# Patient Record
Sex: Male | Born: 1940 | Race: White | Hispanic: No | Marital: Single | Smoking: Former smoker
Health system: Southern US, Community
[De-identification: ages and names within clinical notes are randomized; demographics above are authoritative.]

## PROBLEM LIST (undated history)

## (undated) DIAGNOSIS — J309 Allergic rhinitis, unspecified: Secondary | ICD-10-CM

## (undated) DIAGNOSIS — K219 Gastro-esophageal reflux disease without esophagitis: Secondary | ICD-10-CM

## (undated) DIAGNOSIS — H409 Unspecified glaucoma: Secondary | ICD-10-CM

## (undated) DIAGNOSIS — D701 Agranulocytosis secondary to cancer chemotherapy: Secondary | ICD-10-CM

## (undated) DIAGNOSIS — IMO0001 Reserved for inherently not codable concepts without codable children: Secondary | ICD-10-CM

## (undated) DIAGNOSIS — K810 Acute cholecystitis: Secondary | ICD-10-CM

## (undated) DIAGNOSIS — J189 Pneumonia, unspecified organism: Secondary | ICD-10-CM

## (undated) DIAGNOSIS — J449 Chronic obstructive pulmonary disease, unspecified: Secondary | ICD-10-CM

## (undated) DIAGNOSIS — I10 Essential (primary) hypertension: Secondary | ICD-10-CM

## (undated) DIAGNOSIS — C3490 Malignant neoplasm of unspecified part of unspecified bronchus or lung: Secondary | ICD-10-CM

## (undated) DIAGNOSIS — N400 Enlarged prostate without lower urinary tract symptoms: Secondary | ICD-10-CM

## (undated) DIAGNOSIS — F32A Depression, unspecified: Secondary | ICD-10-CM

## (undated) DIAGNOSIS — N390 Urinary tract infection, site not specified: Secondary | ICD-10-CM

## (undated) DIAGNOSIS — M1990 Unspecified osteoarthritis, unspecified site: Secondary | ICD-10-CM

## (undated) DIAGNOSIS — T451X5A Adverse effect of antineoplastic and immunosuppressive drugs, initial encounter: Secondary | ICD-10-CM

## (undated) DIAGNOSIS — F1110 Opioid abuse, uncomplicated: Secondary | ICD-10-CM

## (undated) DIAGNOSIS — D649 Anemia, unspecified: Secondary | ICD-10-CM

## (undated) DIAGNOSIS — F329 Major depressive disorder, single episode, unspecified: Secondary | ICD-10-CM

## (undated) HISTORY — DX: Benign prostatic hyperplasia without lower urinary tract symptoms: N40.0

## (undated) HISTORY — PX: EYE SURGERY: SHX253

## (undated) HISTORY — DX: Chronic obstructive pulmonary disease, unspecified: J44.9

## (undated) HISTORY — DX: Gastro-esophageal reflux disease without esophagitis: K21.9

## (undated) HISTORY — PX: HERNIA REPAIR: SHX51

## (undated) HISTORY — DX: Essential (primary) hypertension: I10

## (undated) HISTORY — PX: INGUINAL HERNIA REPAIR: SUR1180

---

## 1998-04-22 ENCOUNTER — Other Ambulatory Visit: Admission: RE | Admit: 1998-04-22 | Discharge: 1998-04-22 | Payer: Self-pay | Admitting: *Deleted

## 1999-08-25 ENCOUNTER — Encounter: Payer: Self-pay | Admitting: *Deleted

## 1999-08-25 ENCOUNTER — Ambulatory Visit (HOSPITAL_COMMUNITY): Admission: RE | Admit: 1999-08-25 | Discharge: 1999-08-25 | Payer: Self-pay | Admitting: *Deleted

## 2000-05-01 ENCOUNTER — Inpatient Hospital Stay (HOSPITAL_COMMUNITY): Admission: EM | Admit: 2000-05-01 | Discharge: 2000-05-02 | Payer: Self-pay | Admitting: Emergency Medicine

## 2000-05-01 ENCOUNTER — Encounter: Payer: Self-pay | Admitting: *Deleted

## 2000-05-02 ENCOUNTER — Inpatient Hospital Stay (HOSPITAL_COMMUNITY): Admission: AD | Admit: 2000-05-02 | Discharge: 2000-05-04 | Payer: Self-pay | Admitting: Psychiatry

## 2001-01-12 ENCOUNTER — Ambulatory Visit (HOSPITAL_COMMUNITY): Admission: RE | Admit: 2001-01-12 | Discharge: 2001-01-12 | Payer: Self-pay | Admitting: Cardiovascular Disease

## 2001-05-20 ENCOUNTER — Encounter: Payer: Self-pay | Admitting: *Deleted

## 2001-05-20 ENCOUNTER — Emergency Department (HOSPITAL_COMMUNITY): Admission: EM | Admit: 2001-05-20 | Discharge: 2001-05-20 | Payer: Self-pay | Admitting: *Deleted

## 2002-03-14 ENCOUNTER — Encounter: Admission: RE | Admit: 2002-03-14 | Discharge: 2002-03-14 | Payer: Self-pay | Admitting: *Deleted

## 2002-03-18 ENCOUNTER — Encounter: Admission: RE | Admit: 2002-03-18 | Discharge: 2002-03-18 | Payer: Self-pay | Admitting: *Deleted

## 2003-11-01 HISTORY — PX: ANKLE SURGERY: SHX546

## 2004-02-23 ENCOUNTER — Ambulatory Visit (HOSPITAL_BASED_OUTPATIENT_CLINIC_OR_DEPARTMENT_OTHER): Admission: RE | Admit: 2004-02-23 | Discharge: 2004-02-23 | Payer: Self-pay | Admitting: Orthopedic Surgery

## 2004-02-23 ENCOUNTER — Ambulatory Visit (HOSPITAL_COMMUNITY): Admission: RE | Admit: 2004-02-23 | Discharge: 2004-02-23 | Payer: Self-pay | Admitting: Orthopedic Surgery

## 2004-12-07 ENCOUNTER — Encounter: Admission: RE | Admit: 2004-12-07 | Discharge: 2004-12-31 | Payer: Self-pay | Admitting: Orthopedic Surgery

## 2005-12-30 ENCOUNTER — Ambulatory Visit (HOSPITAL_COMMUNITY): Admission: RE | Admit: 2005-12-30 | Discharge: 2005-12-30 | Payer: Self-pay | Admitting: Family Medicine

## 2006-08-22 ENCOUNTER — Inpatient Hospital Stay (HOSPITAL_COMMUNITY): Admission: EM | Admit: 2006-08-22 | Discharge: 2006-08-24 | Payer: Self-pay | Admitting: Emergency Medicine

## 2008-11-11 ENCOUNTER — Ambulatory Visit (HOSPITAL_COMMUNITY): Admission: RE | Admit: 2008-11-11 | Discharge: 2008-11-11 | Payer: Self-pay | Admitting: Family Medicine

## 2008-11-11 ENCOUNTER — Ambulatory Visit: Payer: Self-pay | Admitting: Vascular Surgery

## 2008-11-11 ENCOUNTER — Encounter (INDEPENDENT_AMBULATORY_CARE_PROVIDER_SITE_OTHER): Payer: Self-pay | Admitting: Orthopedic Surgery

## 2010-11-22 LAB — SURGICAL PCR SCREEN: Staphylococcus aureus: NEGATIVE

## 2010-11-22 LAB — CBC
HCT: 39.6 % (ref 39.0–52.0)
RBC: 4.17 MIL/uL — ABNORMAL LOW (ref 4.22–5.81)
RDW: 13.3 % (ref 11.5–15.5)
WBC: 6.1 10*3/uL (ref 4.0–10.5)

## 2010-11-22 LAB — BASIC METABOLIC PANEL
Chloride: 104 mEq/L (ref 96–112)
GFR calc non Af Amer: >60 mL/min (ref >60)
Glucose, Bld: 83 mg/dL (ref 70–99)
Potassium: 4.6 mEq/L (ref 3.5–5.1)
Sodium: 136 mEq/L (ref 135–145)

## 2010-11-25 ENCOUNTER — Inpatient Hospital Stay (HOSPITAL_COMMUNITY)
Admission: RE | Admit: 2010-11-25 | Discharge: 2010-11-26 | Payer: Self-pay | Source: Home / Self Care | Attending: Urology | Admitting: Urology

## 2010-11-25 HISTORY — PX: PROSTATE SURGERY: SHX751

## 2010-11-25 LAB — BASIC METABOLIC PANEL
CO2: 28 mEq/L (ref 19–32)
Calcium: 8.7 mg/dL (ref 8.4–10.5)
GFR calc Af Amer: >60 mL/min (ref >60)
GFR calc non Af Amer: >60 mL/min (ref >60)
Potassium: 4.7 mEq/L (ref 3.5–5.1)
Sodium: 140 mEq/L (ref 135–145)

## 2010-12-27 NOTE — Op Note (Signed)
NAME:  Tyler [name], Tyler [name]             ACCOUNT NO.:  [id number]  MEDICAL RECORD NO.:  [id number]          PATIENT TYPE:  INP  LOCATION:  0001                         FACILITY:  [facility]  PHYSICIAN:  Abisola Carrero I. Patsi Sears, M.D.DATE OF BIRTH:  [birth year]  DATE OF PROCEDURE:  11/25/2010 DATE OF DISCHARGE:                              OPERATIVE REPORT   PREOPERATIVE DIAGNOSIS:  Severe symptomatic benign prostatic hypertrophy with large nest of penile superficial warts.  POSTOPERATIVE DIAGNOSIS:  Severe symptomatic benign prostatic hypertrophy with large nest of penile superficial warts.  OPERATION: 1. Excision of penile base warts (6 cm). 2. Cystoscopy. 3. Transurethral resection of prostate.  SURGEON:  Rannie Craney I. Patsi Sears, M.D.  ANESTHESIA:  General LMA (attempted and failed spinal).  PREPARATION:  After appropriate preanesthesia, the patient was brought to the operating room, placed on the operating room table in the left lateral decubitus position where spinal anesthetic was attempted, but failed.  He was therefore replaced in the dorsal supine position where general LMA anesthesia was introduced.  Following this, he was replaced in dorsal lithotomy position where the pubis was prepped with Betadine solution and draped in usual fashion.  HISTORY:  The patient is a 70 year old male with a history of severe BPH, on Flomax, but with lower urinary tract symptoms (LUTS) of frequency, urgency, poor voiding pattern, with dribbling, nighttime urination.  He has an international prostate symptom score of 25/7 (max 35).  The patient, in addition, has a large nest of penile warts, at the base of the penis in the dorsal surface and these will be for removal as well.  PROCEDURE:  Attention was first directed to the large area of penile warts measured 6 cm.  Elliptical incision was made from the base of the penis and the entire area of warts was removed and sent to laboratory for  examination.  Minimal bleeding was noted and this was contained with electrosurgical unit.  The wound was closed in 2 layers with 3-0 Vicryl suture in the subcutaneous layer and running 4-0 Vicryl suture was placed.  Dermabond was placed on the skin.  Attention was then directed to the TURP.  The urethra was dilated to a size 30-French, allowing a 28 resectoscope sheath to be passed.  A bilobar BPH was identified.  No median lobe was noted.  Cystoscopy revealed normal clear efflux in both ureteral orifices.  The trigone was normal.  There was no bladder stone or tumor noted.  Trabeculation was noted.  There was no cellule formation and no diverticular formation.  Resection was begun at 7 o'clock position and carried to the 5 o'clock position.  Resection was also accomplished from the 9 o'clock position to the 6 o'clock position and from the 3 to the 6 o'clock position. Tissue was electrocoagulated to avoid bleeding.  Tissue chips were evacuated from the bladder with the piston syringe and then a size 24 hematuria catheter was placed potential for traction irrigation.  The patient is allergic to Toradol, so he was not given any of this medication, was awakened and taken to recovery room in good condition.     Bosten Newstrom I. Patsi Sears, M.D.  SIT/MEDQ  D:  11/25/2010  T:  11/25/2010  Job:  [id number]  Electronically Signed by Jethro Bolus M.D. on 12/27/2010 09:33:14 AM

## 2010-12-27 NOTE — H&P (Signed)
  NAME:  Tyler [name], Tyler [name]             ACCOUNT NO.:  [id number]  MEDICAL RECORD NO.:  [id number]          PATIENT TYPE:  INP  LOCATION:  0001                         FACILITY:  [facility]  PHYSICIAN:  Kayte Borchard I. Patsi Sears, M.D.DATE OF BIRTH:  12-Mar-1941  DATE OF ADMISSION:  11/25/2010 DATE OF DISCHARGE:                             HISTORY & PHYSICAL   HISTORY:  Tyler [name] is a 70 year old male, with a history of BPH failing tamsulosin therapy.  He complains of severe urgency, frequency, weak urinary flow and nocturia x4.  He has an International Prostate Symptom Score sheet of 31/7 with the quality of life index of 6/6, despite maximal medical therapy.  He denies hematuria or dysuria.  Note, the patient has chronic low back pain with arthritis.  He takes ibuprofen and hydrocodone 3 times a day.  His PSA in April was 0.39 per Dr. Feliciana Rossetti.  PAST HISTORY: 1. Arthritis. 2. GERD. 3. Depression. 4. Hypertension. 5. Increased alcohol use.  PAST SURGERIES: 1. Ankle surgery. 2. Hernia repair. 3. Spermatic cord repair for varicocele in 1998.  CURRENT MEDICATIONS:  Include: 1. Atenolol 50 mg per day. 2. Citalopram 20 mg per day. 3. Hydrocodone 10/650 three times a day. 4. Ibuprofen b.i.d. 5. Omeprazole 20 mg per day. 6. Prazosin 1 mg per day. 7. Tamsulosin 0.4 mg p.o. per day.  ALLERGIES: 1. TORADOL (hives, itching and swelling. 2. CODEINE (itching). 3. TRAMADOL (itching and swelling). 4. TUSSIONEX SYRUP.  FAMILY HISTORY:  Significant for liver disease (father), coronary artery disease (mother).  The patient has 1 son and 1 daughter.  He is unmarried at this time.  SOCIAL HISTORY:  The patient drinks at least 5 beers per day.  He drinks caffeine.  He is currently divorced and on disability.  He has greater than 30-pack-year history of smoking.  REVIEW OF SYSTEMS:  CONSTITUTIONAL:  Significant for urinary frequency, urgency, nocturia, difficulty starting his urinary  stream, intermittency, incomplete emptying, postvoid dribbling, inguinal pain. No dysuria, no incontinence.  Remaining review of systems is significant for chronic back pain, general ill feeling, general malaise, depression. The patient has no chronic bronchitis, no cough, no hemoptysis.  PHYSICAL EXAMINATION:  GENERAL:  A thin active male in no acute distress. ADMISSION VITAL SIGNS:  Show blood pressure 151/69, temperature 98.3, pulse is 57. NECK:  Supple, nontender.  No nodes. CHEST:  Clear to P and A. ABDOMEN:  Soft.  Positive bowel sounds without organomegaly and without masses. GENITOURINARY:  Shows normal male external genitalia. RECTAL:  Shows 4+ firm prostate but no masses and no blood. EXTREMITIES:  No cyanosis, no edema. PSYCHOLOGIC:  Shows normal orientation to time, person and place.  PLAN:  Admission chest x-ray shows left hilar mass, the patient will need chest CT with contrast for evaluation.  He is admitted via the operating room for TURP and removal of a large nest of penile wartsidentified at the base of the penis.     Gayanne Prescott I. Patsi Sears, M.D.     SIT/MEDQ  D:  11/25/2010  T:  11/25/2010  Job:  [id number]  Electronically Signed by Jethro Bolus M.D. on 12/27/2010 09:33:18 AM
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
## 2011-03-18 NOTE — H&P (Signed)
[place]  Patient:    Tyler [name], Tyler [name]                    MRN: [id number] Adm. Date:  [id number] Disc. Date: [id number] Attending:  Beryle Beams                         History and Physical  CHIEF COMPLAINT: Overdose and alcohol intoxication.  HISTORY OF PRESENT ILLNESS: This patient is a 70 year old white male, well known to me through primary care, with a history of hypertension and chronic inguinal pain, who presented on admission drunk following an overdose.  They had multiple bottles but the only thing missing from the bottles they had was Biaxin.  There were no narcotic bottles and is is likely benzodiazepine since he was found earlier to have had multiple prescriptions for benzodiazepines through multiple doctors.  I had cancelled my prescriptions for him in the past for benzodiazepines.  He denies any other complaints on admission.  He is quite inebriated and not very talkative.  He has an NG tube in place.  He denies any chest pain, shortness of breath, cold symptoms, nausea, vomiting, abdominal pain, cough, constipation, diarrhea, fever, leg cramps, or back pain.  He states that he will take care of things and will not discuss it with me.  According to EMS and his girlfriend he had told her he did not want to live another year and that this would be his last birthday (today is his birthday).  In the ER the patient received gastric lavage and charcoal.  It is still unclear, though, what his overdose medications were.  PAST MEDICAL HISTORY:  1. Hypertension.  2. Chronic inguinal pain.  3. Alcohol dependence.  4. Depression.  5. Overuse of benzodiazepines.  MEDICATIONS: He said he was not taking anything at the time other than:  1. Biaxin.  2. Tylenol.  PRESCRIBED MEDICATIONS:  1. Zyrtec.  2. Effexor XR.  3. Phenergan as-needed.  ALLERGIES: CODEINE causes nausea. SOCIAL HISTORY: The patient is recently separated from  his second wife.  Still working at a gas station.  No smoking but alcohol and drug use.  Medical drug overuse.  No IV drugs.  FAMILY HISTORY: Heart disease.  REVIEW OF SYSTEMS: As per the HPI, otherwise negative.  PHYSICAL EXAMINATION:  VITAL SIGNS: Blood pressure 135/76, pulse rate 132 and regular, respirations 18, temperature 96.1 degrees.  Pulse oximetry 98% on room air.  HEENT: Head normocephalic, atraumatic.  EOMI.  PERRLA.  Posterior pharynx clear.  Normal TMs.  NECK: Supple without adenopathy, JVD, thyromegaly, or bruits.  CHEST: Clear to auscultation and percussion, with decreased breath sounds at bilateral bases.  CARDIOVASCULAR: Regular rate without murmurs, rubs, or gallops.  ABDOMEN: Soft, nontender, nondistended.  Bowel sounds positive without hepatosplenomegaly or mass.  EXTREMITIES: Without clubbing, cyanosis, or edema.  NEUROLOGIC: Cranial nerves 2-12 intact.  Slightly sleepy.  Alert and oriented x 3.  Contracts for safety but question his reliability.  LABORATORY DATA: Laboratories are pending on admission.  ASSESSMENT/PLAN: This patient is an unfortunate 70 year old white male who overdosed on unknown substances and alcohol overnight.  He has contracted for safety with questionable reliability.  Will admit for one-on-one observation and psychiatric evaluation.  I think he will need inpatient treatment following this due to his history of alcohol use, failure to stay with the AA meetings, and now the overdose with unknown medications.  Will admit to  telemetry and follow for any arrhythmia since unknown substances, with toxicology pending.  Chest x-ray showed a slight infiltrate, likely aspiration.  Will get a better look at it with PA and lateral films and consider intravenous antibiotics.  Will hydrate the patient and keep the NG tube in overnight and remove if the patient is breathing okay in the morning and try diet. DD:  05/23/00 TD:  05/24/00 Job:  [id number]

## 2011-03-18 NOTE — Discharge Summary (Signed)
[facility]  Patient:    Tyler [name], Tyler [name]                    MRN: [id number] Adm. Date:  [id number] Disc. Date: [id number] Attending:  Marlyn Corporal Fabmy                           Discharge Summary  ADMISSION DIAGNOSES: Axis I:    1. Major depression, recurrent.            2. Alcohol dependence. Axis II:   None. Axis III:  History of hiatal hernia, low back pain, questionable lung            infiltrate. Axis IV:   Severe, psychosocial. Axis V:    GAF admission 40 and highest in past year 80.  DISCHARGE DIAGNOSES: Axis I:    1. Major depression, recurrent.            2. Alcohol dependence. Axis II:   None. Axis III:  History of hiatal hernia and low back pain. Axis IV:   Severe, psychosocial. Axis V:    GAF of 40 on admission and 70-80 on discharge.  BRIEF HISTORY:  The patient is a 70 year old three times divorced, Caucasian male who was admitted voluntarily for detoxification.  Patient was referred from [facility] and had a recent inpatient stabilization, status post overdose on opiates, Xanax, and alcohol.  He took the overdose on July 2.  On admission, the patient reported increasing depression with escalating alcohol abuse since breaking up with his girlfriend five months ago.  He also indicated that he had been having mounting financial pressures contributing to his depression.  Prior to his admission, his ability to be detoxified on outpatient service was questioned, because he has a history of multiple DWIs, detox visits at [facility] in the past, refractory to outpatient regimen.  The patient in the past refused counseling.  He has been known to abuse benzodiazepines, specifically Xanax and alcohol, obtaining Xanax from different physicians and not returning for follow-up.  It was felt that his suicide attempt and combination of opiates, Xanax, and alcohol was serious. Again, prior to his admission he was stating that he  did not want to live anymore and did not want to have any more birthdays and that he wanted this to be his last birthday.  However, he showed some indication to involve himself in counseling, abstain from the use of alcohol and wean off Xanax, and _________ and he was admitted for detoxification and management of his depression.  PERTINENT PHYSICAL AND LABORATORY DATA:  Patient was fully evaluated prior to his admission.  However, we ran a thyroid profile which showed values to be within the normal range.  COURSE IN HOSPITAL:  Patients detoxification progressed unremarkably.  There was no evidence of any withdrawal symptoms.  His affect at the time of his discharge was full and his mood was euthymic.  He was sleeping through the night.  He denied any suicidal or homicidal strivings.  He was medically compliant throughout his hospitalization.  CONDITION ON DISCHARGE:  The patient plans to stay sober and attend church and involve himself with his two grandchildren.  MEDICATIONS/FOLLOW-UP: 1. Patient was discharged with continued taper of Librium. 2. Celexa 20 mg q.d. 3. Trazodone 50 mg h.s. 4. Levaquin 500 mg q.d. for seven days.  He [facility].DD:  06/19/00 TD:  06/19/00  Job: [id number]
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
## 2011-03-18 NOTE — Discharge Summary (Signed)
Tyler [name], [name]             ACCOUNT NO.:  [id number]   MEDICAL RECORD NO.:  [id number]          PATIENT TYPE:  INP   LOCATION:  3030                         FACILITY:  [facility]   PHYSICIAN:  Hillery Aldo, M.D.   DATE OF BIRTH:  06/25/1941   DATE OF ADMISSION:  08/22/2006  DATE OF DISCHARGE:  08/24/2006                                 DISCHARGE SUMMARY   PRIMARY CARE PHYSICIAN:  Windle Guard, M.D.   DISCHARGE DIAGNOSES:  1. 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  2. Hypertension.  3. Questionable alcohol abuse.  4. Tobacco abuse  5. Chronic low back pain.  6. History of depression.  7. Gastroesophageal reflux disease.   DISCHARGE MEDICATIONS:  1. Nexium 40 mg daily.  2. Atenolol 50 mg daily.  3. Vicodin 5/325 1 tablet q.4 h p.r.n. breakthrough pain.  4. Ibuprofen 800 mg t.i.d. x2 weeks then p.r.n..  5. Flexeril 10 mg t.i.d.   CONSULTATIONS:  Dr. Coletta Memos, M.D. - Neurosurgery.   BRIEF ADMISSION HPI:  The patient is a 70 year old male with a past medical  history of neck and back pain who presents with an acute onset of an  exacerbation of severe neck pain with nuchal rigidity and headache.  He was  evaluated in the emergency department with a CT scan and lumbar puncture.  He was admitted for further evaluation and pain control.   PROCEDURES AND DIAGNOSTIC STUDIES:  1. CT scan of the head on 08/22/2006 showed no acute or focal intracranial      findings with no evidence for subarachnoid hemorrhage.  2. MRI of the C-spine on 08/22/2006 showed multilevel foraminal narrowing      due to uncovertebral spurring and facet hypertrophy.  This was most      prominent on the left at C3-C4 and C4-C5 and bilaterally at C5-C6 and      to a lesser extent at C6-C7.   DISCHARGE LABORATORY VALUES:  Cerebrospinal fluid studies were unremarkable.  No evidence for infection.   HOSPITAL COURSE:  1. Headache with  neck pain:  The patient was admitted with presumptive      diagnosis of an acute exacerbation of chronic neck pain due to      multilevel degenerative joint disease.  Nevertheless, due to his      headache with nuchal signs, a CT scan of the head and an LP was done to      rule out subarachnoid hemorrhage and meningitis.  These were      effectively ruled out with diagnostic testing as outlined above.  He      did have a cervical MRI scan done with findings as noted above.  He was      seen in consultation by Dr. Franky Macho of Neurosurgery who felt that a      conservative course of therapy would be warranted.  He has a follow-up      appointment scheduled with Dr. Franky Macho on a p.r.n. basis if his      symptoms do not adequately  resolve with conservative treatment, which      include pain medications, anti-inflammatory medications as well as      muscle relaxants.  He continues to wear soft neck collar for comfort      purposes and to help with pain control.  He was instructed to follow up      with his primary care physician next week and to call Dr. Sueanne Margarita      office for an appointment if his neck pain does not adequately resolve      with conservative measures.  2. Hypertension.  The patient has had reasonably good control on his      outpatient antihypertensive medication.  He was discharged on the same.  3. Question of alcohol abuse:  The patient did not exhibit any outward      signs of alcohol withdrawal during the course of his hospitalization.  4. Tobacco abuse:  The patient was counseled on tobacco cessation.  He was      encouraged to quit.   DISPOSITION:  The patient will be discharged home.   CONDITION ON DISCHARGE:  Improved.      Hillery Aldo, M.D.  Electronically Signed     CR/MEDQ  D:  08/24/2006  T:  08/25/2006  Job:  [id number]

## 2011-03-18 NOTE — H&P (Signed)
[facility]  Patient:    Tyler [name], Tyler [name]                    MRN: [id number] Adm. Date:  [id number] Attending:  Marlyn Corporal Fabmy Dictator:   Eduard Roux, NP                   Psychiatric Admission Assessment  DATE OF ADMISSION:  May 02, 2000  PATIENT IDENTIFICATION:  Mr. Tyler [name] is a 70 year old divorced white male voluntarily admitted on referral from [facility] as well as recent inpatient stabilization status post overdose on opiates and Xanax and alcohol overdose on May 01, 2000.  HISTORY OF PRESENT ILLNESS:  Patient reports increasing depression with escalating alcohol abuse since breaking up with his girlfriend five months ago.  He also states he has been having mounting financial pressures.  Patient reports that he was intoxicated at the time he took multiple pills, both of Xanax and opiates and cannot recall the other types.  He also reports stopping his Effexor approximately two months ago.  Patient has been drinking 6-7 beers a day with a maximum of 20 a day for a long period of time, 40 plus years.  He has been obtaining Xanax according to his medical doctor from other physicians and doctor shopping.  He has had recent dental work where he was able to obtain opiates and this was part of his overdose.  Patient presents today regretting his suicide attempt.  He is denying any current suicidal ideation and states he is very embarrassed.  He does acknowledge that he has been depressed for some time, and describes himself as having "bad nerves."  He has no history of auditory and visual hallucinations and does not report any obsessive compulsive traits.  He has had decreased sleep over the past several months, with frequent awakening and decreased p.o. intake with weight loss; however, this appears to be deliberate.  PAST PSYCHIATRIC HISTORY:  He has one inpatient admission for detox approximately 13 years ago at  [facility] in [place].  Initially, it was noted that he denied any previous suicide attempts, however later he does acknowledge that he made an attempt or "thought about it" twelve years ago.  Patient minimizes this and states "I dont really count this."  Patient received a psychiatry consult via Dr. Jeanie Sewer on May 02, 2000.  Please see consult in chart.  Patient has a history of attentional problems, poor school performance, no diagnosis of attention deficit disorder or medications. Previous medication trials are Elavil and Effexor.  PAST MEDICAL HISTORY:  Primary care Garl Speigner is Dr. Theda Belfast.  Medical problems:  Inguinal hernia repair in 1998, chronic back pain, history of frequent pneumonia, abnormal chest x-ray in the past.  Medications:  Patient at home was on Prevacid 30 mg q.d. and Vioxx 25 mg q.d.  He was prescribed at recent inpatient stabilization at [facility] Pepcid 20 mg b.i.d., Celexa 20 mg q.a.m., levaquin 500 mg q.d.  DRUG ALLERGIES:  Toradol and CODEINE.  PHYSICAL EXAMINATION:  Physical examination and medical stabilization was provided inpatient at [facility] on May 01, 2000.  Chest x-ray on May 01, 2000 questionable infiltrate.  He had an EKG  performed that had a normal sinus rhythm, however it reflected a prolonged QT interval.  LABORATORY DATA:  On July 2, urinalysis was within normal limits.  Blood alcohol level at that time was 176.  Acetosalic acid level was less  than 4. His urine drug screen was positive for opiates.  July 3 laboratory values, CBC was within normal limits, CMET increased glucose at 119, otherwise within normal limits.  VITAL SIGNS:  Stable since admission to the unit at 127/74, 115, 75 and 98.9.  SOCIAL HISTORY:  Patient has been married three times.  He has two adult children ages 54 and 79. He lives alone, but up until five months ago had a girlfriend.  He has a history of three DUIs, last one 12 years ago, and he  is employed at [employer].  FAMILY HISTORY:  He states his father was an alcoholic and "had nerve problems."  Does not know of any other family history of mental illness.  ALCOHOL AND DRUG HISTORY:  He is drinking approximately 6-7 beers a day with a maximum of 20 a day for 40+ years.  He does have one inpatient detox approximately 13 years ago.  The longest period of sobriety is 15 months.  He denies any history of alcohol related seizures or DTs.  MENTAL STATUS EXAMINATION:  Patient is a middle-aged white male.  He is very cooperative and pleasant.  His speech is normal rate and tone and relevant. His mood is depressed.  His affect is constricted.  His thought processes are coherent without evidence of psychoses.  He is denying any current suicidal ideation.  As noted in HPI, he took an overdose on May 01, 2000 and which he acknowledges was a suicidal gesture.  He has no history of auditory and visual hallucinations, no homicidal ideation.  His cognitive function appears to be intact.  He is alert and oriented x 4.  He is presenting with limited insight and judgment.  ADMISSION DIAGNOSES: Axis I:    1. Major depression, recurrent.            2. Alcohol dependency. Axis II:   None. Axis III:  History of inguinal hernia, low back pain, questionable            lung infiltrate. Axis IV:   Severe, related to problems with primary support group and other            psychosocial problems with alcohol dependency. Axis V:    Current global assessment of function is 40, highest past year 80.  TREATMENT PLAN AND RECOMMENDATIONS:  We will voluntarily admit Mr. Hogen to [facility] for stabilization and detox from alcohol. Fifteen minute checks will be provided for safety.  We will implement a low-Librium protocol with elimination of a loading dose to address his alcohol dependency.  Resume Celexa 20 mg q.d. as an antidepressant, Protonix 40 mg q.d., Levaquin 500  mg x 9 days, Trazodone 50 mg q.h.s. p.r.n.  Pending laboratory values at the time of this dictation is TSH, T4-T3.   ESTIMATED LENGTH OF STAY:  Two to three days with follow up at [facility]. DD:  05/03/00 TD:  05/03/00 Job: [id number]

## 2011-03-18 NOTE — Discharge Summary (Signed)
[place]  Patient:    Tyler [name], Tyler [name]                    MRN: [id number] Adm. Date:  [id number] Disc. Date: [id number] Attending:  Marlyn Corporal Fabmy                           Discharge Summary  DISCHARGE DIAGNOSES: 1. Overdose of narcotics and other unknown medications. 2. Acute alcohol intoxication. 3. Suicidal. 4. Depression. 5. Aspiration pneumonitis. 6. Hypertension. 7. Chronic inguinal pain.  HISTORY OF PRESENT ILLNESS:  This is a 70 year old white male with the following problems, who presented drunk with apparent overdose.  His toxicology screen was positive for narcotics, but he has no known narcotic prescriptions given.  He was also intoxicated with alcohol.  He had stated that this was a serious attempt with full intent to die.  HOSPITAL COURSE:  Adelene Amas. [place], M.D., saw him initially and thought that he could have an outpatient rehabilitation program and be off one to one.  I spoke with him following that and explained him the serious concern of the tenuous medical status of Tyler [name].  I questioned his reliability to go to outpatient services in light of his history that includes multiple DWIs, detoxification, visited [facility], refractory to operating table, refusing counseling, noncompliance with medications, abusing both benzodiazepines and alcohol, obtaining Xanax from different physicians, and not returning for follow-up.  I am quite concerned that he is an extremely high level of risk and is not safe by himself.  After discussion of this new information, Dr. Jeanie Sewer, was in agreement that he needed inpatient admission for depression, multiple chemical dependence, and suicidal behavior. I explained this to the patient and told him that I was ready to do the first medical for commitment if necessary.  After a lengthy discussion with the patient, he agreed for voluntary commitment for  detoxification.  DISPOSITION:  Arrangements were made for him to be transferred to the [facility] for placement.  DISCHARGE MEDICATIONS:  He is to continue on his antibiotics and any other medications they thought necessary for detoxification.  FOLLOW-UP:  The patient will follow up with me following his discharge from the inpatient program. DD:  05/23/00 TD:  05/25/00 Job: [id number]

## 2011-03-18 NOTE — Consult Note (Signed)
Tyler [name], Tyler [name]             ACCOUNT NO.:  [id number]   MEDICAL RECORD NO.:  [id number]          PATIENT TYPE:  INP   LOCATION:  3030                         FACILITY:  [facility]   PHYSICIAN:  Coletta Memos, M.D.     DATE OF BIRTH:  [birth year]   DATE OF CONSULTATION:  08/24/2006  DATE OF DISCHARGE:  08/24/2006                                   CONSULTATION   REASON FOR CONSULTATION:  Neck pain.   INDICATIONS:  Tyler [name] is a 70 year old gentleman who said he had  the worst pain of his life in his neck on Tuesday night, the day of  admission, August 22, 2006.  He awoke and was unable to turn his head  secondary to severe pain.  When speaking to he and his sister today for the  consultation, he has had pain in his neck for well over a year.  He was sent  for physical therapy approximately a year ago by Dr. Turner Daniels, who had operated  on him for a foot problem, for the neck pain.  He thinks that the physical  therapy did help.  But his sister states that he has had this type of pain  where he screamed out for now about a year.  It was never as bad as was this  past Tuesday.  He has been in the hospital and on pain medications and has  received a number of radiologic studies.  He has already been cleared for  subarachnoid hemorrhage.  Head CT showed no blood and the lumbar puncture  did not reveal subarachnoid blood.  He had an MRI of the cervical spine  which showed confirmation of the plain films, that being cervical  degenerative disc disease at multiple levels, facet arthropathy at C4-C5, C5-  C6, and C6-C7.  He has no spinal cord abnormalities.  He has no bony  abnormalities besides the spondylitic change.  He has no intracranial  abnormalities on the CT scan.   He has had some problems with urinary flow now for quite some time.  He was  placed on Flomax by Dr. Vonita Moss in the past.  He said it helped, but after  he took the 30 day supply he stopped taking it and  never  went back to see  Dr. Vonita Moss.  He has no new bowel or bladder problems.  He simply has not  used the bathroom since Monday.  He has had no blurred vision.  No other  neurologic problems.   PAST MEDICAL HISTORY:  Significant for alcohol abuse, hypertension, left  inguinal herniorrhaphy, depression, smoking, chronic low back pain,  gastroesophageal reflux disease, and a right ankle fusion two years ago.   MEDICATIONS ON ADMISSION:  Atenolol, hydrocodone, Nexium.  His current  medication regimen also includes Flexeril, nicotine patch, vitamin B1, and a  sleeping pill as needed.   ALLERGIES:  CODEINE and TORADOL, both of which cause him to itch.   REVIEW OF SYSTEMS:  Outside of those which have already been mentioned in  the history of present illness, he denies constitutional, eyes, ears, nose,  or  mouth, cardiovascular, respiratory, skin, neurologic, psychiatric,  endocrine, and hematologic problems.   PHYSICAL EXAMINATION:  Temperature 96.8, pulse of 57, respiratory rate 18,  blood pressure 142/80, oxygen saturation on air of 94%.  He is alert and  oriented x4 and answers all questions appropriately.  Memory, language,  attention span, and fund of knowledge is normal.  He is well kempt lying on  his bed.  He is not in distress but he does flinch and yell out occasionally  when speaking because he says his neck hurts.  When I examined his neck  externally, there are no abnormalities.  When I very lightly touch his neck,  he jumps up and screams and pain.  He holds his neck tilted towards the left  and slightly turned towards his left shoulder.  He has full strength in the  upper and lower extremities.  Normal muscle tone, bulk, and coordination.  Intact proprioception. Intact light touch in the upper and lower  extremities.  Reflexes 2+ at the biceps, triceps, brachial radialis, knees  and ankles.  No clonus.  No Hoffman sign.  Toes are downgoing to plantar  stimulation.  Full  extraocular movements.  Tongue and uvula move in the  midline.  Shoulder shrug is normal.  Hearing intact to finger rub  bilaterally.   MRI of the cervical spine is reviewed.  CT of the cervical spine is  reviewed.  He has a very large arthritic facet on the left side at C4-C5. He  has degenerative disc disease seen on both CT and the MRI film.  He does not  have spinal cord stenosis.  He has foraminal narrowing at C5-C6 and C6-C7,  also some at C4-C5.  His alignment is, otherwise, good.  No abnormalities in  the paraspinous soft tissue.  No intracranial abnormalities that I  appreciated.  The skull also appears to be normal.   DIAGNOSIS:  1. Neck pain.  2. Cervical spondylosis with myelopathy, degenerative disc disease with      myelopathy.   It is unclear to me why Mr. Niu is in such severe pain.  Certainly he  has enough arthritis that I would expect him to have some neck pain.  This  recent exacerbation may have been due to a muscle spasm.  But again, I am  just surprised that I can touch him so very lightly and he, again, literally  jumps off the bed.  I do not see how operative intervention at this point  would improve the situation.  Certainly, it is something that could be done  but I do not necessarily believe also that a good rounded conservative  treatment has been implemented at this point in time.  I recommend that he  receive physical therapy. I recommend he be placed on an anti-inflammatory  for a period of 2-4 weeks.  Most importantly, he has absolutely no arm pain  and this does not appear to be radicular at all.  The pain is not radiating.  I am happy to see him in my office if need be.           ______________________________  Coletta Memos, M.D.     KC/MEDQ  D:  08/24/2006  T:  08/25/2006  Job:  [id number]

## 2011-03-18 NOTE — Discharge Summary (Signed)
[place]  Patient:    Tyler [name], Tyler [name]                    MRN: [id number] Adm. Date:  [id number] Attending:  Koren Bound CC:         Cardiac Catheterization Laboratory  Ammie Dalton, M.D.   Discharge Summary  INDICATIONS:  Mr. Skeels is a 70 year old gentleman with a history of hypertension and past history of cigarette smoking.  He is referred for heart catheterization after having developed an onset of chest pain.  His chest pain was described as chest pressure which last for 10-15 minutes then resolved spontaneously.  DESCRIPTION OF PROCEDURE:  The right femoral artery was easily cannulated using the modified Seldinger technique.  HEMODYNAMICS:  The left ventricular pressure was 157/25 with an aortic pressure of 156/79.  ANGIOGRAPHY:  The left main coronary artery has only minor luminal irregularities but is otherwise normal.  The left anterior descending artery is a moderate sized vessel.  It has minor luminal irregularities.  The first diagonal vessel is a moderate sized vessel and is essentially normal.  The left circumflex artery gives off a large first obtuse marginal artery and then continues around the AV groove as a relatively small artery.  There are only minor luminal irregularities in the left circumflex artery and in the much large first obtuse marginal artery.  The right coronary artery is large and dominant.  There are minor luminal irregularities with tightest stenosis around 10%.  The posterior descending artery and posterolateral segment arteries are normal.  LEFT VENTRICULOGRAM:  The left ventriculogram was performed in a 30 RAO position.  It reveals normal left ventricular systolic function.  The ejection fraction is around 65%.  There is no significant mitral regurgitation.  COMPLICATIONS:  None.  CONCLUSIONS: 1. Relatively normal coronary arteries. 2. Normal left ventricular systolic  function. DD:  01/12/01 TD:  01/12/01 Job: [id number]

## 2011-08-03 ENCOUNTER — Inpatient Hospital Stay (HOSPITAL_COMMUNITY)
Admission: EM | Admit: 2011-08-03 | Discharge: 2011-08-05 | DRG: 069 | Disposition: A | Discharge status: Home / Self Care | Payer: Medicare Other | Attending: Internal Medicine | Admitting: Internal Medicine

## 2011-08-03 ENCOUNTER — Emergency Department (HOSPITAL_COMMUNITY): Payer: Medicare Other

## 2011-08-03 DIAGNOSIS — N400 Enlarged prostate without lower urinary tract symptoms: Secondary | ICD-10-CM | POA: Diagnosis present

## 2011-08-03 DIAGNOSIS — Z7982 Long term (current) use of aspirin: Secondary | ICD-10-CM

## 2011-08-03 DIAGNOSIS — Z23 Encounter for immunization: Secondary | ICD-10-CM

## 2011-08-03 DIAGNOSIS — I10 Essential (primary) hypertension: Secondary | ICD-10-CM | POA: Diagnosis present

## 2011-08-03 DIAGNOSIS — M545 Low back pain, unspecified: Secondary | ICD-10-CM | POA: Diagnosis present

## 2011-08-03 DIAGNOSIS — G459 Transient cerebral ischemic attack, unspecified: Principal | ICD-10-CM | POA: Diagnosis present

## 2011-08-03 DIAGNOSIS — F329 Major depressive disorder, single episode, unspecified: Secondary | ICD-10-CM | POA: Diagnosis present

## 2011-08-03 DIAGNOSIS — R0789 Other chest pain: Secondary | ICD-10-CM | POA: Diagnosis present

## 2011-08-03 DIAGNOSIS — K219 Gastro-esophageal reflux disease without esophagitis: Secondary | ICD-10-CM | POA: Diagnosis present

## 2011-08-03 DIAGNOSIS — Z79899 Other long term (current) drug therapy: Secondary | ICD-10-CM

## 2011-08-03 DIAGNOSIS — F3289 Other specified depressive episodes: Secondary | ICD-10-CM | POA: Diagnosis present

## 2011-08-03 DIAGNOSIS — I498 Other specified cardiac arrhythmias: Secondary | ICD-10-CM | POA: Diagnosis present

## 2011-08-03 DIAGNOSIS — G8929 Other chronic pain: Secondary | ICD-10-CM | POA: Diagnosis present

## 2011-08-03 LAB — COMPREHENSIVE METABOLIC PANEL
ALT: 15 U/L (ref 0–53)
AST: 20 U/L (ref 0–37)
Calcium: 9.5 mg/dL (ref 8.4–10.5)
GFR calc Af Amer: 86 mL/min — ABNORMAL LOW (ref >90)
Glucose, Bld: 93 mg/dL (ref 70–99)
Sodium: 139 mEq/L (ref 135–145)
Total Protein: 7.4 g/dL (ref 6.0–8.3)

## 2011-08-03 LAB — PROTIME-INR: INR: 0.99 (ref 0.00–1.49)

## 2011-08-03 LAB — CBC
HCT: 40.9 % (ref 39.0–52.0)
Hemoglobin: 14.2 g/dL (ref 13.0–17.0)
RBC: 4.36 MIL/uL (ref 4.22–5.81)
WBC: 6.7 10*3/uL (ref 4.0–10.5)

## 2011-08-03 LAB — POCT I-STAT TROPONIN I: Troponin i, poc: 0 ng/mL (ref 0.00–0.08)

## 2011-08-03 LAB — DIFFERENTIAL
Basophils Absolute: 0 10*3/uL (ref 0.0–0.1)
Lymphocytes Relative: 33 % (ref 12–46)
Neutro Abs: 3.6 10*3/uL (ref 1.7–7.7)

## 2011-08-03 LAB — APTT: aPTT: 33 seconds (ref 24–37)

## 2011-08-03 LAB — URINALYSIS, ROUTINE W REFLEX MICROSCOPIC
Hgb urine dipstick: NEGATIVE
Specific Gravity, Urine: 1.02 (ref 1.005–1.030)
Urobilinogen, UA: 0.2 mg/dL (ref 0.0–1.0)

## 2011-08-04 ENCOUNTER — Inpatient Hospital Stay (HOSPITAL_COMMUNITY): Payer: Medicare Other

## 2011-08-04 DIAGNOSIS — I6350 Cerebral infarction due to unspecified occlusion or stenosis of unspecified cerebral artery: Secondary | ICD-10-CM

## 2011-08-04 DIAGNOSIS — I059 Rheumatic mitral valve disease, unspecified: Secondary | ICD-10-CM

## 2011-08-04 LAB — TSH: TSH: 1.423 u[IU]/mL (ref 0.350–4.500)

## 2011-08-04 LAB — CARDIAC PANEL(CRET KIN+CKTOT+MB+TROPI)
CK, MB: 2.3 ng/mL (ref 0.3–4.0)
CK, MB: 2 ng/mL (ref 0.3–4.0)
Relative Index: INVALID (ref 0.0–2.5)
Total CK: 90 U/L (ref 7–232)
Total CK: 106 U/L (ref 7–232)
Troponin I: <0.3 ng/mL (ref <0.30)

## 2011-08-04 LAB — LIPID PANEL
LDL Cholesterol: 116 mg/dL — ABNORMAL HIGH (ref 0–99)
VLDL: 52 mg/dL — ABNORMAL HIGH (ref 0–40)

## 2011-08-08 NOTE — H&P (Signed)
NAMEHAMZAH, Tyler [name]             ACCOUNT NO.:  [id number]  MEDICAL RECORD NO.:  [id number]  LOCATION:  [facility]                         FACILITY:  [facility]  PHYSICIAN:  Hillery Aldo, M.D.   DATE OF BIRTH:  November 10, 1940  DATE OF ADMISSION:  08/03/2011 DATE OF DISCHARGE:                             HISTORY & PHYSICAL   PRIMARY CARE PHYSICIAN:  Dr. Quintella Reichert in [place].  CHIEF COMPLAINT:  Left arm and leg paresthesias, intermittent chest pain.  HISTORY OF PRESENT ILLNESS:  The patient is a 70 year old male with past medical history of hypertension who presents to the hospital with a chief complaint of transient left arm and left leg numbness.  The patient states that he had two episodes of paresthesias today and five episodes yesterday, each lasting less than 1 minute at a time.  He has no history of similar symptoms.  He also was concerned about chest pain. He last experienced chest pain 5 days ago.  That episode was nonexertional and short lasting.  The patient states that he is not on aspirin.  The patient also had some transient diaphoresis and dizziness with his episodes of left arm and leg paresthesias.  There are no aggravating or alleviating factors.  The patient has been referred to the Hospitalist Service for further evaluation and treatment.  PAST MEDICAL HISTORY: 1. Hypertension. 2. Gastroesophageal reflux disease. 3. Benign prostatic hypertrophy. 4. History of depression with psychiatric hospitalization for     suicidality. 5. Chronic lower back pain.  PAST SURGICAL HISTORY: 1. Right ankle fusion. 2. Hernia repair. 3. Spermatic cord repair for varicocele in 1998. 4. Transurethral resection of prostate.  FAMILY HISTORY:  The patient's father died at age 79 from pancreatic cancer.  He was an alcoholic with chronic liver disease.  The patient's mother died at 36 from complications of coronary artery disease, diabetes and hypertension.  The patient has two sisters who  are living. One has diabetes, hypertension and hyperlipidemia.  One suffers with mental problems.  SOCIAL HISTORY:  The patient is divorced and disabled.  Previously, he worked in a gas station.  He has a greater than 50 pack-year history of tobacco abuse, and quit approximately eight and half months ago.  He admits to drinking 4-6 beers per day and has a longstanding history of heavy alcohol use.  ALLERGIES:  TORADOL, CODEINE, TRAMADOL, TUSSIONEX.  CURRENT MEDICATIONS: 1. Omeprazole 20 mg p.o. b.i.d. 2. Gabapentin 100 mg p.o. q.i.d. 3. Losartan/hydrochlorothiazide 100/25 one tablet p.o. daily. 4. Endocet 10/650 one tablet p.o. q.i.d. 5. Topiramate 100 mg p.o. daily. 6. Atenolol 100 mg, 2 tablets p.o. at bedtime. 7. Hydroxyzine 25 mg p.o. q.i.d. p.r.n. pruritus. 8. Norvasc 10 mg p.o. daily. 9. Flomax 0.4 mg p.o. at bedtime.  REVIEW OF SYSTEMS:  CONSTITUTIONAL:  Positive intentional weight loss. The patient states that he is on topiramate for this purpose.  HEENT: No vision changes, no dysphagia, no speech impediments.  CARDIOVASCULAR: Positive for chest pain with last episode 5 days ago.  No dysrhythmia. RESPIRATORY:  Positive for chronic shortness of breath.  No cough.  GI: No nausea, vomiting, diarrhea, melena or hematochezia.  GU:  No dysuria. MUSCULOSKELETAL:  Positive for chronic lower back  pain and transient loss of balance.  NEUROLOGIC:  As per the HPI above.  PHYSICAL EXAMINATION:  VITAL SIGNS:  Temperature 98.4, pulse 48, respirations 16, blood pressure 132/54, O2 saturation 95% on room air. GENERAL:  Well-developed, well-nourished male in no acute distress. HEENT:  Normocephalic, atraumatic.  PERRL.  EOMI.  Oropharynx is clear. Tongue is midline, palate rises symmetrically. NECK:  Supple.  No thyromegaly, lymphadenopathy, no jugular venous distention, no carotid bruits. CHEST:  Lungs clear to auscultation bilaterally with good air movement. HEART:  Bradycardic rate,  regular rhythm.  No murmurs, rubs, or gallops. ABDOMEN:  Soft, nontender, nondistended with normoactive bowel sounds. EXTREMITIES:  No clubbing, edema, or cyanosis. SKIN:  Dry.  No rashes. NEUROLOGIC:  The patient is alert and oriented x3.  Cranial nerves II- XII grossly intact.  Possibly very subtle weakness on the left compared to the right but good strength bilaterally.  DATA REVIEW:  CT scan of the head shows no acute intracranial abnormalities.  Stable mild chronic atrophy.  Chest x-ray shows COPD without active cardiopulmonary disease.  LABORATORY DATA:  ESR is 41.  Sodium is 139, potassium 3.7, chloride 102, bicarb 28, BUN 25, creatinine 1.01, glucose 93, calcium 9.5.  Total bilirubin 0.2, alkaline phosphatase 116, AST 20, ALT 15, total protein 7.4, albumin 3.8.  PT is 13.3, PTT 33.  Troponin was zero.  White blood cell count was 6.7, hemoglobin 14.2, hematocrit 40.9, platelets 171.  ASSESSMENT AND PLAN: 1. Transient ischemic attack:  Admit the patient to telemetry.  The     patient's symptoms may be suggestive of a low flow state from     bradycardia, so we will hold all his rate controlling medications.    We will initiate a full stroke workup with two-dimensional     echocardiogram, carotid Dopplers, and MRI/MRA.  We will further     risk stratify him with a hemoglobin A1c and fasting lipid panel.     We will initiate antiplatelet therapy with aspirin. 2. Bradycardia:  Again, we will hold all rate controlling medications     and check TSH. 3. Chest pain:  This is very atypical.  We will cycle cardiac markers     q.6 h x3, recheck a 12-lead EKG in the morning, and obtain a two-     dimensional echocardiogram as part of the transient ischemic attack     workup. 4. Hypertension:  We will monitor the patient's blood pressure closely     and check orthostatics given that he is on multiple medications for     blood pressure control. 5. Gastroesophageal reflux disease:  Continue  PPI therapy. 6. Benign prostatic hypertrophy:  Continue the patient's Flomax. 7. Chronic lower back pain:  We will continue Vicodin for pain control     as needed. 8. Prophylaxis:  Initiate Lovenox for deep vein thrombosis     prophylaxis.  Time spent on admission including face-to-face time equals approximately 1 hour.     Hillery Aldo, M.D.     CR/MEDQ  D:  08/03/2011  T:  08/04/2011  Job:  [id number]  cc:   Dr. Quintella Reichert  Electronically Signed by Hillery Aldo M.D. on 08/08/2011 07:16:55 PM

## 2011-08-11 NOTE — Discharge Summary (Signed)
Tyler [name], Tyler [name]             ACCOUNT NO.:  [id number]  MEDICAL RECORD NO.:  [id number]  LOCATION:  3041                         FACILITY:  [facility]  PHYSICIAN:  Manson Passey, MD        DATE OF BIRTH:  09-11-1941  DATE OF ADMISSION:  08/03/2011 DATE OF DISCHARGE:  08/05/2011                              DISCHARGE SUMMARY   DISCHARGE MEDICATIONS: 1. Losartan/hydrochlorothiazide 100/25 mg tablet by mouth once daily. 2. Neurontin 100 mg tablet 4 times daily by mouth. 3. Topiramate 100 mg tablet 1 tablet daily. 4. Hydroxyzine 25 mg tablet every 6 h. as needed. 5. Atenolol 100 mg tablet 2 tablets by mouth nightly. 6. Norvasc 10 mg tablet by mouth daily. 7. Hydrocodone/APAP 10/650 mg tablet by mouth every 6 h. as needed for     pain. 8. Omeprazole 20 mg tablet 1 tablet twice daily by mouth. 9. Flomax 0.4 mg tablet once daily nightly. 10.Aspirin 325 mg tablet once daily.  DISCHARGE DIAGNOSES: 1. Transient ischemic attack - resolved. 2. Hypertension. 3. Benign prostatic hyperplasia. 4. Chronic lower back pain. 5. Gastroesophageal reflux disease. 6. History of depression with psychiatric hospitalizations for     suicidal ideations.  DISPOSITION AND FOLLOWUP:  The patient was discharged from the hospital in stable condition, TIA. resolved.  He will need to follow up with primary care physician in 2 weeks.  Note that, aspirin was added to the patient's home medication regimen.  DIAGNOSTIC TESTS:  MRI of the brain without contrast no acute infarct, remote small left thalamic infarct, minimal small vessel disease-type changes, intracranial atherosclerotic-type changes that was August 04, 2011.  August 03, 2011, CT of the head without contrast no acute intracranial abnormalities, stable mild cerebral atrophy.  August 03, 2011, chest x-ray emphysema without active cardiopulmonary disease.  CONSULTATIONS:  None.  HISTORY OF PRESENT ILLNESS:  The patient is very pleasant  70 year old male with medical history outlined above who presented to hospital with main concern of left arm and left leg weakness with numbness that lasted approximately 2-3 hours and has resolved continuously.  The patient states that he has had 2 episodes of procedures prior to admission and also recalls similar episodes in the past week.  Each episode lasting anywhere from few minutes to few hours and all resolving.  He has no other history of similar symptoms other than what I just mentioned.  He denies recent sicknesses or hospitalizations, denied chest pain or shortness of breath, no headaches or dizziness, no visual changes.  Also denied abdominal concerns, chest pain or palpitations.  PHYSICAL EXAMINATION ON DISCHARGE:  VITAL SIGNS:  Temperature 98.5, pulse 65, respirations 18, blood pressure 133/62 and saturating 98% on room air. GENERAL:  The patient is sitting in bed, cooperative to examination and follows commands appropriately and not in acute distress. CARDIOVASCULAR SYSTEM:  Regular rate and rhythm.  S1-S2 present.  No murmurs, rubs or gallops. LUNGS:  Clear to auscultation bilaterally.  No wheezing, rhonchi or rales. ABDOMEN:  Soft, nontender and nondistended.  Bowel sounds are present. EXTREMITIES:  No edema, clubbing or cyanosis noted. NEUROLOGIC:  The patient is alert and oriented x3, motor strength is 5/5 bilaterally throughout, sensation intact throughout,  cranial nerves II through XII grossly intact, cerebellar functions intact, blood work cardiac enzymes x3 negative.  LABORATORY DATA:  Hemoglobin A1c 6.1, LDL 116, cholesterol 194 and HDL 26.  Sodium 139, potassium 3.7, chloride 102, bicarb 28, BUN 25, creatinine 1.01, glucose 93.  LFTs within normal limits.  WBC 6.8, hemoglobin 14.2, hematocrit 40.9 and platelets 171.  HOSPITAL COURSE BY PROBLEM: 1. TIA - several different episodes per the patient's report and all     resolved.  The patient has several risk  factors including     hypertension, hyperlipidemia and smoking history.  He has responded     well to supportive care during the hospitalization.  Maintained     ambulation.  He was discharged from the hospital in stable     condition.  Of note, aspirin was added to the patient's home     medication regimen.  Education on stroke symptoms provided to the     patient. 2. Hypertension well controlled during the hospitalization.  The     patient will continue taking home medicines as noted above.  Over     30 minutes was spent on discharging the patient.          ______________________________ Manson Passey, MD     AD/MEDQ  D:  08/05/2011  T:  08/05/2011  Job:  [id number]  Electronically Signed by Manson Passey MD on 08/11/2011 09:41:09 AM

## 2011-08-16 ENCOUNTER — Emergency Department (HOSPITAL_COMMUNITY): Payer: Medicare Other

## 2011-08-16 ENCOUNTER — Emergency Department (HOSPITAL_COMMUNITY)
Admission: EM | Admit: 2011-08-16 | Discharge: 2011-08-16 | Disposition: A | Discharge status: Home / Self Care | Payer: Medicare Other | Attending: Emergency Medicine | Admitting: Emergency Medicine

## 2011-08-16 DIAGNOSIS — Z79899 Other long term (current) drug therapy: Secondary | ICD-10-CM | POA: Insufficient documentation

## 2011-08-16 DIAGNOSIS — M549 Dorsalgia, unspecified: Secondary | ICD-10-CM | POA: Insufficient documentation

## 2011-08-16 DIAGNOSIS — K5900 Constipation, unspecified: Secondary | ICD-10-CM | POA: Insufficient documentation

## 2011-08-16 DIAGNOSIS — R1032 Left lower quadrant pain: Secondary | ICD-10-CM | POA: Insufficient documentation

## 2011-08-16 DIAGNOSIS — I10 Essential (primary) hypertension: Secondary | ICD-10-CM | POA: Insufficient documentation

## 2011-08-16 LAB — POCT I-STAT, CHEM 8
BUN: 15 mg/dL (ref 6–23)
Chloride: 100 mEq/L (ref 96–112)
HCT: 44 % (ref 39.0–52.0)
Sodium: 137 mEq/L (ref 135–145)
TCO2: 26 mmol/L (ref 0–100)

## 2011-08-16 LAB — DIFFERENTIAL
Basophils Absolute: 0.1 10*3/uL (ref 0.0–0.1)
Eosinophils Absolute: 0.1 10*3/uL (ref 0.0–0.7)
Lymphs Abs: 1.3 10*3/uL (ref 0.7–4.0)
Neutrophils Relative %: 75 % (ref 43–77)

## 2011-08-16 LAB — URINALYSIS, ROUTINE W REFLEX MICROSCOPIC
Glucose, UA: NEGATIVE mg/dL
Leukocytes, UA: NEGATIVE
Nitrite: NEGATIVE
Protein, ur: NEGATIVE mg/dL
pH: 5.5 (ref 5.0–8.0)

## 2011-08-16 LAB — CBC
MCV: 93 fL (ref 78.0–100.0)
Platelets: 183 10*3/uL (ref 150–400)
RBC: 4.26 MIL/uL (ref 4.22–5.81)
WBC: 7.7 10*3/uL (ref 4.0–10.5)

## 2011-08-16 MED ORDER — IOHEXOL 300 MG/ML  SOLN: 100.0000 mL | Freq: Once | INTRAMUSCULAR | Status: DC | PRN

## 2011-12-13 ENCOUNTER — Other Ambulatory Visit: Payer: Self-pay | Admitting: Neurosurgery

## 2011-12-13 DIAGNOSIS — M47812 Spondylosis without myelopathy or radiculopathy, cervical region: Secondary | ICD-10-CM

## 2011-12-13 DIAGNOSIS — M47816 Spondylosis without myelopathy or radiculopathy, lumbar region: Secondary | ICD-10-CM

## 2011-12-14 ENCOUNTER — Ambulatory Visit: Payer: Medicare Other | Attending: Anesthesiology | Admitting: Rehabilitative and Restorative Service Providers"

## 2011-12-14 DIAGNOSIS — IMO0001 Reserved for inherently not codable concepts without codable children: Secondary | ICD-10-CM | POA: Insufficient documentation

## 2011-12-14 DIAGNOSIS — M545 Low back pain, unspecified: Secondary | ICD-10-CM | POA: Insufficient documentation

## 2011-12-14 DIAGNOSIS — M2569 Stiffness of other specified joint, not elsewhere classified: Secondary | ICD-10-CM | POA: Insufficient documentation

## 2011-12-14 DIAGNOSIS — M542 Cervicalgia: Secondary | ICD-10-CM | POA: Insufficient documentation

## 2011-12-18 ENCOUNTER — Ambulatory Visit
Admission: RE | Admit: 2011-12-18 | Discharge: 2011-12-18 | Disposition: A | Discharge status: Home / Self Care | Payer: Medicare Other | Source: Ambulatory Visit | Attending: Neurosurgery | Admitting: Neurosurgery

## 2011-12-18 DIAGNOSIS — M47816 Spondylosis without myelopathy or radiculopathy, lumbar region: Secondary | ICD-10-CM

## 2011-12-18 DIAGNOSIS — M47812 Spondylosis without myelopathy or radiculopathy, cervical region: Secondary | ICD-10-CM

## 2012-02-06 ENCOUNTER — Other Ambulatory Visit: Payer: Self-pay | Admitting: Family Medicine

## 2012-02-06 DIAGNOSIS — R634 Abnormal weight loss: Secondary | ICD-10-CM

## 2012-02-07 ENCOUNTER — Inpatient Hospital Stay: Admission: RE | Admit: 2012-02-07 | Payer: Medicare Other | Source: Ambulatory Visit

## 2012-02-07 ENCOUNTER — Ambulatory Visit
Admission: RE | Admit: 2012-02-07 | Discharge: 2012-02-07 | Disposition: A | Discharge status: Home / Self Care | Payer: Medicare Other | Source: Ambulatory Visit | Attending: Family Medicine | Admitting: Family Medicine

## 2012-02-07 ENCOUNTER — Other Ambulatory Visit: Payer: Medicare Other

## 2012-02-07 DIAGNOSIS — R634 Abnormal weight loss: Secondary | ICD-10-CM

## 2012-02-07 MED ORDER — IOHEXOL 300 MG/ML  SOLN
100.0000 mL | Freq: Once | INTRAMUSCULAR | Status: AC | PRN
  Administered 2012-02-07: 100 mL via INTRAVENOUS

## 2012-02-09 ENCOUNTER — Ambulatory Visit (INDEPENDENT_AMBULATORY_CARE_PROVIDER_SITE_OTHER): Payer: Medicare Other | Admitting: Emergency Medicine

## 2012-02-09 ENCOUNTER — Encounter: Payer: Self-pay | Admitting: Emergency Medicine

## 2012-02-09 VITALS — BP 118/62 | HR 58 | Temp 97.5°F | Ht 74.0 in | Wt 218.8 lb

## 2012-02-09 DIAGNOSIS — J449 Chronic obstructive pulmonary disease, unspecified: Secondary | ICD-10-CM

## 2012-02-09 DIAGNOSIS — R9389 Abnormal findings on diagnostic imaging of other specified body structures: Secondary | ICD-10-CM

## 2012-02-09 NOTE — Patient Instructions (Signed)
We will repeat you CT scan of the chest in October to insure that it is not changing Walking oximetry today We will perform full breathing tests at your next appointment Blood work today We may decide to start inhaled medications depending on your breathing tests Follow with Dr Delton Coombes next available appointment with full PFT

## 2012-02-09 NOTE — Assessment & Plan Note (Signed)
Looks like a linear opacity or scar, but needs serial scans for follow up - next scan in 6 months, in October

## 2012-02-09 NOTE — Progress Notes (Signed)
Subjective:    Patient ID: Tyler [name], male    DOB: 1941-10-08, 71 y.o.   MRN: [id number]  HPI 71 yo former heavy smoker, hx HTN, DM, allergies. Has been dx with COPD about 2 weeks ago, started on Advair but only uses it about once a week. Has been rx for PNA in the past, including as a child. Was referred for abnormal CXR and CT scan chest by Dr Clarene Duke. He was well until 2 months ago when he developed gastroenteritis w Nausea, diarrhea. Finally got better but he has had poor appetite, has lost about 20 lbs over 2 months. May be a bit better now. Part of the eval for his wt loss, especially w smoking hx, was CXR and then CT scan. His eval revealed erythrocytosis, some renal insufficiency. The CT scan shows a L hilar area that looks like scar vs bronchiectasis.    Review of Systems  Constitutional: Positive for unexpected weight change. Negative for fever.       Down 20 lbs in 1 month  HENT: Negative.  Negative for ear pain, nosebleeds, congestion, sore throat, rhinorrhea, sneezing, trouble swallowing, dental problem, postnasal drip and sinus pressure.   Eyes: Negative.  Negative for redness and itching.  Respiratory: Positive for shortness of breath. Negative for cough, chest tightness and wheezing.   Cardiovascular: Negative.  Negative for palpitations and leg swelling.  Gastrointestinal: Negative for nausea and vomiting.       Heartburn Indigestion   Genitourinary: Negative.  Negative for dysuria.  Musculoskeletal: Positive for arthralgias. Negative for joint swelling.  Skin: Negative.  Negative for rash.  Neurological: Negative.  Negative for headaches.  Hematological: Negative.  Does not bruise/bleed easily.  Psychiatric/Behavioral: Negative.  Negative for dysphoric mood. The patient is not nervous/anxious.    Past Medical History  Diagnosis Date  . Hypertension   . Diabetes mellitus   . Allergic rhinitis      Family History  Problem Relation Age of Onset  . Heart disease  Mother   . Arthritis Sister   . Arthritis Sister   . Liver cancer Father      History   Social History  . Marital Status: Divorced    Spouse Name: N/A    Number of Children: N/A  . Years of Education: N/A   Occupational History  . RETIRED     worked at a service station   Social History Main Topics  . Smoking status: Former Smoker -- 2.0 packs/day for 50 years    Types: Cigarettes    Quit date: 12/01/2010  . Smokeless tobacco: Not on file  . Alcohol Use: Yes     4 beers every night  . Drug Use: No  . Sexually Active: Not on file   Other Topics Concern  . Not on file   Social History Narrative  . No narrative on file     Allergies  Allergen Reactions  . Codeine   . Toradol   . Tramadol   . Tussionex Pennkinetic Er      No outpatient prescriptions prior to visit.         Objective:   Physical Exam  Gen: Pleasant, well-nourished, in no distress,  normal affect  ENT: No lesions,  mouth clear,  oropharynx clear, no postnasal drip  Neck: No JVD, no TMG, no carotid bruits  Lungs: No use of accessory muscles, no dullness to percussion, clear without rales or rhonchi  Cardiovascular: RRR, heart sounds normal, no murmur or  gallops, no peripheral edema  Musculoskeletal: No deformities, no cyanosis or clubbing  Neuro: alert, non focal  Skin: Warm, no lesions or rashes    Assessment & Plan:  COPD (chronic obstructive pulmonary disease) - need full PFT - will likely need BD's, spiriva would be best choice, will start after PFT - walking 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

## 2012-02-09 NOTE — Assessment & Plan Note (Signed)
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

## 2012-02-28 ENCOUNTER — Encounter: Payer: Self-pay | Admitting: Emergency Medicine

## 2012-03-02 ENCOUNTER — Ambulatory Visit: Payer: Medicare Other | Admitting: Emergency Medicine

## 2012-03-27 ENCOUNTER — Other Ambulatory Visit (HOSPITAL_COMMUNITY): Payer: Self-pay | Admitting: Family Medicine

## 2012-03-27 DIAGNOSIS — R918 Other nonspecific abnormal finding of lung field: Secondary | ICD-10-CM

## 2012-04-04 ENCOUNTER — Encounter (HOSPITAL_COMMUNITY)
Admission: RE | Admit: 2012-04-04 | Discharge: 2012-04-04 | Disposition: A | Discharge status: Home / Self Care | Payer: Medicare Other | Source: Ambulatory Visit | Attending: Family Medicine | Admitting: Family Medicine

## 2012-04-04 DIAGNOSIS — J984 Other disorders of lung: Secondary | ICD-10-CM | POA: Insufficient documentation

## 2012-04-04 DIAGNOSIS — K449 Diaphragmatic hernia without obstruction or gangrene: Secondary | ICD-10-CM | POA: Insufficient documentation

## 2012-04-04 DIAGNOSIS — R222 Localized swelling, mass and lump, trunk: Secondary | ICD-10-CM | POA: Insufficient documentation

## 2012-04-04 DIAGNOSIS — R918 Other nonspecific abnormal finding of lung field: Secondary | ICD-10-CM

## 2012-04-04 MED ORDER — FLUDEOXYGLUCOSE F - 18 (FDG) INJECTION
19.9000 | Freq: Once | INTRAVENOUS | Status: AC | PRN
  Administered 2012-04-04: 19.9 via INTRAVENOUS

## 2012-04-09 ENCOUNTER — Telehealth: Payer: Self-pay | Admitting: Emergency Medicine

## 2012-04-09 ENCOUNTER — Encounter: Payer: Self-pay | Admitting: *Deleted

## 2012-04-09 DIAGNOSIS — R222 Localized swelling, mass and lump, trunk: Secondary | ICD-10-CM

## 2012-04-09 NOTE — Telephone Encounter (Signed)
Notified by Willette Pa at [facility] that pt has been referred to them for abnormal PET. Dr Clarene Duke ordered the PET given his index of suspicion regarding abnormal CT scan that I reviewed from 02/07/12. This appears to be fortuitous as his L hilar region and other mediastinal nodes are hypermetabolic. Give this finding I believe that pt needs EBUS for bx ASAP. I called and explained this to the patient, recommended biopsy and offered to set it up now. He wanted to discuss the findings with Dr Clarene Duke, then get back to me (either himself or through Dr Clarene Duke). I will contact Dr Fredirick Maudlin office to let them know that I have reviewed case w [facility], my recs.   Called and reviewed case with Dr Clarene Duke at [facility] [phone]), let him know that Tyler [name] wants to discuss case with him. I've recommended EBUS, will try to set it up when either the patient or Dr Clarene Duke call me.

## 2012-04-09 NOTE — Progress Notes (Signed)
Spoke with Dr. Delton Coombes regarding referral.  He spoke with Dr. Clarene Duke.  Pt will see PCP then possible tissue dx from Dr. Delton Coombes.

## 2012-04-10 ENCOUNTER — Telehealth: Payer: Self-pay | Admitting: Emergency Medicine

## 2012-04-10 NOTE — Telephone Encounter (Signed)
Will forward to RB as FYI and sign off on message per protocol.

## 2012-04-11 ENCOUNTER — Other Ambulatory Visit: Payer: Self-pay | Admitting: Emergency Medicine

## 2012-04-11 NOTE — Addendum Note (Signed)
Addended by: Leslye Peer on: 04/11/2012 03:50 PM   Modules accepted: Orders

## 2012-04-11 NOTE — Telephone Encounter (Signed)
Pt needs EBUS, has agreed to do so per my conversation w Dr Clarene Duke today. Will order now, hopefully this week or next

## 2012-04-11 NOTE — Telephone Encounter (Signed)
Case request and orders placed

## 2012-04-12 ENCOUNTER — Encounter (HOSPITAL_COMMUNITY): Payer: Self-pay | Admitting: Respiratory Therapy

## 2012-04-13 ENCOUNTER — Encounter (HOSPITAL_COMMUNITY)
Admission: RE | Admit: 2012-04-13 | Discharge: 2012-04-13 | Disposition: A | Discharge status: Home / Self Care | Payer: Medicare Other | Source: Ambulatory Visit | Attending: Emergency Medicine | Admitting: Emergency Medicine

## 2012-04-13 LAB — CBC
MCH: 32.6 pg (ref 26.0–34.0)
MCHC: 33.8 g/dL (ref 30.0–36.0)
Platelets: 151 10*3/uL (ref 150–400)

## 2012-04-13 LAB — COMPREHENSIVE METABOLIC PANEL
ALT: 13 U/L (ref 0–53)
AST: 15 U/L (ref 0–37)
Calcium: 9.2 mg/dL (ref 8.4–10.5)
Creatinine, Ser: 1.16 mg/dL (ref 0.50–1.35)
GFR calc Af Amer: 72 mL/min — ABNORMAL LOW (ref >90)
Sodium: 139 mEq/L (ref 135–145)
Total Protein: 6.9 g/dL (ref 6.0–8.3)

## 2012-04-13 LAB — PROTIME-INR: INR: 0.99 (ref 0.00–1.49)

## 2012-04-13 NOTE — Pre-Procedure Instructions (Signed)
20 Tyler [name]  04/13/2012   Your procedure is scheduled on:  Monday April 16, 2012 AT 0730 AM  Report to [facility] at 0530 AM.  Call this number if you have problems the morning of surgery: [phone]   Remember: DO NOT EAT OR DRINK ANYTHING AFTER MIDNIGHT 04/15/2012     .  Take these medicines the morning of surgery with A SIP OF WATER: ATENOLOL  ATARAX PRILOSEC PERCOCET PAXIL   Do not wear jewelry, make-up or nail polish.  Do not wear lotions, powders, or perfumes. You may wear deodorant.  Do not shave 48 hours prior to surgery. Men may shave face and neck.  Do not bring valuables to the hospital.  Contacts, dentures or bridgework may not be worn into surgery.  Leave suitcase in the car. After surgery it may be brought to your room.  For patients admitted to the hospital, checkout time is 11:00 AM the day of discharge.   Patients discharged the day of surgery will not be allowed to drive home.  Name and phone number of your driver:   Special Instructions: CHG Shower Use Special Wash: 1/2 bottle night before surgery and 1/2 bottle morning of surgery.   Please read over the following fact sheets that you were given: Pain Booklet, Coughing and Deep Breathing, Lab Information, MRSA Information and Surgical Site Infection Prevention

## 2012-04-16 ENCOUNTER — Encounter (HOSPITAL_COMMUNITY): Payer: Self-pay | Admitting: Certified Registered Nurse Anesthetist

## 2012-04-16 ENCOUNTER — Encounter (HOSPITAL_COMMUNITY)
Admission: RE | Disposition: A | Discharge status: Home / Self Care | Payer: Self-pay | Source: Ambulatory Visit | Attending: Emergency Medicine

## 2012-04-16 ENCOUNTER — Encounter (HOSPITAL_COMMUNITY): Payer: Self-pay | Admitting: *Deleted

## 2012-04-16 ENCOUNTER — Ambulatory Visit (HOSPITAL_COMMUNITY)
Admission: RE | Admit: 2012-04-16 | Discharge: 2012-04-16 | Disposition: A | Discharge status: Home / Self Care | Payer: Medicare Other | Source: Ambulatory Visit | Attending: Emergency Medicine | Admitting: Emergency Medicine

## 2012-04-16 ENCOUNTER — Ambulatory Visit (HOSPITAL_COMMUNITY): Payer: Medicare Other | Admitting: Certified Registered Nurse Anesthetist

## 2012-04-16 DIAGNOSIS — Z87891 Personal history of nicotine dependence: Secondary | ICD-10-CM | POA: Insufficient documentation

## 2012-04-16 DIAGNOSIS — J449 Chronic obstructive pulmonary disease, unspecified: Secondary | ICD-10-CM | POA: Insufficient documentation

## 2012-04-16 DIAGNOSIS — J4489 Other specified chronic obstructive pulmonary disease: Secondary | ICD-10-CM | POA: Insufficient documentation

## 2012-04-16 DIAGNOSIS — I10 Essential (primary) hypertension: Secondary | ICD-10-CM | POA: Insufficient documentation

## 2012-04-16 DIAGNOSIS — R911 Solitary pulmonary nodule: Secondary | ICD-10-CM

## 2012-04-16 DIAGNOSIS — E119 Type 2 diabetes mellitus without complications: Secondary | ICD-10-CM | POA: Insufficient documentation

## 2012-04-16 LAB — GLUCOSE, CAPILLARY: Glucose-Capillary: 90 mg/dL (ref 70–99)

## 2012-04-16 SURGERY — BRONCHOSCOPY, WITH EBUS
Anesthesia: General | Laterality: Left | Wound class: Clean Contaminated

## 2012-04-16 MED ORDER — EPHEDRINE SULFATE 50 MG/ML IJ SOLN
INTRAMUSCULAR | Status: DC | PRN
  Administered 2012-04-16: 10 mg via INTRAVENOUS
  Administered 2012-04-16: 5 mg via INTRAVENOUS
  Administered 2012-04-16: 15 mg via INTRAVENOUS

## 2012-04-16 MED ORDER — 0.9 % SODIUM CHLORIDE (POUR BTL) OPTIME
TOPICAL | Status: DC | PRN
  Administered 2012-04-16: 1000 mL

## 2012-04-16 MED ORDER — LIDOCAINE HCL (CARDIAC) 20 MG/ML IV SOLN
INTRAVENOUS | Status: DC | PRN
  Administered 2012-04-16: 100 mg via INTRAVENOUS

## 2012-04-16 MED ORDER — GLYCOPYRROLATE 0.2 MG/ML IJ SOLN
INTRAMUSCULAR | Status: DC | PRN
  Administered 2012-04-16: .8 mg via INTRAVENOUS

## 2012-04-16 MED ORDER — ROCURONIUM BROMIDE 100 MG/10ML IV SOLN
INTRAVENOUS | Status: DC | PRN
  Administered 2012-04-16: 40 mg via INTRAVENOUS

## 2012-04-16 MED ORDER — ONDANSETRON HCL 4 MG/2ML IJ SOLN
INTRAMUSCULAR | Status: DC | PRN
  Administered 2012-04-16: 4 mg via INTRAVENOUS

## 2012-04-16 MED ORDER — FENTANYL CITRATE 0.05 MG/ML IJ SOLN
INTRAMUSCULAR | Status: DC | PRN
  Administered 2012-04-16: 100 ug via INTRAVENOUS
  Administered 2012-04-16: 50 ug via INTRAVENOUS

## 2012-04-16 MED ORDER — PROPOFOL 10 MG/ML IV BOLUS
INTRAVENOUS | Status: DC | PRN
  Administered 2012-04-16: 180 mg via INTRAVENOUS

## 2012-04-16 MED ORDER — HYDROMORPHONE HCL PF 1 MG/ML IJ SOLN: 0.2500 mg | INTRAMUSCULAR | Status: DC | PRN

## 2012-04-16 MED ORDER — LACTATED RINGERS IV SOLN
INTRAVENOUS | Status: DC | PRN
  Administered 2012-04-16 (×2): via INTRAVENOUS

## 2012-04-16 MED ORDER — ONDANSETRON HCL 4 MG/2ML IJ SOLN: 4.0000 mg | Freq: Once | INTRAMUSCULAR | Status: DC | PRN

## 2012-04-16 MED ORDER — NEOSTIGMINE METHYLSULFATE 1 MG/ML IJ SOLN
INTRAMUSCULAR | Status: DC | PRN
  Administered 2012-04-16: 4 mg via INTRAVENOUS

## 2012-04-16 SURGICAL SUPPLY — 25 items
BRUSH CYTOL CELLEBRITY 1.5X140 (MISCELLANEOUS) IMPLANT
CANISTER SUCTION 2500CC (MISCELLANEOUS) ×2 IMPLANT
CLOTH BEACON ORANGE TIMEOUT ST (SAFETY) ×2 IMPLANT
CONT SPEC 4OZ CLIKSEAL STRL BL (MISCELLANEOUS) ×4 IMPLANT
COVER TABLE BACK 60X90 (DRAPES) ×2 IMPLANT
FORCEPS BIOP RJ4 1.8 (CUTTING FORCEPS) IMPLANT
GLOVE BIOGEL PI IND STRL 6.5 (GLOVE) ×1 IMPLANT
GLOVE BIOGEL PI INDICATOR 6.5 (GLOVE) ×1
GLOVE SURG SIGNA 7.5 PF LTX (GLOVE) ×2 IMPLANT
GLOVE SURG SS PI 6.0 STRL IVOR (GLOVE) ×2 IMPLANT
GOWN BRE IMP SLV AUR LG STRL (GOWN DISPOSABLE) ×2 IMPLANT
KIT ROOM TURNOVER OR (KITS) ×2 IMPLANT
MARKER SKIN DUAL TIP RULER LAB (MISCELLANEOUS) ×2 IMPLANT
NEEDLE BIOPSY TRANSBRONCH 21G (NEEDLE) IMPLANT
NEEDLE SYS SONOTIP II EBUSTBNA (NEEDLE) ×4 IMPLANT
NS IRRIG 1000ML POUR BTL (IV SOLUTION) ×2 IMPLANT
OIL SILICONE PENTAX (PARTS (SERVICE/REPAIRS)) IMPLANT
PAD ARMBOARD 7.5X6 YLW CONV (MISCELLANEOUS) ×4 IMPLANT
SPONGE GAUZE 4X4 12PLY (GAUZE/BANDAGES/DRESSINGS) IMPLANT
SYR 20CC LL (SYRINGE) ×2 IMPLANT
SYR 20ML ECCENTRIC (SYRINGE) ×4 IMPLANT
SYR 5ML LUER SLIP (SYRINGE) ×2 IMPLANT
TOWEL OR 17X24 6PK STRL BLUE (TOWEL DISPOSABLE) ×2 IMPLANT
TRAP SPECIMEN MUCOUS 40CC (MISCELLANEOUS) ×2 IMPLANT
TUBE CONNECTING 12X1/4 (SUCTIONS) ×4 IMPLANT

## 2012-04-16 NOTE — Anesthesia Procedure Notes (Signed)
Procedure Name: Intubation Date/Time: 04/16/2012 8:07 AM Performed by: Rogelia Boga Pre-anesthesia Checklist: Patient identified, Emergency Drugs available, Suction available, Patient being monitored and Timeout performed Patient Re-evaluated:Patient Re-evaluated prior to inductionOxygen Delivery Method: Circle system utilized Preoxygenation: Pre-oxygenation with 100% oxygen Intubation Type: IV induction Ventilation: Mask ventilation without difficulty Laryngoscope Size: Mac and 4 Grade View: Grade I Tube type: Oral Tube size: 8.0 mm Number of attempts: 1 Airway Equipment and Method: Stylet Placement Confirmation: ETT inserted through vocal cords under direct vision,  positive ETCO2 and breath sounds checked- equal and bilateral Secured at: 22 cm Tube secured with: Tape Dental Injury: Teeth and Oropharynx as per pre-operative assessment

## 2012-04-16 NOTE — Op Note (Signed)
Video Bronchoscopy with Endobronchial Ultrasound Procedure Note  Date of Operation: 04/16/2012  Pre-op Diagnosis: L hilar nodule with LAD  Post-op Diagnosis: same  Surgeon: [name]  Assistants: none  Anesthesia: General endotracheal anesthesia  Operation: Flexible video fiberoptic bronchoscopy with endobronchial ultrasound and biopsies.  Estimated Blood Loss: Minimal  Complications: None apparent  Indications and History: Tyler [name] is a 71 y.o. male with an abnormal CT scan of the chest, showing a L hilar mass vs LAD. A subsequent PET scan showed that the L hilar lesion was hypermetabolic and the there were hypermetabolic nodes at 4L (in the AP window) as well as probably 4R and a more superior paratracheal node.  The risks, benefits, complications, treatment options and expected outcomes were discussed with the patient.  The possibilities of pneumothorax, pneumonia, reaction to medication, pulmonary aspiration, perforation of a viscus, bleeding, failure to diagnose a condition and creating a complication requiring transfusion or operation were discussed with the patient who freely signed the consent.    Description of Procedure: The patient was examined in the preoperative area and history and data from the preprocedure consultation were reviewed. It was deemed appropriate to proceed.  The patient was taken to OR15, identified as [name] and the procedure verified as Flexible Video Fiberoptic Bronchoscopy.  A Time Out was held and the above information confirmed. After being taken to the operating room general anesthesia was initiated and the patient  was orally intubated. The video fiberoptic bronchoscope was introduced via the endotracheal tube and a general inspection was performed which showed normal airway exam with the exception of uniform narrowing of the LLL superior segmental airway. There were no endobronchial lesions seen. The standard scope was then  withdrawn and the endobronchial ultrasound was used to identify and characterize the peritracheal, hilar and bronchial lymph nodes. Inspection showed the L hilar mass and node in the AP window. The R sided nodes could not be localized. Using real-time ultrasound guidance Wang needle biopsies were take from Station 4L node and from the L hilar mass and were sent for cytology. The patient tolerated the procedure well without apparent complications. Pooled bronchial washings were sent for micro studies. There was no significant blood loss. The bronchoscope was withdrawn. Anesthesia was reversed and the patient was taken to the PACU for recovery.   Samples: 1. Wang needle biopsies from 4L node 2. Wang needle biopsies from L hilar mass 3. Pooled bronchial washings for culture Plans:  The patient will be discharged from the PACU to home when recovered from anesthesia. We will review the cytology and microbiology results with the patient when they become available. Outpatient followup will be with Drs Delton Coombes and Little.    [name], MD, PhD 04/16/2012, 9:22 AM  Pulmonary and Critical Care [phone] or if no answer [phone]

## 2012-04-16 NOTE — H&P (Signed)
PCCM H&P and Interval history HPI  71 yo former heavy smoker, hx HTN, DM, allergies, dx with COPD in 4/13, started on Advair but only used it about once a week. Has been rx for PNA in the past, including as a child. Was referred for abnormal CXR and CT scan chest by Dr Clarene Duke. He has lost about 20 lbs over 4 months. May be a bit better now.  The CT scan shows a L hilar area that looks like scar vs bronchiectasis.  I had originally planned to follow the CT scan for interval change in 6 months, but given suspicion, a PET scan was performed earlier by Dr Clarene Duke - shows areas of hypermetabolic activity as below. Plan is to perform FOB + EBUS to achieve tissue dx.   Review of Systems  Constitutional: Positive for unexpected weight change. Negative for fever.  Down 20 lbs in 1 month  HENT: Negative. Negative for ear pain, nosebleeds, congestion, sore throat, rhinorrhea, sneezing, trouble swallowing, dental problem, postnasal drip and sinus pressure.  Eyes: Negative. Negative for redness and itching.  Respiratory: Positive for shortness of breath. Negative for cough, chest tightness and wheezing.  Cardiovascular: Negative. Negative for palpitations and leg swelling.  Gastrointestinal: Negative for nausea and vomiting.  Heartburn  Indigestion  Genitourinary: Negative. Negative for dysuria.  Musculoskeletal: Positive for arthralgias. Negative for joint swelling.  Skin: Negative. Negative for rash.  Neurological: Negative. Negative for headaches.  Hematological: Negative. Does not bruise/bleed easily.  Psychiatric/Behavioral: Negative. Negative for dysphoric mood. The patient is not nervous/anxious.   Past Medical History   Diagnosis  Date   .  Hypertension    .  Diabetes mellitus    .  Allergic rhinitis     Family History   Problem  Relation  Age of Onset   .  Heart disease  Mother    .  Arthritis  Sister    .  Arthritis  Sister    .  Liver cancer  Father     History    Social History   .   Marital Status:  Divorced     Spouse Name:  N/A     Number of Children:  N/A   .  Years of Education:  N/A    Occupational History   .  RETIRED      worked at a service station    Social History Main Topics   .  Smoking status:  Former Smoker -- 2.0 packs/day for 50 years     Types:  Cigarettes     Quit date:  12/01/2010   .  Smokeless tobacco:  Not on file   .  Alcohol Use:  Yes      4 beers every night   .  Drug Use:  No   .  Sexually Active:  Not on file    Other Topics  Concern   .  Not on file    Social History Narrative   .  No narrative on file    Allergies   Allergen  Reactions   .  Codeine    .  Toradol    .  Tramadol    .  Tussionex Pennkinetic Er     No outpatient prescriptions prior to visit.     Objective:   Physical Exam  Filed Vitals:   04/16/12 0622  BP: 160/74  Pulse: 50  Temp: 98.4 F (36.9 C)  Resp: 18   Gen: Pleasant, well-nourished, in no distress,  normal affect  ENT: No lesions, mouth clear, oropharynx clear, no postnasal drip  Neck: No JVD, no TMG, no carotid bruits  Lungs: No use of accessory muscles, no dullness to percussion, clear without rales or rhonchi  Cardiovascular: RRR, heart sounds normal, no murmur or gallops, no peripheral edema  Musculoskeletal: No deformities, no cyanosis or clubbing  Neuro: alert, non focal  Skin: Warm, no lesions or rashes   BMET    Component Value Date/Time   NA 139 04/13/2012 1509   K 5.0 04/13/2012 1509   CL 103 04/13/2012 1509   CO2 29 04/13/2012 1509   GLUCOSE 112* 04/13/2012 1509   BUN 24* 04/13/2012 1509   CREATININE 1.16 04/13/2012 1509   CALCIUM 9.2 04/13/2012 1509   GFRNONAA 62* 04/13/2012 1509   GFRAA 72* 04/13/2012 1509   CBC    Component Value Date/Time   WBC 6.7 04/13/2012 1509   RBC 4.05* 04/13/2012 1509   HGB 13.2 04/13/2012 1509   HCT 39.1 04/13/2012 1509   PLT 151 04/13/2012 1509   MCV 96.5 04/13/2012 1509   MCH 32.6 04/13/2012 1509   MCHC 33.8 04/13/2012 1509   RDW 14.6 04/13/2012  1509   LYMPHSABS 1.3 08/16/2011 1030   MONOABS 0.5 08/16/2011 1030   EOSABS 0.1 08/16/2011 1030   BASOSABS 0.1 08/16/2011 1030   PET Scan 04/04/12:  Comparison: 02/07/2012  Findings:  Neck: No hypermetabolic lymph nodes in the neck.  Chest: There is no supraclavicular or axillary adenopathy.  Multiple mediastinal lymph nodes are identified which exhibit  abnormal increased uptake. There is a low right paratracheal lymph  node which measures less than 1 cm but has an SUV max equal to 3.8,  image 97. Within the AP window region there is a 1 cm lymph node  which has an SUV max equal to 4.6, image 99 air bilateral hilar  lymph nodes are identified which are hypermetabolic. Lymph node  within the left hilar region measures approximately 2 x 2.9 cm and  has an SUV max equal to 7.5  The right hilar lymph node has an SUV max equal to 4.6, image  number 105. Again seen is a spiculated scar like density within  the superior segment of the right lower lobe. This appears to  emanate from the left infrahilar region and has an SUV max equal to  3.1.  The patient has a large hiatal hernia.  Abdomen/Pelvis: No abnormal hypermetabolic activity within the  liver, pancreas, adrenal glands, or spleen. No hypermetabolic  lymph nodes in the abdomen or pelvis.  Skelton: No focal hypermetabolic activity to suggest skeletal  metastasis.  IMPRESSION:  1. Examination is positive for malignant range FDG uptake within  the bilateral hilar and mediastinal lymph nodes.  2. Spiculated patch that irregular spiculated opacity within the  superior segment of the left lower lobe exhibits malignant range  FDG uptake. Cannot rule out tumor.  3. Advised further evaluation with bronchoscopy and biopsy of left  hilar lymph nodes.    Assessment & Plan:    Abnormal CT scan, chest  Looks like a linear opacity or scar but the PET shows hypermetabolic and positive nodes.  - No barriers identified to proceeding with FOB  + EBUS today

## 2012-04-16 NOTE — Transfer of Care (Signed)
Immediate Anesthesia Transfer of Care Note  Patient: Tyler [name]  Procedure(s) Performed: Procedure(s) (LRB): VIDEO BRONCHOSCOPY WITH ENDOBRONCHIAL ULTRASOUND (Left)  Patient Location: PACU  Anesthesia Type: General  Level of Consciousness: awake, alert , oriented and patient cooperative  Airway & Oxygen Therapy: Patient Spontanous Breathing and Patient connected to nasal cannula oxygen  Post-op Assessment: Report given to PACU RN, Post -op Vital signs reviewed and stable and Patient moving all extremities X 4  Post vital signs: Reviewed and stable  Complications: No apparent anesthesia complications

## 2012-04-16 NOTE — Anesthesia Postprocedure Evaluation (Signed)
  Anesthesia Post-op Note  Patient: Tyler [name]  Procedure(s) Performed: Procedure(s) (LRB): VIDEO BRONCHOSCOPY WITH ENDOBRONCHIAL ULTRASOUND (Left)  Patient Location: PACU  Anesthesia Type: General  Level of Consciousness: awake  Airway and Oxygen Therapy: Patient Spontanous Breathing  Post-op Pain: mild  Post-op Assessment: Post-op Vital signs reviewed, Patient's Cardiovascular Status Stable and Respiratory Function Stable  Post-op Vital Signs: stable  Complications: No apparent anesthesia complications

## 2012-04-16 NOTE — Preoperative (Signed)
Beta Blockers   Reason not to administer Beta Blockers:Pt took Beta Blocker at 0430 this am

## 2012-04-16 NOTE — Discharge Instructions (Signed)
Bronchoscopy Care After These instructions give you information on caring for yourself after your procedure. Your doctor may also give you specific instructions. Call your doctor if you have any problems or questions after your procedure. HOME CARE  Do not eat or drink anything for 2 hours after your test. Your nose and throat was numbed by medicine. If you try to eat or drink before the medicine wears off, food or drink could go into your lungs.   For the rest of the first day, eat soft food and drink liquids slowly.   On the day after the test, you can go back to eating your usual food.   Do not drive or sign important papers the day of the test.   Take it easy for the next 2 days. Do not do any heavy work, exercise, or activities.   Only take medicine as told by your doctor. Do not take aspirin.   You may be drowsy for the next 24 hours.   You may see traces of blood in your spit for 1 to 2 days.  Finding out the results of your test Ask when your test results will be ready. Make sure you get your test results. GET HELP RIGHT AWAY IF:  You have breathing problems.   You have a bad sore throat for more than 1 week.   You see traces of blood in your spit for more than 3 days.   You start coughing up blood.   You have a temperature of 102 F (38.9 C) or higher.  MAKE SURE YOU:  Understand these instructions.   Will watch your condition.   Will get help right away if you are not doing well or get worse.  Document Released: 08/14/2009 Document Revised: 10/06/2011 Document Reviewed: 08/14/2009 Exit[employer] Patient Information 2012 [employer]. 

## 2012-04-16 NOTE — Anesthesia Preprocedure Evaluation (Addendum)
Anesthesia Evaluation  Patient identified by MRN, date of birth, ID band Patient awake    Reviewed: Allergy & Precautions, H&P , NPO status , Patient's Chart, lab work & pertinent test results  Airway Mallampati: II TM Distance: >3 FB Neck ROM: Full    Dental  (+) Dental Advisory Given   Pulmonary COPD COPD inhaler,  breath sounds clear to auscultation        Cardiovascular hypertension, Pt. on medications and Pt. on home beta blockers Rhythm:Regular Rate:Normal     Neuro/Psych    GI/Hepatic GERD-  Medicated and Controlled,  Endo/Other    Renal/GU      Musculoskeletal   Abdominal   Peds  Hematology   Anesthesia Other Findings   Reproductive/Obstetrics                          Anesthesia Physical Anesthesia Plan  ASA: III  Anesthesia Plan: General   Post-op Pain Management:    Induction: Intravenous  Airway Management Planned: Oral ETT  Additional Equipment:   Intra-op Plan:   Post-operative Plan: Extubation in OR  Informed Consent: I have reviewed the patients History and Physical, chart, labs and discussed the procedure including the risks, benefits and alternatives for the proposed anesthesia with the patient or authorized representative who has indicated his/her understanding and acceptance.   Dental advisory given  Plan Discussed with: CRNA  Anesthesia Plan Comments:         Anesthesia Quick Evaluation

## 2012-04-17 ENCOUNTER — Telehealth: Payer: Self-pay | Admitting: Emergency Medicine

## 2012-04-17 NOTE — Telephone Encounter (Signed)
EBUS done yesterday- 04-16-12. Spoke with [place] and notified that this was done. Nothing further needed.

## 2012-04-18 LAB — CULTURE, RESPIRATORY W GRAM STAIN

## 2012-04-19 ENCOUNTER — Telehealth: Payer: Self-pay | Admitting: Emergency Medicine

## 2012-04-19 NOTE — Telephone Encounter (Signed)
Bx's show atypical cells not dx for malignancy. Will review the results at Thoracic conference to decide whether we feel compelled to try bx by another means. If we do not repeat bx now, he will still need repeat CT scan in few months as we originally planned.

## 2012-04-27 ENCOUNTER — Telehealth: Payer: Self-pay | Admitting: Emergency Medicine

## 2012-04-27 DIAGNOSIS — R222 Localized swelling, mass and lump, trunk: Secondary | ICD-10-CM

## 2012-04-27 NOTE — Telephone Encounter (Signed)
Discussed his case at [employer]. We agreed that he needs repeat bx, best strategy likely repeat EBUS with mediastinoscopy is unrevealing. Dr Tyrone Sage reviewed and agrees. I explained to the patient that we would like to schedule him for these procedures. He understands and agrees also.   Called pt's brother in law Gerlene Burdock, sister Claris Che, at his request to explain the above.

## 2012-05-01 NOTE — Telephone Encounter (Signed)
Left message with dee at [facility] for this appt

## 2012-05-04 NOTE — Telephone Encounter (Signed)
Dee finally called me back dr Nydia Bouton wants to see the pt in the office 1st then he will discuss any procedures [name]

## 2012-05-07 NOTE — Telephone Encounter (Signed)
Please make sure the appointment with Dr gerhardt is set up. Thanks

## 2012-05-07 NOTE — Telephone Encounter (Signed)
Spoke to dee@[facility]  she will call back with this appt [name]

## 2012-05-07 NOTE — Telephone Encounter (Signed)
Order was placed 04/27/12. Please advise PCC thanks

## 2012-05-07 NOTE — Telephone Encounter (Signed)
Patient calling back checking on the status of 2nd biopsy that is to be scheduled.

## 2012-05-08 NOTE — Telephone Encounter (Signed)
APPT TO SEE DR Nydia Bouton 05/10/12@3 :45PM PT AWARE [name]

## 2012-05-10 ENCOUNTER — Institutional Professional Consult (permissible substitution) (INDEPENDENT_AMBULATORY_CARE_PROVIDER_SITE_OTHER): Payer: Medicare Other | Admitting: Cardiothoracic Surgery

## 2012-05-10 ENCOUNTER — Encounter: Payer: Self-pay | Admitting: Cardiothoracic Surgery

## 2012-05-10 VITALS — BP 159/80 | HR 62 | Resp 20 | Ht 74.0 in | Wt 223.0 lb

## 2012-05-10 DIAGNOSIS — R590 Localized enlarged lymph nodes: Secondary | ICD-10-CM

## 2012-05-10 DIAGNOSIS — R911 Solitary pulmonary nodule: Secondary | ICD-10-CM

## 2012-05-10 DIAGNOSIS — R599 Enlarged lymph nodes, unspecified: Secondary | ICD-10-CM

## 2012-05-10 NOTE — Progress Notes (Signed)
[address]            [place] [address]          [id number]      Tyler [name] [facility] Medical Record #[id number] Date of Birth: 02-17-1941  Referring: Leslye Peer, MD Primary Care: Aida Puffer, MD  Chief Complaint:    Chief Complaint  Patient presents with  . Lung Lesion    Eval for repeat EBUS with Media  . Lymphadenopathy    History of Present Illness:    71 yo former heavy smoker who had a CT scan of the chest done as part of evaluation for unexplained weight loss. A PET scan was also performed. Dr. Delton Coombes perform bronchoscopy and EBUS, but without any pathologic findings. The patient is referred for consideration of repeat ebus and or  mediastinoscopy. Patient comes to the office today complaining of chest pain on and off almost daily for a month he notes that he gets short of breath walking to his mailbox, at times he has had an unexplained diaphoresis. The symptoms are made worse with exertion and after eating. He also describes reflux symptoms and bitter taste in his mouth but this is separate set of symptoms than the chest pain.   Current Activity/ Functional Status: Patient is independent with mobility/ambulation, transfers, ADL's, IADL's.   Past Medical History  Diagnosis Date  . Hypertension   . Diabetes mellitus   . Allergic rhinitis     Past Surgical History  Procedure Date  . Ankle surgery 2005  . Prostate surgery 11/25/2010    Family History  Problem Relation Age of Onset  . Heart disease Mother   . Arthritis Sister   . Arthritis Sister   . Liver cancer Father     History   Social History  . Marital Status: Divorced    Spouse Name: N/A    Number of Children: N/A  . Years of Education: N/A   Occupational History  . RETIRED     worked at a service station   Social History Main Topics  . Smoking status: Former Smoker -- 2.0 packs/day for 50 years    Types: Cigarettes    Quit date: 12/01/2010    . Smokeless tobacco: Not on file  . Alcohol Use: Yes     4 beers every night  . Drug Use: No  . Sexually Active: Not on file   Other Topics Concern  . Not on file   Social History Narrative  . No narrative on file    History  Smoking status  . Former Smoker -- 2.0 packs/day for 50 years  . Types: Cigarettes  . Quit date: 12/01/2010  Smokeless tobacco  . Not on file    History  Alcohol Use  . Yes    4 beers every night     Allergies  Allergen Reactions  . Codeine   . Hydrocod Polst-Cpm Polst Er   . Ketorolac Tromethamine   . Tramadol     Current Outpatient Prescriptions  Medication Sig Dispense Refill  . atenolol (TENORMIN) 100 MG tablet Take 100 mg by mouth 2 (two) times daily.      Marland Kitchen dexlansoprazole (DEXILANT) 60 MG capsule Take 60 mg by mouth daily. PRN when Prilosec is done      . omeprazole (PRILOSEC) 20 MG capsule Take 20 mg by mouth 2 (two) times daily.      Marland Kitchen  oxyCODONE-acetaminophen (PERCOCET) 10-325 MG per tablet Take 1 tablet by mouth every 4 (four) hours as needed.       Marland Kitchen PARoxetine (PAXIL) 10 MG tablet Take 10 mg by mouth daily.      . Tamsulosin HCl (FLOMAX) 0.4 MG CAPS Take 0.4 mg by mouth 2 (two) times daily.           Review of Systems:     Cardiac Review of Systems: Y or N  Chest Pain [ y   ]  Resting SOB [n   ] Exertional SOB  Cove.Etienne  ]  Orthopnea [ n ]   Pedal Edema [ n  ]    Palpitations [ n ] Syncope  [ n ]   Presyncope [ n  ]  General Review of Systems: [Y] = yes [  ]=no Constitional: recent weight change [loss  ]; anorexia [  ]; fatigue Cove.Etienne  ]; nausea [ y ]; night sweats Cove.Etienne  ]; fever [  ]; or chills [  ];                                                                                                                                          Dental: poor dentition[ n ];   Eye : blurred vision [  ]; diplopia [   ]; vision changes [  ];  Amaurosis fugax[  ]; Resp: cough [  ];  wheezing[  ];  hemoptysis[  ]; shortness of breath[  ]; paroxysmal  nocturnal dyspnea[  ]; dyspnea on exertion[  ]; or orthopnea[  ];  GI:  gallstones[  ], vomiting[  ];  dysphagia[  ]; melena[  ];  hematochezia [  ]; heartburn[ y ];   Hx of  Colonoscopy[n  ]; GU: kidney stones [  ]; hematuria[  ];   dysuria [  ];  nocturia[  ];  history of     obstruction [  ];             Skin: rash, swelling[  ];, hair loss[  ];  peripheral edema[  ];  or itching[  ]; Musculosketetal: myalgias[  ];  joint swelling[  ];  joint erythema[  ];  joint pain[y  ];  back pain[ y ];  Heme/Lymph: bruising[  ];  bleeding[n  ];  anemia[  ];  Neuro: TIA[  ];  headaches[n  ];  stroke[  ];  vertigo[  ];  seizures[n  ];   paresthesias[  ];  difficulty walking[ n ];  Psych:depression[  ]; anxiety[  ];  Endocrine: diabetes[y  ];  thyroid dysfunction[n  ];  Immunizations: Flu [?  ]; Pneumococcal[ ? ];  Other:  Physical Exam: BP 159/80  Pulse 62  Resp 20  Ht 6\' 2"  (1.88 m)  Wt 223 lb (101.152 kg)  BMI 28.63 kg/m2  SpO2 94%  General appearance: alert, cooperative,  appears older than stated age and mild distress Neurologic: intact Heart: regular rate and rhythm, S1, S2 normal, no murmur, click, rub or gallop and normal apical impulse Lungs: clear to auscultation bilaterally and normal percussion bilaterally Abdomen: soft, non-tender; bowel sounds normal; no masses,  no organomegaly Extremities: extremities normal, atraumatic, no cyanosis or edema and Homans sign is negative, no sign of DVT Patient does not have active wheezing, carotid without bruits   Diagnostic Studies & Laboratory data:     Recent Radiology Findings:  Dg Chest 2 View Within Previous 72 Hours.  Films Obtained On Friday Are Acceptable For Monday And Tuesday Cases  04/13/2012  *RADIOLOGY REPORT*  Clinical Data: Preop radiograph  CHEST - 2 VIEW  Comparison: 04/04/2012  Findings: The heart size and mediastinal contours are within normal limits. Small hiatal hernia.  Both lungs are clear.  The visualized skeletal  structures are unremarkable.  IMPRESSION:  1.  No acute cardiopulmonary abnormalities. 2.  Hiatal hernia.  Original Report Authenticated By: Rosealee Albee, M.D.   Clinical Data: Weight loss, shortness of breath, former smoking  history.  CT CHEST WITH CONTRAST  Technique: Multidetector CT imaging of the chest was performed  following the standard protocol during bolus administration of  intravenous contrast.  Contrast: OMNIPAQUE IOHEXOL 300 MG/ML SOLN  Comparison: Chest x-ray of 02/07/2012 and CT chest of 11/26/2010  Findings: On the lung window images, there is mild centrilobular  emphysema present. Also, there is streaky opacity emanating from  the left suprahilar region into the left upper lobe. This is not  seen previously. No definite mass is noted associated with this  linear opacity and this could represent scarring. However follow-  up CT of the chest in 4 months is recommended to assess stability.  No other infiltrate or effusion is seen.  On soft tissue window images, the thyroid gland is unremarkable.  The thoracic aorta opacifies with moderate atheromatous change  noted within the aortic arch and descending thoracic aorta.  Coronary artery calcifications are present in the distribution of  the left anterior descending coronary artery. There is mild  cardiomegaly noted. No mediastinal or hilar adenopathy is seen. A  single precarinal node to the left midline measures 9 mm in short  axis diameter. A moderate sized hiatal hernia is again noted. No  abnormality of the upper abdomen is seen. No bony abnormality is  noted.  IMPRESSION:  1. Irregular linear opacity within the left upper lobe. Possible  scarring but recommend follow-up CT of the chest in 4 months to  assess stability. No mediastinal or hilar adenopathy is seen.  2. Coronary artery calcifications and mild cardiomegaly.  3. Moderate sized hiatal hernia.  4. Centrilobular emphysema.  Original Report  Authenticated By: Juline Patch, M.D.  NUCLEAR MEDICINE PET SKULL BASE TO THIGH  Fasting Blood Glucose: 99  Technique: 19.9 mCi F-18 FDG was injected intravenously. CT data  was obtained and used for attenuation correction and anatomic  localization only. (This was not acquired as a diagnostic CT  examination.) Additional exam technical data entered on  technologist worksheet.  Comparison: 02/07/2012  Findings:  Neck: No hypermetabolic lymph nodes in the neck.  Chest: There is no supraclavicular or axillary adenopathy.  Multiple mediastinal lymph nodes are identified which exhibit  abnormal increased uptake. There is a low right paratracheal lymph  node which measures less than 1 cm but has an SUV max equal to 3.8,  image 97. Within the AP window region  there is a 1 cm lymph node  which has an SUV max equal to 4.6, image 99 air bilateral hilar  lymph nodes are identified which are hypermetabolic. Lymph node  within the left hilar region measures approximately 2 x 2.9 cm and  has an SUV max equal to 7.5  The right hilar lymph node has an SUV max equal to 4.6, image  number 105. Again seen is a spiculated scar like density within  the superior segment of the right lower lobe. This appears to  emanate from the left infrahilar region and has an SUV max equal to  3.1.  The patient has a large hiatal hernia.  Abdomen/Pelvis: No abnormal hypermetabolic activity within the  liver, pancreas, adrenal glands, or spleen. No hypermetabolic  lymph nodes in the abdomen or pelvis.  Skelton: No focal hypermetabolic activity to suggest skeletal  metastasis.  IMPRESSION:  1. Examination is positive for malignant range FDG uptake within  the bilateral hilar and mediastinal lymph nodes.  2. Spiculated patch that irregular spiculated opacity within the  superior segment of the left lower lobe exhibits malignant range  FDG uptake. Cannot rule out tumor.  3. Advised further evaluation with bronchoscopy  and biopsy of left  hilar lymph nodes.  Original Report Authenticated By: Rosealee Albee, M.D.      Recent Lab Findings: Lab Results  Component Value Date   WBC 6.7 04/13/2012   HGB 13.2 04/13/2012   HCT 39.1 04/13/2012   PLT 151 04/13/2012   GLUCOSE 112* 04/13/2012   CHOL 194 08/04/2011   TRIG 262* 08/04/2011   HDL 26* 08/04/2011   LDLCALC 116* 08/04/2011   ALT 13 04/13/2012   AST 15 04/13/2012   NA 139 04/13/2012   K 5.0 04/13/2012   CL 103 04/13/2012   CREATININE 1.16 04/13/2012   BUN 24* 04/13/2012   CO2 29 04/13/2012   TSH 1.423 08/03/2011   INR 0.99 04/13/2012   HGBA1C 6.1* 08/04/2011      Assessment / Plan:     Patient found incidentally to have bilateral hilar adenopathy, PET avid with a question of left lower lobe lung mass. So far ebus did not reveal any specific tissue diagnosis. The patient comes to the office today with his set of symptoms out of proportion to the findings on CT scan, a long-term smoker with diabetes and notes increasing shortness of breath with exertion episodes of diaphoresis and chest discomfort with exertion and with eating.   The patient has been referred for repeat ebus and possible mediastinoscopy.  With his constellation of chest discomfort probably not cause of the mediastinal adenopathy I referred to cardiology to consider any further cardiac testing is indicated.  Following this we will proceed with bronchoscopy ebus and probable mediastinoscopy To to obtain a tissue diagnosis attempt, in a patient who is high risk for carcinoma the lung       Delight Ovens MD  Beeper [phone] Office [phone] 05/10/2012 4:26 PM

## 2012-05-15 LAB — FUNGUS CULTURE W SMEAR

## 2012-05-18 ENCOUNTER — Telehealth: Payer: Self-pay | Admitting: Emergency Medicine

## 2012-05-18 NOTE — Telephone Encounter (Signed)
Spoke with pt. He states wants to know why Dr. Sena Hitch has referred him to cardiology since he already has a lot of cardiac testing done at [facility]. I advised will need to call and ask Dr. Humberto Seals office and ask them this. I gave him the number to call and he stated nothing further needed.

## 2012-05-23 ENCOUNTER — Ambulatory Visit (INDEPENDENT_AMBULATORY_CARE_PROVIDER_SITE_OTHER): Payer: Medicare Other | Admitting: Cardiology

## 2012-05-23 ENCOUNTER — Encounter: Payer: Self-pay | Admitting: Cardiology

## 2012-05-23 VITALS — BP 160/82 | HR 54 | Ht 74.0 in | Wt 227.0 lb

## 2012-05-23 DIAGNOSIS — I10 Essential (primary) hypertension: Secondary | ICD-10-CM

## 2012-05-23 DIAGNOSIS — Z01810 Encounter for preprocedural cardiovascular examination: Secondary | ICD-10-CM | POA: Insufficient documentation

## 2012-05-23 DIAGNOSIS — Z01818 Encounter for other preprocedural examination: Secondary | ICD-10-CM

## 2012-05-23 DIAGNOSIS — R079 Chest pain, unspecified: Secondary | ICD-10-CM

## 2012-05-23 NOTE — Assessment & Plan Note (Signed)
Symptoms atypical and electrocardiogram normal. Plan Myoview for risk stratification.

## 2012-05-23 NOTE — Patient Instructions (Addendum)
Your physician recommends that you schedule a follow-up appointment in:  AS NEEDED PENDING TEST RESULTS  Your physician has requested that you have a lexiscan myoview. For further information please visit [email or web address]. Please follow instruction sheet, as given.   

## 2012-05-23 NOTE — Assessment & Plan Note (Signed)
Plan Lexiscan my view for risk stratification preoperatively. If negative he may proceed with his surgery.

## 2012-05-23 NOTE — Assessment & Plan Note (Signed)
Blood pressure mildly elevated. Continue beta blocker. He will followup with primary care and additional medications can be added as needed.

## 2012-05-23 NOTE — Progress Notes (Signed)
HPI: 71 year old male with no prior cardiac history for preoperative evaluation prior to lung surgery and for evaluation of chest pain. Patient has chronic dyspnea on exertion but no orthopnea, PND or pedal edema. He occasionally has chest pain that is in the left breast area. It does not radiate. It is not exertional or pleuritic. No associated symptoms. Last less than 1 minute and resolves spontaneously. He does not have exertional chest pain. No claudication or syncope. He is scheduled to have lung surgery for possible malignancy. We were asked to evaluate preoperatively.  Current Outpatient Prescriptions  Medication Sig Dispense Refill  . atenolol (TENORMIN) 100 MG tablet Take 100 mg by mouth 2 (two) times daily.      Marland Kitchen dexlansoprazole (DEXILANT) 60 MG capsule Take 60 mg by mouth daily. PRN when Prilosec is done      . omeprazole (PRILOSEC) 20 MG capsule Take 20 mg by mouth 2 (two) times daily.      Marland Kitchen oxyCODONE-acetaminophen (PERCOCET) 10-325 MG per tablet Take 1 tablet by mouth every 4 (four) hours as needed.       Marland Kitchen PARoxetine (PAXIL) 10 MG tablet Take 10 mg by mouth daily.      . Tamsulosin HCl (FLOMAX) 0.4 MG CAPS Take 0.4 mg by mouth 2 (two) times daily.        Allergies  Allergen Reactions  . Codeine   . Hydrocod Polst-Cpm Polst Er   . Ketorolac Tromethamine   . Tramadol     Past Medical History  Diagnosis Date  . Hypertension   . Allergic rhinitis   . BPH (benign prostatic hyperplasia)   . GERD (gastroesophageal reflux disease)   . COPD (chronic obstructive pulmonary disease)     Past Surgical History  Procedure Date  . Ankle surgery 2005  . Prostate surgery 11/25/2010  . Hernia repair     History   Social History  . Marital Status: Divorced    Spouse Name: N/A    Number of Children: 2  . Years of Education: N/A   Occupational History  . RETIRED     worked at a service station   Social History Main Topics  . Smoking status: Former Smoker -- 2.0 packs/day  for 50 years    Types: Cigarettes    Quit date: 12/01/2010  . Smokeless tobacco: Not on file  . Alcohol Use: Yes     5 beers every night  . Drug Use: No  . Sexually Active: Not on file   Other Topics Concern  . Not on file   Social History Narrative  . No narrative on file    Family History  Problem Relation Age of Onset  . Heart disease Mother   . Arthritis Sister   . Arthritis Sister   . Liver cancer Father     ROS: chronic neck and back pain but no fevers or chills, productive cough, hemoptysis, dysphasia, odynophagia, melena, hematochezia, dysuria, hematuria, rash, seizure activity, orthopnea, PND, pedal edema, claudication. Remaining systems are negative.  Physical Exam:   Blood pressure 160/82, pulse 54, height 6\' 2"  (1.88 m), weight 102.967 kg (227 lb).  General:  Well developed/well nourished in NAD Skin warm/dry Patient not depressed No peripheral clubbing Back-normal HEENT-normal/normal eyelids Neck supple/normal carotid upstroke bilaterally; no bruits; no JVD; no thyromegaly chest - CTA/ normal expansion CV - RRR/normal S1 and S2; no murmurs, rubs or gallops;  PMI nondisplaced Abdomen -NT/ND, no HSM, no mass, + bowel sounds, no bruit 2+ femoral pulses,  no bruits Ext-no edema, chords, 2+ DP Neuro-grossly nonfocal  ECG sinus rhythm at a rate of 54. No ST changes.

## 2012-05-24 ENCOUNTER — Ambulatory Visit (HOSPITAL_COMMUNITY): Payer: Medicare Other | Attending: Cardiology | Admitting: Radiology

## 2012-05-24 VITALS — BP 150/74 | Ht 74.0 in | Wt 222.0 lb

## 2012-05-24 DIAGNOSIS — R5383 Other fatigue: Secondary | ICD-10-CM | POA: Insufficient documentation

## 2012-05-24 DIAGNOSIS — Z87891 Personal history of nicotine dependence: Secondary | ICD-10-CM | POA: Insufficient documentation

## 2012-05-24 DIAGNOSIS — I10 Essential (primary) hypertension: Secondary | ICD-10-CM | POA: Insufficient documentation

## 2012-05-24 DIAGNOSIS — R0989 Other specified symptoms and signs involving the circulatory and respiratory systems: Secondary | ICD-10-CM | POA: Insufficient documentation

## 2012-05-24 DIAGNOSIS — Z8249 Family history of ischemic heart disease and other diseases of the circulatory system: Secondary | ICD-10-CM | POA: Insufficient documentation

## 2012-05-24 DIAGNOSIS — J438 Other emphysema: Secondary | ICD-10-CM | POA: Insufficient documentation

## 2012-05-24 DIAGNOSIS — Z01818 Encounter for other preprocedural examination: Secondary | ICD-10-CM

## 2012-05-24 DIAGNOSIS — R079 Chest pain, unspecified: Secondary | ICD-10-CM | POA: Insufficient documentation

## 2012-05-24 DIAGNOSIS — R0609 Other forms of dyspnea: Secondary | ICD-10-CM | POA: Insufficient documentation

## 2012-05-24 DIAGNOSIS — R5381 Other malaise: Secondary | ICD-10-CM | POA: Insufficient documentation

## 2012-05-24 DIAGNOSIS — R0602 Shortness of breath: Secondary | ICD-10-CM | POA: Insufficient documentation

## 2012-05-24 DIAGNOSIS — R42 Dizziness and giddiness: Secondary | ICD-10-CM | POA: Insufficient documentation

## 2012-05-24 MED ORDER — REGADENOSON 0.4 MG/5ML IV SOLN
0.4000 mg | Freq: Once | INTRAVENOUS | Status: AC
  Administered 2012-05-24: 0.4 mg via INTRAVENOUS

## 2012-05-24 MED ORDER — TECHNETIUM TC 99M TETROFOSMIN IV KIT
30.0000 | PACK | Freq: Once | INTRAVENOUS | Status: AC | PRN
  Administered 2012-05-24: 30 via INTRAVENOUS

## 2012-05-24 NOTE — Progress Notes (Signed)
[facility] SITE 3 NUCLEAR MED [address] [place] [address] [phone]  Cardiology Nuclear Med Study  Tyler [name] is a 71 y.o. male     MRN : [id number]     DOB: 08/20/1941  Procedure Date: 05/24/2012  Nuclear Med Background Indication for Stress Test:  Evaluation for Ischemia, and Pending Surgical Clearance for  Lung Surgery  by Sheliah Plane, MD History:  COPD and Emphysema Cardiac Risk Factors: Family History - CAD, History of Smoking and Hypertension  Symptoms:  Chest Pain, DOE, Fatigue, Light-Headedness and SOB   Nuclear Pre-Procedure Caffeine/Decaff Intake:  None > 12 hrs NPO After: 9:00pm   Lungs:  clear O2 Sat: 96% on room air. IV 0.9% NS with Angio Cath:  20g  IV Site: R Antecubital x 1, tolerated well IV Started by:  Irean Hong, RN  Chest Size (in):  46 Cup Size: n/a  Height: 6\' 2"  (1.88 m)  Weight:  222 lb (100.699 kg)  BMI:  Body mass index is 28.50 kg/(m^2). Tech Comments:  Held Atenolol x 24 hrs    Nuclear Med Study 1 or 2 day study: 2 day  Stress Test Type:  Lexiscan  Reading MD: Marca Ancona, MD  Order Authorizing Provider:  Olga Millers, MD  Resting Radionuclide: Technetium 29m Tetrofosmin  Resting Radionuclide Dose: 33.0 mCi  05/28/12  Stress Radionuclide:  Technetium 13m Tetrofosmin  Stress Radionuclide Dose: 33.0 mCi  05/24/12          Stress Protocol Rest HR: 53 Stress HR: 68  Rest BP: 150/74 Stress BP: 179/84  Exercise Time (min): n/a METS: n/a   Predicted Max HR: 150 bpm % Max HR: 45.33 bpm Rate Pressure Product: 02725   Dose of Adenosine (mg):  n/a Dose of Lexiscan: 0.4 mg  Dose of Atropine (mg): n/a Dose of Dobutamine: n/a mcg/kg/min (at max HR)  Stress Test Technologist: Milana Na, EMT-P  Nuclear Technologist:  Domenic Polite, CNMT     Rest Procedure:  Myocardial perfusion imaging was performed at rest 45 minutes following the intravenous administration of Technetium 40m Tetrofosmin. Rest ECG:  Sinus Bradycardia with 1st degree AVB  Stress Procedure:  The patient received IV Lexiscan 0.4 mg over 15-seconds.  Technetium 15m Tetrofosmin injected at 30-seconds.  There were no significant changes with Lexiscan.  Quantitative spect images were obtained after a 45 minute delay. Stress ECG: No significant change from baseline ECG  QPS Raw Data Images:  Acquisition technically good; normal left ventricular size. Stress Images:  Normal homogeneous uptake in all areas of the myocardium. Rest Images:  Normal homogeneous uptake in all areas of the myocardium. Subtraction (SDS):  No evidence of ischemia. Transient Ischemic Dilatation (Normal <1.22):  1.00 Lung/Heart Ratio (Normal <0.45):  0.48  Quantitative Gated Spect Images QGS EDV:  130 ml QGS ESV:  51 ml  Impression Exercise Capacity:  Lexiscan with no exercise. BP Response:  Normal blood pressure response. Clinical Symptoms:  There is dyspnea. ECG Impression:  No significant ST segment change suggestive of ischemia. Comparison with Prior Nuclear Study: No previous nuclear study performed  Overall Impression:  Normal stress nuclear study.  LV Ejection Fraction: 60%.  LV Wall Motion:  NL LV Function; NL Wall Motion      Olga Millers

## 2012-05-28 ENCOUNTER — Ambulatory Visit (HOSPITAL_COMMUNITY): Payer: Medicare Other | Attending: Cardiology

## 2012-05-28 DIAGNOSIS — R0989 Other specified symptoms and signs involving the circulatory and respiratory systems: Secondary | ICD-10-CM

## 2012-05-28 MED ORDER — TECHNETIUM TC 99M TETROFOSMIN IV KIT
33.0000 | PACK | Freq: Once | INTRAVENOUS | Status: AC | PRN
  Administered 2012-05-28: 33 via INTRAVENOUS

## 2012-05-29 ENCOUNTER — Telehealth: Payer: Self-pay | Admitting: Cardiology

## 2012-05-29 NOTE — Telephone Encounter (Signed)
Fu call °Pt returning your call  °

## 2012-05-29 NOTE — Telephone Encounter (Signed)
Spoke with pt, aware of normal myoview results

## 2012-05-31 LAB — AFB CULTURE WITH SMEAR (NOT AT ARMC)

## 2012-06-05 ENCOUNTER — Encounter: Payer: Medicare Other | Admitting: Emergency Medicine

## 2012-06-06 ENCOUNTER — Other Ambulatory Visit: Payer: Self-pay

## 2012-06-06 ENCOUNTER — Encounter (HOSPITAL_COMMUNITY): Payer: Self-pay | Admitting: Pharmacy Technician

## 2012-06-06 DIAGNOSIS — R599 Enlarged lymph nodes, unspecified: Secondary | ICD-10-CM

## 2012-06-07 NOTE — Pre-Procedure Instructions (Signed)
20 Tyler [name]  06/07/2012   Your procedure is scheduled on:  06/12/12  Report to [facility] at 530 AM.  Call this number if you have problems the morning of surgery: [phone]   Remember:   Do not eat food:After Midnight.    Take these medicines the morning of surgery with A SIP OF WATER: atenolol,dexilant,prilosec,vistaril,percocet,flomax   Do not wear jewelry, make-up or nail polish.  Do not wear lotions, powders, or perfumes. You may wear deodorant.  Do not shave 48 hours prior to surgery. Men may shave face and neck.  Do not bring valuables to the hospital.  Contacts, dentures or bridgework may not be worn into surgery.  Leave suitcase in the car. After surgery it may be brought to your room.  For patients admitted to the hospital, checkout time is 11:00 AM the day of discharge.   Patients discharged the day of surgery will not be allowed to drive home.  Name and phone number of your driver: family  Special Instructions: CHG Shower Use Special Wash: 1/2 bottle night before surgery and 1/2 bottle morning of surgery.   Please read over the following fact sheets that you were given: Pain Booklet, Coughing and Deep Breathing, Blood Transfusion Information, MRSA Information and Surgical Site Infection Prevention

## 2012-06-08 ENCOUNTER — Encounter (HOSPITAL_COMMUNITY)
Admission: RE | Admit: 2012-06-08 | Discharge: 2012-06-08 | Disposition: A | Discharge status: Home / Self Care | Payer: Medicare Other | Source: Ambulatory Visit | Attending: Cardiothoracic Surgery | Admitting: Cardiothoracic Surgery

## 2012-06-08 ENCOUNTER — Encounter (HOSPITAL_COMMUNITY): Payer: Self-pay

## 2012-06-08 VITALS — BP 130/72 | HR 50 | Temp 98.1°F | Resp 20 | Ht 74.0 in | Wt 227.0 lb

## 2012-06-08 DIAGNOSIS — R599 Enlarged lymph nodes, unspecified: Secondary | ICD-10-CM

## 2012-06-08 HISTORY — DX: Unspecified osteoarthritis, unspecified site: M19.90

## 2012-06-08 LAB — CBC
HCT: 39.9 % (ref 39.0–52.0)
Hemoglobin: 13.4 g/dL (ref 13.0–17.0)
MCH: 32 pg (ref 26.0–34.0)
MCHC: 33.6 g/dL (ref 30.0–36.0)
MCV: 95.2 fL (ref 78.0–100.0)
Platelets: 164 10*3/uL (ref 150–400)
RBC: 4.19 MIL/uL — ABNORMAL LOW (ref 4.22–5.81)
RDW: 12.9 % (ref 11.5–15.5)
WBC: 7.2 10*3/uL (ref 4.0–10.5)

## 2012-06-08 LAB — COMPREHENSIVE METABOLIC PANEL
ALT: 9 U/L (ref 0–53)
AST: 15 U/L (ref 0–37)
Albumin: 3.4 g/dL — ABNORMAL LOW (ref 3.5–5.2)
Alkaline Phosphatase: 107 U/L (ref 39–117)
BUN: 14 mg/dL (ref 6–23)
CO2: 28 mEq/L (ref 19–32)
Calcium: 9.4 mg/dL (ref 8.4–10.5)
Chloride: 103 mEq/L (ref 96–112)
Creatinine, Ser: 0.93 mg/dL (ref 0.50–1.35)
GFR calc Af Amer: >90 mL/min (ref >90)
GFR calc non Af Amer: 83 mL/min — ABNORMAL LOW (ref >90)
Glucose, Bld: 90 mg/dL (ref 70–99)
Potassium: 4.2 mEq/L (ref 3.5–5.1)
Sodium: 139 mEq/L (ref 135–145)
Total Bilirubin: 0.3 mg/dL (ref 0.3–1.2)
Total Protein: 7.1 g/dL (ref 6.0–8.3)

## 2012-06-08 LAB — PROTIME-INR
INR: 0.99 (ref 0.00–1.49)
Prothrombin Time: 13.3 seconds (ref 11.6–15.2)

## 2012-06-08 LAB — SURGICAL PCR SCREEN
MRSA, PCR: NEGATIVE
Staphylococcus aureus: NEGATIVE

## 2012-06-08 LAB — APTT: aPTT: 32 seconds (ref 24–37)

## 2012-06-08 LAB — TYPE AND SCREEN
ABO/RH(D): O POS
Antibody Screen: NEGATIVE

## 2012-06-08 LAB — ABO/RH: ABO/RH(D): O POS

## 2012-06-08 NOTE — Progress Notes (Signed)
Stress test in epic. Last progress note with Dr Jens Som in epic.

## 2012-06-10 MED ORDER — DEXTROSE 5 % IV SOLN
1.5000 g | INTRAVENOUS | Status: AC
  Administered 2012-06-11: 1.5 g via INTRAVENOUS
  Filled 2012-06-10: qty 1.5

## 2012-06-11 ENCOUNTER — Ambulatory Visit (HOSPITAL_COMMUNITY): Payer: Medicare Other | Admitting: Anesthesiology

## 2012-06-11 ENCOUNTER — Encounter (HOSPITAL_COMMUNITY): Payer: Self-pay | Admitting: Anesthesiology

## 2012-06-11 ENCOUNTER — Ambulatory Visit (HOSPITAL_COMMUNITY): Payer: Medicare Other

## 2012-06-11 ENCOUNTER — Ambulatory Visit (HOSPITAL_COMMUNITY)
Admission: RE | Admit: 2012-06-11 | Discharge: 2012-06-11 | Disposition: A | Discharge status: Home / Self Care | Payer: Medicare Other | Source: Ambulatory Visit | Attending: Cardiothoracic Surgery | Admitting: Cardiothoracic Surgery

## 2012-06-11 ENCOUNTER — Encounter (HOSPITAL_COMMUNITY): Payer: Self-pay | Admitting: Surgery

## 2012-06-11 ENCOUNTER — Encounter (HOSPITAL_COMMUNITY)
Admission: RE | Disposition: A | Discharge status: Home / Self Care | Payer: Self-pay | Source: Ambulatory Visit | Attending: Cardiothoracic Surgery

## 2012-06-11 DIAGNOSIS — R079 Chest pain, unspecified: Secondary | ICD-10-CM | POA: Insufficient documentation

## 2012-06-11 DIAGNOSIS — R599 Enlarged lymph nodes, unspecified: Secondary | ICD-10-CM

## 2012-06-11 DIAGNOSIS — R222 Localized swelling, mass and lump, trunk: Secondary | ICD-10-CM | POA: Insufficient documentation

## 2012-06-11 DIAGNOSIS — R634 Abnormal weight loss: Secondary | ICD-10-CM | POA: Insufficient documentation

## 2012-06-11 DIAGNOSIS — K219 Gastro-esophageal reflux disease without esophagitis: Secondary | ICD-10-CM | POA: Insufficient documentation

## 2012-06-11 DIAGNOSIS — I10 Essential (primary) hypertension: Secondary | ICD-10-CM | POA: Insufficient documentation

## 2012-06-11 DIAGNOSIS — J4489 Other specified chronic obstructive pulmonary disease: Secondary | ICD-10-CM | POA: Insufficient documentation

## 2012-06-11 DIAGNOSIS — J449 Chronic obstructive pulmonary disease, unspecified: Secondary | ICD-10-CM | POA: Insufficient documentation

## 2012-06-11 DIAGNOSIS — E119 Type 2 diabetes mellitus without complications: Secondary | ICD-10-CM | POA: Insufficient documentation

## 2012-06-11 DIAGNOSIS — R0602 Shortness of breath: Secondary | ICD-10-CM | POA: Insufficient documentation

## 2012-06-11 HISTORY — PX: MEDIASTINOSCOPY: SHX5086

## 2012-06-11 SURGERY — MEDIASTINOSCOPY
Anesthesia: General | Wound class: Clean Contaminated

## 2012-06-11 MED ORDER — SUCCINYLCHOLINE CHLORIDE 20 MG/ML IJ SOLN
INTRAMUSCULAR | Status: DC | PRN
  Administered 2012-06-11: 100 mg via INTRAVENOUS

## 2012-06-11 MED ORDER — ONDANSETRON HCL 4 MG/2ML IJ SOLN: 4.0000 mg | Freq: Once | INTRAMUSCULAR | Status: DC | PRN

## 2012-06-11 MED ORDER — NEOSTIGMINE METHYLSULFATE 1 MG/ML IJ SOLN
INTRAMUSCULAR | Status: DC | PRN
  Administered 2012-06-11: 2 mg via INTRAVENOUS

## 2012-06-11 MED ORDER — LACTATED RINGERS IV SOLN
INTRAVENOUS | Status: DC | PRN
  Administered 2012-06-11 (×2): via INTRAVENOUS

## 2012-06-11 MED ORDER — EPHEDRINE SULFATE 50 MG/ML IJ SOLN
INTRAMUSCULAR | Status: DC | PRN
  Administered 2012-06-11 (×2): 10 mg via INTRAVENOUS

## 2012-06-11 MED ORDER — GLYCOPYRROLATE 0.2 MG/ML IJ SOLN
INTRAMUSCULAR | Status: DC | PRN
  Administered 2012-06-11: 0.2 mg via INTRAVENOUS
  Administered 2012-06-11: .2 mg via INTRAVENOUS
  Administered 2012-06-11: .4 mg via INTRAVENOUS

## 2012-06-11 MED ORDER — LIDOCAINE HCL (CARDIAC) 20 MG/ML IV SOLN
INTRAVENOUS | Status: DC | PRN
  Administered 2012-06-11: 80 mg via INTRAVENOUS

## 2012-06-11 MED ORDER — 0.9 % SODIUM CHLORIDE (POUR BTL) OPTIME
TOPICAL | Status: DC | PRN
  Administered 2012-06-11: 1000 mL

## 2012-06-11 MED ORDER — FENTANYL CITRATE 0.05 MG/ML IJ SOLN
INTRAMUSCULAR | Status: DC | PRN
  Administered 2012-06-11: 50 ug via INTRAVENOUS
  Administered 2012-06-11: 100 ug via INTRAVENOUS
  Administered 2012-06-11: 50 ug via INTRAVENOUS

## 2012-06-11 MED ORDER — PROPOFOL 10 MG/ML IV BOLUS
INTRAVENOUS | Status: DC | PRN
  Administered 2012-06-11: 200 mg via INTRAVENOUS
  Administered 2012-06-11: 50 mg via INTRAVENOUS
  Administered 2012-06-11: 25 mg via INTRAVENOUS

## 2012-06-11 MED ORDER — ONDANSETRON HCL 4 MG/2ML IJ SOLN
INTRAMUSCULAR | Status: DC | PRN
  Administered 2012-06-11: 4 mg via INTRAVENOUS

## 2012-06-11 MED ORDER — ROCURONIUM BROMIDE 100 MG/10ML IV SOLN
INTRAVENOUS | Status: DC | PRN
  Administered 2012-06-11 (×2): 20 mg via INTRAVENOUS

## 2012-06-11 MED ORDER — HYDROMORPHONE HCL PF 1 MG/ML IJ SOLN: 0.2500 mg | INTRAMUSCULAR | Status: DC | PRN

## 2012-06-11 SURGICAL SUPPLY — 55 items
BLADE SURG 10 STRL SS (BLADE) ×2 IMPLANT
BRUSH CYTOL CELLEBRITY 1.5X140 (MISCELLANEOUS) IMPLANT
CANISTER SUCTION 2500CC (MISCELLANEOUS) ×4 IMPLANT
CLIP TI MEDIUM 6 (CLIP) IMPLANT
CLOTH BEACON ORANGE TIMEOUT ST (SAFETY) ×4 IMPLANT
CONT SPEC 4OZ CLIKSEAL STRL BL (MISCELLANEOUS) ×8 IMPLANT
COVER SURGICAL LIGHT HANDLE (MISCELLANEOUS) ×2 IMPLANT
COVER TABLE BACK 60X90 (DRAPES) ×2 IMPLANT
DERMABOND ADHESIVE PROPEN (GAUZE/BANDAGES/DRESSINGS) ×1
DERMABOND ADVANCED (GAUZE/BANDAGES/DRESSINGS) ×1
DERMABOND ADVANCED .7 DNX12 (GAUZE/BANDAGES/DRESSINGS) ×1 IMPLANT
DERMABOND ADVANCED .7 DNX6 (GAUZE/BANDAGES/DRESSINGS) ×1 IMPLANT
DRAPE LAPAROTOMY T 102X78X121 (DRAPES) ×2 IMPLANT
ELECT CAUTERY BLADE 6.4 (BLADE) ×2 IMPLANT
ELECT REM PT RETURN 9FT ADLT (ELECTROSURGICAL) ×2
ELECTRODE REM PT RTRN 9FT ADLT (ELECTROSURGICAL) ×1 IMPLANT
FORCEPS BIOP RJ4 1.8 (CUTTING FORCEPS) IMPLANT
GAUZE SPONGE 4X4 16PLY XRAY LF (GAUZE/BANDAGES/DRESSINGS) ×2 IMPLANT
GLOVE BIO SURGEON STRL SZ 6.5 (GLOVE) ×2 IMPLANT
GLOVE BIO SURGEON STRL SZ7.5 (GLOVE) ×2 IMPLANT
GLOVE BIOGEL PI IND STRL 6.5 (GLOVE) ×2 IMPLANT
GLOVE BIOGEL PI INDICATOR 6.5 (GLOVE) ×2
GLOVE ECLIPSE 6.5 STRL STRAW (GLOVE) ×2 IMPLANT
GLOVE EUDERMIC 7 POWDERFREE (GLOVE) ×2 IMPLANT
GLOVE SURG SIGNA 7.5 PF LTX (GLOVE) IMPLANT
GOWN STRL NON-REIN LRG LVL3 (GOWN DISPOSABLE) ×4 IMPLANT
HEMOSTAT SURGICEL 2X14 (HEMOSTASIS) IMPLANT
KIT BASIN OR (CUSTOM PROCEDURE TRAY) ×2 IMPLANT
KIT ROOM TURNOVER OR (KITS) ×4 IMPLANT
MARKER SKIN DUAL TIP RULER LAB (MISCELLANEOUS) ×2 IMPLANT
NEEDLE BIOPSY TRANSBRONCH 21G (NEEDLE) IMPLANT
NEEDLE SYS SONOTIP II EBUSTBNA (NEEDLE) ×6 IMPLANT
NS IRRIG 1000ML POUR BTL (IV SOLUTION) ×4 IMPLANT
OIL SILICONE PENTAX (PARTS (SERVICE/REPAIRS)) ×2 IMPLANT
PACK SURGICAL SETUP 50X90 (CUSTOM PROCEDURE TRAY) ×2 IMPLANT
PAD ARMBOARD 7.5X6 YLW CONV (MISCELLANEOUS) ×8 IMPLANT
PENCIL BUTTON HOLSTER BLD 10FT (ELECTRODE) ×2 IMPLANT
PENTAX SCOPE BALLOON ×4 IMPLANT
SPONGE GAUZE 4X4 12PLY (GAUZE/BANDAGES/DRESSINGS) ×4 IMPLANT
SPONGE INTESTINAL PEANUT (DISPOSABLE) ×2 IMPLANT
STAPLER VISISTAT 35W (STAPLE) IMPLANT
STOPCOCK 4 WAY LG BORE MALE ST (IV SETS) ×2 IMPLANT
SUT VIC AB 3-0 SH 18 (SUTURE) ×2 IMPLANT
SUT VICRYL 4-0 PS2 18IN ABS (SUTURE) ×2 IMPLANT
SWAB COLLECTION DEVICE MRSA (MISCELLANEOUS) IMPLANT
SYR 20CC LL (SYRINGE) ×2 IMPLANT
SYR 20ML ECCENTRIC (SYRINGE) ×4 IMPLANT
SYR 5ML LUER SLIP (SYRINGE) IMPLANT
SYRINGE 10CC LL (SYRINGE) ×2 IMPLANT
TOWEL OR 17X24 6PK STRL BLUE (TOWEL DISPOSABLE) ×4 IMPLANT
TOWEL OR 17X26 10 PK STRL BLUE (TOWEL DISPOSABLE) ×2 IMPLANT
TRAP SPECIMEN MUCOUS 40CC (MISCELLANEOUS) ×2 IMPLANT
TUBE ANAEROBIC SPECIMEN COL (MISCELLANEOUS) IMPLANT
TUBE CONNECTING 12X1/4 (SUCTIONS) ×4 IMPLANT
WATER STERILE IRR 1000ML POUR (IV SOLUTION) IMPLANT

## 2012-06-11 NOTE — Anesthesia Postprocedure Evaluation (Signed)
  Anesthesia Post-op Note  Patient: Tyler [name]  Procedure(s) Performed: Procedure(s) (LRB): VIDEO BRONCHOSCOPY WITH ENDOBRONCHIAL ULTRASOUND (N/A) MEDIASTINOSCOPY (N/A)  Patient Location: PACU  Anesthesia Type: General  Level of Consciousness: awake, alert , oriented and patient cooperative  Airway and Oxygen Therapy: Patient Spontanous Breathing and Patient connected to nasal cannula oxygen  Post-op Pain: mild  Post-op Assessment: Post-op Vital signs reviewed, Patient's Cardiovascular Status Stable, Respiratory Function Stable, Patent Airway, No signs of Nausea or vomiting and Pain level controlled  Post-op Vital Signs: stable  Complications: No apparent anesthesia complications

## 2012-06-11 NOTE — Consult Note (Signed)
HPI  71 yo former heavy smoker, hx HTN, DM, allergies. Has been dx with COPD about 2 weeks ago, started on Advair but only uses it about once a week. Has been rx for PNA in the past, including as a child. Was originally referred for abnormal CXR and CT scan chest by Dr Clarene Duke in 4/13. He was well until February when he developed gastroenteritis w Nausea, diarrhea. Finally got better but he had poor appetite, lost about 20 lbs over 2 months. The CT scan 4/13 showed a L hilar area that looks like scar vs bronchiectasis. A subsequent PET showed that the L hilar area was hypermetabolic. He went for EBUS in 5/13 that was negative for malignancy. Given suspicion for possible primary lung cancer, he now presents for repeat EBUS and, if unrevealing, mediastinoscopy by Dr Tyrone Sage. He underwent cardiac screening by Dr Jens Som and was deemed appropriate to undergo general anesthesia.    Past Medical History   Diagnosis  Date   .  Hypertension    .  Diabetes mellitus    .  Allergic rhinitis     Family History   Problem  Relation  Age of Onset   .  Heart disease  Mother    .  Arthritis  Sister    .  Arthritis  Sister    .  Liver cancer  Father     History    Social History   .  Marital Status:  Divorced     Spouse Name:  N/A     Number of Children:  N/A   .  Years of Education:  N/A    Occupational History   .  RETIRED      worked at a service station    Social History Main Topics   .  Smoking status:  Former Smoker -- 2.0 packs/day for 50 years     Types:  Cigarettes     Quit date:  12/01/2010   .  Smokeless tobacco:  Not on file   .  Alcohol Use:  Yes      4 beers every night   .  Drug Use:  No   .  Sexually Active:  Not on file    Other Topics  Concern   .  Not on file    Social History Narrative   .  No narrative on file    Allergies   Allergen  Reactions   .  Codeine    .  Toradol    .  Tramadol    .  Tussionex Pennkinetic Er     No outpatient prescriptions prior to  visit.     Objective:   Physical Exam  Temp:  [98.3 F (36.8 C)] 98.3 F (36.8 C) (08/12 0555) Pulse Rate:  [51] 51  (08/12 0555) Resp:  [18] 18  (08/12 0555) BP: (163)/(89) 163/89 mmHg (08/12 0555) SpO2:  [95 %] 95 % (08/12 0555)  Gen: Pleasant, well-nourished, in no distress, normal affect  ENT: No lesions, mouth clear, oropharynx clear, no postnasal drip  Neck: No JVD, no TMG, no carotid bruits  Lungs: No use of accessory muscles, no dullness to percussion, clear without rales or rhonchi  Cardiovascular: RRR, heart sounds normal, no murmur or gallops, no peripheral edema  Musculoskeletal: No deformities, no cyanosis or clubbing  Neuro: alert, non focal  Skin: Warm, no lesions or rashes   Assessment & Plan:    Abnormal CT scan, chest  With L hilar PET-positive  lesion.  - repeat EBUS today - proceed to mediastinoscopy if EBUS frozen sections negative  [name], MD, PhD 06/11/2012, 7:29 AM [facility] [phone] or if no answer [phone]

## 2012-06-11 NOTE — Anesthesia Procedure Notes (Signed)
Procedure Name: Intubation Date/Time: 06/11/2012 7:31 AM Performed by: Marena Chancy Pre-anesthesia Checklist: Patient identified, Timeout performed, Emergency Drugs available, Suction available and Patient being monitored Patient Re-evaluated:Patient Re-evaluated prior to inductionOxygen Delivery Method: Circle system utilized Preoxygenation: Pre-oxygenation with 100% oxygen Intubation Type: IV induction Laryngoscope Size: Miller and 2 Grade View: Grade II Tube type: Oral Tube size: 8.5 mm Number of attempts: 1 Placement Confirmation: ETT inserted through vocal cords under direct vision,  positive ETCO2 and breath sounds checked- equal and bilateral Secured at: 24 cm Tube secured with: Tape Dental Injury: Teeth and Oropharynx as per pre-operative assessment

## 2012-06-11 NOTE — H&P (Signed)
[address]            [place] [id number]          [phone]        COLLIER BOHNET [facility] Medical Record #[id number] Date of Birth: [birth year]  Referring: Dr Delton Coombes Primary Care: Aida Puffer, MD  Chief Complaint:    Wt loss   History of Present Illness:    71 yo former heavy smoker who had a CT scan of the chest done as part of evaluation for unexplained weight loss. A PET scan was also performed. Dr. Delton Coombes perform bronchoscopy and EBUS, but without any pathologic findings. The patient is referred for consideration of repeat ebus and or  mediastinoscopy. Patient comes to the office today complaining of chest pain on and off almost daily for a month he notes that he gets short of breath walking to his mailbox, at times he has had an unexplained diaphoresis. The symptoms are made worse with exertion and after eating. He also describes reflux symptoms and bitter taste in his mouth but this is separate set of symptoms than the chest pain.  Since seen in the office patient has seen his cardiologist Dr Jens Som for operative clearence.  Current Activity/ Functional Status: Patient is independent with mobility/ambulation, transfers, ADL's, IADL's.   Past Medical History  Diagnosis Date  . Allergic rhinitis   . BPH (benign prostatic hyperplasia)   . GERD (gastroesophageal reflux disease)   . COPD (chronic obstructive pulmonary disease)   . Shortness of breath   . Arthritis   . Enlarged prostate   . Hypertension     dr Shon Hale [place] in [place]   pcp      dr Jens Som  cardiac md    Past Surgical History  Procedure Date  . Ankle surgery 2005  . Prostate surgery 11/25/2010  . Hernia repair     Family History  Problem Relation Age of Onset  . Heart disease Mother   . Arthritis Sister   . Arthritis Sister   . Liver cancer Father     History   Social History  . Marital Status: Single    Spouse Name: N/A    Number of Children: 2  .  Years of Education: N/A   Occupational History  . RETIRED     worked at a service station   Social History Main Topics  . Smoking status: Former Smoker -- 2.0 packs/day for 50 years    Types: Cigarettes    Quit date: 12/01/2010  . Smokeless tobacco: Not on file  . Alcohol Use: Yes     5 beers every night  . Drug Use: No  . Sexually Active: Not on file   Other Topics Concern  . Not on file   Social History Narrative  . No narrative on file    History  Smoking status  . Former Smoker -- 2.0 packs/day for 50 years  . Types: Cigarettes  . Quit date: 12/01/2010  Smokeless tobacco  . Not on file    History  Alcohol Use  . Yes    5 beers every night     Allergies  Allergen Reactions  . Codeine Itching  . Hydrocod Polst-Cpm Polst Er   . Ketorolac Tromethamine   . Tramadol     Current Facility-Administered Medications  Medication Dose Route Frequency Provider Last Rate Last Dose  .  cefUROXime (ZINACEF) 1.5 g in dextrose 5 % 50 mL IVPB  1.5 g Intravenous 60 min Pre-Op Delight Ovens, MD       Facility-Administered Medications Ordered in Other Encounters  Medication Dose Route Frequency Provider Last Rate Last Dose  . lactated ringers infusion    Continuous PRN Marena Chancy, CRNA           Review of Systems:     Cardiac Review of Systems: Y or N  Chest Pain [ y   ]  Resting SOB [n   ] Exertional SOB  Cove.Etienne  ]  Orthopnea [ n ]   Pedal Edema [ n  ]    Palpitations [ n ] Syncope  [ n ]   Presyncope [ n  ]  General Review of Systems: [Y] = yes [  ]=no Constitional: recent weight change [loss  ]; anorexia [  ]; fatigue Cove.Etienne  ]; nausea [ y ]; night sweats Cove.Etienne  ]; fever [  ]; or chills [  ];                                                                                                                                          Dental: poor dentition[ n ];   Eye : blurred vision [  ]; diplopia [   ]; vision changes [  ];  Amaurosis fugax[  ]; Resp: cough [  ];   wheezing[  ];  hemoptysis[  ]; shortness of breath[  ]; paroxysmal nocturnal dyspnea[  ]; dyspnea on exertion[  ]; or orthopnea[  ];  GI:  gallstones[  ], vomiting[  ];  dysphagia[  ]; melena[  ];  hematochezia [  ]; heartburn[ y ];   Hx of  Colonoscopy[n  ]; GU: kidney stones [  ]; hematuria[  ];   dysuria [  ];  nocturia[  ];  history of     obstruction [  ];             Skin: rash, swelling[  ];, hair loss[  ];  peripheral edema[  ];  or itching[  ]; Musculosketetal: myalgias[  ];  joint swelling[  ];  joint erythema[  ];  joint pain[y  ];  back pain[ y ];  Heme/Lymph: bruising[  ];  bleeding[n  ];  anemia[  ];  Neuro: TIA[  ];  headaches[n  ];  stroke[  ];  vertigo[  ];  seizures[n  ];   paresthesias[  ];  difficulty walking[ n ];  Psych:depression[  ]; anxiety[  ];  Endocrine: diabetes[y  ];  thyroid dysfunction[n  ];  Immunizations: Flu [?  ]; Pneumococcal[ ? ];  Other:  Physical Exam: BP 163/89  Pulse 51  Temp 98.3 F (36.8 C) (Oral)  Resp 18  SpO2 95%  General appearance: alert, cooperative, appears older than stated age and mild  distress Neurologic: intact Heart: regular rate and rhythm, S1, S2 normal, no murmur, click, rub or gallop and normal apical impulse Lungs: clear to auscultation bilaterally and normal percussion bilaterally Abdomen: soft, non-tender; bowel sounds normal; no masses,  no organomegaly Extremities: extremities normal, atraumatic, no cyanosis or edema and Homans sign is negative, no sign of DVT Patient does not have active wheezing, carotid without bruits No palpable cervical or adenopathy  Diagnostic Studies & Laboratory data:     Recent Radiology Findings:  Dg Chest 2 View Within Previous 72 Hours.  Films Obtained On Friday Are Acceptable For Monday And Tuesday Cases  04/13/2012  *RADIOLOGY REPORT*  Clinical Data: Preop radiograph  CHEST - 2 VIEW  Comparison: 04/04/2012  Findings: The heart size and mediastinal contours are within normal limits. Small  hiatal hernia.  Both lungs are clear.  The visualized skeletal structures are unremarkable.  IMPRESSION:  1.  No acute cardiopulmonary abnormalities. 2.  Hiatal hernia.  Original Report Authenticated By: Rosealee Albee, M.D.   Clinical Data: Weight loss, shortness of breath, former smoking  history.  CT CHEST WITH CONTRAST  Technique: Multidetector CT imaging of the chest was performed  following the standard protocol during bolus administration of  intravenous contrast.  Contrast: OMNIPAQUE IOHEXOL 300 MG/ML SOLN  Comparison: Chest x-ray of 02/07/2012 and CT chest of 11/26/2010  Findings: On the lung window images, there is mild centrilobular  emphysema present. Also, there is streaky opacity emanating from  the left suprahilar region into the left upper lobe. This is not  seen previously. No definite mass is noted associated with this  linear opacity and this could represent scarring. However follow-  up CT of the chest in 4 months is recommended to assess stability.  No other infiltrate or effusion is seen.  On soft tissue window images, the thyroid gland is unremarkable.  The thoracic aorta opacifies with moderate atheromatous change  noted within the aortic arch and descending thoracic aorta.  Coronary artery calcifications are present in the distribution of  the left anterior descending coronary artery. There is mild  cardiomegaly noted. No mediastinal or hilar adenopathy is seen. A  single precarinal node to the left midline measures 9 mm in short  axis diameter. A moderate sized hiatal hernia is again noted. No  abnormality of the upper abdomen is seen. No bony abnormality is  noted.  IMPRESSION:  1. Irregular linear opacity within the left upper lobe. Possible  scarring but recommend follow-up CT of the chest in 4 months to  assess stability. No mediastinal or hilar adenopathy is seen.  2. Coronary artery calcifications and mild cardiomegaly.  3. Moderate sized hiatal  hernia.  4. Centrilobular emphysema.  Original Report Authenticated By: Juline Patch, M.D.  NUCLEAR MEDICINE PET SKULL BASE TO THIGH  Fasting Blood Glucose: 99  Technique: 19.9 mCi F-18 FDG was injected intravenously. CT data  was obtained and used for attenuation correction and anatomic  localization only. (This was not acquired as a diagnostic CT  examination.) Additional exam technical data entered on  technologist worksheet.  Comparison: 02/07/2012  Findings:  Neck: No hypermetabolic lymph nodes in the neck.  Chest: There is no supraclavicular or axillary adenopathy.  Multiple mediastinal lymph nodes are identified which exhibit  abnormal increased uptake. There is a low right paratracheal lymph  node which measures less than 1 cm but has an SUV max equal to 3.8,  image 97. Within the AP window region there is a  1 cm lymph node  which has an SUV max equal to 4.6, image 99 air bilateral hilar  lymph nodes are identified which are hypermetabolic. Lymph node  within the left hilar region measures approximately 2 x 2.9 cm and  has an SUV max equal to 7.5  The right hilar lymph node has an SUV max equal to 4.6, image  number 105. Again seen is a spiculated scar like density within  the superior segment of the right lower lobe. This appears to  emanate from the left infrahilar region and has an SUV max equal to  3.1.  The patient has a large hiatal hernia.  Abdomen/Pelvis: No abnormal hypermetabolic activity within the  liver, pancreas, adrenal glands, or spleen. No hypermetabolic  lymph nodes in the abdomen or pelvis.  [place]: No focal hypermetabolic activity to suggest skeletal  metastasis.  IMPRESSION:  1. Examination is positive for malignant range FDG uptake within  the bilateral hilar and mediastinal lymph nodes.  2. Spiculated patch that irregular spiculated opacity within the  superior segment of the left lower lobe exhibits malignant range  FDG uptake. Cannot rule out  tumor.  3. Advised further evaluation with bronchoscopy and biopsy of left  hilar lymph nodes.  Original Report Authenticated By: Rosealee Albee, M.D.      Recent Lab Findings: Lab Results  Component Value Date   WBC 7.2 06/08/2012   HGB 13.4 06/08/2012   HCT 39.9 06/08/2012   PLT 164 06/08/2012   GLUCOSE 90 06/08/2012   CHOL 194 08/04/2011   TRIG 262* 08/04/2011   HDL 26* 08/04/2011   LDLCALC 116* 08/04/2011   ALT 9 06/08/2012   AST 15 06/08/2012   NA 139 06/08/2012   K 4.2 06/08/2012   CL 103 06/08/2012   CREATININE 0.93 06/08/2012   BUN 14 06/08/2012   CO2 28 06/08/2012   TSH 1.423 08/03/2011   INR 0.99 06/08/2012   HGBA1C 6.1* 08/04/2011      Assessment / Plan:     Patient found incidentally to have bilateral hilar adenopathy, PET avid with a question of left lower lobe lung mass. So far ebus did not reveal any specific tissue diagnosis. The patient comesin today for repeat ebus and possible mediastinoscopy to to obtain a tissue diagnosis attempt, in a patient who is high risk for carcinoma the lung. Dr Inis Sizer will attempt repeat ebus and if no satisfactory tissue will proceed with mediastinoscopy.  The goals risks and alternatives of the planned surgical procedure bronch, EBUS mediastinoscopy have been discussed with the patient in detail. The risks of the procedure including death, infection, stroke, myocardial infarction, bleeding, blood transfusion have all been discussed specifically.  I have quoted [name] a 1 % of perioperative mortality and a complication rate as high as 10%. The patient's questions have been answered.DORRIEN GRUNDER is willing  to proceed with the planned procedure.      Delight Ovens MD  Beeper [phone] Office [phone] 06/11/2012 7:17 AM

## 2012-06-11 NOTE — Preoperative (Signed)
Beta Blockers   Reason not to administer Beta Blockers:Not Applicable 

## 2012-06-11 NOTE — Transfer of Care (Signed)
Immediate Anesthesia Transfer of Care Note  Patient: Tyler [name]  Procedure(s) Performed: Procedure(s) (LRB): VIDEO BRONCHOSCOPY WITH ENDOBRONCHIAL ULTRASOUND (N/A) MEDIASTINOSCOPY (N/A)  Patient Location: PACU  Anesthesia Type: General  Level of Consciousness: awake, alert  and oriented  Airway & Oxygen Therapy: Patient Spontanous Breathing and Patient connected to face mask oxygen  Post-op Assessment: Report given to PACU RN, Post -op Vital signs reviewed and stable and Patient moving all extremities X 4  Post vital signs: Reviewed and stable  Complications: No apparent anesthesia complications

## 2012-06-11 NOTE — Op Note (Signed)
Video Bronchoscopy with Endobronchial Ultrasound Procedure Note  Date of Operation: 06/11/2012  Pre-op Diagnosis: L hilar mass, LAD  Post-op Diagnosis: Same  Surgeon: [name]  Assistants: Dr Wynetta Fines  Anesthesia: General endotracheal anesthesia  Operation: Flexible video fiberoptic bronchoscopy with endobronchial ultrasound and biopsies.  Estimated Blood Loss: 10cc  Complications: None apparent  Indications and History: Tyler [name] is a 71 y.o. male with hx smoking. He was noted to have L hiar mass and superior mediastinal LAD on PET scan 03/2012. He underwent non-diagnostic FOB and EBUS in June, but given suspicion for possible malignancy it was recommended that he return for repeat EBUS and for possible mediastinoscopy by Dr Tyrone Sage.  The risks, benefits, complications, treatment options and expected outcomes were discussed with the patient.  The possibilities of pneumothorax, pneumonia, reaction to medication, pulmonary aspiration, perforation of a viscus, bleeding, failure to diagnose a condition and creating a complication requiring transfusion or operation were discussed with the patient who freely signed the consent.    Description of Procedure: The patient was examined in the preoperative area and history and data from the preprocedure consultation were reviewed. It was deemed appropriate to proceed.  The patient was taken to OR10, identified as Tyler [name] and the procedure verified as Flexible Video Fiberoptic Bronchoscopy.  A Time Out was held and the above information confirmed. General anesthesia was initiated and the patient  was orally intubated. The video fiberoptic bronchoscope was introduced via the endotracheal tube and a general inspection was performed which showed normal airways. The standard scope was then withdrawn and the endobronchial ultrasound was used to identify and characterize the peritracheal, hilar and bronchial lymph nodes. Inspection  showed the L hilar mass at the carina separating the LUL and LLL. Also seen were an enlarged 4L node in the AP window and a small 4R node. Using real-time ultrasound guidance Wang needle biopsies were taken from the L hilar mass and from Station 4L node and were sent for cytology. The 4R node was localized by no successful biopsies could be obtained due to small size and difficult location. The patient tolerated the procedure well without apparent complications. There was no significant blood loss. The bronchoscope was withdrawn. Preliminary data on needle biopsies from the L hilar mass was again negative. At the time of this note, cytology on the 4L node was pending. Depending on results, the patient may proceed on to mediastinoscopy.   Samples: 1. Wang needle biopsies from 4L node 2. Wang needle biopsies from L hilar mass.  Plans:  The patient will be discharged from the PACU to home when recovered from anesthesia. We will review the final cytology results with the patient when they become available. Outpatient followup will be with Drs Bridgette Habermann and with his PCP Dr Tyler [name].    [name], MD, PhD 06/11/2012, 9:25 AM [facility] [phone] or if no answer [phone]

## 2012-06-11 NOTE — Brief Op Note (Signed)
06/11/2012  10:54 AM  PATIENT:  Tyler [name]  71 y.o. male  PRE-OPERATIVE DIAGNOSIS:  hilar adenopathy  POST-OPERATIVE DIAGNOSIS:  hilar adenopathy  PROCEDURE:  Procedure(s) (LRB): VIDEO BRONCHOSCOPY WITH ENDOBRONCHIAL ULTRASOUND (N/A) MEDIASTINOSCOPY (N/A)  SURGEON:  Surgeon(s) and Role: Panel 1:    * Delight Ovens, MD - Primary  Panel 2:    * Leslye Peer, MD - Primary  PHYSICIAN ASSISTANT: none    ANESTHESIA:   general  EBL:  Total I/O In: 1000 [I.V.:1000] Out: 20 [Blood:20]  BLOOD ADMINISTERED:none  DRAINS: none     SPECIMEN:  Source of Specimen:  4r node from mediastinoscopy, 4l and hilar mass by EBUS Dr Delton Coombes  DISPOSITION OF SPECIMEN:  PATHOLOGY  COUNTS:  YES   DICTATION: .Other Dictation: Dictation Number   PLAN OF CARE: Discharge to home after PACU  PATIENT DISPOSITION:  PACU - hemodynamically stable.   Delay start of Pharmacological VTE agent (>24hrs) due to surgical blood loss or risk of bleeding: yes

## 2012-06-11 NOTE — OR Nursing (Signed)
Procedure 1 EBUS start at 0744 and end at 0907. Procedure 2 Mediastinoscopy start at 0945 and end 1052.

## 2012-06-11 NOTE — Anesthesia Preprocedure Evaluation (Addendum)
Anesthesia Evaluation  Patient identified by MRN, date of birth, ID band Patient awake    Reviewed: Allergy & Precautions, H&P , NPO status , Patient's Chart, lab work & pertinent test results  Airway Mallampati: I TM Distance: >3 FB Neck ROM: full    Dental   Pulmonary shortness of breath, COPD         Cardiovascular hypertension, Rhythm:regular Rate:Normal     Neuro/Psych    GI/Hepatic GERD-  ,  Endo/Other    Renal/GU      Musculoskeletal   Abdominal   Peds  Hematology   Anesthesia Other Findings   Reproductive/Obstetrics                           Anesthesia Physical Anesthesia Plan  ASA: II  Anesthesia Plan: General   Post-op Pain Management:    Induction: Intravenous  Airway Management Planned: Oral ETT  Additional Equipment:   Intra-op Plan:   Post-operative Plan: Extubation in OR  Informed Consent: I have reviewed the patients History and Physical, chart, labs and discussed the procedure including the risks, benefits and alternatives for the proposed anesthesia with the patient or authorized representative who has indicated his/her understanding and acceptance.     Plan Discussed with: CRNA, Anesthesiologist and Surgeon  Anesthesia Plan Comments:         Anesthesia Quick Evaluation

## 2012-06-12 ENCOUNTER — Encounter (HOSPITAL_COMMUNITY): Payer: Self-pay | Admitting: Cardiothoracic Surgery

## 2012-06-12 SURGERY — ENDOBRONCHIAL ULTRASOUND (EBUS)
Anesthesia: General

## 2012-06-12 NOTE — Op Note (Signed)
[name], Tyler [name]             ACCOUNT NO.:  [id number]  MEDICAL RECORD NO.:  [id number]  LOCATION:  [facility]                         FACILITY:  [facility]  PHYSICIAN:  Sheliah Plane, MD    DATE OF BIRTH:  [birth year]  DATE OF PROCEDURE:  06/11/2012 DATE OF DISCHARGE:  06/11/2012                              OPERATIVE REPORT   PREOPERATIVE DIAGNOSIS:  Mediastinal adenopathy with evidence of hypermetabolic activity on PET scan, question of carcinoma of the lung.  POSTOPERATIVE DIAGNOSIS:  Mediastinal adenopathy with evidence of hypermetabolic activity on PET scan, question of carcinoma of the lung.  SURGICAL PROCEDURE:  Mediastinoscopy.  BRIEF HISTORY:  The patient is a 71 year old male who a month prior had undergone EBUS and attempt to obtain a tissue diagnosis on hypermetabolic 4L and 4R lymph nodes and question of left hilar mass, no definitive diagnosis was made.  The patient was referred by Dr. Delton Coombes for consideration of mediastinoscopy.  In collaboration with Dr. Delton Coombes, the case today was performed first reattempting EBUS to obtain more tissue.  This is dictated under separate note by Dr. Delton Coombes, but with multiple good passes into nodal tissue.  No definitive diagnosis could be made, so as we had discussed with the patient preoperatively, proceeded with mediastinoscopy.  DESCRIPTION OF PROCEDURE:  Appropriate time-out was performed.  The patient's neck and chest was prepped with Betadine and draped in sterile manner.  Transverse incision was made in the suprasternal notch and dissection carried out to the pretracheal fascia.  The pretracheal fascia was bluntly dissected away from the trachea into the mediastinum. A video-mediastinoscope was introduced into the superior mediastinum and a moderate-sized 4R lymph node was easily identified and corresponded to the hypermetabolic lymph node on the previous PET scan in the 4R position.  Multiple biopsies of this nodule were obtained,  there was adequate tissue.  Initial frozen sections showed nodal tissue, but without either granulomatous disease or malignancy.  Additional portions of the 4R lymph nodes were removed and submitted for permanent section. With the operative field hemostatic, the scope was removed, the deep layers were closed with interrupted 3-0 Vicryl and a running 4-0 subcuticular stitch in skin edges.  Dermabond dressing was applied.  The patient was awakened and extubated in the operating room having tolerated the procedure without obvious complication and was transferred to the recovery room for further postoperative observation.  Sponge and needle count was reported as correct.     Sheliah Plane, MD     EG/MEDQ  D:  06/11/2012  T:  06/12/2012  Job:  [id number]

## 2012-06-14 ENCOUNTER — Ambulatory Visit (INDEPENDENT_AMBULATORY_CARE_PROVIDER_SITE_OTHER): Payer: Medicare Other | Admitting: Cardiothoracic Surgery

## 2012-06-14 ENCOUNTER — Encounter: Payer: Self-pay | Admitting: Cardiothoracic Surgery

## 2012-06-14 VITALS — BP 133/68 | HR 58 | Resp 20 | Ht 74.0 in | Wt 229.0 lb

## 2012-06-14 DIAGNOSIS — R599 Enlarged lymph nodes, unspecified: Secondary | ICD-10-CM

## 2012-06-14 DIAGNOSIS — Z09 Encounter for follow-up examination after completed treatment for conditions other than malignant neoplasm: Secondary | ICD-10-CM

## 2012-06-14 DIAGNOSIS — R590 Localized enlarged lymph nodes: Secondary | ICD-10-CM

## 2012-06-14 NOTE — Progress Notes (Signed)
[address]            [place] [address]          [phone]      XANE AMSDEN [facility] Medical Record #[id number] Date of Birth: 05/22/1941  Referring: Dr Delton Coombes Primary Care: Aida Puffer, MD  Chief Complaint:    Chief Complaint  Patient presents with  . Routine Post Op    1 week f/u s/p mediastinoscopy on 06/11/12    History of Present Illness:    71 yo former heavy smoker who had a CT scan of the chest done as part of evaluation for unexplained weight loss. A PET scan was also performed. Dr. Delton Coombes perform bronchoscopy and EBUS, but without any pathologic findings. The patient is referred for consideration of repeat ebus and or  mediastinoscopy. Repeat EBUS and mediastinoscopy was done early this week and patient return for post op visit and review findings.  Current Activity/ Functional Status: Patient is independent with mobility/ambulation, transfers, ADL's, IADL's.   Past Medical History  Diagnosis Date  . Allergic rhinitis   . BPH (benign prostatic hyperplasia)   . GERD (gastroesophageal reflux disease)   . COPD (chronic obstructive pulmonary disease)   . Shortness of breath   . Arthritis   . Enlarged prostate   . Hypertension     dr Shon Hale [place] in [place]   pcp      dr Jens Som  cardiac md    Past Surgical History  Procedure Date  . Ankle surgery 2005  . Prostate surgery 11/25/2010  . Hernia repair   . Mediastinoscopy 06/11/2012    Procedure: MEDIASTINOSCOPY;  Surgeon: Delight Ovens, MD;  Location: [facility] OR;  Service: Thoracic;  Laterality: N/A;    Family History  Problem Relation Age of Onset  . Heart disease Mother   . Arthritis Sister   . Arthritis Sister   . Liver cancer Father       History  Smoking status  . Former Smoker -- 2.0 packs/day for 50 years  . Types: Cigarettes  . Quit date: 12/01/2010  Smokeless tobacco  . Not on file    History  Alcohol Use  . Yes    5 beers every night      Allergies  Allergen Reactions  . Codeine Itching  . Hydrocod Polst-Cpm Polst Er   . Ketorolac Tromethamine   . Tramadol     Current Outpatient Prescriptions  Medication Sig Dispense Refill  . atenolol (TENORMIN) 100 MG tablet Take 100 mg by mouth 2 (two) times daily.      Marland Kitchen dexlansoprazole (DEXILANT) 60 MG capsule Take 60 mg by mouth daily.       . hydrOXYzine (VISTARIL) 25 MG capsule Take 25 mg by mouth 4 (four) times daily.      Marland Kitchen omeprazole (PRILOSEC) 20 MG capsule Take 20 mg by mouth 2 (two) times daily.      Marland Kitchen oxyCODONE-acetaminophen (PERCOCET) 10-325 MG per tablet Take 1 tablet by mouth every 4 (four) hours as needed.       . Tamsulosin HCl (FLOMAX) 0.4 MG CAPS Take 0.4 mg by mouth 2 (two) times daily.           Review of Systems:     Cardiac Review of Systems: Y or N  Chest Pain [ y   ]  Resting SOB [n   ] Exertional  SOB  Cove.Etienne  ]  Orthopnea [ n ]   Pedal Edema [ n  ]    Palpitations [ n ] Syncope  [ n ]   Presyncope [ n  ]  General Review of Systems: [Y] = yes [  ]=no Constitional: recent weight change [loss  ]; anorexia [  ]; fatigue Cove.Etienne  ]; nausea [ y ]; night sweats Cove.Etienne  ]; fever [  ]; or chills [  ];                                                                                                                                          Dental: poor dentition[ n ];   Eye : blurred vision [  ]; diplopia [   ]; vision changes [  ];  Amaurosis fugax[  ]; Resp: cough [  ];  wheezing[  ];  hemoptysis[  ]; shortness of breath[  ]; paroxysmal nocturnal dyspnea[  ]; dyspnea on exertion[  ]; or orthopnea[  ];  GI:  gallstones[  ], vomiting[  ];  dysphagia[  ]; melena[  ];  hematochezia [  ]; heartburn[ y ];   Hx of  Colonoscopy[n  ]; GU: kidney stones [  ]; hematuria[  ];   dysuria [  ];  nocturia[  ];  history of     obstruction [  ];             Skin: rash, swelling[  ];, hair loss[  ];  peripheral edema[  ];  or itching[  ]; Musculosketetal: myalgias[  ];  joint swelling[  ];   joint erythema[  ];  joint pain[y  ];  back pain[ y ];  Heme/Lymph: bruising[  ];  bleeding[n  ];  anemia[  ];  Neuro: TIA[  ];  headaches[n  ];  stroke[  ];  vertigo[  ];  seizures[n  ];   paresthesias[  ];  difficulty walking[ n ];  Psych:depression[  ]; anxiety[  ];  Endocrine: diabetes[y  ];  thyroid dysfunction[n  ];  Immunizations: Flu [?  ]; Pneumococcal[ ? ];  Other:  Physical Exam: BP 133/68  Pulse 58  Resp 20  Ht 6\' 2"  (1.88 m)  Wt 229 lb (103.874 kg)  BMI 29.40 kg/m2  SpO2 92%  General appearance: alert, cooperative, appears older than stated age and mild distress Neurologic: intact Heart: regular rate and rhythm, S1, S2 normal, no murmur, click, rub or gallop and normal apical impulse Lungs: clear to auscultation bilaterally and normal percussion bilaterally Abdomen: soft, non-tender; bowel sounds normal; no masses,  no organomegaly Extremities: extremities normal, atraumatic, no cyanosis or edema and Homans sign is negative, no sign of DVT Patient does not have active wheezing, carotid without bruits   Diagnostic Studies & Laboratory data:     Recent Radiology Findings:   Dg Chest 2 View Within Previous  72 Hours.  Films Obtained On Friday Are Acceptable For Monday And Tuesday Cases  06/11/2012  *RADIOLOGY REPORT*  Clinical Data: Preoperative radiograph.  Lung surgery today.  CHEST - 2 VIEW  Comparison: 04/13/2012.  Findings: There is a prominent left-sided pericardial fat pad.  No airspace disease.  No effusion is identified.  Moderate hiatal hernia.  Cardiopericardial silhouette appears similar prior exam. Pulmonary hila appears similar to prior.  Nodular density at the right lung base most compatible with nipple shadow.  IMPRESSION: Chronic changes of the chest without acute cardiopulmonary disease. No interval change compared to prior 04/13/2012.  Original Report Authenticated By: Andreas Newport, M.D.   Dg Chest Port 1 View  06/11/2012  *RADIOLOGY REPORT*  Clinical  Data: Mediastinoscopy  PORTABLE CHEST - 1 VIEW  Comparison: 6:30 hours  Findings: Mild cardiomegaly. Stable left basilar opacity likely due to a combination of volume loss and mediastinal fat.  No pneumothorax.  No evidence of mediastinal emphysema.  Normal vascularity.  Upper lungs are grossly clear.  IMPRESSION: No evidence of pneumothorax.  Cardiomegaly without edema.  Original Report Authenticated By: Donavan Burnet, M.D.  Dg Chest 2 View Within Previous 72 Hours.  Films Obtained On Friday Are Acceptable For Monday And Tuesday Cases  04/13/2012  *RADIOLOGY REPORT*  Clinical Data: Preop radiograph  CHEST - 2 VIEW  Comparison: 04/04/2012  Findings: The heart size and mediastinal contours are within normal limits. Small hiatal hernia.  Both lungs are clear.  The visualized skeletal structures are unremarkable.  IMPRESSION:  1.  No acute cardiopulmonary abnormalities. 2.  Hiatal hernia.  Original Report Authenticated By: Rosealee Albee, M.D.   Clinical Data: Weight loss, shortness of breath, former smoking  history.  CT CHEST WITH CONTRAST  Technique: Multidetector CT imaging of the chest was performed  following the standard protocol during bolus administration of  intravenous contrast.  Contrast: OMNIPAQUE IOHEXOL 300 MG/ML SOLN  Comparison: Chest x-ray of 02/07/2012 and CT chest of 11/26/2010  Findings: On the lung window images, there is mild centrilobular  emphysema present. Also, there is streaky opacity emanating from  the left suprahilar region into the left upper lobe. This is not  seen previously. No definite mass is noted associated with this  linear opacity and this could represent scarring. However follow-  up CT of the chest in 4 months is recommended to assess stability.  No other infiltrate or effusion is seen.  On soft tissue window images, the thyroid gland is unremarkable.  The thoracic aorta opacifies with moderate atheromatous change  noted within the aortic arch and  descending thoracic aorta.  Coronary artery calcifications are present in the distribution of  the left anterior descending coronary artery. There is mild  cardiomegaly noted. No mediastinal or hilar adenopathy is seen. A  single precarinal node to the left midline measures 9 mm in short  axis diameter. A moderate sized hiatal hernia is again noted. No  abnormality of the upper abdomen is seen. No bony abnormality is  noted.  IMPRESSION:  1. Irregular linear opacity within the left upper lobe. Possible  scarring but recommend follow-up CT of the chest in 4 months to  assess stability. No mediastinal or hilar adenopathy is seen.  2. Coronary artery calcifications and mild cardiomegaly.  3. Moderate sized hiatal hernia.  4. Centrilobular emphysema.  Original Report Authenticated By: Juline Patch, M.D.  NUCLEAR MEDICINE PET SKULL BASE TO THIGH  Fasting Blood Glucose: 99  Technique: 19.9 mCi F-18  FDG was injected intravenously. CT data  was obtained and used for attenuation correction and anatomic  localization only. (This was not acquired as a diagnostic CT  examination.) Additional exam technical data entered on  technologist worksheet.  Comparison: 02/07/2012  Findings:  Neck: No hypermetabolic lymph nodes in the neck.  Chest: There is no supraclavicular or axillary adenopathy.  Multiple mediastinal lymph nodes are identified which exhibit  abnormal increased uptake. There is a low right paratracheal lymph  node which measures less than 1 cm but has an SUV max equal to 3.8,  image 97. Within the AP window region there is a 1 cm lymph node  which has an SUV max equal to 4.6, image 99 air bilateral hilar  lymph nodes are identified which are hypermetabolic. Lymph node  within the left hilar region measures approximately 2 x 2.9 cm and  has an SUV max equal to 7.5  The right hilar lymph node has an SUV max equal to 4.6, image  number 105. Again seen is a spiculated scar like density  within  the superior segment of the right lower lobe. This appears to  emanate from the left infrahilar region and has an SUV max equal to  3.1.  The patient has a large hiatal hernia.  Abdomen/Pelvis: No abnormal hypermetabolic activity within the  liver, pancreas, adrenal glands, or spleen. No hypermetabolic  lymph nodes in the abdomen or pelvis.  [place]: No focal hypermetabolic activity to suggest skeletal  metastasis.  IMPRESSION:  1. Examination is positive for malignant range FDG uptake within  the bilateral hilar and mediastinal lymph nodes.  2. Spiculated patch that irregular spiculated opacity within the  superior segment of the left lower lobe exhibits malignant range  FDG uptake. Cannot rule out tumor.  3. Advised further evaluation with bronchoscopy and biopsy of left  hilar lymph nodes.  Original Report Authenticated By: Rosealee Albee, M.D.      Recent Lab Findings: Lab Results  Component Value Date   WBC 7.2 06/08/2012   HGB 13.4 06/08/2012   HCT 39.9 06/08/2012   PLT 164 06/08/2012   GLUCOSE 90 06/08/2012   CHOL 194 08/04/2011   TRIG 262* 08/04/2011   HDL 26* 08/04/2011   LDLCALC 116* 08/04/2011   ALT 9 06/08/2012   AST 15 06/08/2012   NA 139 06/08/2012   K 4.2 06/08/2012   CL 103 06/08/2012   CREATININE 0.93 06/08/2012   BUN 14 06/08/2012   CO2 28 06/08/2012   TSH 1.423 08/03/2011   INR 0.99 06/08/2012   HGBA1C 6.1* 08/04/2011      Assessment / Plan:     Patient found incidentally to have bilateral hilar adenopathy, PET avid with a question of left lower lobe lung mass. So far ebus did not reveal any specific tissue diagnosis. Repeat EBUS and mediastinoscopy still show no evidence of malignancy. I have discussed the case with Dr Delton Coombes, he will see back in 3 months with repeat ct of chest.   Delight Ovens MD  Beeper [phone] Office [phone] 06/14/2012 1:47 PM

## 2012-06-29 ENCOUNTER — Ambulatory Visit (INDEPENDENT_AMBULATORY_CARE_PROVIDER_SITE_OTHER): Payer: Medicare Other | Admitting: Emergency Medicine

## 2012-06-29 ENCOUNTER — Encounter: Payer: Self-pay | Admitting: Emergency Medicine

## 2012-06-29 VITALS — BP 124/82 | HR 51 | Temp 97.2°F | Ht 74.0 in | Wt 227.2 lb

## 2012-06-29 DIAGNOSIS — J449 Chronic obstructive pulmonary disease, unspecified: Secondary | ICD-10-CM

## 2012-06-29 DIAGNOSIS — R918 Other nonspecific abnormal finding of lung field: Secondary | ICD-10-CM

## 2012-06-29 DIAGNOSIS — R222 Localized swelling, mass and lump, trunk: Secondary | ICD-10-CM

## 2012-06-29 NOTE — Assessment & Plan Note (Addendum)
He is improved on Advair. He wants to defer PFT at this time, does not want to add Spiriva right now. This is the med that I would add to Advair if his breathing worsens. He can f/u w me as needed

## 2012-06-29 NOTE — Assessment & Plan Note (Signed)
Biopsies negative.  - needs repeat CT scan of the chest in 1 year. I will defer this to Dr Clarene Duke to arrange

## 2012-06-29 NOTE — Progress Notes (Signed)
  Subjective:    Patient ID: Tyler [name], male    DOB: Aug 21, 1941, 71 y.o.   MRN: [id number]  HPI 71 yo former heavy smoker, hx HTN, DM, allergies. Has been dx with COPD about 2 weeks ago, started on Advair but only uses it about once a week. Has been rx for PNA in the past, including as a child. Was referred for abnormal CXR and CT scan chest by Dr Clarene Duke. He was well until 2 months ago when he developed gastroenteritis w Nausea, diarrhea. Finally got better but he has had poor appetite, has lost about 20 lbs over 2 months. May be a bit better now. Part of the eval for his wt loss, especially w smoking hx, was CXR and then CT scan. His eval revealed erythrocytosis, some renal insufficiency. The CT scan shows a L hilar area that looks like scar vs bronchiectasis.   ROV 06/29/12 -- follow up visit following his FOB/EBUS and then subsequent mediastinoscopy. The pathology was negative for malignancy. He is still having nausea, bitter taste in his mouth despite omeprazole tid. He is on Advair, restarted it a month ago. Seems to be helping his breathing.       Objective:   Physical Exam Filed Vitals:   06/29/12 1634  BP: 124/82  Pulse: 51  Temp: 97.2 F (36.2 C)   Gen: Pleasant, well-nourished, in no distress,  normal affect  ENT: No lesions,  mouth clear,  oropharynx clear, no postnasal drip  Neck: No JVD, no TMG, no carotid bruits, mediastinoscopy incision looks good  Lungs: No use of accessory muscles, no dullness to percussion, clear without rales or rhonchi  Cardiovascular: RRR, heart sounds normal, no murmur or gallops, no peripheral edema  Musculoskeletal: No deformities, no cyanosis or clubbing  Neuro: alert, non focal  Skin: Warm, no lesions or rashes    Assessment & Plan:  COPD (chronic obstructive pulmonary disease) He is improved on Advair. He wants to defer PFT at this time, does not want to add Spiriva right now. This is the med that I would add to Advair if his  breathing worsens. He can f/u w me as needed  Lung mass Biopsies negative.  - needs repeat CT scan of the chest in 1 year. I will defer this to Dr Clarene Duke to arrange

## 2012-06-29 NOTE — Patient Instructions (Addendum)
Your lung biopsies did not show cancer.  You will need to have a repeat scan of your chest in 1 year. Arrange for this with Dr Clarene Duke.  Continue your Advair twice a day Follow with Dr Delton Coombes as needed

## 2012-08-15 NOTE — Addendum Note (Signed)
Addended by: Reine Just on: 08/15/2012 03:29 PM   Modules accepted: Orders

## 2012-08-29 ENCOUNTER — Ambulatory Visit
Admission: RE | Admit: 2012-08-29 | Discharge: 2012-08-29 | Disposition: A | Discharge status: Home / Self Care | Payer: Medicare Other | Source: Ambulatory Visit | Attending: Family Medicine | Admitting: Family Medicine

## 2012-08-29 ENCOUNTER — Other Ambulatory Visit: Payer: Self-pay | Admitting: Family Medicine

## 2012-08-29 MED ORDER — IOHEXOL 300 MG/ML  SOLN
50.0000 mL | Freq: Once | INTRAMUSCULAR | Status: AC | PRN
  Administered 2012-08-29: 50 mL via INTRAVENOUS

## 2012-08-30 ENCOUNTER — Ambulatory Visit (INDEPENDENT_AMBULATORY_CARE_PROVIDER_SITE_OTHER): Payer: Medicare Other | Admitting: Internal Medicine

## 2012-08-30 ENCOUNTER — Encounter: Payer: Self-pay | Admitting: Internal Medicine

## 2012-08-30 VITALS — BP 124/60 | HR 60 | Temp 97.9°F | Ht 74.0 in | Wt 228.0 lb

## 2012-08-30 DIAGNOSIS — J449 Chronic obstructive pulmonary disease, unspecified: Secondary | ICD-10-CM

## 2012-08-30 DIAGNOSIS — I10 Essential (primary) hypertension: Secondary | ICD-10-CM

## 2012-08-30 DIAGNOSIS — R918 Other nonspecific abnormal finding of lung field: Secondary | ICD-10-CM

## 2012-08-30 DIAGNOSIS — R222 Localized swelling, mass and lump, trunk: Secondary | ICD-10-CM

## 2012-08-30 MED ORDER — NEBIVOLOL HCL 10 MG PO TABS: 10.0000 mg | ORAL_TABLET | Freq: Two times a day (BID) | ORAL | Status: DC

## 2012-08-30 MED ORDER — BUDESONIDE-FORMOTEROL FUMARATE 160-4.5 MCG/ACT IN AERO: INHALATION_SPRAY | RESPIRATORY_TRACT | Status: DC

## 2012-08-30 MED ORDER — NEBIVOLOL HCL 10 MG PO TABS: 10.0000 mg | ORAL_TABLET | Freq: Every day | ORAL | Status: DC

## 2012-08-30 NOTE — Assessment & Plan Note (Signed)
Symptoms are markedly disproportionate to objective findings and not clear this is a lung problem but pt does appear to have difficult airway management issues.   DDX of  difficult airways managment all start with A and  include Adherence, Ace Inhibitors, Acid Reflux, Active Sinus Disease, Alpha 1 Antitripsin deficiency, Anxiety masquerading as Airways dz,  ABPA,  allergy(esp in young), Aspiration (esp in elderly), Adverse effects of DPI,  Active smokers, plus two Bs  = Bronchiectasis and Beta blocker use..and one C= CHF   Adherence is always the initial "prime suspect" and is a multilayered concern that requires a "trust but verify" approach in every patient - starting with knowing how to use medications, especially inhalers, correctly, keeping up with refills and understanding the fundamental difference between maintenance and prns vs those medications only taken for a very short course and then stopped and not refilled.  The proper method of use, as well as anticipated side effects, of a metered-dose inhaler are discussed and demonstrated to the patient. Improved effectiveness after extensive coaching during this visit to a level of approximately 75% so try change advair to symbicort 160 2bid  ? Acid reflux > rx GERD  ? Beta blocker effect > see HBP sep discussion

## 2012-08-30 NOTE — Assessment & Plan Note (Signed)
Strongly prefer in this setting: Bystolic, the most beta -1  selective Beta blocker available in sample form, with bisoprolol the most selective generic choice  on the market.   Try bystolic 10 mg bid for now

## 2012-08-30 NOTE — Progress Notes (Signed)
  Subjective:    Patient ID: Tyler [name], male    DOB: 19-Nov-1940   MRN: [id number]  HPI 12 yowm  former heavy smoker, hx HTN, DM, allergies.    08/30/2012 acute w/in ov / [facility]  cc new sob since 10/27 ok at rest, assoc with new ant pleuritic and positional  L CP  > ct chest 08/29/12 1. Increase in size of the spiculated lesion noted previously in  the left suprahilar region with narrowing of the bronchus to the  superior segment of the left lower lobe and a new nodular opacity  within the left lower lobe. These findings are highly suspicious  for primary lung carcinoma with metastatic involvement of the left  lower lobe.  2. No increase in mediastinal or hilar adenopathy is seen.  3. Centrilobular emphysema.  4. Moderate sized hiatal hernia.  No obvious daytime variabilty with his sob or cp or assoc chronic cough or cp or chest tightness, subjective wheeze overt sinus or hb symptoms. No unusual exp hx or h/o childhood pna/ asthma or premature birth to his knowledge.    Sleeping ok without nocturnal  or early am exacerbation  of respiratory  c/o's or need for noct saba. Also denies any obvious fluctuation of symptoms with weather or environmental changes or other aggravating or alleviating factors except as outlined above   ROS  The following are not active complaints unless bolded sore throat, dysphagia, dental problems, itching, sneezing,  nasal congestion or excess/ purulent secretions, ear ache,   fever, chills, sweats, unintended wt loss, pleuritic cp or exertional cp, hemoptysis,  orthopnea pnd or leg swelling, presyncope, palpitations, heartburn, abdominal pain, anorexia, nausea, vomiting, diarrhea  or change in bowel or urinary habits, change in stools or urine, dysuria,hematuria,  rash, arthralgias, visual complaints, headache, numbness weakness or ataxia or problems with walking or coordination,  change in mood/affect or memory.         Objective:   Physical Exam  Wt  Readings from Last 3 Encounters:  08/30/12 228 lb (103.42 kg)  06/29/12 227 lb 3.2 oz (103.057 kg)  06/14/12 229 lb (103.874 kg)    Gen: Pleasant, well-nourished, in no distress,  normal affect  ENT: No lesions,  mouth clear,  oropharynx clear, no postnasal drip  Neck: No JVD, no TMG, no carotid bruits, mediastinoscopy incision looks good  Lungs: No use of accessory muscles, no dullness to percussion, clear without rales or rhonchi  Cardiovascular: RRR, heart sounds normal, no murmur or gallops, no peripheral edema  Musculoskeletal: No deformities, no cyanosis or clubbing  Neuro: alert, non focal  Skin: Warm, no lesions or rashes    Assessment & Plan:

## 2012-08-30 NOTE — Assessment & Plan Note (Signed)
Strongly doubt the nodule in the lower lobe has anything to do with his present symptoms but may eventually warrant additonal w/u > defer to Dr Delton Coombes

## 2012-08-30 NOTE — Patient Instructions (Addendum)
Stop advair and tenormin  Start symbicort 160 Take 2 puffs first thing in am and then another 2 puffs about 12 hours later.    Start bystolic 10 mg one twice daily - if too low ok to change to once daily  Please schedule a follow up office visit in 2  weeks, sooner if needed to see Byrum or Tammy

## 2012-09-11 ENCOUNTER — Telehealth: Payer: Self-pay | Admitting: Emergency Medicine

## 2012-09-12 NOTE — Telephone Encounter (Signed)
Thank you :)

## 2012-09-13 ENCOUNTER — Encounter: Payer: Self-pay | Admitting: Emergency Medicine

## 2012-09-13 ENCOUNTER — Ambulatory Visit (INDEPENDENT_AMBULATORY_CARE_PROVIDER_SITE_OTHER): Payer: Medicare Other | Admitting: Emergency Medicine

## 2012-09-13 VITALS — BP 140/78 | HR 107 | Temp 97.5°F | Ht 74.0 in | Wt 234.2 lb

## 2012-09-13 DIAGNOSIS — R222 Localized swelling, mass and lump, trunk: Secondary | ICD-10-CM

## 2012-09-13 DIAGNOSIS — R918 Other nonspecific abnormal finding of lung field: Secondary | ICD-10-CM

## 2012-09-13 DIAGNOSIS — J449 Chronic obstructive pulmonary disease, unspecified: Secondary | ICD-10-CM

## 2012-09-13 NOTE — Patient Instructions (Addendum)
We will refer you to the Multidisciplinary Thoracic Oncology Clinic to discuss your abnormal CT scan of the chest and to hear about the options for diagnosis and treatment of possible lung cancer Please continue your oxygen and Symbicort as you are using them  Follow with Dr Delton Coombes in 1 month

## 2012-09-13 NOTE — Progress Notes (Signed)
  Subjective:    Patient ID: Tyler [name], male    DOB: 12/30/1940, 71 y.o.   MRN: [id number]  HPI 71 yo former heavy smoker, hx HTN, DM, allergies. Has been dx with COPD about 2 weeks ago, started on Advair but only uses it about once a week. Has been rx for PNA in the past, including as a child. Was referred for abnormal CXR and CT scan chest by Dr Little. He was well until 2 months ago when he developed gastroenteritis w Nausea, diarrhea. Finally got better but he has had poor appetite, has lost about 20 lbs over 2 months. May be a bit better now. Part of the eval for his wt loss, especially w smoking hx, was CXR and then CT scan. His eval revealed erythrocytosis, some renal insufficiency. The CT scan shows a L hilar area that looks like scar vs bronchiectasis.   ROV 06/29/12 -- follow up visit following his FOB/EBUS and then subsequent mediastinoscopy. The pathology was negative for malignancy. He is still having nausea, bitter taste in his mouth despite omeprazole tid. He is on Advair, restarted it a month ago. Seems to be helping his breathing.   08/30/2012 acute w/in ov / [facility]ert cc new sob since 10/27 ok at rest, assoc with new ant pleuritic and positional L CP > ct chest 08/29/12 1. Increase in size of the spiculated lesion noted previously in  the left suprahilar region with narrowing of the bronchus to the  superior segment of the left lower lobe and a new nodular opacity  within the left lower lobe. These findings are highly suspicious  for primary lung carcinoma with metastatic involvement of the left  lower lobe.  2. No increase in mediastinal or hilar adenopathy is seen.  3. Centrilobular emphysema.  4. Moderate sized hiatal hernia  ROV 09/13/12 -- COPD, L hilar mass that has been negative on EBUS and mediastinoscopy. Now with more distal nodular LLL opacity, ? Post-obstructive process vs malignancy. He saw Dr Wert 10/31 and started symbicort >. Feels that it has helped some. He has  started hospice services through Dr Little, ostensibly for lung cancer.       Objective:   Physical Exam Filed Vitals:   09/13/12 1441  BP: 140/78  Pulse: 107  Temp: 97.5 F (36.4 C)   Gen: Pleasant, well-nourished, in no distress,  normal affect  ENT: No lesions,  mouth clear,  oropharynx clear, no postnasal drip  Neck: No JVD, no TMG, no carotid bruits, mediastinoscopy incision looks good  Lungs: No use of accessory muscles, no dullness to percussion, clear without rales or rhonchi  Cardiovascular: RRR, heart sounds normal, no murmur or gallops, no peripheral edema  Musculoskeletal: No deformities, no cyanosis or clubbing  Neuro: alert, non focal  Skin: Warm, no lesions or rashes    Assessment & Plan:  Lung mass The proximal lesion has been biopsied, path negative. Now case is complicated by fact that there is a more distal lesion - either post-obstruction or mass. He believes that he is to be referred to cancer center, but he also has been referred to hospice. I discussed with him today - he is willing to go to [facility] to discuss options for bx and to hear options for treatment. I will refer him to [facility] and for discussion at thoracic conference.  COPD (chronic obstructive pulmonary disease) Continue symbicort and O2 as ordered    

## 2012-09-13 NOTE — Assessment & Plan Note (Signed)
The proximal lesion has been biopsied, path negative. Now case is complicated by fact that there is a more distal lesion - either post-obstruction or mass. He believes that he is to be referred to cancer center, but he also has been referred to hospice. I discussed with him today - he is willing to go to [facility] to discuss options for bx and to hear options for treatment. I will refer him to [facility] and for discussion at thoracic conference.

## 2012-09-13 NOTE — Assessment & Plan Note (Signed)
Continue symbicort and O2 as ordered

## 2012-09-14 ENCOUNTER — Encounter: Payer: Self-pay | Admitting: *Deleted

## 2012-09-14 NOTE — Progress Notes (Signed)
Emailed Dr. Delton Coombes regarding pt referral.  He stated he would like to talk about pt at conference and then schedule for [facility].

## 2012-09-19 ENCOUNTER — Telehealth: Payer: Self-pay | Admitting: *Deleted

## 2012-09-19 NOTE — Telephone Encounter (Signed)
Spoke with pt regarding appt time and place.  He verbalized understanding of appt.

## 2012-09-20 ENCOUNTER — Telehealth: Payer: Self-pay | Admitting: Internal Medicine

## 2012-09-20 ENCOUNTER — Telehealth: Payer: Self-pay | Admitting: Emergency Medicine

## 2012-09-20 NOTE — Telephone Encounter (Signed)
C/D 09/20/12 for appt.09/25/12

## 2012-09-20 NOTE — Telephone Encounter (Signed)
Need to call Dr Clarene Duke regarding pt's CT scan, discussion in thoracic conference, repeat bx.

## 2012-09-21 ENCOUNTER — Telehealth: Payer: Self-pay | Admitting: Emergency Medicine

## 2012-09-21 ENCOUNTER — Telehealth: Payer: Self-pay | Admitting: *Deleted

## 2012-09-21 DIAGNOSIS — R222 Localized swelling, mass and lump, trunk: Secondary | ICD-10-CM

## 2012-09-21 NOTE — Telephone Encounter (Signed)
Called and left message with Dr Fredirick Maudlin office. Wanted to inform him that I discussed pt and his f/u CT scan in Thoracic Conference 11/21. His L hilar lesion appears slightly larger, now with a more peripheral opacity that looks like postobstructive collapse. He has undergone Bx x 2 via EBUS that were non-diagnostic, as well as mediastinoscopy to eval hypermetabolic mediastinal nodes that were negative. The recommendation at thoracic conferencettempt to repeat his EBUS. Needle bx of his peripheral lesion was felt to be lower yield. I will work on setting this up with the patient, will call back Dr Clarene Duke to clarify the plan with him. If EBUS non-diagnostic and suspicion remains high then I believe the patient needs a chest surgery.

## 2012-09-21 NOTE — Telephone Encounter (Signed)
Spoke to patient about review of his CT scan in thoracic conference. He has also apparently been referred to Medical Oncology to discuss next week. The consensus of Dr Arbutus Ped (Oncology), Drs Tyrone Sage and Dorris Fetch (Thoracic Surgery), and myself was that he needs repeat biopsy. Patient understandably is frustrated that, given our suspicion that this is malignancy, he doesn't have a diagnosis. A thoracic surgery does not look like the answer here; medical oncology is not going to treat him without a tissue dx; the peripheral lesion looks like post-obstruction, but we could try to needle bx it. The consensus was that EBUS of the enlarging L hilar lesion would be higher yield. The patient wants to talk to his PCP DR Little about the options. I have attempted to call Dr Joya San but his office is closed today. It seems to me that patient either needs repeat EBUS at [facility] or he should be referred to another center to revisit the options for bx or surgery. The patient has asked me to call Dr Clarene Duke and then to call him back. I will do so Monday.

## 2012-09-21 NOTE — Telephone Encounter (Signed)
Spoke with pt regarding appt.  Told him that discussion at conference with Dr. Delton Coombes, Dr. Tyrone Sage, and Dr. Arbutus Ped was to cancel the appt with Dr. Arbutus Ped next week.  He verbalized understanding of cancellation of appt.

## 2012-09-24 ENCOUNTER — Telehealth: Payer: Self-pay | Admitting: Internal Medicine

## 2012-09-24 NOTE — Telephone Encounter (Signed)
appts confirmed.  

## 2012-09-25 ENCOUNTER — Telehealth: Payer: Self-pay | Admitting: Internal Medicine

## 2012-09-25 ENCOUNTER — Ambulatory Visit: Payer: Medicare Other | Admitting: Internal Medicine

## 2012-09-25 ENCOUNTER — Other Ambulatory Visit: Payer: Medicare Other | Admitting: Lab

## 2012-09-25 ENCOUNTER — Telehealth: Payer: Self-pay | Admitting: *Deleted

## 2012-09-25 ENCOUNTER — Ambulatory Visit: Payer: Medicare Other

## 2012-09-25 NOTE — Telephone Encounter (Signed)
sister called and lm to verify todays appt,per notes andtiff the appt has been cancelled.  i called her back and she was not aware of this so i left a vm for dana for her to call her,aware

## 2012-09-25 NOTE — Telephone Encounter (Signed)
Discussed the case and the recommendations of the Thoracic Conference with Dr Clarene Duke for another attempt at EBUS with bx of L hilar lesion. Dr Clarene Duke agrees that this is probably best way to get the lesion. If the tissue is non-diagnostic then ? Needle bx or VATS to get peripheral lesion. Another option would be referral to tertiary center to get another opinion. I will mention this to Mr Ozaeta also.

## 2012-09-25 NOTE — Telephone Encounter (Signed)
Spoke with sister and Hospice nurse regarding cancellation of appt.  I have spoken with Dr. Delton Coombes and Dr. Arbutus Ped regarding appt.  Pt does not have tissue dx and both Dr.'s requested appt be cancelled.  I spoke to Mr. Oravec about this issue.  Today, I clarified with sister and Hospice nurse about appt.

## 2012-09-25 NOTE — Telephone Encounter (Signed)
Spoke with patient regarding thoracic conference, my discussions with DR Little. Patient agrees that we should perform repeat EBUS. I will start setting it up.

## 2012-09-28 ENCOUNTER — Encounter (HOSPITAL_COMMUNITY): Payer: Self-pay | Admitting: Pharmacy Technician

## 2012-09-28 ENCOUNTER — Telehealth: Payer: Self-pay | Admitting: Emergency Medicine

## 2012-09-28 NOTE — Telephone Encounter (Signed)
Spoke to debbie and gave her instructions for EBUS 10/02/12 [name]

## 2012-10-01 ENCOUNTER — Telehealth: Payer: Self-pay | Admitting: Emergency Medicine

## 2012-10-01 ENCOUNTER — Encounter (HOSPITAL_COMMUNITY): Payer: Self-pay | Admitting: *Deleted

## 2012-10-01 NOTE — Telephone Encounter (Signed)
Referral Notes         Type Date User    General 09/28/2012 10:05 AM Shelton Silvas E             Note    EBUS 10/02/12@9 :30am pt aware [name]     -----  Per libby they had to R/S pt to 10/08/12 to 11 AM pt will need to arrive at short stay at 9 AM. I called and made [facility] aware of this, She wrote this down. She needed nothing further

## 2012-10-01 NOTE — Telephone Encounter (Signed)
ATC they are closed will call back in the AM. Pt surgery is cancelled for tomorrow. It was r/s for 10/08/12

## 2012-10-02 NOTE — Telephone Encounter (Signed)
This is an EBUS, not an ENB. i just sent the orders in. Thanks

## 2012-10-02 NOTE — Telephone Encounter (Signed)
Called Jan at # above -- spoke with Alcario Drought who states Jan is off today.  Advised Erica that RB has placed orders for EBUS.  Per Alcario Drought, she does see the orders and nothing further needed.

## 2012-10-02 NOTE — Telephone Encounter (Signed)
ENB moved to 10/08/12. RB, [facility] requesting pre admit orders for this pt if this has not already been done. Thanks!

## 2012-10-08 ENCOUNTER — Encounter (HOSPITAL_COMMUNITY): Payer: Self-pay | Admitting: Certified Registered Nurse Anesthetist

## 2012-10-08 ENCOUNTER — Ambulatory Visit (HOSPITAL_COMMUNITY): Payer: Medicare Other | Admitting: Certified Registered Nurse Anesthetist

## 2012-10-08 ENCOUNTER — Ambulatory Visit (HOSPITAL_COMMUNITY): Payer: Medicare Other

## 2012-10-08 ENCOUNTER — Ambulatory Visit (HOSPITAL_COMMUNITY)
Admission: RE | Admit: 2012-10-08 | Discharge: 2012-10-08 | Disposition: A | Discharge status: Home / Self Care | Payer: Medicare Other | Source: Ambulatory Visit | Attending: Emergency Medicine | Admitting: Emergency Medicine

## 2012-10-08 ENCOUNTER — Encounter (HOSPITAL_COMMUNITY)
Admission: RE | Disposition: A | Discharge status: Home / Self Care | Payer: Self-pay | Source: Ambulatory Visit | Attending: Emergency Medicine

## 2012-10-08 ENCOUNTER — Encounter (HOSPITAL_COMMUNITY): Payer: Self-pay | Admitting: *Deleted

## 2012-10-08 DIAGNOSIS — R918 Other nonspecific abnormal finding of lung field: Secondary | ICD-10-CM

## 2012-10-08 DIAGNOSIS — J449 Chronic obstructive pulmonary disease, unspecified: Secondary | ICD-10-CM | POA: Insufficient documentation

## 2012-10-08 DIAGNOSIS — J4489 Other specified chronic obstructive pulmonary disease: Secondary | ICD-10-CM | POA: Insufficient documentation

## 2012-10-08 DIAGNOSIS — K219 Gastro-esophageal reflux disease without esophagitis: Secondary | ICD-10-CM | POA: Insufficient documentation

## 2012-10-08 DIAGNOSIS — I10 Essential (primary) hypertension: Secondary | ICD-10-CM | POA: Insufficient documentation

## 2012-10-08 DIAGNOSIS — K449 Diaphragmatic hernia without obstruction or gangrene: Secondary | ICD-10-CM | POA: Insufficient documentation

## 2012-10-08 DIAGNOSIS — F411 Generalized anxiety disorder: Secondary | ICD-10-CM | POA: Insufficient documentation

## 2012-10-08 DIAGNOSIS — R599 Enlarged lymph nodes, unspecified: Secondary | ICD-10-CM | POA: Insufficient documentation

## 2012-10-08 DIAGNOSIS — E119 Type 2 diabetes mellitus without complications: Secondary | ICD-10-CM | POA: Insufficient documentation

## 2012-10-08 DIAGNOSIS — R222 Localized swelling, mass and lump, trunk: Secondary | ICD-10-CM | POA: Insufficient documentation

## 2012-10-08 HISTORY — PX: VIDEO BRONCHOSCOPY WITH ENDOBRONCHIAL ULTRASOUND: SHX6177

## 2012-10-08 LAB — COMPREHENSIVE METABOLIC PANEL
Calcium: 9.9 mg/dL (ref 8.4–10.5)
GFR calc Af Amer: >90 mL/min (ref >90)
GFR calc non Af Amer: >90 mL/min (ref >90)
Glucose, Bld: 112 mg/dL — ABNORMAL HIGH (ref 70–99)
Potassium: 4.2 mEq/L (ref 3.5–5.1)
Sodium: 139 mEq/L (ref 135–145)
Total Bilirubin: 0.3 mg/dL (ref 0.3–1.2)

## 2012-10-08 LAB — CBC
Hemoglobin: 13.9 g/dL (ref 13.0–17.0)
MCH: 31.3 pg (ref 26.0–34.0)
MCHC: 34.1 g/dL (ref 30.0–36.0)
Platelets: 195 10*3/uL (ref 150–400)

## 2012-10-08 LAB — PROTIME-INR
INR: 0.99 (ref 0.00–1.49)
Prothrombin Time: 13 seconds (ref 11.6–15.2)

## 2012-10-08 SURGERY — BRONCHOSCOPY, WITH EBUS
Anesthesia: General | Wound class: Clean Contaminated

## 2012-10-08 MED ORDER — LIDOCAINE HCL (CARDIAC) 20 MG/ML IV SOLN
INTRAVENOUS | Status: DC | PRN
  Administered 2012-10-08 (×2): 50 mg via INTRAVENOUS

## 2012-10-08 MED ORDER — ONDANSETRON HCL 4 MG/2ML IJ SOLN
INTRAMUSCULAR | Status: DC | PRN
  Administered 2012-10-08: 4 mg via INTRAVENOUS

## 2012-10-08 MED ORDER — GLYCOPYRROLATE 0.2 MG/ML IJ SOLN
INTRAMUSCULAR | Status: DC | PRN
  Administered 2012-10-08: .8 mg via INTRAVENOUS

## 2012-10-08 MED ORDER — FENTANYL CITRATE 0.05 MG/ML IJ SOLN
INTRAMUSCULAR | Status: DC | PRN
  Administered 2012-10-08 (×2): 50 ug via INTRAVENOUS

## 2012-10-08 MED ORDER — 0.9 % SODIUM CHLORIDE (POUR BTL) OPTIME
TOPICAL | Status: DC | PRN
  Administered 2012-10-08: 1000 mL

## 2012-10-08 MED ORDER — OXYCODONE HCL 10 MG PO TABS: 10.0000 mg | ORAL_TABLET | Freq: Three times a day (TID) | ORAL | Status: DC | PRN

## 2012-10-08 MED ORDER — LIDOCAINE HCL 4 % MT SOLN
OROMUCOSAL | Status: DC | PRN
  Administered 2012-10-08: 4 mL via TOPICAL

## 2012-10-08 MED ORDER — NEOSTIGMINE METHYLSULFATE 1 MG/ML IJ SOLN
INTRAMUSCULAR | Status: DC | PRN
  Administered 2012-10-08: 5 mg via INTRAVENOUS

## 2012-10-08 MED ORDER — MUPIROCIN 2 % EX OINT
TOPICAL_OINTMENT | Freq: Two times a day (BID) | CUTANEOUS | Status: DC
  Filled 2012-10-08: qty 22

## 2012-10-08 MED ORDER — ESMOLOL HCL 10 MG/ML IV SOLN
INTRAVENOUS | Status: DC | PRN
  Administered 2012-10-08 (×3): 10 mg via INTRAVENOUS

## 2012-10-08 MED ORDER — CYCLOBENZAPRINE HCL 10 MG PO TABS: 10.0000 mg | ORAL_TABLET | Freq: Two times a day (BID) | ORAL | Status: DC | PRN

## 2012-10-08 MED ORDER — LACTATED RINGERS IV SOLN
INTRAVENOUS | Status: DC | PRN
  Administered 2012-10-08 (×2): via INTRAVENOUS

## 2012-10-08 MED ORDER — LACTATED RINGERS IV SOLN
INTRAVENOUS | Status: DC
  Administered 2012-10-08: 11:00:00 via INTRAVENOUS

## 2012-10-08 MED ORDER — PROPOFOL 10 MG/ML IV BOLUS
INTRAVENOUS | Status: DC | PRN
  Administered 2012-10-08: 50 mg via INTRAVENOUS
  Administered 2012-10-08: 150 mg via INTRAVENOUS

## 2012-10-08 MED ORDER — FENTANYL CITRATE 0.05 MG/ML IJ SOLN: 25.0000 ug | INTRAMUSCULAR | Status: DC | PRN

## 2012-10-08 MED ORDER — ROCURONIUM BROMIDE 100 MG/10ML IV SOLN
INTRAVENOUS | Status: DC | PRN
  Administered 2012-10-08: 40 mg via INTRAVENOUS

## 2012-10-08 SURGICAL SUPPLY — 21 items
BRUSH CYTOL CELLEBRITY 1.5X140 (MISCELLANEOUS) ×2 IMPLANT
CANISTER SUCTION 2500CC (MISCELLANEOUS) ×2 IMPLANT
CLOTH BEACON ORANGE TIMEOUT ST (SAFETY) ×2 IMPLANT
CONT SPEC 4OZ CLIKSEAL STRL BL (MISCELLANEOUS) ×2 IMPLANT
COVER TABLE BACK 60X90 (DRAPES) ×2 IMPLANT
FORCEPS BIOP RJ4 1.8 (CUTTING FORCEPS) ×2 IMPLANT
GLOVE BIOGEL M STRL SZ7.5 (GLOVE) ×2 IMPLANT
KIT ROOM TURNOVER OR (KITS) ×2 IMPLANT
MARKER SKIN DUAL TIP RULER LAB (MISCELLANEOUS) ×2 IMPLANT
NEEDLE BIOPSY TRANSBRONCH 21G (NEEDLE) IMPLANT
NEEDLE SYS SONOTIP II EBUSTBNA (NEEDLE) ×4 IMPLANT
NS IRRIG 1000ML POUR BTL (IV SOLUTION) ×2 IMPLANT
OIL SILICONE PENTAX (PARTS (SERVICE/REPAIRS)) ×2 IMPLANT
PAD ARMBOARD 7.5X6 YLW CONV (MISCELLANEOUS) ×4 IMPLANT
SPONGE GAUZE 4X4 12PLY (GAUZE/BANDAGES/DRESSINGS) ×2 IMPLANT
SYR 20CC LL (SYRINGE) ×2 IMPLANT
SYR 20ML ECCENTRIC (SYRINGE) ×2 IMPLANT
SYR 5ML LUER SLIP (SYRINGE) IMPLANT
TOWEL OR 17X24 6PK STRL BLUE (TOWEL DISPOSABLE) ×2 IMPLANT
TRAP SPECIMEN MUCOUS 40CC (MISCELLANEOUS) ×2 IMPLANT
TUBE CONNECTING 12X1/4 (SUCTIONS) ×2 IMPLANT

## 2012-10-08 NOTE — Preoperative (Signed)
Beta Blockers   Reason not to administer Beta Blockers:Not Applicable 

## 2012-10-08 NOTE — Anesthesia Preprocedure Evaluation (Addendum)
Anesthesia Evaluation  Patient identified by MRN, date of birth, ID band Patient awake    Reviewed: Allergy & Precautions, H&P , NPO status , Patient's Chart, lab work & pertinent test results  Airway Mallampati: II      Dental   Pulmonary shortness of breath and with exertion, COPD breath sounds clear to auscultation        Cardiovascular hypertension, Pt. on medications Rhythm:Regular Rate:Normal     Neuro/Psych Anxiety    GI/Hepatic Neg liver ROS, GERD-  ,  Endo/Other  negative endocrine ROS  Renal/GU negative Renal ROS     Musculoskeletal   Abdominal   Peds  Hematology negative hematology ROS (+)   Anesthesia Other Findings   Reproductive/Obstetrics                           Anesthesia Physical Anesthesia Plan  ASA: III  Anesthesia Plan: General   Post-op Pain Management:    Induction: Intravenous  Airway Management Planned: Oral ETT  Additional Equipment:   Intra-op Plan:   Post-operative Plan: Possible Post-op intubation/ventilation  Informed Consent: I have reviewed the patients History and Physical, chart, labs and discussed the procedure including the risks, benefits and alternatives for the proposed anesthesia with the patient or authorized representative who has indicated his/her understanding and acceptance.     Plan Discussed with: CRNA and Anesthesiologist  Anesthesia Plan Comments:         Anesthesia Quick Evaluation

## 2012-10-08 NOTE — Progress Notes (Signed)
Pt arrived to [facility] c/o neck pain and headache.  Pt states he fell on Sat. Night and hit his head.  Dr. Delton Coombes notified and orders given to obtain a C-spine xray.  Pt sent to xray stat for xray via stretcher.

## 2012-10-08 NOTE — H&P (View-Only) (Signed)
  Subjective:    Patient ID: Tyler [name], male    DOB: September 28, 1941, 71 y.o.   MRN: [id number]  HPI 71 yo former heavy smoker, hx HTN, DM, allergies. Has been dx with COPD about 2 weeks ago, started on Advair but only uses it about once a week. Has been rx for PNA in the past, including as a child. Was referred for abnormal CXR and CT scan chest by Dr Clarene Duke. He was well until 2 months ago when he developed gastroenteritis w Nausea, diarrhea. Finally got better but he has had poor appetite, has lost about 20 lbs over 2 months. May be a bit better now. Part of the eval for his wt loss, especially w smoking hx, was CXR and then CT scan. His eval revealed erythrocytosis, some renal insufficiency. The CT scan shows a L hilar area that looks like scar vs bronchiectasis.   ROV 06/29/12 -- follow up visit following his FOB/EBUS and then subsequent mediastinoscopy. The pathology was negative for malignancy. He is still having nausea, bitter taste in his mouth despite omeprazole tid. He is on Advair, restarted it a month ago. Seems to be helping his breathing.   08/30/2012 acute w/in ov / Wert cc new sob since 10/27 ok at rest, assoc with new ant pleuritic and positional L CP > ct chest 08/29/12 1. Increase in size of the spiculated lesion noted previously in  the left suprahilar region with narrowing of the bronchus to the  superior segment of the left lower lobe and a new nodular opacity  within the left lower lobe. These findings are highly suspicious  for primary lung carcinoma with metastatic involvement of the left  lower lobe.  2. No increase in mediastinal or hilar adenopathy is seen.  3. Centrilobular emphysema.  4. Moderate sized hiatal hernia  ROV 09/13/12 -- COPD, L hilar mass that has been negative on EBUS and mediastinoscopy. Now with more distal nodular LLL opacity, ? Post-obstructive process vs malignancy. He saw Dr Sherene Sires 10/31 and started symbicort >. Feels that it has helped some. He has  started hospice services through Dr Clarene Duke, ostensibly for lung cancer.       Objective:   Physical Exam Filed Vitals:   09/13/12 1441  BP: 140/78  Pulse: 107  Temp: 97.5 F (36.4 C)   Gen: Pleasant, well-nourished, in no distress,  normal affect  ENT: No lesions,  mouth clear,  oropharynx clear, no postnasal drip  Neck: No JVD, no TMG, no carotid bruits, mediastinoscopy incision looks good  Lungs: No use of accessory muscles, no dullness to percussion, clear without rales or rhonchi  Cardiovascular: RRR, heart sounds normal, no murmur or gallops, no peripheral edema  Musculoskeletal: No deformities, no cyanosis or clubbing  Neuro: alert, non focal  Skin: Warm, no lesions or rashes    Assessment & Plan:  Lung mass The proximal lesion has been biopsied, path negative. Now case is complicated by fact that there is a more distal lesion - either post-obstruction or mass. He believes that he is to be referred to cancer center, but he also has been referred to hospice. I discussed with him today - he is willing to go to [facility] to discuss options for bx and to hear options for treatment. I will refer him to [facility] and for discussion at thoracic conference.  COPD (chronic obstructive pulmonary disease) Continue symbicort and O2 as ordered

## 2012-10-08 NOTE — Op Note (Signed)
Video Bronchoscopy with Endobronchial Ultrasound Procedure Note  Date of Operation: 10/08/2012  Pre-op Diagnosis: L hilar mass  Post-op Diagnosis: same  Surgeon: [name]  Assistants: none  Anesthesia: General endotracheal anesthesia  Operation: Flexible video fiberoptic bronchoscopy with endobronchial ultrasound and biopsies.  Estimated Blood Loss: Minimal  Complications: none apparent  Indications and History: [name] is a 71 y.o. male with CT scan that shows an enlarging L hilar lesion and possible more distal LLL nodular lesion, either nodule or post-obstructive changes. He has undergone non-diagnostic EBUS and mediastinoscopy before. He returns for repeat attempt at biopsy since the lesion has enlarged.  The risks, benefits, complications, treatment options and expected outcomes were discussed with the patient.  The possibilities of pneumothorax, pneumonia, reaction to medication, pulmonary aspiration, perforation of a viscus, bleeding, failure to diagnose a condition and creating a complication requiring transfusion or operation were discussed with the patient who freely signed the consent.    Description of Procedure: The patient was examined in the preoperative area and history and data from the preprocedure consultation were reviewed. It was deemed appropriate to proceed.  The patient was taken to [facility] OR 10, identified as Tyler [name] and the procedure verified as Flexible Video Fiberoptic Bronchoscopy.  A Time Out was held and the above information confirmed. General anesthesia was initiated and the patient  was orally intubated. The video fiberoptic bronchoscope was introduced via the endotracheal tube and a general inspection was performed which showed normal airways with the exception of narrowing of the superior segmental airway of the LLL. The standard scope was then withdrawn and the endobronchial ultrasound was used to identify and characterize the L hilar  mass and lymph nodes. Using real-time ultrasound guidance Wang needle biopsies were take from Station 12L node and L hilar mass were pooled and and were sent for cytology. The patient tolerated the procedure well without apparent complications. There was no significant blood loss. The bronchoscope was withdrawn. Anesthesia was reversed and the patient was taken to the PACU for recovery.   Samples: Wang needle biopsies from 12L node  Plans:  The patient will be discharged from the PACU to home when recovered from anesthesia. We will review the cytology, pathology results with the patient when they become available. Outpatient followup will be with Dr Delton Coombes.   [name], MD, PhD 10/08/2012, 1:34 PM [facility] [phone] or if no answer [phone]

## 2012-10-08 NOTE — Anesthesia Postprocedure Evaluation (Signed)
  Anesthesia Post-op Note  Patient: Tyler [name]  Procedure(s) Performed: Procedure(s) (LRB) with comments: VIDEO BRONCHOSCOPY WITH ENDOBRONCHIAL ULTRASOUND (N/A)  Patient Location: PACU  Anesthesia Type:General  Level of Consciousness: awake  Airway and Oxygen Therapy: Patient Spontanous Breathing  Post-op Pain: mild  Post-op Assessment: Post-op Vital signs reviewed  Post-op Vital Signs: Reviewed  Complications: No apparent anesthesia complications

## 2012-10-08 NOTE — Transfer of Care (Signed)
Immediate Anesthesia Transfer of Care Note  Patient: Tyler [name]  Procedure(s) Performed: Procedure(s) (LRB) with comments: VIDEO BRONCHOSCOPY WITH ENDOBRONCHIAL ULTRASOUND (N/A)  Patient Location: PACU  Anesthesia Type:General  Level of Consciousness: awake and alert   Airway & Oxygen Therapy: Patient Spontanous Breathing and Patient connected to face mask oxygen  Post-op Assessment: Report given to PACU RN and Post -op Vital signs reviewed and stable  Post vital signs: Reviewed and stable  Complications: No apparent anesthesia complications

## 2012-10-08 NOTE — Interval H&P Note (Signed)
PCCM Interval Hx:   Tyler [name] presents today for repeat bx of L hilar lesion. I discussed his case with Thoracic Conference and it was recommended that best strategy would be repeat EBUS of his L hilar mass.   He tells me that since our last meeting he has been seen by hospice, was started on MS Contin, oxycodone for chronic back pain. His BP medications apparently were stopped. He experienced confusion so he stopped the MS contin and is still using the oxycodone.   Unfortunately he tells met that he fell 3 days ago, hit is neck and his head. He has been having L upper neck pain and HA ever since.   Filed Vitals:   10/08/12 0744  BP: 170/71  Pulse: 83  Temp: 98.2 F (36.8 C)  Resp: 20   Exam - pt is more lethargic, slower to respond than at our previous visit.   Plain films of the neck 12/9 >> no acute fx or abnormality  CT scan head 12/9 >>   Plans:  If Head Ct scan is reassuring then we will proceed with EBUS and bx's. If there is any abnormality on the scan, then we will defer and reschedule when/if safe to do so.   [name], MD, PhD 10/08/2012, 11:25 AM [facility] [phone] or if no answer [phone]

## 2012-10-08 NOTE — Anesthesia Procedure Notes (Signed)
Procedure Name: Intubation Date/Time: 10/08/2012 12:12 PM Performed by: Margaree Mackintosh Pre-anesthesia Checklist: Patient identified, Timeout performed, Emergency Drugs available, Suction available and Patient being monitored Patient Re-evaluated:Patient Re-evaluated prior to inductionOxygen Delivery Method: Circle system utilized Preoxygenation: Pre-oxygenation with 100% oxygen Intubation Type: IV induction Ventilation: Mask ventilation without difficulty and Oral airway inserted - appropriate to patient size Laryngoscope Size: Mac and 4 Grade View: Grade II Tube type: Oral Tube size: 8.5 mm Number of attempts: 1 Airway Equipment and Method: Stylet and LTA kit utilized Placement Confirmation: ETT inserted through vocal cords under direct vision,  positive ETCO2 and breath sounds checked- equal and bilateral Secured at: 23 cm Tube secured with: Tape Dental Injury: Teeth and Oropharynx as per pre-operative assessment

## 2012-10-09 ENCOUNTER — Encounter (HOSPITAL_COMMUNITY): Payer: Self-pay | Admitting: Emergency Medicine

## 2012-10-10 ENCOUNTER — Telehealth: Payer: Self-pay | Admitting: Emergency Medicine

## 2012-10-10 NOTE — Telephone Encounter (Signed)
Please notify pt that his bx's were normal - no cancer cells. I think he needs to see dr Lowella Fairy if at all possible to discuss alternative biopsy. If he cancels then he definitely needs to reschedule. Please also call pt's sister with these results if possible

## 2012-10-10 NOTE — Telephone Encounter (Addendum)
Pt is requesting biopsy results. Also they wanted to know that it was ok to cancel appt tomorrow with Dr. Tyrone Sage. The pt is still very sore from falling a few days ago. I advised ok to cancel appt if pt is too sore to go and to reschedule in the future. Please advise on results. Carron Curie, CMA

## 2012-10-11 ENCOUNTER — Ambulatory Visit: Payer: Medicare Other | Admitting: Cardiothoracic Surgery

## 2012-10-11 NOTE — Telephone Encounter (Signed)
pts daughter called back and she is aware of biopsy results per RB.  She is aware that the pt will need to follow up with Dr. Nydia Bouton and to keep the 1 month follow up with RB on 12/23.  Nothing further is needed.

## 2012-10-12 ENCOUNTER — Encounter (HOSPITAL_COMMUNITY): Payer: Self-pay | Admitting: Emergency Medicine

## 2012-10-12 ENCOUNTER — Emergency Department (HOSPITAL_COMMUNITY): Payer: Medicare Other

## 2012-10-12 ENCOUNTER — Inpatient Hospital Stay (HOSPITAL_COMMUNITY)
Admission: EM | Admit: 2012-10-12 | Discharge: 2012-10-18 | DRG: 897 | Disposition: A | Discharge status: Skilled Nursing Facility | Payer: Medicare Other | Attending: Family Medicine | Admitting: Family Medicine

## 2012-10-12 DIAGNOSIS — K219 Gastro-esophageal reflux disease without esophagitis: Secondary | ICD-10-CM | POA: Diagnosis present

## 2012-10-12 DIAGNOSIS — Z87891 Personal history of nicotine dependence: Secondary | ICD-10-CM

## 2012-10-12 DIAGNOSIS — F19921 Other psychoactive substance use, unspecified with intoxication with delirium: Principal | ICD-10-CM | POA: Diagnosis present

## 2012-10-12 DIAGNOSIS — R4182 Altered mental status, unspecified: Secondary | ICD-10-CM

## 2012-10-12 DIAGNOSIS — T481X5A Adverse effect of skeletal muscle relaxants [neuromuscular blocking agents], initial encounter: Secondary | ICD-10-CM | POA: Diagnosis present

## 2012-10-12 DIAGNOSIS — J449 Chronic obstructive pulmonary disease, unspecified: Secondary | ICD-10-CM | POA: Diagnosis present

## 2012-10-12 DIAGNOSIS — T424X5A Adverse effect of benzodiazepines, initial encounter: Secondary | ICD-10-CM | POA: Diagnosis present

## 2012-10-12 DIAGNOSIS — R222 Localized swelling, mass and lump, trunk: Secondary | ICD-10-CM | POA: Diagnosis present

## 2012-10-12 DIAGNOSIS — F05 Delirium due to known physiological condition: Secondary | ICD-10-CM

## 2012-10-12 DIAGNOSIS — F1020 Alcohol dependence, uncomplicated: Secondary | ICD-10-CM | POA: Diagnosis present

## 2012-10-12 DIAGNOSIS — M542 Cervicalgia: Secondary | ICD-10-CM | POA: Diagnosis present

## 2012-10-12 DIAGNOSIS — N400 Enlarged prostate without lower urinary tract symptoms: Secondary | ICD-10-CM | POA: Diagnosis present

## 2012-10-12 DIAGNOSIS — R410 Disorientation, unspecified: Secondary | ICD-10-CM | POA: Diagnosis present

## 2012-10-12 DIAGNOSIS — R000 Tachycardia, unspecified: Secondary | ICD-10-CM | POA: Diagnosis present

## 2012-10-12 DIAGNOSIS — F1110 Opioid abuse, uncomplicated: Secondary | ICD-10-CM

## 2012-10-12 DIAGNOSIS — F1010 Alcohol abuse, uncomplicated: Secondary | ICD-10-CM

## 2012-10-12 DIAGNOSIS — Z66 Do not resuscitate: Secondary | ICD-10-CM | POA: Diagnosis present

## 2012-10-12 DIAGNOSIS — J4489 Other specified chronic obstructive pulmonary disease: Secondary | ICD-10-CM | POA: Diagnosis present

## 2012-10-12 DIAGNOSIS — T40605A Adverse effect of unspecified narcotics, initial encounter: Secondary | ICD-10-CM | POA: Diagnosis present

## 2012-10-12 DIAGNOSIS — I10 Essential (primary) hypertension: Secondary | ICD-10-CM | POA: Diagnosis present

## 2012-10-12 LAB — HEPATIC FUNCTION PANEL
ALT: 9 U/L (ref 0–53)
Bilirubin, Direct: 0.2 mg/dL (ref 0.0–0.3)
Indirect Bilirubin: 0.4 mg/dL (ref 0.3–0.9)
Total Protein: 7.2 g/dL (ref 6.0–8.3)

## 2012-10-12 LAB — CBC
HCT: 37.5 % — ABNORMAL LOW (ref 39.0–52.0)
Hemoglobin: 12.8 g/dL — ABNORMAL LOW (ref 13.0–17.0)
MCHC: 34.1 g/dL (ref 30.0–36.0)
RBC: 4.11 MIL/uL — ABNORMAL LOW (ref 4.22–5.81)

## 2012-10-12 LAB — URINALYSIS, ROUTINE W REFLEX MICROSCOPIC
Bilirubin Urine: NEGATIVE
Glucose, UA: NEGATIVE mg/dL
Hgb urine dipstick: NEGATIVE
Ketones, ur: 15 mg/dL — AB
Nitrite: NEGATIVE
Specific Gravity, Urine: 1.018 (ref 1.005–1.030)
pH: 5.5 (ref 5.0–8.0)

## 2012-10-12 LAB — CBC WITH DIFFERENTIAL/PLATELET
Basophils Absolute: 0 10*3/uL (ref 0.0–0.1)
Basophils Relative: 0 % (ref 0–1)
Eosinophils Relative: 2 % (ref 0–5)
HCT: 37.3 % — ABNORMAL LOW (ref 39.0–52.0)
Hemoglobin: 12.6 g/dL — ABNORMAL LOW (ref 13.0–17.0)
Lymphocytes Relative: 19 % (ref 12–46)
MCHC: 33.8 g/dL (ref 30.0–36.0)
MCV: 91.2 fL (ref 78.0–100.0)
Monocytes Absolute: 0.9 10*3/uL (ref 0.1–1.0)
Monocytes Relative: 11 % (ref 3–12)
RDW: 13.1 % (ref 11.5–15.5)

## 2012-10-12 LAB — RAPID URINE DRUG SCREEN, HOSP PERFORMED
Barbiturates: NOT DETECTED
Tetrahydrocannabinol: NOT DETECTED

## 2012-10-12 LAB — CREATININE, SERUM
Creatinine, Ser: 0.67 mg/dL (ref 0.50–1.35)
GFR calc Af Amer: >90 mL/min (ref >90)
GFR calc non Af Amer: >90 mL/min (ref >90)

## 2012-10-12 LAB — BASIC METABOLIC PANEL
BUN: 11 mg/dL (ref 6–23)
CO2: 28 mEq/L (ref 19–32)
Calcium: 9.5 mg/dL (ref 8.4–10.5)
Creatinine, Ser: 0.76 mg/dL (ref 0.50–1.35)

## 2012-10-12 LAB — ACETAMINOPHEN LEVEL: Acetaminophen (Tylenol), Serum: <15 ug/mL (ref 10–30)

## 2012-10-12 LAB — SALICYLATE LEVEL: Salicylate Lvl: <2 mg/dL — ABNORMAL LOW (ref 2.8–20.0)

## 2012-10-12 LAB — TROPONIN I: Troponin I: <0.3 ng/mL (ref <0.30)

## 2012-10-12 LAB — MRSA PCR SCREENING: MRSA by PCR: NEGATIVE

## 2012-10-12 MED ORDER — DEXTROSE 5 % IV SOLN
1.0000 g | Freq: Once | INTRAVENOUS | Status: AC
  Administered 2012-10-12: 1 g via INTRAVENOUS
  Filled 2012-10-12: qty 10

## 2012-10-12 MED ORDER — LORAZEPAM 1 MG PO TABS: 1.0000 mg | ORAL_TABLET | Freq: Four times a day (QID) | ORAL | Status: DC | PRN

## 2012-10-12 MED ORDER — VITAMIN B-1 100 MG PO TABS
100.0000 mg | ORAL_TABLET | Freq: Every day | ORAL | Status: DC
  Administered 2012-10-13 – 2012-10-18 (×4): 100 mg via ORAL
  Filled 2012-10-12 (×6): qty 1

## 2012-10-12 MED ORDER — METOPROLOL TARTRATE 1 MG/ML IV SOLN
5.0000 mg | Freq: Two times a day (BID) | INTRAVENOUS | Status: DC | PRN
  Administered 2012-10-12 – 2012-10-15 (×5): 5 mg via INTRAVENOUS
  Filled 2012-10-12 (×5): qty 5

## 2012-10-12 MED ORDER — ADULT MULTIVITAMIN W/MINERALS CH
1.0000 | ORAL_TABLET | Freq: Every day | ORAL | Status: DC
  Administered 2012-10-13: 1 via ORAL
  Filled 2012-10-12 (×3): qty 1

## 2012-10-12 MED ORDER — LORAZEPAM 2 MG/ML IJ SOLN
0.5000 mg | Freq: Once | INTRAMUSCULAR | Status: AC
  Administered 2012-10-12: 0.5 mg via INTRAVENOUS
  Filled 2012-10-12: qty 1

## 2012-10-12 MED ORDER — ACETAMINOPHEN 325 MG PO TABS: 650.0000 mg | ORAL_TABLET | Freq: Four times a day (QID) | ORAL | Status: DC | PRN

## 2012-10-12 MED ORDER — LORAZEPAM 2 MG/ML IJ SOLN
1.0000 mg | Freq: Four times a day (QID) | INTRAMUSCULAR | Status: DC | PRN
  Administered 2012-10-12 – 2012-10-13 (×4): 1 mg via INTRAVENOUS
  Filled 2012-10-12 (×3): qty 1

## 2012-10-12 MED ORDER — SODIUM CHLORIDE 0.9 % IJ SOLN
3.0000 mL | Freq: Two times a day (BID) | INTRAMUSCULAR | Status: DC
  Administered 2012-10-12 – 2012-10-18 (×11): 3 mL via INTRAVENOUS

## 2012-10-12 MED ORDER — SODIUM CHLORIDE 0.9 % IV SOLN
INTRAVENOUS | Status: DC
  Administered 2012-10-12: 17:00:00 via INTRAVENOUS

## 2012-10-12 MED ORDER — POTASSIUM CHLORIDE IN NACL 20-0.9 MEQ/L-% IV SOLN
INTRAVENOUS | Status: DC
  Administered 2012-10-13: 10:00:00 via INTRAVENOUS
  Filled 2012-10-12 (×3): qty 1000

## 2012-10-12 MED ORDER — LORAZEPAM 2 MG/ML IJ SOLN
INTRAMUSCULAR | Status: AC
  Filled 2012-10-12: qty 1

## 2012-10-12 MED ORDER — PANTOPRAZOLE SODIUM 40 MG IV SOLR
40.0000 mg | INTRAVENOUS | Status: DC
  Administered 2012-10-12 – 2012-10-15 (×4): 40 mg via INTRAVENOUS
  Filled 2012-10-12 (×6): qty 40

## 2012-10-12 MED ORDER — THIAMINE HCL 100 MG/ML IJ SOLN
Freq: Once | INTRAVENOUS | Status: AC
  Administered 2012-10-12: 22:00:00 via INTRAVENOUS
  Filled 2012-10-12: qty 1000

## 2012-10-12 MED ORDER — DEXTROSE 5 % IV SOLN
500.0000 mg | Freq: Once | INTRAVENOUS | Status: AC
  Administered 2012-10-12: 500 mg via INTRAVENOUS
  Filled 2012-10-12: qty 500

## 2012-10-12 MED ORDER — HEPARIN SODIUM (PORCINE) 5000 UNIT/ML IJ SOLN
5000.0000 [IU] | Freq: Three times a day (TID) | INTRAMUSCULAR | Status: DC
  Administered 2012-10-12 – 2012-10-18 (×16): 5000 [IU] via SUBCUTANEOUS
  Filled 2012-10-12 (×21): qty 1

## 2012-10-12 MED ORDER — ACETAMINOPHEN 650 MG RE SUPP: 650.0000 mg | Freq: Four times a day (QID) | RECTAL | Status: DC | PRN

## 2012-10-12 MED ORDER — ALBUTEROL SULFATE (5 MG/ML) 0.5% IN NEBU: 2.5000 mg | INHALATION_SOLUTION | RESPIRATORY_TRACT | Status: DC | PRN

## 2012-10-12 MED ORDER — SODIUM CHLORIDE 0.9 % IV BOLUS (SEPSIS)
1000.0000 mL | Freq: Once | INTRAVENOUS | Status: AC
  Administered 2012-10-12: 1000 mL via INTRAVENOUS

## 2012-10-12 MED ORDER — FOLIC ACID 1 MG PO TABS
1.0000 mg | ORAL_TABLET | Freq: Every day | ORAL | Status: DC
  Administered 2012-10-13: 1 mg via ORAL
  Filled 2012-10-12 (×3): qty 1

## 2012-10-12 MED ORDER — THIAMINE HCL 100 MG/ML IJ SOLN
100.0000 mg | Freq: Every day | INTRAMUSCULAR | Status: DC
  Administered 2012-10-15: 100 mg via INTRAVENOUS
  Filled 2012-10-12 (×5): qty 1

## 2012-10-12 NOTE — ED Notes (Signed)
Pt c/o altered LOC x 2 days; pt c/o severe HA; pt had fall on Saturday with some normal CT done; pt had lung biopsy on Monday; pt lethargic and leaning to left; pt mae but difficult to follow commands; pt head slumped forward; per family started two days ago but much more severe upon waking today; pt normally cares for self and drives

## 2012-10-12 NOTE — ED Provider Notes (Signed)
History     CSN: [id number]  Arrival date & time 10/12/12  1222   First MD Initiated Contact with Patient 10/12/12 1240      Chief Complaint  Patient presents with  . Altered Mental Status    (Consider location/radiation/quality/duration/timing/severity/associated sxs/prior treatment) HPI     level V caveat for urgent need for intervention.  Daughter reports altered mental status for approximately 2 days.  Status post lung biopsy past Monday by Dr Delton Coombes, local pulmonologist at [facility].  CT head also obtained on the same day which was normal.  Patient normally takes care of his activities of daily living.  He has a chronically sore neck.  No obvious fever, sweats, chills, dysuria, chest pain, dyspnea.  He does not follow commands well  Past Medical History  Diagnosis Date  . Allergic rhinitis   . BPH (benign prostatic hyperplasia)   . GERD (gastroesophageal reflux disease)   . COPD (chronic obstructive pulmonary disease)   . Shortness of breath   . Arthritis   . Enlarged prostate   . Hypertension     dr Shon Hale [place]   pcp      dr Jens Som  cardiac md  . Mental disorder   . Constipation     Past Surgical History  Procedure Date  . Ankle surgery 2005  . Prostate surgery 11/25/2010  . Hernia repair   . Mediastinoscopy 06/11/2012    Procedure: MEDIASTINOSCOPY;  Surgeon: Delight Ovens, MD;  Location: [facility] OR;  Service: Thoracic;  Laterality: N/A;  . Inguinal hernia repair     left  . Eye surgery     cataract right  . Video bronchoscopy with endobronchial ultrasound 10/08/2012    Procedure: VIDEO BRONCHOSCOPY WITH ENDOBRONCHIAL ULTRASOUND;  Surgeon: Leslye Peer, MD;  Location: [facility] OR;  Service: Pulmonary;  Laterality: N/A;    Family History  Problem Relation Age of Onset  . Heart disease Mother   . Arthritis Sister   . Arthritis Sister   . Liver cancer Father     History  Substance Use Topics  . Smoking status: Former Smoker -- 2.0 packs/day for 50 years     Types: Cigarettes    Quit date: 12/26/2009  . Smokeless tobacco: Never Used  . Alcohol Use: 14.4 oz/week    24 Cans of beer per week     Comment: 4 beers every night      Review of Systems  Unable to perform ROS: Other    Allergies  Ketorolac tromethamine; Codeine; Hydrocod polst-cpm polst er; Tramadol; and Tussionex pennkinetic er  Home Medications   Current Outpatient Rx  Name  Route  Sig  Dispense  Refill  . CYCLOBENZAPRINE HCL 10 MG PO TABS   Oral   Take 1 tablet (10 mg total) by mouth 2 (two) times daily as needed for muscle spasms.         . GUAIFENESIN 400 MG PO TABS   Oral   Take 400 mg by mouth 3 (three) times daily.         Marland Kitchen OMEPRAZOLE 20 MG PO CPDR   Oral   Take 20 mg by mouth 3 (three) times daily.          . OXYCODONE HCL 10 MG PO TABS   Oral   Take 1 tablet (10 mg total) by mouth every 8 (eight) hours as needed.         Bernadette Hoit SODIUM 8.6-50 MG PO TABS   Oral  Take 1 tablet by mouth 3 (three) times daily.         Marland Kitchen TAMSULOSIN HCL 0.4 MG PO CAPS   Oral   Take 0.4 mg by mouth every morning.            BP 134/81  Pulse 82  Temp 98.7 F (37.1 C) (Oral)  Resp 18  SpO2 96%  Physical Exam  Nursing note and vitals reviewed. Constitutional:       Confused.  HENT:  Head: Normocephalic and atraumatic.  Eyes: Conjunctivae normal are normal. Pupils are equal, round, and reactive to light.  Neck: Normal range of motion. Neck supple.       Pain with flexion. family reports chronic neck pain  Cardiovascular: Normal rate, regular rhythm and normal heart sounds.   Pulmonary/Chest: Effort normal and breath sounds normal.  Abdominal: Soft. Bowel sounds are normal.  Musculoskeletal:       Moves all extremities, sluggishly  Neurological:       Unable  Skin: Skin is warm and dry.       Flushed face  Psychiatric:       Unable    ED Course  Procedures (including critical care time)  Labs Reviewed  CBC WITH  DIFFERENTIAL - Abnormal; Notable for the following:    RBC 4.09 (*)     Hemoglobin 12.6 (*)     HCT 37.3 (*)     All other components within normal limits  BASIC METABOLIC PANEL - Abnormal; Notable for the following:    Sodium 134 (*)     Glucose, Bld 132 (*)     All other components within normal limits  URINALYSIS, ROUTINE W REFLEX MICROSCOPIC - Abnormal; Notable for the following:    Ketones, ur 15 (*)     All other components within normal limits  TROPONIN I  URINE CULTURE   No results found.   No diagnosis found.   Date: 10/12/2012  Rate: 100  Rhythm: sinus tachycardia  QRS Axis: normal  Intervals: normal  ST/T Wave abnormalities: normal  Conduction Disutrbances:none  Narrative Interpretation:   Old EKG Reviewed: changes noted PVC, PAC Ct Head Wo Contrast  10/12/2012  *RADIOLOGY REPORT*  Clinical Data:  Altered mental status.  Severe headache.  Recent fall.  CT HEAD WITHOUT CONTRAST CT CERVICAL SPINE WITHOUT CONTRAST  Technique:  Multidetector CT imaging of the head and cervical spine was performed following the standard protocol without intravenous contrast.  Multiplanar CT image reconstructions of the cervical spine were also generated.  Comparison:  CT head 10/08/2012  CT HEAD  Findings: Ventricle size is normal.  Negative for intracranial hemorrhage.  Negative for acute infarct or mass.  Age appropriate atrophy is present. 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  Mild chronic microvascular ischemia in the white matter.  Negative for skull fracture.  IMPRESSION: No acute abnormality.  CT CERVICAL SPINE  Findings: Patient positioning was difficult to.  The patient did hold still for the study.  Negative for fracture.  Disc degeneration and spondylosis are present at C5-6 and C6-7.  Mild anterior slip C5-6.  There is significant facet degeneration on the left at C4-5 and C5-6.  There is foraminal encroachment bilaterally at C5-6.  Mild left foraminal encroachment at  C4-5.  IMPRESSION: Cervical degenerative changes.  Negative for fracture.   Original Report Authenticated By: Janeece Riggers, M.D.    Ct Cervical Spine Wo Contrast  10/12/2012  *RADIOLOGY REPORT*  Clinical Data:  Altered mental status.  Severe headache.  Recent fall.  CT HEAD WITHOUT CONTRAST CT CERVICAL SPINE WITHOUT CONTRAST  Technique:  Multidetector CT imaging of the head and cervical spine was performed following the standard protocol without intravenous contrast.  Multiplanar CT image reconstructions of the cervical spine were also generated.  Comparison:  CT head 10/08/2012  CT HEAD  Findings: Ventricle size is normal.  Negative for intracranial hemorrhage.  Negative for acute infarct or mass.  Age appropriate atrophy is present. 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  Mild chronic microvascular ischemia in the white matter.  Negative for skull fracture.  IMPRESSION: No acute abnormality.  CT CERVICAL SPINE  Findings: Patient positioning was difficult to.  The patient did hold still for the study.  Negative for fracture.  Disc degeneration and spondylosis are present at C5-6 and C6-7.  Mild anterior slip C5-6.  There is significant facet degeneration on the left at C4-5 and C5-6.  There is foraminal encroachment bilaterally at C5-6.  Mild left foraminal encroachment at C4-5.  IMPRESSION: Cervical degenerative changes.  Negative for fracture.   Original Report Authenticated By: Janeece Riggers, M.D.    Dg Chest Port 1 View  10/12/2012  *RADIOLOGY REPORT*  Clinical Data: Confusion  PORTABLE CHEST - 1 VIEW  Comparison: 10/08/2012  Findings: Hypoaeration with interstitial and vascular crowding. Prominent cardiomediastinal contours, may be accentuated by differences in inspiratory effort and technique.  Retrocardiac opacity is suggested as is a mild right lung base opacity.  No pneumothorax.  Small effusions not excluded.  No acute osseous change.  IMPRESSION: Degraded by hypoaeration, body habitus,  and portable technique. Bibasilar opacities; atelectasis versus pneumonia.  Consider PA and lateral follow-up.   Original Report Authenticated By: Jearld Lesch, M.D.     CRITICAL CARE Performed by: Donnetta Hutching   Total critical care time: 30  Critical care time was exclusive of separately billable procedures and treating other patients.  Critical care was necessary to treat or prevent imminent or life-threatening deterioration.  Critical care was time spent personally by me on the following activities: development of treatment plan with patient and/or surrogate as well as nursing, discussions with consultants, evaluation of patient's response to treatment, examination of patient, obtaining history from patient or surrogate, ordering and performing treatments and interventions, ordering and review of laboratory studies, ordering and review of radiographic studies, pulse oximetry and re-evaluation of patient's condition. MDM   Uncertain etiology of patient's mental status changes.  Chest x-ray shows possibility of basilar pneumonia.  Rx IV Rocephin and IV Zithromax.  Possibility of meningitis entertained, but patient has chronic neck pain which clouds the picture.  White count normal.  This was discussed with the admitting physician.       Donnetta Hutching, MD 10/16/12 [phone]

## 2012-10-12 NOTE — H&P (Signed)
Family Medicine Teaching [facility] Admission History and Physical Service Pager: [phone]  Patient name: Tyler [name] Medical record number: [id number] Date of birth: [birth year] Age: 71 y.o. Gender: male  Primary Care Provider: Aida Puffer, MD  Chief Complaint: Acute Delirium   Assessment and Plan: Tyler [name] is a 71 y.o. year old male presenting with altered mental status for 5 days 1. Acute Delirium - Most likely secondary to substance intoxication as he has many mental altering drugs onboard with increased dosing including Xanax 1 mg tid, Oxycodone 10 mg q 4 hrs, Flexeril 10 mg tid.  Low Likelihood this could be TIA/CVA as patient has had 5 days of this, no focal neurologic weakness, no facial droop, and CT scan does not show acute changes.  However, if pt does not improve consider further imaging. Low Likelihood as well that this may be meningitis.  No WBC, no fever, no focal neurologic deficits. Is c/o some neck stiffness, however, he does have baseline neck and back pain and per daughter this has not changed over the last several days.  Did get dose of Rocephin but consider LP if pt starts to have focal neurologic deficits along with fever/leukocytosis.  1. Holding all psychologic altering drugs at this time.  If pt continues to have AMS despite this, consider one time dose of Narcan.   2. Will get LFT's to evaluate liver function as could be contributing to his prolonged clearance 3. UDS, Salicylate Level, Acetaminophen Level, EtOH level to evaluate for secondary causes 4. Telemetry, vitals and neuro checks q shift.  Pt failed bedside swallow study.  NPO until formal SLP 5. Will repeat BMP, EKG, and CBC in AM 6. If pt becomes combative, QTc at .  Would consider low dose Haldol at 2 mg PRN 2. COPD - Pt has signs of hyperinflation on CXR. 1. O2 saturations at 96% on RA 2. Pulses Ox with vitals.   3. O2 PRN 4. Previous notes state pt is on Symbicort for his COPD.   Not on his home meds list and daughter unsure.  Will only order Albuterol at this time PRN for SOB.  3. Bibasilar Opacities on CXR - Started on Rocephin and Azithromycin x 1 for possible CAP 1. No WBC, afebrile, no productive cough, no hypoxia 2. Believe this is most likely positional with poor inspiratory effort/possible atelectasis 3. If pt starts to develop hypoxia, fever, WBC,low threshold to restart Rocephin and Azithro 4. Lung Mass - Being followed by Dr. Delton Coombes, pulmonology 1. Consider discussing with Dr. Delton Coombes about further management 2. CT head does not show mets to the brain.  5. HTN - Pt recently taken off his BP medication (Bystolic due to it's B1 selectivity) around 2 months ago 1. Continue to monitor.  If elevated, consider starting low dose metoprolol  6. Chronic Alcohol Use - Unsure of the extent of this  1. Will get LFT's for further evaluation.  Consider possible ammonia for changes in mentation and INR for liver functioning 2. CIWA protocol as pt may be going through withdrawal  3. CSW for alcohol counseling 7. FEN/GI: D5 1/2 NS @100  cc/hr for 12 hrs due to NPO.  Then NS @ 100 cc/hr.  Protonix IV 40 mg 8. Prophylaxis: Heparin SQ 9. Disposition: Improvement in mentation.  Pending PT/OT/CSW consults as well 10. Code Status: DNR (Will be bringing in paper from home)   History of Present Illness: Tyler VUOLO is a 71 y.o. year old male presenting with altered  mental status progressing over the last 5 days.  The history is obtained from his daughter and sister who were present in the room during the admission.  Pt was in his normal state of health up until about 5 days ago when he started to have trouble with memory, weakness, drooling, and was not acting himself.  Pt normally is able to care for himself, make himself meals, dress himself, shave, and drive.  However, during this time period he was unable to make himself meals, shave, or dress himself.  Around three days ago, pt  did have one fall landing on the back of his head and was seen by Dr. Delton Coombes, his oncologist, that day.  He had  CT scan performed that did not show acute changes at that time.  Pt continued to have worsening mentation since that office visit and today, he started to become incomprehensibleness and inability to communicate in which he was brought to the ED.    In the ED, he was evaluated for possible multiple causes of AMS.  A BMP showed an elevated glucose to 132, a CBC was WNL without a white count, a Troponin was negative x 1, UA positive for ketones (15), UCx pending, CT head without acute changes, a CXR showing bibasilar opacities, EKG unchanged from previous.  He was started on NS @ 125 cc/hr, Rocephin and Azithromycin x 1 for possible PNA, and given a dose of Ativan x 1 for agitation.  A bedside swallow study was performed in which the patient was unable to tolerate.    Pt's family denies recent fever, chills, falls, CP, increased SOB, productive cough, urinary symptoms, bowel habit changes, nausea/vomiting, diarrhea.    Pt is being seen by Dr. Delton Coombes for lung mass in which he has biopsied about 4 times, with negative pathology along with a PET scan that did not show evidence of an active CA.  He has a smoking history of over 50 years, greater than one ppd.  Continues to follow with Dr. Delton Coombes.   Recent changes to patient's health include being followed by hospice care due to deterioration in his overall function.  He was previously on oxycodone 5 mg (q 4 hrs) until around 6 weeks ago when this was increased to 10 mg.  He was also started on Xanax around 2-3 weeks ago, 1 mg tid, and per daughter " He will eat those things like candy".  Pt has a significant drinking history as well, as both daughter and sister cannot quantify the amount at this time.   Patient Active Problem List  Diagnosis  . COPD (chronic obstructive pulmonary disease)  . Lung mass  . Preop cardiovascular exam  . Chest pain  .  Hypertension   Past Medical History: Past Medical History  Diagnosis Date  . Allergic rhinitis   . BPH (benign prostatic hyperplasia)   . GERD (gastroesophageal reflux disease)   . COPD (chronic obstructive pulmonary disease)   . Shortness of breath   . Arthritis   . Enlarged prostate   . Hypertension     dr Shon Hale [place]   pcp      dr Jens Som  cardiac md  . Mental disorder   . Constipation    Past Surgical History: Past Surgical History  Procedure Date  . Ankle surgery 2005  . Prostate surgery 11/25/2010  . Hernia repair   . Mediastinoscopy 06/11/2012    Procedure: MEDIASTINOSCOPY;  Surgeon: Delight Ovens, MD;  Location: [facility] OR;  Service: Thoracic;  Laterality: N/A;  . Inguinal hernia repair     left  . Eye surgery     cataract right  . Video bronchoscopy with endobronchial ultrasound 10/08/2012    Procedure: VIDEO BRONCHOSCOPY WITH ENDOBRONCHIAL ULTRASOUND;  Surgeon: Leslye Peer, MD;  Location: [facility] OR;  Service: Pulmonary;  Laterality: N/A;   Social History: History  Substance Use Topics  . Smoking status: Former Smoker -- 2.0 packs/day for 50 years    Types: Cigarettes    Quit date: 12/26/2009  . Smokeless tobacco: Never Used  . Alcohol Use: 14.4 oz/week    24 Cans of beer per week     Comment: 4 beers every night   For any additional social history documentation, please refer to relevant sections of EMR.  Family History: Family History  Problem Relation Age of Onset  . Heart disease Mother   . Arthritis Sister   . Arthritis Sister   . Liver cancer Father    Allergies: Allergies  Allergen Reactions  . Ketorolac Tromethamine Anaphylaxis  . Codeine Itching  . Hydrocod Polst-Cpm Polst Er Rash  . Tramadol Rash  . Tussionex Pennkinetic Er (Hydrocod Polst-Cpm Polst Er) Rash   No current facility-administered medications on file prior to encounter.   Current Outpatient Prescriptions on File Prior to Encounter  Medication Sig Dispense Refill  .  guaifenesin (HUMIBID E) 400 MG TABS Take 400 mg by mouth 3 (three) times daily as needed. For cough      . omeprazole (PRILOSEC) 20 MG capsule Take 20 mg by mouth 3 (three) times daily.       . Tamsulosin HCl (FLOMAX) 0.4 MG CAPS Take 0.4 mg by mouth every morning.        Review Of Systems: Per HPI   Physical Exam: BP 178/82  Pulse 111  Temp 97.9 F (36.6 C) (Oral)  Resp 24  SpO2 96% Exam: Constitutional: Confused, Disheveled  Head: Normocephalic and atraumatic.  Eyes: Conjunctivae noninjected. PERRLA, EOMI B/L  Mouth: Dry MM, no pharyngeal exudate  Neck: no LAD, no JVD Cardiovascular: RRR, no murmurs appreciated  Pulmonary/Chest: Effort normal and breath sounds normal. No wheezes heard  Abdominal: Soft. Bowel sounds are normal.  Musculoskeletal:  Moves all extremities Neurological: CN 2-12 intact, MS 5/5 UE and LE, sluggish to follow commands but able.  Skin: Skin is warm and dry.  Psychiatric: Confused, Awake, Oriented to person and place.  Believes today is the 15th instead of the 13th.  Believes it's November instead of December.     Labs and Imaging: CBC BMET   Lab 10/12/12 1305  WBC 8.1  HGB 12.6*  HCT 37.3*  PLT 167    Lab 10/12/12 1305  NA 134*  K 3.6  CL 96  CO2 28  BUN 11  CREATININE 0.76  GLUCOSE 132*  CALCIUM 9.5      Bryan R. Hess, DO of [facility] 10/12/2012, 5:55 PM  PGY-3 Addendum  I have seen and evaluated patient with Dr. Paulina Fusi and agree with exam and plan as outlined above with the following additions in Red.

## 2012-10-12 NOTE — ED Notes (Signed)
Patient becoming restless wanting IV removed moving around in bed Daughter asked if it is possible to give medication to calm patient down. Doctor notified.

## 2012-10-13 ENCOUNTER — Encounter (HOSPITAL_COMMUNITY): Payer: Self-pay

## 2012-10-13 ENCOUNTER — Observation Stay (HOSPITAL_COMMUNITY): Payer: Medicare Other

## 2012-10-13 LAB — CBC
HCT: 36.6 % — ABNORMAL LOW (ref 39.0–52.0)
Hemoglobin: 12.9 g/dL — ABNORMAL LOW (ref 13.0–17.0)
MCHC: 35.2 g/dL (ref 30.0–36.0)
RBC: 4.06 MIL/uL — ABNORMAL LOW (ref 4.22–5.81)
WBC: 10.2 10*3/uL (ref 4.0–10.5)

## 2012-10-13 LAB — BASIC METABOLIC PANEL
BUN: 9 mg/dL (ref 6–23)
Chloride: 100 mEq/L (ref 96–112)
GFR calc Af Amer: >90 mL/min (ref >90)
GFR calc non Af Amer: >90 mL/min (ref >90)
Potassium: 3.5 mEq/L (ref 3.5–5.1)
Sodium: 136 mEq/L (ref 135–145)

## 2012-10-13 MED ORDER — HALOPERIDOL LACTATE 5 MG/ML IJ SOLN
5.0000 mg | Freq: Once | INTRAMUSCULAR | Status: AC
  Administered 2012-10-14: 5 mg via INTRAVENOUS
  Filled 2012-10-13: qty 1

## 2012-10-13 MED ORDER — MORPHINE SULFATE 2 MG/ML IJ SOLN: 2.0000 mg | INTRAMUSCULAR | Status: DC | PRN

## 2012-10-13 MED ORDER — SODIUM CHLORIDE 0.9 % IV BOLUS (SEPSIS)
1000.0000 mL | Freq: Once | INTRAVENOUS | Status: AC
  Administered 2012-10-14: 1000 mL via INTRAVENOUS

## 2012-10-13 MED ORDER — MORPHINE SULFATE 2 MG/ML IJ SOLN
2.0000 mg | INTRAMUSCULAR | Status: DC | PRN
  Filled 2012-10-13: qty 1

## 2012-10-13 MED ORDER — DIPHENHYDRAMINE HCL 50 MG/ML IJ SOLN
50.0000 mg | INTRAMUSCULAR | Status: AC
  Administered 2012-10-13: 50 mg via INTRAVENOUS
  Filled 2012-10-13: qty 1

## 2012-10-13 MED ORDER — LORAZEPAM BOLUS VIA INFUSION
1.0000 mg | Freq: Once | INTRAVENOUS | Status: AC
  Administered 2012-10-13: 1 mg via INTRAVENOUS

## 2012-10-13 MED ORDER — HALOPERIDOL LACTATE 5 MG/ML IJ SOLN
5.0000 mg | Freq: Once | INTRAMUSCULAR | Status: AC
  Administered 2012-10-13: 5 mg via INTRAVENOUS
  Filled 2012-10-13: qty 1

## 2012-10-13 MED ORDER — MORPHINE SULFATE 2 MG/ML IJ SOLN
2.0000 mg | INTRAMUSCULAR | Status: DC | PRN
  Administered 2012-10-13 – 2012-10-14 (×6): 2 mg via INTRAVENOUS
  Filled 2012-10-13 (×7): qty 1

## 2012-10-13 MED ORDER — HALOPERIDOL LACTATE 5 MG/ML IJ SOLN: 5.0000 mg | Freq: Four times a day (QID) | INTRAMUSCULAR | Status: DC | PRN

## 2012-10-13 MED ORDER — POTASSIUM CHLORIDE IN NACL 20-0.9 MEQ/L-% IV SOLN
INTRAVENOUS | Status: AC
  Administered 2012-10-14: 1000 mL via INTRAVENOUS
  Filled 2012-10-13 (×2): qty 1000

## 2012-10-13 NOTE — Progress Notes (Signed)
While obtaining history from patients daughter she states that patient has attempted suicide multiple times in the past and frequently verbalizes suicidal thoughts. Patient is currently has a 1:1 sitter at the bedside. He denies current thoughts of self-harm. Patient is also confused at this time. Notified Dr. Pollie Meyer.

## 2012-10-13 NOTE — Progress Notes (Addendum)
Family stating that patient has left sided - facial droop - that was not present yesterday. Patient has been laying in bed with his head turned to the left the majority of the morning. Facial droop not noted earlier. Grips are strong and equal bilaterally. Pupils equal and reactive. Physician, Dr. Ardeth Sportsman with [facility] notified.

## 2012-10-13 NOTE — Progress Notes (Signed)
Family Medicine Teaching Service Attending Note  I interviewed and examined patient Tyler [name] and reviewed their tests and x-rays.  I discussed with Dr. Pollie Meyer and reviewed their note for today.  I agree with their assessment and plan.     Additionally  This AM mental status is about the same intermittently oriented and able to follow suggestions and count fingers Neuro exam is nonfocal equal strength PERRL  Neck is still equally with flex exten and rotation  No kernigs Afebrile normal wbc   Still most consistent with medication/etoh delirium Watch closely for signs of infection Start low dose long acting oral narcotics Continue as needed ativan for agitation Frequent reorientation hopefully his family can assist

## 2012-10-13 NOTE — Progress Notes (Signed)
Family Medicine Teaching Service Daily Progress Note Service Page: [phone]  Patient Assessment: Tyler [name] is a 71 y.o. year old male presenting with altered mental status for 5 days.  Subjective: Overnight patient was agitated and received Haldol 5mg  and Benadryl 50mg . Per nurse this morning, he was tugging at IV and monitor leads overnight as well, and somewhat combative this morning. He complains of pain in his neck.  Objective: Temp:  [97.9 F (36.6 C)-98.7 F (37.1 C)] 98.5 F (36.9 C) (12/14 0035) Pulse Rate:  [82-117] 111  (12/14 0035) Resp:  [16-24] 24  (12/14 0035) BP: (134-189)/(79-93) 159/79 mmHg (12/14 0035) SpO2:  [94 %-98 %] 98 % (12/13 1944) Weight:  [222 lb 10.6 oz (101 kg)-222 lb 14.2 oz (101.1 kg)] 222 lb 10.6 oz (101 kg) (12/14 0035) Exam: General: disheveled appearing, moving around in bed, cooperative when exam is explained to him clearly HEENT: normocephalic, pain in neck with passive flexion Cardiovascular: regular rhythm, slightly tachycardic Respiratory: lungs difficult to auscultate due to transmitted upper airway snoring sound, is coughing intermittently and producing sputum Abdomen: soft, nontender, nondistended Extremities: lower extremities nontender to palpation Neuro: knows it's the 13th year and the 12th month, knows he's in [place], per nurse was oriented earlier to being at [facility], [place], moves all extremities spontaneously. Speech slurred but understandable at times.  I have reviewed the patient's medications, labs, imaging, and diagnostic testing.  Notable results are summarized below.  Medications:  Scheduled Meds: Azithromycin x1 in ED Ceftriaxone x1 in ED Benadryl IV 50mg  x1 Haldol IV 5mg  x1 Folate 1mg  daily Heparin 5000u TID Ativan 0.5mg  IV x1 MVI daily Protonix 40mg  IV daily  PRN Meds: Tylenol 650 q6h prn Albuterol 2.5 mg neb q2h prn Ativan 1mg  q6h prn CIWA protocol Metoprolol 5mg  BID prn high BP  IVF: NS @  125cc/hr NS with 20KCl @ 100 cc/hr  Labs:  CBC  Lab 10/13/12 0454 10/12/12 2115 10/12/12 1305  WBC 10.2 10.5 8.1  HGB 12.9* 12.8* 12.6*  HCT 36.6* 37.5* 37.3*  PLT 180 182 167    BMET  Lab 10/13/12 0454 10/12/12 2115 10/12/12 1305 10/08/12 0701  NA 136 -- 134* 139  K 3.5 -- 3.6 4.2  CL 100 -- 96 102  CO2 24 -- 28 28  BUN 9 -- 11 11  CREATININE 0.65 0.67 0.76 --  GLUCOSE 122* -- 132* 112*  CALCIUM 9.6 -- 9.5 9.9   AST 15 ALT 9 Alk Phos 105 Total bili 0.6 Total protein 7.2 Albumin 3.5 PT 13 INR 0.99  Salicylate <2 (neg) Acetaminophen <15 (neg) Ethanol <11 (neg)  UDS + for benzodiazepines, no opiates   Imaging/Diagnostic Tests: CT Cervical Spine 12/13: Cervical degenerative changes. Negative for fracture.   CT Head 12/13: No acute abnormality.  CXR 12/13: Degraded by hypoaeration, body habitus, and portable technique. Bibasilar opacities; atelectasis versus pneumonia. Consider PA and lateral follow-up.   Assessment/Plan: Tyler [name] is a 71 y.o. year old male presenting with altered mental status for 5 days. Differential diagnosis includes substance intoxication, CVA, meningitis. Most likely secondary to substance intoxication as he has many mental altering drugs onboard with increased dosing including Xanax 1 mg tid, Oxycodone 10 mg q 4 hrs, Flexeril 10 mg tid. Low Likelihood this could be TIA/CVA as patient has had 5 days of this, no focal neurologic weakness, no facial droop, and CT scan does not show acute changes.  Low Likelihood as well that this may be meningitis. No WBC, no fever,  no focal neurologic deficits. Is c/o some neck stiffness, however, he does have baseline neck and back pain and per daughter this has not changed over the last several days.   # Acute Delirium - most likely secondary to new medications -Pt appears possibly more oriented this morning than upon admission, complaining of neck pain -LFT's obtained yesterday, and are normal  along with PT/INR (suggest normal clearance of medications) -UDS positive for benzos only -Salicylate Level, Acetaminophen Level, EtOH levels all negative -Telemetry, vitals and neuro checks q shift. Pt failed bedside swallow study. NPO until more awake & alert, formal SLP. -If mental status does not improve consider further imaging, although may require sedation for MRI -Consider LP if pt starts to have focal neurologic deficits along with fever/leukocytosis.  -recheck labs in AM (cbc, bmet)  # Neck Pain -Pt with chronic neck pain at home, and taking chronic narcotics -Will give morphine 2mg  q4hprn pain, to see if pain control alleviates some agitation  # Prolonged QTc -EKG this morning shows QTc of 487, up from 454 yesterday -Will avoid further doses of medications that prolong QTc (including Haldol)  # COPD - Pt has signs of hyperinflation on CXR, with bibasilar opacities but film is of poor quality -O2 saturations at 96% on RA, give O2 prn to keep sats >88 -continuous pulse ox while altered mental status -Previous notes state pt is on Symbicort for his COPD. Not on his home meds list and daughter unsure. Will only order Albuterol at this time PRN for SOB.  -s/p dose of ceftriaxone and azithromycin for CAP given CXR findings -Believe CXR findings most likely positional with poor inspiratory effort/possible atelectasis -No WBC, afebrile, no hypoxia, but does seem to be producing sputum this morning (although may be just oral secretions) -If pt starts to develop hypoxia, fever, leukocytosis, low threshold to restart Rocephin and Azithro   # Lung Mass - Being followed by Dr. Delton Coombes, pulmonology  -Consider discussing with Dr. Delton Coombes about further management -CT head does not show mets to the brain.   # HTN - Pt recently taken off his BP medication (Bystolic due to it's B1 selectivity) around 2 months ago -BP's persistently high overnight, with tachycardia to 117. -Continue to monitor and  discuss starting low dose metoprolol  # Chronic Alcohol Use - Unsure of the extent of this. -LFT's and INR normal, suggest normal liver functioning -CIWA protocol as pt may be going through withdrawal  -CSW for alcohol counseling   # FEN/GI:  -NS with 20KCl @ 100 cc/hr -Protonix IV 40 mg -NPO until passes SLP  # Prophylaxis: Heparin SQ # Disposition: Pending improvement in mentation and PT/OT/CSW consults # Code Status: DNR (Will be bringing in paper from home)    Levert Feinstein, MD [facility] Medicine PGY-1

## 2012-10-13 NOTE — Progress Notes (Signed)
Patient examined after new facial droop noted.  Family feels this is definitely new as is his speech slurring  He is alert and oriented to his family but not consistently to place or month. Jokes frequently Neuro Exam Definite inability to raise the left corner of his mouth when asked to smile Able to maintain seal when asked to blow up his cheeks Strong eye closing bilaterally PERRL EOMI Gravely gargly voice Strength 5/5 in all extremities Leans to left side - complains of pain in neck when moved more erect   Possible acute CVA Stat MRI - given small amount of IV morphine to help his remain still  Keep NPO Not a candidate for TPA given very focal nature of deficit unless this widens Reassess Neuro function frequently If no bleed or tumor start rectal asa until has swallow study

## 2012-10-13 NOTE — H&P (Addendum)
Family Medicine Teaching Service Attending Note  On 12/13 I interviewed and examined patient Tyler [name] and reviewed their tests and x-rays.  I discussed with Dr. Ashley Royalty and reviewed their note for today.  I agree with their assessment and plan.     Additionally  Presentation most consistent with medication/etoh induced delirium.  No focal CNS signs and very low likelihood of meningitis or pneumonia  given no fever or wbc and that his neck stiffness and pain is chronic (as corroborated by his family). Detox from medications and follow mental status and vs  closely  Would monitor    This I performed bedside swallow test with water and straw.  Was able to drink sips without cough or any change in his baseline dysarthria.  Will give dysphagia diet

## 2012-10-14 LAB — URINE CULTURE
Colony Count: NO GROWTH
Culture: NO GROWTH

## 2012-10-14 MED ORDER — HYDRALAZINE HCL 20 MG/ML IJ SOLN
10.0000 mg | Freq: Four times a day (QID) | INTRAMUSCULAR | Status: DC | PRN
  Administered 2012-10-14 – 2012-10-16 (×5): 10 mg via INTRAVENOUS
  Filled 2012-10-14 (×5): qty 0.5

## 2012-10-14 NOTE — Progress Notes (Signed)
Patient ID: Tyler [name], male   DOB: June 23, 1941, 71 y.o.   MRN: [id number] Family Medicine Teaching Service Daily Progress Note Service Page: [phone]   Subjective: Still with some agitation yesterday and last night.  Sitter in place, states that he is better this morning.  He has not complaints today and seems to be improved slightly from time of admission.   Objective: Temp:  [97.9 F (36.6 C)-99 F (37.2 C)] 98.6 F (37 C) (12/15 0717) Pulse Rate:  [88-110] 101  (12/15 0431) Resp:  [15-28] 23  (12/15 0717) BP: (115-196)/(11-108) 196/95 mmHg (12/15 0717) SpO2:  [93 %-100 %] 93 % (12/15 0431) Weight:  [216 lb 0.8 oz (98 kg)] 216 lb 0.8 oz (98 kg) (12/15 0431) Exam: General: disheveled appearing, moving around in bed, cooperative when exam is explained to him clearly HEENT: normocephalic, pain in neck with passive flexion, but not ridgidity. Cardiovascular: regular rhythm, slightly tachycardic Respiratory: lungs difficult to auscultate due to transmitted upper airway snoring sound, is coughing intermittently and producing sputum Abdomen: soft, nontender, nondistended Extremities: lower extremities nontender to palpation Neuro:Oriented to person, place and to year and month. Unable to give day of week or day of month.  PERRL, moves all extremities spontaneously. Speech slurred but understandable at times.  Smile still slightly asymmetric   I have reviewed the patient's medications, labs, imaging, and diagnostic testing.  Notable results are summarized below.  Medications:  Scheduled Meds: Azithromycin x1 in ED Ceftriaxone x1 in ED Benadryl IV 50mg  x1 Haldol IV 5mg  x1 Folate 1mg  daily Heparin 5000u TID Ativan 0.5mg  IV x1 MVI daily Protonix 40mg  IV daily  PRN Meds: Tylenol 650 q6h prn Albuterol 2.5 mg neb q2h prn Ativan 1mg  q6h prn CIWA protocol Metoprolol 5mg  BID prn high BP  IVF: NS @ 125cc/hr NS with 20KCl @ 100 cc/hr  Labs:  CBC  Lab 10/13/12 0454 10/12/12  2115 10/12/12 1305  WBC 10.2 10.5 8.1  HGB 12.9* 12.8* 12.6*  HCT 36.6* 37.5* 37.3*  PLT 180 182 167    BMET  Lab 10/13/12 0454 10/12/12 2115 10/12/12 1305 10/08/12 0701  NA 136 -- 134* 139  K 3.5 -- 3.6 4.2  CL 100 -- 96 102  CO2 24 -- 28 28  BUN 9 -- 11 11  CREATININE 0.65 0.67 0.76 --  GLUCOSE 122* -- 132* 112*  CALCIUM 9.6 -- 9.5 9.9     Imaging/Diagnostic Tests: CT Cervical Spine 12/13: Cervical degenerative changes. Negative for fracture.   CT Head 12/13: No acute abnormality.  CXR 12/13: Degraded by hypoaeration, body habitus, and portable technique. Bibasilar opacities; atelectasis versus pneumonia. Consider PA and lateral follow-up.   [name]  No acute stroke is evident.  There is mild atrophy with chronic microvascular ischemic change.  Correlate clinically with regard to the left facial weakness.       Assessment/Plan: Tyler [name] is a 71 y.o. year old male presenting with altered mental status for 5 days. Differential diagnosis includes substance intoxication, CVA, meningitis. Most likely secondary to substance intoxication as he has many mental altering drugs onboard with increased dosing including Xanax 1 mg tid, Oxycodone 10 mg q 4 hrs, Flexeril 10 mg tid. Low Likelihood this could be TIA/CVA as patient has had 5 days of this, no focal neurologic weakness, no facial droop, and CT scan does not show acute changes.  Low Likelihood as well that this may be meningitis. No WBC, no fever, no focal neurologic deficits. Is  c/o some neck stiffness, however, he does have baseline neck and back pain and per daughter this has not changed over the last several days.   # Acute Delirium - most likely secondary to new medications -Pt appears possibly more oriented this morning than upon admission, complaining of neck pain -LFT's normal along with PT/INR (suggest normal clearance of medications) -Salicylate Level, Acetaminophen Level, EtOH levels all  negative -Telemetry, vitals and neuro checks q shift. Pt failed bedside swallow study. NPO until more awake & alert, formal SLP. -If mental status does not improve consider further imaging, although may require sedation for MRI -Consider LP if pt starts to have focal neurologic deficits along with fever/leukocytosis.  -recheck labs in AM (cbc, bmet)  # Neck Pain -Pt with chronic neck pain at home, and taking chronic narcotics -Will give morphine 2mg  q4hprn pain, to see if pain control alleviates some agitation  # Prolonged QTc -EKG this morning shows QTc of 487, up from 454 yesterday -Will avoid further doses of medications that prolong QTc (including Haldol)  # COPD - Pt has signs of hyperinflation on CXR, with bibasilar opacities but film is of poor quality -O2 saturations at 96% on RA, monitor closely.  -continuous pulse ox while altered mental status -Previous notes state pt is on Symbicort for his COPD. Not on his home meds list and daughter unsure. Will only order Albuterol at this time PRN for SOB.  -s/p dose of ceftriaxone and azithromycin for CAP given CXR findings -No WBC, afebrile, no hypoxia, thus antibiotics discontinued at admission   # Lung Mass - Being followed by Dr. Delton Coombes, pulmonology  -Tissue biopsy has been inconclusive to date -CT and MRI head does not show mets to the brain.   # HTN - Pt recently taken off his BP medication (Bystolic due to it's B1 selectivity) around 2 months ago -BP's persistently high overnight -Has metoprolol prn, will also add on hydralazine.  # Chronic Alcohol Use - Unsure of the extent of this. -LFT's and INR normal, suggest normal liver functioning -CIWA protocol as pt may be going through withdrawal  -CSW for alcohol counseling   # FEN/GI:  -NS with 20KCl @ 100 cc/hr -Protonix IV 40 mg -NPO until passes SLP  # Prophylaxis: Heparin SQ # Disposition: Pending improvement in mentation and PT/OT/CSW consults # Code Status: DNR    Everrett Coombe, DO PGY-3

## 2012-10-14 NOTE — Progress Notes (Signed)
Physical Therapy Evaluation Patient Details Name: Tyler [name] MRN: [id number] DOB: [birth year] Today's Date: 10/14/2012 Time: 1536-1600 PT Time Calculation (min): 24 min  PT Assessment / Plan / Recommendation Clinical Impression  71 yo male admitted with AMS, imaging negative for intracranial abnormality; Presents with decr functional mobility; Will benefit from PT to maximize independence and safety with mobility, and to facilitate dc planning    PT Assessment  Patient needs continued PT services    Follow Up Recommendations  CIR;SNF;Supervision/Assistance - 24 hour    Does the patient have the potential to tolerate intense rehabilitation      Barriers to Discharge Decreased caregiver support Will need more info re: available assist for pt at home    Equipment Recommendations  Rolling walker with 5" wheels (3in1)    Recommendations for Other Services Rehab consult;OT consult   Frequency Min 3X/week    Precautions / Restrictions Precautions Precautions: Fall   Pertinent Vitals/Pain no apparent distress Still, Noted BP elevated: 193/92 up in chair; has been elevated most of the day      Mobility  Bed Mobility Bed Mobility: Rolling Right;Right Sidelying to Sit Rolling Right: 1: +2 Total assist;With rail Rolling Right: Patient Percentage: 50% Right Sidelying to Sit: 1: +2 Total assist;With rails Right Sidelying to Sit: Patient Percentage: 50% Details for Bed Mobility Assistance: Max tactile cueing for log roll as pt is experiencing back pain; Still participating well; +2 for safety with initially getting up Transfers Transfers: Sit to Stand;Stand to Sit Sit to Stand: 1: +2 Total assist;With upper extremity assist Sit to Stand: Patient Percentage: 60% Stand to Sit: 1: +2 Total assist;With armrests Stand to Sit: Patient Percentage: 70% Details for Transfer Assistance: Dependent on physical support/assist given at bil UEs and trunk for sit to /from stand; Still, good  participation, good weight acceptance through LEs once center of mass was shifted over feet Ambulation/Gait Ambulation/Gait Assistance: 1: +2 Total assist (for safety) Ambulation/Gait: Patient Percentage: 70% Ambulation Distance (Feet): 3 Feet (pivotal steps bed to chair) Assistive device: 2 person hand held assist Ambulation/Gait Assistance Details: Gentle cues to weight shift into stance R nd L as pt had difficulty stepping initially Modified Rankin (Stroke Patients Only) Pre-Morbid Rankin Score: No symptoms Modified Rankin: Moderately severe disability    Shoulder Instructions     Exercises     PT Diagnosis: Difficulty walking;Generalized weakness;Acute pain  PT Problem List: Decreased strength;Decreased activity tolerance;Decreased balance;Decreased mobility;Decreased coordination;Decreased cognition;Decreased knowledge of use of DME;Decreased safety awareness;Pain PT Treatment Interventions: DME instruction;Gait training;Stair training;Therapeutic activities;Functional mobility training;Therapeutic exercise;Cognitive remediation;Patient/family education   PT Goals Acute Rehab PT Goals PT Goal Formulation: Patient unable to participate in goal setting Time For Goal Achievement: 10/28/12 Potential to Achieve Goals: Fair Pt will Roll Supine to Right Side: with supervision PT Goal: Rolling Supine to Right Side - Progress: Goal set today Pt will Roll Supine to Left Side: with supervision PT Goal: Rolling Supine to Left Side - Progress: Goal set today Pt will go Supine/Side to Sit: with supervision PT Goal: Supine/Side to Sit - Progress: Goal set today Pt will go Sit to Supine/Side: with supervision PT Goal: Sit to Supine/Side - Progress: Goal set today Pt will go Sit to Stand: with supervision PT Goal: Sit to Stand - Progress: Goal set today Pt will go Stand to Sit: with supervision PT Goal: Stand to Sit - Progress: Goal set today Pt will Ambulate: 51 - 150 feet;with  supervision;with least restrictive assistive device;with rolling walker PT Goal:  Ambulate - Progress: Goal set today  Visit Information  Last PT Received On: 10/14/12 Assistance Needed: +2 (+2 will be helpful with amb, at least initially)    Subjective Data  Subjective: Apparently pt has been wanting to get OOB most of the day Patient Stated Goal: Unable to state   Prior Functioning  Home Living Lives With: Alone Available Help at Discharge: Family;Other (Comment) (Has family in town, will need more info about available assist) Type of Home: House Home Access: Stairs to enter [employer] of Steps: 3 (Needs to be verified) Entrance Stairs-Rails:  (to be determined) Home Layout: One level Home Adaptive Equipment: None Additional Comments: Will need to verify all info about home situation and family assist Prior Function Level of Independence: Independent Able to Take Stairs?: Yes Driving:  (To be determined) Comments: Inferred that pt was independent prior to admission; he reports he did not use an assistive device Communication Communication: Other (comment) (at times difficult to understand; also pleasantly confused)    Cognition  Overall Cognitive Status: Impaired Area of Impairment: Attention;Safety/judgement;Awareness of errors;Awareness of deficits;Problem solving Arousal/Alertness: Awake/alert Orientation Level: Disoriented to;Place;Time;Situation Behavior During Session: [facility] for tasks performed Current Attention Level: Sustained Safety/Judgement: Decreased safety judgement for tasks assessed;Impulsive Problem Solving: Max cues to take moving slowly for safety; many reminders for pt that he has multiple lines and leads    Extremity/Trunk Assessment Right Upper Extremity Assessment RUE ROM/Strength/Tone: [facility] for tasks assessed Left Upper Extremity Assessment LUE ROM/Strength/Tone: WFL for tasks assessed Right Lower Extremity Assessment RLE  ROM/Strength/Tone: Deficits RLE ROM/Strength/Tone Deficits: Somewhat generally weak, requiring physical assist for sit to stand Left Lower Extremity Assessment LLE ROM/Strength/Tone: Deficits LLE ROM/Strength/Tone Deficits: somewhat generally weak, requiring physical assist for sit to stand   Balance    End of Session PT - End of Session Activity Tolerance: Patient tolerated treatment well Patient left: in chair;with call bell/phone within reach;with family/visitor present;with restraints reapplied (sitter present)  GP     Olen Pel Tas[place], [place] [phone]  10/14/2012, 5:18 PM

## 2012-10-14 NOTE — Progress Notes (Signed)
Family Medicine Teaching Service Attending Note  I interviewed and examined patient Tyler [name] and reviewed their tests and x-rays.  I discussed with Dr. Ashley Royalty and reviewed their note for today.  I agree with their assessment and plan.     Additionally  Mildly agitated and cursing regularly Will attend to conversation Oriented x 3  Continued left facial droop is somewhat improved Has watery sounding voice when drinks from straw without choking Other wise neuro exam is nonfocal  Change in mental status still most consistent with medication/ alcohol withdrawal.  No signs of active infection or focal CNS changes Continue NPO until can do swallow study As needed use of Haldol or ativan for calming [place] will gradually clear

## 2012-10-14 NOTE — Evaluation (Signed)
Clinical/Bedside Swallow Evaluation Patient Details  Name: Tyler [name] MRN: [id number] Date of Birth: 03-31-1941  Today's Date: 10/14/2012 Time: 1030-1100 SLP Time Calculation (min): 30 min  Past Medical History:  Past Medical History  Diagnosis Date  . Allergic rhinitis   . BPH (benign prostatic hyperplasia)   . GERD (gastroesophageal reflux disease)   . COPD (chronic obstructive pulmonary disease)   . Shortness of breath   . Arthritis   . Enlarged prostate   . Hypertension     dr Shon Hale [place] in [place]   pcp      dr Jens Som  cardiac md  . Mental disorder   . Constipation   . Cancer Lung   Past Surgical History:  Past Surgical History  Procedure Date  . Ankle surgery 2005  . Prostate surgery 11/25/2010  . Hernia repair   . Mediastinoscopy 06/11/2012    Procedure: MEDIASTINOSCOPY;  Surgeon: Delight Ovens, MD;  Location: [facility] OR;  Service: Thoracic;  Laterality: N/A;  . Inguinal hernia repair     left  . Eye surgery     cataract right  . Video bronchoscopy with endobronchial ultrasound 10/08/2012    Procedure: VIDEO BRONCHOSCOPY WITH ENDOBRONCHIAL ULTRASOUND;  Surgeon: Leslye Peer, MD;  Location: [facility] OR;  Service: Pulmonary;  Laterality: N/A;   HPI:  71 y/o male admitted with AMS on 10/11/12 most likely to substance intoxication.  According to H&P patient on Xanax, Oxycodone, and Flexeril.  CT scan unremarkable.   Patient developed left side facial droop on 10/13/12. MRI completed on 12/14 indicates no acute stroke.    BSE warranted per stroke protocol.     Assessment / Plan / Recommendation Clinical Impression  Dysphagia indicated with +s/s of aspiration s/p swallow of trial ice chips with increase in vital signs.  No further PO trials attempted due to severity of dysphagia.  Swallow mainly impacted by weakness, decreased coordination and cognition.  Recommend NPO with oral care QID.  ST to reassess swallow for PO readiness on 10/15/12.  Completion of objective  assessment of MBS to be determined.      Aspiration Risk    Severe   Diet Recommendation NPO   Medication Administration: Via alternative means    Other  Recommendations Oral Care Recommendations: Oral care QID   Follow Up Recommendations  Inpatient Rehab    Frequency and Duration min 2x/week  2 weeks   Pertinent Vitals/Pain RR increased to high 20's s/p trials of ice chips     SLP Swallow Goals Goal #3: Patient will consume diagnostic PO trials of ice chips administered by SLP only with no outward s/s of aspiration.     Swallow Study Prior Functional Status   Lived at home with no prior history of dysphagia    General Date of Onset: 10/13/12 HPI: 71 y/o male admitted with AMS on 10/11/12 most likely to substance intoxication.  According to H&P patient on Xanax, Oxycodone, and Flexeril.  CT scan unremarkable.   Patient developed left side facial droop on 10/13/12.  BSE warranted per stroke protocol.   Type of Study: Bedside swallow evaluation Diet Prior to this Study: NPO Temperature Spikes Noted: No History of Recent Intubation: No Behavior/Cognition: Confused;Agitated;Impulsive;Distractible;Requires cueing Oral Cavity - Dentition: Adequate natural dentition;Missing dentition (missing dentition on bottom row) Self-Feeding Abilities: Total assist (wearing hand mitts ) Patient Positioning: Upright in bed Baseline Vocal Quality: Breathy;Hoarse;Low vocal intensity Volitional Cough: Cognitively unable to elicit Volitional Swallow: Unable to elicit    Oral/Motor/Sensory  Function Overall Oral Motor/Sensory Function: Impaired Labial ROM: Reduced left Labial Symmetry: Abnormal symmetry left Labial Strength: Reduced Labial Sensation: Reduced Lingual ROM: Other (Comment) (patient cognitively unable to follow directions to assess ) Facial Symmetry: Left droop Facial Strength: Reduced Facial Sensation: Reduced Mandible: Within Functional Limits   Ice Chips Ice chips:  Impaired Presentation: Spoon Oral Phase Impairments: Poor awareness of bolus Oral Phase Functional Implications: Left anterior spillage Pharyngeal Phase Impairments: Suspected delayed Swallow;Decreased hyoid-laryngeal movement;Throat Clearing - Delayed;Cough - Delayed;Change in Vital Signs;Wet Vocal Quality   Thin Liquid Thin Liquid: Not tested    Nectar Thick Nectar Thick Liquid: Not tested   Honey Thick Honey Thick Liquid: Not tested   Puree Puree: Not tested   Solid   GO    Solid: Not tested      Moreen Fowler MS, CCC-SLP [phone] [facility] 10/14/2012,12:20 PM

## 2012-10-15 LAB — COMPREHENSIVE METABOLIC PANEL
ALT: 11 U/L (ref 0–53)
Alkaline Phosphatase: 109 U/L (ref 39–117)
CO2: 20 mEq/L (ref 19–32)
Chloride: 101 mEq/L (ref 96–112)
GFR calc Af Amer: >90 mL/min (ref >90)
GFR calc non Af Amer: >90 mL/min (ref >90)
Glucose, Bld: 107 mg/dL — ABNORMAL HIGH (ref 70–99)
Potassium: 3.8 mEq/L (ref 3.5–5.1)
Sodium: 138 mEq/L (ref 135–145)

## 2012-10-15 LAB — GLUCOSE, CAPILLARY: Glucose-Capillary: 115 mg/dL — ABNORMAL HIGH (ref 70–99)

## 2012-10-15 LAB — CBC
MCHC: 34.6 g/dL (ref 30.0–36.0)
RDW: 13.2 % (ref 11.5–15.5)

## 2012-10-15 MED ORDER — NEBIVOLOL HCL 10 MG PO TABS
10.0000 mg | ORAL_TABLET | Freq: Every day | ORAL | Status: DC
  Administered 2012-10-15 – 2012-10-17 (×3): 10 mg via ORAL
  Filled 2012-10-15 (×4): qty 1

## 2012-10-15 MED ORDER — TAB-A-VITE/IRON PO TABS
1.0000 | ORAL_TABLET | Freq: Every day | ORAL | Status: DC
  Administered 2012-10-15 – 2012-10-18 (×4): 1 via ORAL
  Filled 2012-10-15 (×4): qty 1

## 2012-10-15 MED ORDER — FOLIC ACID 1 MG PO TABS
1.0000 mg | ORAL_TABLET | Freq: Every day | ORAL | Status: DC
  Administered 2012-10-15 – 2012-10-18 (×4): 1 mg via ORAL
  Filled 2012-10-15 (×4): qty 1

## 2012-10-15 MED ORDER — METOPROLOL TARTRATE 1 MG/ML IV SOLN: 5.0000 mg | Freq: Once | INTRAVENOUS | Status: DC

## 2012-10-15 NOTE — Progress Notes (Signed)
Physical Therapy Treatment Patient Details Name: Tyler [name] MRN: [id number] DOB: Jan 22, 1941 Today's Date: 10/15/2012 Time: 2440-1027 PT Time Calculation (min): 31 min  PT Assessment / Plan / Recommendation Comments on Treatment Session  Pt admitted with AMS from possible drug interaction with imaging negative. Pt with improved mentation today per dgtr present initiation of session however continues to demonstrate AMS, left lean and pt states decreased sensation LLE but difficult to discern secondary to pt stating his legs have felt like that and been checked out before unclear if acute change or chronic vascular issue. Pt progressing with mobility but limited by physical and cognitive status  and continue to recommend CIR    Follow Up Recommendations        Does the patient have the potential to tolerate intense rehabilitation     Barriers to Discharge        Equipment Recommendations       Recommendations for Other Services    Frequency     Plan Discharge plan remains appropriate;Frequency remains appropriate    Precautions / Restrictions Precautions Precautions: Fall Precaution Comments: left lean   Pertinent Vitals/Pain Chronic neck pain grossly 4/10 via pt face not face scale 99HR, 95% O2 at rest with dyspnea 2/4 after ambulation and activity    Mobility  Bed Mobility Bed Mobility: Supine to Sit;Sitting - Scoot to Edge of Bed Supine to Sit: 3: Mod assist;HOB elevated;With rails (HOB 30degrees) Sitting - Scoot to Edge of Bed: 2: Max assist Details for Bed Mobility Assistance: cueing for sequence with assist to bring legs to EOB and elevate trunk from surface with max multimodal cueing and increased time Transfers Transfers: Sit to Stand;Stand to Sit Sit to Stand: 1: +2 Total assist;From bed Sit to Stand: Patient Percentage: 70% Stand to Sit: 1: +2 Total assist;To chair/3-in-1;With armrests Stand to Sit: Patient Percentage: 50% Details for Transfer Assistance: pt  with cueing for hand placement, anterior translation and assist to maintain midline secondary to left lean. Pt with increased left lean with turning and with sitting to chair with increased assist to shift pelvis to right to achieve sitting on surface rather than armrest.  Ambulation/Gait Ambulation/Gait Assistance: 1: +2 Total assist Ambulation/Gait: Patient Percentage: 70% Ambulation Distance (Feet): 20 Feet Assistive device: Rolling walker Ambulation/Gait Assistance Details: Pt initiated gait with bil HHA but progressed to use of RW but maintains flexed posture, RW too far forward and neck flexion with max cueing to step into RW, extend trunk and achieve midline position Gait Pattern: Step-through pattern;Decreased stride length;Trunk flexed;Narrow base of support Gait velocity: decreased Stairs: No    Exercises     PT Diagnosis:    PT Problem List:   PT Treatment Interventions:     PT Goals Acute Rehab PT Goals PT Goal: Supine/Side to Sit - Progress: Progressing toward goal PT Goal: Sit to Supine/Side - Progress: Progressing toward goal Pt will go Sit to Stand: with min assist PT Goal: Sit to Stand - Progress: Revised due to lack of progress Pt will go Stand to Sit: with min assist PT Goal: Stand to Sit - Progress: Revised due to lack of progress Pt will Ambulate: 16 - 50 feet;with mod assist;with least restrictive assistive device PT Goal: Ambulate - Progress: Revised due to lack of progress  Visit Information  Last PT Received On: 10/15/12 Assistance Needed: +2 PT/OT Co-Evaluation/Treatment: Yes    Subjective Data  Subjective: "I was out walking in the hall with my wife yesterday" Patient Stated Goal:  return home   Cognition  Overall Cognitive Status: Impaired Area of Impairment: Memory;Attention;Safety/judgement;Problem solving Arousal/Alertness: Awake/alert Orientation Level: Disoriented to;Situation Behavior During Session: WFL for tasks performed Current Attention  Level: Sustained Memory Deficits: Pt with difficulty recalling children and grandchildren names and who were children vs grandchildren. Could decipher through pt story but pt unable to recognize lack of clarity and correctness of statements Safety/Judgement: Decreased safety judgement for tasks assessed;Decreased awareness of need for assistance Safety/Judgement - Other Comments: Pt with left lean throughout tx and although aware of lean pt unable to correct without cueing Problem Solving: Pt unable to recognize with safety that he was going to sit on armrest    Balance  Static Sitting Balance Static Sitting - Balance Support: Left upper extremity supported;Feet supported Static Sitting - Level of Assistance: 4: Min assist Static Sitting - Comment/# of Minutes: 3 min EOB with left lean and corrects only with cueing Static Standing Balance Static Standing - Balance Support: Bilateral upper extremity supported Static Standing - Level of Assistance: 3: Mod assist Static Standing - Comment/# of Minutes: 2  End of Session PT - End of Session Equipment Utilized During Treatment: Gait belt Activity Tolerance: Patient tolerated treatment well Patient left: in chair;with call bell/phone within reach;with chair alarm set Nurse Communication: Mobility status        Delorse Lek 10/15/2012, 4:53 PM Delaney Meigs, PT [phone]

## 2012-10-15 NOTE — Progress Notes (Signed)
FMTS Attending Daily Note:  Tyler Don MD  [phone] pager  Family Practice pager:  [phone] I have seen and examined this patient and have reviewed their chart. I have discussed this patient with the resident. I agree with the resident's findings, assessment and care plan.  This is my first time meeting the patient.  Based on previous notes, it appears he is slowly improving.  Ok to transfer to floor today.

## 2012-10-15 NOTE — Evaluation (Signed)
Occupational Therapy Evaluation Patient Details Name: Tyler [name] MRN: [id number] DOB: January 05, 1941 Today's Date: 10/15/2012 Time: 4098-1191 OT Time Calculation (min): 31 min  OT Assessment / Plan / Recommendation Clinical Impression  This 71 y.o. male admitted with AMS.  CT and MRI with no acute changes.  Pt. endorses intermittent diplopia and demonstrates dysconjugate gaze; however, unable to participate in full visual assesment due to cogntive deficits.  Pt. with dysmmetria Lt UE > Rt. UE and leans heavily to Lt. in sitting and standing.  Pt. will benefit from OT for the below listed deficits to allow him to return to min A level after post acute rehab    OT Assessment  Patient needs continued OT Services    Follow Up Recommendations  CIR;Supervision/Assistance - 24 hour    Barriers to Discharge Decreased caregiver support    Equipment Recommendations  None recommended by OT    Recommendations for Other Services Rehab consult  Frequency  Min 3X/week    Precautions / Restrictions Precautions Precautions: Fall Precaution Comments: left lean       ADL  Eating/Feeding: Simulated;Minimal assistance Where Assessed - Eating/Feeding: Chair Grooming: Wash/dry hands;Wash/dry face;Teeth care;Minimal assistance Where Assessed - Grooming: Supported sitting Upper Body Bathing: Moderate assistance Where Assessed - Upper Body Bathing: Supported sitting Lower Body Bathing: +2 Total assistance Lower Body Bathing: Patient Percentage: 50% Upper Body Dressing: Moderate assistance Where Assessed - Upper Body Dressing: Unsupported sitting Lower Body Dressing: +2 Total assistance Lower Body Dressing: Patient Percentage: 40% Where Assessed - Lower Body Dressing: Supported sit to stand Toilet Transfer: +2 Total assistance Toilet Transfer: Patient Percentage: 60% Toilet Transfer Method: Sit to stand Toileting - Clothing Manipulation and Hygiene: +1 Total assistance Where Assessed -  Glass blower/designer Manipulation and Hygiene: Standing Equipment Used: Gait belt;Rolling walker Transfers/Ambulation Related to ADLs: Requires total A +2 (pt 70%).  Pt. with lean to left. Required total A + 2 (pt 60% ) to sit in recliner as pt. leaning heavily to Lt  ADL Comments: Pt. able to don/doff socks with min A sitting in chair.  Pt. with heavy lean to Lt.     OT Diagnosis: Generalized weakness;Cognitive deficits;Disturbance of vision;Ataxia  OT Problem List: Decreased strength;Decreased activity tolerance;Impaired balance (sitting and/or standing);Impaired vision/perception;Decreased coordination;Decreased cognition;Decreased safety awareness;Decreased knowledge of use of DME or AE;Impaired UE functional use OT Treatment Interventions: Self-care/ADL training;Neuromuscular education;DME and/or AE instruction;Therapeutic activities;Cognitive remediation/compensation;Visual/perceptual remediation/compensation;Patient/family education;Balance training   OT Goals Acute Rehab OT Goals OT Goal Formulation: With patient Time For Goal Achievement: 10/29/12 Potential to Achieve Goals: Good ADL Goals Pt Will Perform Grooming: with min assist;Standing at sink ADL Goal: Grooming - Progress: Goal set today Pt Will Perform Upper Body Bathing: with supervision;Sitting, chair ADL Goal: Upper Body Bathing - Progress: Goal set today Pt Will Perform Lower Body Bathing: with min assist;Sit to stand from chair;Sit to stand from bed ADL Goal: Lower Body Bathing - Progress: Goal set today Pt Will Perform Upper Body Dressing: with supervision;Sitting, chair;Sitting, bed ADL Goal: Upper Body Dressing - Progress: Goal set today Pt Will Perform Lower Body Dressing: with min assist;Sit to stand from chair;Sit to stand from bed ADL Goal: Lower Body Dressing - Progress: Goal set today Pt Will Transfer to Toilet: with min assist;Ambulation;Comfort height toilet ADL Goal: Toilet Transfer - Progress: Goal set  today Additional ADL Goal #1: Pt. will be oriented x 4 with use of external cues as necessary ADL Goal: Additional Goal #1 - Progress: Goal set today Additional ADL Goal #  2: Pt will demonstrate selective attention during self care tasks with no cues ADL Goal: Additional Goal #2 - Progress: Goal set today  Visit Information  Last OT Received On: 10/15/12 Assistance Needed: +2 PT/OT Co-Evaluation/Treatment: Yes    Subjective Data  Subjective: "I am at the [facility]" Patient Stated Goal: To regain strength   Prior Functioning     Home Living Lives With: Alone Available Help at Discharge: Family;Skilled Nursing Facility Type of Home: House Home Access: Stairs to enter Secretary/administrator of Steps: 3 Home Layout: One level Home Adaptive Equipment: Walker - rolling Additional Comments: Dtr present at beginning of session and verified PLOF and that pt. will be going to rehab post discharge Prior Function Level of Independence: Independent Able to Take Stairs?: Yes Driving: Yes Vocation: Retired Comments: Dtr reports that pt. began using RW ~1 week PTA Communication Communication: No difficulties Dominant Hand: Left (Pt. reports he is ambidexterous)         Vision/Perception Vision - Assessment Eye Alignment: Impaired (comment) Vision Assessment: Vision tested Additional Comments: Unable to accurately asses due to attentional deficits.  Pt. endorses intermittent diplopa and does demonstrate dysconjugate gaze.  He is able to read clock correctly.  Has been experiencing visual hallucinations.   Cognition  Overall Cognitive Status: Impaired Area of Impairment: Memory;Attention;Safety/judgement;Problem solving Arousal/Alertness: Awake/alert Orientation Level: Disoriented to;Situation Behavior During Session: WFL for tasks performed Current Attention Level: Sustained Memory Deficits: Pt with difficulty recalling children and grandchildren names  and who were children vs grandchildren. Could decipher through pt story but pt unable to recognize lack of clarity and correctness of statements Following Commands: Follows one step commands consistently Safety/Judgement: Decreased safety judgement for tasks assessed;Decreased awareness of need for assistance Safety/Judgement - Other Comments: Pt with left lean throughout tx and although aware of lean pt unable to correct without cueing Awareness of Errors: Assistance required to identify errors made Problem Solving: Pt unable to recognize with safety that he was going to sit on armrest Cognition - Other Comments: Pt. with confusion, and hallucinating at times during eval    Extremity/Trunk Assessment Right Upper Extremity Assessment RUE ROM/Strength/Tone: [facility] for tasks assessed RUE Coordination: Deficits RUE Coordination Deficits: tremor noted; mild dysmmetria Left Upper Extremity Assessment LUE ROM/Strength/Tone: WFL for tasks assessed LUE Coordination: Deficits LUE Coordination Deficits: Dysmmetria noted Trunk Assessment Trunk Assessment: Other exceptions Trunk Exceptions: pt with lumbar and thoracic flexion with Lt. lateral flexion.  head/neck in capital extension     Mobility Bed Mobility Bed Mobility: Supine to Sit;Sitting - Scoot to Edge of Bed Rolling Left: 3: Mod assist;With rail Left Sidelying to Sit: 3: Mod assist Supine to Sit: 3: Mod assist;HOB elevated;With rails (HOB 30degrees) Sitting - Scoot to Edge of Bed: 2: Max assist Details for Bed Mobility Assistance: cueing for sequence with assist to bring legs to EOB and elevate trunk from surface with max multimodal cueing and increased time Transfers Transfers: Sit to Stand;Stand to Sit Sit to Stand: 1: +2 Total assist;From bed Sit to Stand: Patient Percentage: 70% Stand to Sit: 1: +2 Total assist;To chair/3-in-1;With armrests Stand to Sit: Patient Percentage: 50% Details for Transfer Assistance: pt with cueing for hand  placement, anterior translation and assist to maintain midline secondary to left lean. Pt with increased left lean with turning and with sitting to chair with increased assist to shift pelvis to right to achieve sitting on surface rather than armrest.      Shoulder Instructions  Exercise     Balance Balance Balance Assessed: Yes Static Sitting Balance Static Sitting - Balance Support: Left upper extremity supported;Feet supported Static Sitting - Level of Assistance: 4: Min assist Static Sitting - Comment/# of Minutes: 3 min EOB with left lean and corrects only with cueing Static Standing Balance Static Standing - Balance Support: Bilateral upper extremity supported Static Standing - Level of Assistance: 3: Mod assist Static Standing - Comment/# of Minutes: 2   End of Session OT - End of Session Equipment Utilized During Treatment: Gait belt Activity Tolerance: Patient limited by fatigue Patient left: in chair;with call bell/phone within reach;with family/visitor present;with nursing in room Nurse Communication: Mobility status  GO     Daivon Rayos M 10/15/2012, 5:03 PM

## 2012-10-15 NOTE — Progress Notes (Signed)
Patient ID: Tyler [name], male   DOB: 09-Dec-1940, 71 y.o.   MRN: [id number]  Family Medicine Teaching Service Daily Progress Note Service Page: [phone]   Subjective: Pt's daughter present and says he seems much better today. He does complain of some neck pain. Also mentions that he sees bugs crawling on the wall, but fewer than yesterday.  Objective: Temp:  [98 F (36.7 C)-99.3 F (37.4 C)] 98 F (36.7 C) (12/16 0741) Pulse Rate:  [83-111] 111  (12/16 0741) Resp:  [19-28] 23  (12/16 0741) BP: (168-194)/(65-103) 189/94 mmHg (12/16 0741) SpO2:  [94 %-98 %] 98 % (12/16 0741) Exam: General: less disheveled appearing than previous, cooperative with exam HEENT: normocephalic Cardiovascular: regular rhythm, slightly tachycardic Respiratory: normal respiratory effort, some diminished sounds in bilateral anterolateral bases Abdomen: soft, nontender, nondistended Extremities: lower extremities nontender to palpation Neuro:Oriented to person, place and year, not month. Speech less slurred than previously. Still with some slight left facial droop, improved from Saturday 12/14. Grip 5/5 bilaterally. Hip flexion 5/5 bilaterally.  I have reviewed the patient's medications, labs, imaging, and diagnostic testing.  Notable results are summarized below.  Medications:  Scheduled Meds: Azithromycin x1 in ED Ceftriaxone x1 in ED Heparin 5000u TID Protonix 40mg  IV daily Thiamine 100mg  daily  PRN Meds: Tylenol 650 q6h prn Albuterol 2.5 mg neb q2h prn Ativan 1mg  q6h prn CIWA protocol Metoprolol 5mg  BID prn high BP Hydralazine 10mg  q6h prn high BP  IVF: none  Labs:  CBC  Lab 10/15/12 0625 10/13/12 0454 10/12/12 2115  WBC 7.9 10.2 10.5  HGB 14.0 12.9* 12.8*  HCT 40.5 36.6* 37.5*  PLT 198 180 182    BMET  Lab 10/15/12 0625 10/13/12 0454 10/12/12 2115 10/12/12 1305  NA 138 136 -- 134*  K 3.8 3.5 -- 3.6  CL 101 100 -- 96  CO2 20 24 -- 28  BUN 10 9 -- 11  CREATININE 0.66 0.65  0.67 --  GLUCOSE 107* 122* -- 132*  CALCIUM 10.0 9.6 -- 9.5    Imaging/Diagnostic Tests: CT Cervical Spine 12/13: Cervical degenerative changes. Negative for fracture.   CT Head 12/13: No acute abnormality.  CXR 12/13: Degraded by hypoaeration, body habitus, and portable technique. Bibasilar opacities; atelectasis versus pneumonia. Consider PA and lateral follow-up.  [name] No acute stroke is evident.  There is mild atrophy with chronic microvascular ischemic change.  Correlate clinically with regard to the left facial weakness.   Assessment/Plan: TYLAR AMBORN is a 71 y.o. year old male presenting with altered mental status for 5 days. Differential diagnosis includes substance intoxication, CVA, meningitis. Most likely secondary to substance intoxication as he has many mental altering drugs onboard with increased dosing including Xanax 1 mg tid, Oxycodone 10 mg q 4 hrs, Flexeril 10 mg tid. Low Likelihood this could be TIA/CVA as patient has had 5 days of this, no focal neurologic weakness, no facial droop, and CT scan does not show acute changes.  Low Likelihood as well that this may be meningitis. No WBC, no fever, no focal neurologic deficits. Is c/o some neck stiffness, however, he does have baseline neck and back pain and per daughter this has not changed over the last several days.   # Acute Delirium - most likely secondary to new medications -Pt appears much improved this morning, less agitated than upon admission -LFT's normal along with PT/INR (suggest normal clearance of medications) -Salicylate Level, Acetaminophen Level, EtOH levels all negative -Telemetry, vitals and neuro checks  q shift.  -Has passed swallow study this morning so will order dysphagia 3 diet and restart PO meds (MVI, folate) -Consider LP if pt starts to have focal neurologic deficits along with fever/leukocytosis.   # Neck Pain -Pt with chronic neck pain at home, and taking chronic  narcotics -Will give morphine 2mg  q4hprn   # Prolonged QTc -EKG this morning shows QTc of 487, up from 454 yesterday -Will avoid further doses of medications that prolong QTc (including Haldol)  # COPD - Pt has signs of hyperinflation on CXR, with bibasilar opacities but film is of poor quality -O2 saturations at 96% on RA, monitor closely.  -continuous pulse ox while altered mental status -Previous notes state pt is on Symbicort for his COPD. Not on his home meds list and daughter unsure. Will only order Albuterol at this time PRN for SOB.  -s/p dose of ceftriaxone and azithromycin for CAP given CXR findings -No WBC, afebrile, no hypoxia, thus antibiotics discontinued at admission  # Lung Mass - Being followed by Dr. Delton Coombes, pulmonology  -Tissue biopsy has been inconclusive to date -CT and MRI head does not show mets to the brain.   # HTN - Pt recently taken off his BP medication (Bystolic due to it's B1 selectivity) around 2 months ago -BP's persistently high overnight -Will restart home bystolic dose for better BP control  # Chronic Alcohol Use - Unsure of the extent of this. -LFT's and INR normal, suggest normal liver functioning -CIWA protocol as pt may be going through withdrawal  -CSW for alcohol counseling   # FEN/GI:  -Protonix IV 40 mg -dysphagia 3 diet  # Prophylaxis: Heparin SQ  # Disposition:  -transfer to floor with tele today -sitter at bedside -Pending improvement in mentation and PT/OT/CSW consults  # Code Status: DNR   Levert Feinstein, MD Family Medicine PGY-1

## 2012-10-15 NOTE — Progress Notes (Signed)
Clinical Social Worker left voicemail message with Revonda Standard at [facility] to express family's interest in [facility].    Angelia Mould, MSW, [place] [phone]

## 2012-10-15 NOTE — Progress Notes (Signed)
Rehab Admissions Coordinator Note:  Patient was screened by Trish Mage for appropriateness for an Inpatient Acute Rehab Consult.  At this time, we are recommending Inpatient Rehab consult.  Noted PT and ST recommending inpatient rehab.  Lelon Frohlich M 10/15/2012, 9:04 AM  I can be reached at [phone].

## 2012-10-15 NOTE — Progress Notes (Signed)
Speech Language Pathology Dysphagia Treatment Patient Details Name: Tyler [name] MRN: [id number] DOB: 01-02-1941 Today's Date: 10/15/2012 Time: 4098-1191 SLP Time Calculation (min): 19 min  Assessment / Plan / Recommendation Clinical Impression  Pt. followed up today for safety to initiate diet/liquids.  Pt.'s daughter report he "is much better today that yesterday."  He was alert and cooperative and requiring mild verbal cues to follow directions.  Slightly slower oral prep, mastication and transit with cracker.  Pharyngeal phase with suspected and intermittent delayed swallow initiation.  No wet vocal quality, cough or throat clear present.  Pt. with slight increased work of breathing after expectoration and oral suctioning of mucous.  Aspiration risk remains, however appears low to moderate.  Recommend a Dys 3 diet and thin liquids, no straws and pills whole in applesauce.  ST will continue to follow for diet texture upgrade and consistency of swallow precautions.    Diet Recommendation  Continue with Current Diet: Dysphagia 3 (mechanical soft);Thin liquid    SLP Plan Continue with current plan of care       Swallowing Goals  SLP Swallowing Goals Patient will consume recommended diet without observed clinical signs of aspiration with: Minimal assistance Patient will utilize recommended strategies during swallow to increase swallowing safety with: Minimal assistance  General Temperature Spikes Noted: No Respiratory Status: Room air Behavior/Cognition: Alert;Cooperative;Requires cueing Oral Cavity - Dentition: Adequate natural dentition Patient Positioning: Upright in bed  Oral Cavity - Oral Hygiene Does patient have any of the following "at risk" factors?: Saliva - thick, dry mouth;Nutritional status - inadequate Brush patient's teeth BID with toothbrush (using toothpaste with fluoride): Yes Patient is AT RISK - Oral Care Protocol followed (see row info): Yes   Dysphagia  Treatment Treatment focused on: Upgraded PO texture trials;Facilitation of oral preparatory phase;Facilitation of oral phase;Facilitation of pharyngeal phase Treatment Methods/Modalities: Differential diagnosis Patient observed directly with PO's: Yes Type of PO's observed: Regular;Thin liquids;Dysphagia 1 (puree) Feeding: Able to feed self;Needs set up Liquids provided via: Cup;No straw Oral Phase Signs & Symptoms: Prolonged bolus formation Pharyngeal Phase Signs & Symptoms: Suspected delayed swallow initiation Type of cueing: Verbal Amount of cueing: Minimal   GO     Royce Macadamia M.Ed [employer] [phone]  10/15/2012

## 2012-10-15 NOTE — Clinical Social Work Psychosocial (Signed)
     Clinical Social Work Department BRIEF PSYCHOSOCIAL ASSESSMENT 10/15/2012  Patient:  Tyler [name], Tyler [name]     Account Number:  [id number]     Admit date:  10/12/2012  Clinical Social Worker:  Margaree Mackintosh  Date/Time:  10/15/2012 10:24 AM  Referred by:  Physician  Date Referred:  10/15/2012 Referred for  SNF Placement   Other Referral:   Interview type:  Patient Other interview type:   With dtr-[name] Darl Pikes present.    PSYCHOSOCIAL DATA Living Status:  ALONE Admitted from facility:   Level of care:   Primary support name:  Arnetha Gula: [phone] Primary support relationship to patient:  CHILD, ADULT Degree of support available:   Adequate.  Pt currently appears confused.    CURRENT CONCERNS Current Concerns  Post-Acute Placement   Other Concerns:    SOCIAL WORK ASSESSMENT / PLAN Clinical Social Worker recieved referral for potential SNF placement.  CSW reviewed chart and met with pt, pt's dtr, and nurse tech at bedside.  CSW introduced self, explained role, and provided support.  CSW reviewed recommendations for SNF at dc to assist with increasing pt's strength and mobility; dtr and pt agreeable.  Dtr and pt agreeable to SNF search in [facility], with preference to [facility] available.  CSW to begin SNF search.   Assessment/plan status:  Information/Referral to [facility] Other assessment/ plan:   Information/referral to community resources:   SNF.    PATIENTS/FAMILYS RESPONSE TO PLAN OF CARE: Though pleasant and engaged, Pt currently appears somewhat confused.  Dtr was pleasant and engaged in conversation. Pt and dtr thanked CSW for intervention.

## 2012-10-15 NOTE — Clinical Social Work Placement (Addendum)
    Clinical Social Work Department CLINICAL SOCIAL WORK PLACEMENT NOTE 10/15/2012  Patient:  SAIGE, BUSBY  Account Number:  [id number] Admit date:  10/12/2012  Clinical Social Worker:  Margaree Mackintosh  Date/time:  10/15/2012 10:56 AM  Clinical Social Work is seeking post-discharge placement for this patient at the following level of care:   SKILLED NURSING   (*CSW will update this form in Epic as items are completed)   10/15/2012  Patient/family provided with [facility] Department of Clinical Social Work's list of facilities offering this level of care within the geographic area requested by the patient (or if unable, by the patient's family).  10/15/2012  Patient/family informed of their freedom to choose among providers that offer the needed level of care, that participate in Medicare, Medicaid or managed care program needed by the patient, have an available bed and are willing to accept the patient.  10/15/2012  Patient/family informed of [facility]' ownership interest in [facility], as well as of the fact that they are under no obligation to receive care at this facility.  PASARR submitted to EDS on 10/15/2012 PASARR number received from EDS on 10/17/12: [id number]  FL2 transmitted to all facilities in geographic area requested by pt/family on  10/15/2012 FL2 transmitted to all facilities within larger geographic area on   Patient informed that his/her managed care company has contracts with or will negotiate with  certain facilities, including the following:     Patient/family informed of bed offers received: 10/16/12  Patient chooses bed at [facility], [place] Physician recommends and patient chooses bed at    Patient to be transferred to [facility] on 10/18/12   Patient to be transferred to facility by ambulance  The following physician request were entered in Epic:   Additional Comments: 10/16/12 - CSW called [facility]  [place] and spoke with Jill Side in admissions and they are unable to offer patient a bed. CSW contacted Ms. Darl Pikes [phone]) and gave bed offers. She requested initially that CSW send patient's information to [facility] in [place] as she does not live far from that city. After talking further, Ms. Darl Pikes requested that CSW send information to all SNF's in [facility]. Genelle Bal, LCSW) 10/17/12 - CSW talked by phone with patient's daughter and gave bed offers for [facility] and reviewed bed offers for [facility]. Her preferences are: [facility] and [facility]. Ms. Darl Pikes also wants CSW to call [facility] in [place] to determine if they can offer a bed. Daughter advised that patient is ready for discharge Thursday, 12/19 as [facility] cannot take patient.  Genelle Bal, LCSW)  10/18/12 -  Patient's daughter contacted [facility] and after clarification of patient's behaviors, a bed offer was extended.

## 2012-10-16 MED ORDER — PANTOPRAZOLE SODIUM 40 MG PO TBEC
40.0000 mg | DELAYED_RELEASE_TABLET | Freq: Every day | ORAL | Status: DC
  Administered 2012-10-16 – 2012-10-18 (×3): 40 mg via ORAL
  Filled 2012-10-16 (×3): qty 1

## 2012-10-16 MED ORDER — NEBIVOLOL HCL 10 MG PO TABS: 10.0000 mg | ORAL_TABLET | Freq: Every day | ORAL | Status: DC

## 2012-10-16 NOTE — Progress Notes (Deleted)
Physical Therapy Evaluation Patient Details Name: Tyler [name] MRN: [id number] DOB: 1940-12-13 Today's Date: 10/16/2012 Time: 7829-5621 PT Time Calculation (min): 24 min  PT Assessment / Plan / Recommendation Clinical Impression       PT Assessment       Follow Up Recommendations  CIR    Does the patient have the potential to tolerate intense rehabilitation      Barriers to Discharge        Equipment Recommendations  Rolling walker with 5" wheels    Recommendations for Other Services Rehab consult   Frequency Min 3X/week    Precautions / Restrictions Precautions Precautions: Fall Restrictions Weight Bearing Restrictions: No   Pertinent Vitals/Pain no apparent distress       Mobility  Bed Mobility Bed Mobility: Not assessed (presented to PT sitting EOB) Transfers Transfers: Sit to Stand;Stand to Sit Sit to Stand: 3: Mod assist;From chair/3-in-1;With upper extremity assist;With armrests Stand to Sit: 3: Mod assist;To chair/3-in-1;With upper extremity assist Details for Transfer Assistance: Cues for safety and hand placement; Noted decr control of descent with stand to sit Ambulation/Gait Ambulation/Gait Assistance: 1: +2 Total assist Ambulation/Gait: Patient Percentage: 70% Ambulation Distance (Feet): 120 Feet Assistive device: Rolling walker Ambulation/Gait Assistance Details: Cues for RW proximity, tall posture, and to push RW smoothly without picking it up; Noted tending to lean right, also with tendency for RW to list to Right (this is in contrast to his left lean last session); Second person for safety and to push chair behind Gait Pattern: Step-through pattern;Decreased stride length;Trunk flexed;Narrow base of support Gait velocity: decreased    Shoulder Instructions     Exercises     PT Diagnosis:    PT Problem List:   PT Treatment Interventions:     PT Goals Acute Rehab PT Goals Time For Goal Achievement: 10/28/12 Potential to Achieve  Goals: Fair Pt will go Sit to Stand: with min assist PT Goal: Sit to Stand - Progress: Progressing toward goal Pt will go Stand to Sit: with min assist PT Goal: Stand to Sit - Progress: Progressing toward goal Pt will Ambulate: 16 - 50 feet;with mod assist;with least restrictive assistive device PT Goal: Ambulate - Progress: Met (Will plan to update amb goal next session)  Visit Information  Last PT Received On: 10/16/12 Assistance Needed: +2    Subjective Data  Subjective: Agreeable to amb Patient Stated Goal: return home   Prior Functioning       Cognition  Overall Cognitive Status: Impaired Area of Impairment: Memory;Attention;Safety/judgement;Problem solving Arousal/Alertness: Awake/alert Orientation Level: Appears intact for tasks assessed Behavior During Session: [facility] for tasks performed Current Attention Level: Sustained Following Commands: Follows one step commands consistently Safety/Judgement: Decreased safety judgement for tasks assessed;Decreased awareness of need for assistance    Extremity/Trunk Assessment     Balance    End of Session PT - End of Session Equipment Utilized During Treatment: Gait belt Activity Tolerance: Patient tolerated treatment well Patient left: in chair;with call bell/phone within reach;with chair alarm set Nurse Communication: Mobility status  GP     Olen Pel Lamar, [place] [phone] 10/16/2012, 10:59 AM

## 2012-10-16 NOTE — Progress Notes (Signed)
Patient ID: Tyler [name], male   DOB: [birth year], 71 y.o.   MRN: [id number] [facility] Medicine Teaching Service PGY-1 Progress Note   Overnight Events: Patient reports he is doing well. He is up in the bedside chair and is rather talkative this morning. Interacting well with nursing staff. He is in good spirits. He voices concerns on why his PCP had prescribed him so much medication. He understands he is in the hospital for confusion and he reports he is doing much better. He has been able to tolerate his dysphagia 3 diet well. He asked when he will be able to have "a day off from here." He wants to go home and put up a xmas tree.  Objective: Temp:  [98 F (36.7 C)-99.1 F (37.3 C)] 98.2 F (36.8 C) (12/17 0533) Pulse Rate:  [89-99] 89  (12/17 0533) Cardiac Rhythm:  [-] Normal sinus rhythm (12/16 1952) Resp:  [18-26] 18  (12/17 0533) BP: (135-185)/(76-102) 183/94 mmHg (12/17 0533) SpO2:  [94 %-98 %] 96 % (12/17 0533) Weight:  [215 lb 3.2 oz (97.614 kg)] 215 lb 3.2 oz (97.614 kg) (12/17 0533) Weight change:  BP 152/89  Pulse 85  Temp 98.1 F (36.7 C) (Oral)  Resp 18  Ht 6\' 2"  (1.88 m)  Wt 215 lb 3.2 oz (97.614 kg)  BMI 27.63 kg/m2  SpO2 98%  Physical Exam: Gen: NAD. Cooperative and pleasant. Even humorous at times.   CV: RRR. No murmur. Lungs: CTAB, with exception of LLL crackles are present.  Abd: Soft. NT.ND. No HSM EXT: NT. No erythema. No edema.  Neuro:Oriented to person, place and year, month and season.    Assessment and Plan: EMETT STAPEL is a 71 y.o. year old male presenting with altered mental status for 5 days.  Most likely secondary to substance intoxication as he has many mental altering drugs prescribed with increased dosing including Xanax 1 mg tid, Oxycodone 10 mg q 4 hrs, Flexeril 10 mg tid.  Acute Delirium - most likely secondary to new medications  -Pt appears well this morning -LFT's normal along with PT/INR (suggest normal clearance of medications)   -Salicylate Level, Acetaminophen Level, EtOH levels all negative  -Telemetry, vitals and neuro checks q shift.   Neck Pain  -Pt with chronic neck pain at home, and taking chronic narcotics  -Will give morphine 2mg  q4hprn   Prolonged QTc  -EKG's x2 resulting in Prolonged QTc  COPD - Pt has signs of hyperinflation on CXR, with bibasilar opacities but film is of poor quality  -O2 saturations at 98% on RA, monitor closely.  -continuous pulse ox while altered mental status  -Previous notes state pt is on Symbicort for his COPD. Not on his home meds list and daughter unsure. Will only order Albuterol at this time PRN for SOB.  -s/p dose of ceftriaxone and azithromycin for CAP given CXR findings  -No WBC, afebrile, no hypoxia, thus antibiotics discontinued at admission  Lung Mass - Being followed by Dr. Delton Coombes, pulmonology  -Tissue biopsy has been inconclusive to date  -CT and MRI head does not show mets to the brain.  HTN - Pt recently taken off his BP medication (Bystolic due to it's B1 selectivity) around 2 months ago  -BP's persistently high overnight  -Will restart home bystolic dose for better BP control  Chronic Alcohol Use - Unsure of the extent of this.  -LFT's and INR normal, suggest normal liver functioning  -CIWA protocol as pt may be going through withdrawal  -  CSW for alcohol counseling  FEN/GI:  -Protonix IV 40 mg  -dysphagia 3 diet  Prophylaxis: Heparin SQ  Disposition:  -Pending improvement in mentation and PT/OT/CSW consults, CIR transfer as soon as possible. Medically stable for discharge. Code Status: DNR

## 2012-10-16 NOTE — Discharge Summary (Signed)
Family Medicine Teaching [facility] Discharge Summary  Patient name: Tyler [name] Medical record number: [id number] Date of birth: Jul 12, 1941 Age: 71 y.o. Gender: male Date of Admission: 10/12/2012  Date of Discharge: 10/18/12 Admitting Physician: Carney Living, MD  Primary Care Provider: Aida Puffer, MD  Indication for Hospitalization: Altered mental status  Discharge Diagnoses:  1. Acute delirium likely secondary to increase in medications (xanax, oxycodone, flexeril) 2. Hypertension 3. Chronic neck pain 4. Prolonged QTc 5. COPD 6. Lung Mass 7. Chronic Alcohol Use  Brief Hospital Course:  [name] is a 71 y.o. male with a history of alcohol abuse who was admitted to the hospital after presenting to the ED with acutely altered mental status for five days, after a recent increase in several of his medications (xanax, oxycodone, and flexeril).   # Altered mental status: Initial imaging of his head included a CT without contrast, which was negative for an acute bleed. Interestingly, a urine drug screen was positive only for benzodiazepines upon admission. Pt was admitted to the stepdown unit for close monitoring, and was placed on CIWA protocol for monitoring of alcohol withdrawal. For the first two days after admission, patient was intermittently agitated and occasionally combative, requiring the administration of Haldol, benadryl, and Ativan. He was kept with a 1:1 sitter for safety and reorientation. We considered CNS infection as a cause of pt's AMS, but he remained afebrile and did not have a leukocytosis on admission. On the day after admission, a new left facial droop was noted and a STAT MRI of the brain was obtained, which did not show an acute infarct. By 12/16, his mental status had much improved, and he was more cooperative, more oriented, and less combative, although he was not entirely back to his baseline level of mentation. The left facial droop  gradually improved. Physical, occupational, and speech therapy all saw and evaluated the patient, and recommended Inpatient Rehabilitation upon discharge. [facility] Inpatient Rehab team was consulted and evaluated patient, but they did not feel pt was a good candidate for inpatient rehabilitation, so placement at a SNF was sought.  # Hypertension: Initially we allowed some permissive hypertension out of concern for possible acute infarct, but once MRI brain did not show an acute CVA and patient had continued elevated blood pressures, we restarted patient's previously discontinued Bystolic 10mg . He continued to have some mildly elevated blood pressures so we increased the dose to 20mg .This medication had been discontinued as an outpatient prior to admission, so it will need titration up or down as needed based on future outpatient blood pressures.  # Prolonged QTc: EKG upon admission showed a QTc of 454, and one the morning after admission showed a QTc of 487. Would recommend monitoring of QTc in the future, especially if administering medications known to prolong QTc.  # Chronic neck pain: Prior to admission, pt was taking oxycodone 10mg  q4h and flexeril 10mg  TID for neck pain, and this recent medication increase could have contributed to his altered mental status. We held these medications initially. Pt received prn morphine while here in the hospital, with hopes that adequately treating his pain would reduce his agitation. He did not require any for several days prior to discharge, so we did not continue his narcotics upon d/c.  # COPD: Patient had some bibasilar opacities on CXR and did receive a dose of ceftriaxone and azithromycin while in the ED, but these were not continued after admission has he did not have an oxygen requirement,  fever, or leukocytosis to suggest an acute pneumonia. Albuterol prn was ordered. It was unclear to [place] as to whether this patient takes symbicort as an outpatient, as there  were conflicting reports in prior notes and from the patient's daughter, so this was held on admission.   # Alcohol Use: Patient was kept on CIWA protocol during this admission and did require some doses of Ativan per this protocol. Initial LFTs and an INR (from 10/08/12) were normal, suggesting normal liver function.  Consultations: PT, OT, SLP, Inpatient Rehab  Procedures: none  Significant Labs and Imaging:  Urine culture: no growth final Urine drug screen: + for benzodiazepines Ammonia 19 (normal) Ethanol level <11 Salicylate level <2 Acetaminophen level <15 Troponin I negative x1  CBC:    Component Value Date/Time   WBC 7.9 10/15/2012 0625   HGB 14.0 10/15/2012 0625   HCT 40.5 10/15/2012 0625   PLT 198 10/15/2012 0625   MCV 90.6 10/15/2012 0625   NEUTROABS 5.6 10/12/2012 1305   LYMPHSABS 1.5 10/12/2012 1305   MONOABS 0.9 10/12/2012 1305   EOSABS 0.1 10/12/2012 1305   BASOSABS 0.0 10/12/2012 1305   Comprehensive Metabolic Panel:    Component Value Date/Time   NA 138 10/15/2012 0625   K 3.8 10/15/2012 0625   CL 101 10/15/2012 0625   CO2 20 10/15/2012 0625   BUN 10 10/15/2012 0625   CREATININE 0.66 10/15/2012 0625   GLUCOSE 107* 10/15/2012 0625   CALCIUM 10.0 10/15/2012 0625   AST 21 10/15/2012 0625   ALT 11 10/15/2012 0625   ALKPHOS 109 10/15/2012 0625   BILITOT 0.5 10/15/2012 0625   PROT 7.6 10/15/2012 0625   ALBUMIN 3.3* 10/15/2012 0625    CT Cervical Spine 12/13:  Cervical degenerative changes. Negative for fracture.   CT Head 12/13:  No acute abnormality.   CXR 12/13:  Degraded by hypoaeration, body habitus, and portable technique. Bibasilar opacities; atelectasis versus pneumonia. Consider PA and lateral follow-up.   [name] 12/14: No acute stroke is evident. There is mild atrophy with chronic microvascular ischemic change. Correlate clinically with regard to the left facial weakness.    Discharge Medications:    Medication List      As of 10/18/2012 12:09 PM    STOP taking these medications         ALPRAZolam 1 MG tablet   Commonly known as: XANAX      cyclobenzaprine 10 MG tablet   Commonly known as: FLEXERIL      Oxycodone HCl 10 MG Tabs      TAKE these medications         FLOMAX 0.4 MG Caps   Generic drug: Tamsulosin HCl   Take 0.4 mg by mouth every morning.      guaifenesin 400 MG Tabs   Commonly known as: HUMIBID E   Take 400 mg by mouth 3 (three) times daily as needed. For cough      Nebivolol HCl 20 MG Tabs   Take 1 tablet (20 mg total) by mouth daily.      omeprazole 20 MG capsule   Commonly known as: PRILOSEC   Take 20 mg by mouth 3 (three) times daily.          Issues for Follow Up:  -Pt will need physical, occupational, and speech therapy for assistance with gaining strength at SNF -We restarted pt's previously discontinued home blood pressure medication (bystolic 10mg , then increased to 20mg ) as he had some fairly elevated blood  pressures while here in the hospital. Would follow BP's as an outpatient and determine necessity of continuing this medication -Pt with reported high alcohol intake (per daughter) - would monitor for signs of continued high alcohol use and refer for substance abuse counseling/rehab as indicated -QTc noted to be somewhat prolonged during this admission. Would recommend continued monitoring, especially if administering medications known to affect QTc. -Upon nursing admission questionnaire, pt was noted to have a history of suicidal ideation and suicide attempts. He denied SI/HI on the day of discharge. Would recommend he be monitored for suicidal ideation after leaving the hospital.  Outstanding Results: none  Follow-up Information    Follow up with LITTLE,JAMES, MD. Schedule an appointment as soon as possible for a visit in 1 week.   Contact information:   [address] [place] [address] [phone]          Discharge Condition:  stable  Discharge Disposition: to [facility]  Discharge Instructions: Please refer to Patient Instructions section of EMR for full details.  Patient was counseled important signs and symptoms that should prompt return to medical care, changes in medications, dietary instructions, activity restrictions, and follow up appointments.    Levert Feinstein, MD Family Medicine PGY-1

## 2012-10-16 NOTE — Progress Notes (Signed)
Physical Therapy Treatment Patient Details Name: Tyler [name] MRN: [id number] DOB: 08/21/1941 Today's Date: 10/16/2012 Time: 1191-4782 PT Time Calculation (min): 24 min  PT Assessment / Plan / Recommendation Comments on Treatment Session  continuing improvements in mentation and activity tolerance; Continue to recommend Rehab Consult to consider possible reahb stay to maximize independence and safety with mobility prior to dc home    Follow Up Recommendations  CIR     Does the patient have the potential to tolerate intense rehabilitation     Barriers to Discharge        Equipment Recommendations  Rolling walker with 5" wheels    Recommendations for Other Services Rehab consult  Frequency Min 3X/week   Plan Discharge plan remains appropriate;Frequency remains appropriate    Precautions / Restrictions Precautions Precautions: Fall Restrictions Weight Bearing Restrictions: No   Pertinent Vitals/Pain no apparent distress     Mobility  Bed Mobility Bed Mobility: Not assessed (presented to PT sitting EOB) Transfers Transfers: Sit to Stand;Stand to Sit Sit to Stand: 3: Mod assist;From chair/3-in-1;With upper extremity assist;With armrests Stand to Sit: 3: Mod assist;To chair/3-in-1;With upper extremity assist Details for Transfer Assistance: Cues for safety and hand placement; Noted decr control of descent with stand to sit Ambulation/Gait Ambulation/Gait Assistance: 1: +2 Total assist Ambulation/Gait: Patient Percentage: 70% Ambulation Distance (Feet): 120 Feet Assistive device: Rolling walker Ambulation/Gait Assistance Details: Cues for RW proximity, tall posture, and to push RW smoothly without picking it up; Noted tending to lean right, also with tendency for RW to list to Right (this is in contrast to his left lean last session); Second person for safety and to push chair behind Gait Pattern: Step-through pattern;Decreased stride length;Trunk flexed;Narrow base of  support Gait velocity: decreased    Exercises     PT Diagnosis:    PT Problem List:   PT Treatment Interventions:     PT Goals Acute Rehab PT Goals Time For Goal Achievement: 10/28/12 Potential to Achieve Goals: Fair Pt will go Sit to Stand: with min assist PT Goal: Sit to Stand - Progress: Progressing toward goal Pt will go Stand to Sit: with min assist PT Goal: Stand to Sit - Progress: Progressing toward goal Pt will Ambulate: 16 - 50 feet;with mod assist;with least restrictive assistive device PT Goal: Ambulate - Progress: Met (Will plan to update amb goal next session)  Visit Information  Last PT Received On: 10/16/12 Assistance Needed: +2    Subjective Data  Subjective: Agreeable to amb Patient Stated Goal: return home   Cognition  Overall Cognitive Status: Impaired Area of Impairment: Memory;Attention;Safety/judgement;Problem solving Arousal/Alertness: Awake/alert Orientation Level: Appears intact for tasks assessed Behavior During Session: [facility] for tasks performed Current Attention Level: Sustained Following Commands: Follows one step commands consistently Safety/Judgement: Decreased safety judgement for tasks assessed;Decreased awareness of need for assistance    Balance     End of Session PT - End of Session Equipment Utilized During Treatment: Gait belt Activity Tolerance: Patient tolerated treatment well Patient left: in chair;with call bell/phone within reach;with chair alarm set Nurse Communication: Mobility status   GP     Olen Pel [place], [place] [phone]  10/16/2012, 10:59 AM

## 2012-10-16 NOTE — Progress Notes (Signed)
Patient took his telemetry box off and refused to have RN put it back.Paged on call MD. Clydell Hakim

## 2012-10-16 NOTE — Progress Notes (Signed)
FMTS Attending Daily Note:  Renold Don MD  [phone] pager  Family Practice pager:  [phone] I have seen and examined this patient and have reviewed their chart. I have discussed this patient with the resident. I agree with the resident's findings, assessment and care plan.  Delirium resolved, likely combination of narcotics, benzo's, and anticholinergics as well as chronic EtOH usage.  Recommend switch to Baclofen as muscle relaxer on discharge as much less anticholinergic.  Stable for discharge to inpatient rehab.

## 2012-10-17 DIAGNOSIS — R4182 Altered mental status, unspecified: Secondary | ICD-10-CM

## 2012-10-17 DIAGNOSIS — F1010 Alcohol abuse, uncomplicated: Secondary | ICD-10-CM

## 2012-10-17 NOTE — Progress Notes (Signed)
FMTS Attending Daily Note:  Renold Don MD  [phone] pager  Family Practice pager:  [phone] I have seen and examined this patient and have reviewed their chart. I have discussed this patient with the resident. I agree with the resident's findings, assessment and care plan.  Medically stable for discharge.  Delirium has cleared.

## 2012-10-17 NOTE — Progress Notes (Signed)
Patient ID: Tyler [name], male   DOB: [birth year], 71 y.o.   MRN: [id number]  Family Medicine Teaching Service Daily Progress Note Service Page: [phone]   Subjective: Pt says he is feeling well today. Asking when he can go home. PT is with him and reports that he was able to sit up on the side of his bed without assistance.  Objective: Temp:  [97.4 F (36.3 C)-98.5 F (36.9 C)] 97.4 F (36.3 C) (12/18 0423) Pulse Rate:  [62-80] 62  (12/18 0423) Resp:  [18] 18  (12/18 0423) BP: (135-164)/(55-84) 163/84 mmHg (12/18 0423) SpO2:  [94 %-99 %] 94 % (12/18 0423) Weight:  [215 lb 3.3 oz (97.617 kg)] 215 lb 3.3 oz (97.617 kg) (12/17 2313) Exam: General: less disheveled appearing than previous, cooperative with exam, sitting up on side of bed HEENT: normocephalic Cardiovascular: regular rate and rhythm Respiratory: normal respiratory effort, CTAB Abdomen: soft, nontender, nondistended Extremities: lower extremities nontender to palpation, no lower extremity edema Neuro:Oriented to person, place, month, and year. Appears the most oriented and clear that I have seen him thus far during this admission.  I have reviewed the patient's medications, labs, imaging, and diagnostic testing.  Notable results are summarized below.  Medications:  Scheduled Meds: Heparin 5000u TID Protonix 40mg  PO daily Thiamine 100mg  daily Folate 1mg  daily MVI daily Bystolic 10mg  daily  PRN Meds: Tylenol 650 q6h prn Albuterol 2.5 mg neb q2h prn Ativan 1mg  q6h prn CIWA protocol Metoprolol 5mg  BID prn high BP Hydralazine 10mg  q6h prn high BP  IVF: none  Labs:  CBC  Lab 10/15/12 0625 10/13/12 0454 10/12/12 2115  WBC 7.9 10.2 10.5  HGB 14.0 12.9* 12.8*  HCT 40.5 36.6* 37.5*  PLT 198 180 182    BMET  Lab 10/15/12 0625 10/13/12 0454 10/12/12 2115 10/12/12 1305  NA 138 136 -- 134*  K 3.8 3.5 -- 3.6  CL 101 100 -- 96  CO2 20 24 -- 28  BUN 10 9 -- 11  CREATININE 0.66 0.65 0.67 --  GLUCOSE 107*  122* -- 132*  CALCIUM 10.0 9.6 -- 9.5    Imaging/Diagnostic Tests: CT Cervical Spine 12/13: Cervical degenerative changes. Negative for fracture.   CT Head 12/13: No acute abnormality.  CXR 12/13: Degraded by hypoaeration, body habitus, and portable technique. Bibasilar opacities; atelectasis versus pneumonia. Consider PA and lateral follow-up.  [name] No acute stroke is evident.  There is mild atrophy with chronic microvascular ischemic change.  Correlate clinically with regard to the left facial weakness.   Assessment/Plan: Tyler [name] is a 71 y.o. year old male presenting with altered mental status for 5 days. Differential diagnosis includes substance intoxication, CVA, meningitis. Most likely secondary to substance intoxication as he has many mental altering drugs onboard with increased dosing including Xanax 1 mg tid, Oxycodone 10 mg q 4 hrs, Flexeril 10 mg tid. Low Likelihood this could be TIA/CVA as patient has had 5 days of this, no focal neurologic weakness, no facial droop, and CT scan does not show acute changes.  Low Likelihood as well that this may be meningitis. No WBC, no fever, no focal neurologic deficits. Is c/o some neck stiffness, however, he does have baseline neck and back pain and per daughter this has not changed over the last several days.   # Acute Delirium - most likely secondary to new medications -Pt continues to appear improved this morning -LFT's normal along with PT/INR (suggest normal clearance of medications) -Salicylate Level,  Acetaminophen Level, EtOH levels all negative -Telemetry, vitals and neuro checks q shift.  -continue sitter at bedside  # Neck Pain -Pt with chronic neck pain at home, and taking chronic narcotics -Continue morphine 2mg  q4hprn - has not required any in several days  # Prolonged QTc -EKG the morning after admission showing QTc of 487, up from 454 upon admission -Will avoid further doses of medications that  prolong QTc  # COPD - Pt has signs of hyperinflation on CXR, with bibasilar opacities but film is of poor quality -O2 saturations at 96% on RA, monitor closely.  -continuous pulse ox while altered mental status -Previous notes state pt is on Symbicort for his COPD. Not on his home meds list and daughter unsure. Continue prn albuterol for now. -s/p dose of ceftriaxone and azithromycin for CAP given CXR findings -No WBC, afebrile, no hypoxia, thus antibiotics discontinued at admission  # Lung Mass - Being followed by Dr. Delton Coombes, pulmonology  -Tissue biopsy has been inconclusive to date -CT and MRI head does not show mets to the brain.   # HTN - Pt recently taken off his BP medication (Bystolic due to it's B1 selectivity) around 2 months ago -Continue bystolic 10mg  daily (restarted 12/16) and follow blood pressures  # Chronic Alcohol Use - Unsure of the extent of this. -LFT's and INR normal, suggest normal liver functioning -CIWA protocol as pt may be going through withdrawal  -CSW for alcohol counseling   # FEN/GI:  -Protonix IV 40 mg -dysphagia 3 diet  # Prophylaxis: Heparin SQ  # Disposition:  -SNF vs. Inpatient rehab - awaiting inpatient rehab consultation and recommendations -medically stable for discharge at this time  # Code Status: DNR   Levert Feinstein, MD Family Medicine PGY-1

## 2012-10-17 NOTE — Progress Notes (Signed)
Speech Language Pathology Dysphagia Treatment Patient Details Name: Tyler [name] MRN: [id number] DOB: [birth year] Today's Date: 10/17/2012 Time: 7846-9629 SLP Time Calculation (min): 10 min  Assessment / Plan / Recommendation Clinical Impression  Pt. seen for swallow treatment.  Pt. consumed thin water via cup with one immediate throat clear likely due to attempting to speak to RN tech immediately after swallowing.  He has remained afebrile with lung sounds stable per RN documentation.  Recommend upgrade diet texture to regular and continue thin liquids.  No f/u needed at this time or discharge.      Diet Recommendation  Initiate / Change Diet: Regular;Thin liquid    SLP Plan Discharge SLP treatment due to (comment);All goals met       Swallowing Goals  SLP Swallowing Goals Patient will consume recommended diet without observed clinical signs of aspiration with: Minimal assistance Swallow Study Goal #1 - Progress: Met Patient will utilize recommended strategies during swallow to increase swallowing safety with: Minimal assistance Swallow Study Goal #2 - Progress: Met Goal #3: Patient will consume diagnostic PO trials of ice chips administered by SLP only with no outward s/s of aspiration.   Swallow Study Goal #3 - Progress: Met  General Temperature Spikes Noted: No Respiratory Status: Room air Behavior/Cognition: Alert;Cooperative;Pleasant mood;Requires cueing Oral Cavity - Dentition: Adequate natural dentition Patient Positioning: Upright in bed  Oral Cavity - Oral Hygiene Does patient have any of the following "at risk" factors?: None of the above Brush patient's teeth BID with toothbrush (using toothpaste with fluoride): Yes   Dysphagia Treatment Treatment focused on: Skilled observation of diet tolerance Treatment Methods/Modalities: Skilled observation Patient observed directly with PO's: Yes Type of PO's observed: Thin liquids Feeding: Able to feed self Liquids  provided via: Cup;No straw Pharyngeal Phase Signs & Symptoms: Immediate throat clear Type of cueing: Verbal Amount of cueing: Minimal        Royce Macadamia M.Ed [employer] [phone]  10/17/2012

## 2012-10-17 NOTE — Consult Note (Signed)
Physical Medicine and Rehabilitation Consult Reason for Consult: deconditioning Referring Physician:  Dr. Deirdre Priest   HPI: Tyler [name] is a 71 y.o. male with a history of alcohol abuse, chronic pain who was admitted to the hospital with altered mental status for five days. Patient with recent increase in several of his medications (xanax, oxycodone, and flexeril). Initial imaging of his head included a CT without contrast, which was negative for an acute bleed. Urine drug screen was positive only for benzodiazepines upon admission. On the day after admission, a new left facial droop was noted and a STAT MRI of the brain was obtained, which did not show an acute infarct. By 12/16, his mental status had much improved, and he was more cooperative, more oriented, and less combative, although he was not entirely back to his baseline level of mentation. Facial droop and delirium have resolved. Physical, occupational, and speech therapy all saw and evaluated the patient, and recommended Inpatient Rehabilitation upon discharge   Review of Systems  HENT: Positive for neck pain. Negative for hearing loss.   Eyes: Negative for blurred vision and double vision.  Respiratory: Positive for shortness of breath. Negative for cough and wheezing.   Cardiovascular: Positive for chest pain (chronic diffuse "due to smoking").  Gastrointestinal: Positive for heartburn. Negative for constipation.  Genitourinary: Negative for urgency.  Musculoskeletal: Positive for back pain.  Neurological: Negative for headaches.   Past Medical History  Diagnosis Date  . Allergic rhinitis   . BPH (benign prostatic hyperplasia)   . GERD (gastroesophageal reflux disease)   . COPD (chronic obstructive pulmonary disease)   . Shortness of breath   . Arthritis   . Enlarged prostate   . Hypertension     dr Shon Hale [place] in [place]   pcp      dr Jens Som  cardiac md  . Mental disorder   . Constipation   . Cancer Lung   Past  Surgical History  Procedure Date  . Ankle surgery 2005  . Prostate surgery 11/25/2010  . Hernia repair   . Mediastinoscopy 06/11/2012    Procedure: MEDIASTINOSCOPY;  Surgeon: Delight Ovens, MD;  Location: [facility] OR;  Service: Thoracic;  Laterality: N/A;  . Inguinal hernia repair     left  . Eye surgery     cataract right  . Video bronchoscopy with endobronchial ultrasound 10/08/2012    Procedure: VIDEO BRONCHOSCOPY WITH ENDOBRONCHIAL ULTRASOUND;  Surgeon: Leslye Peer, MD;  Location: [facility] OR;  Service: Pulmonary;  Laterality: N/A;   Family History  Problem Relation Age of Onset  . Heart disease Mother   . Arthritis Sister   . Arthritis Sister   . Liver cancer Father    Social History:  Lives alone. He reports that he quit smoking about 23years ago. His smoking use included Cigarettes. He has a 100 pack-year smoking history. He has never used smokeless tobacco. He reports that he drinks about 14.4 ounces of alcohol per week. He reports that he does not use illicit drugs.  Allergies  Allergen Reactions  . Ketorolac Tromethamine Anaphylaxis  . Codeine Itching  . Hydrocod Polst-Cpm Polst Er Rash  . Tramadol Rash  . Tussionex Pennkinetic Er (Hydrocod Polst-Cpm Polst Er) Rash   Medications Prior to Admission  Medication Sig Dispense Refill  . cyclobenzaprine (FLEXERIL) 10 MG tablet Take 10 mg by mouth 3 (three) times daily.      Marland Kitchen guaifenesin (HUMIBID E) 400 MG TABS Take 400 mg by mouth 3 (three) times daily  as needed. For cough      . omeprazole (PRILOSEC) 20 MG capsule Take 20 mg by mouth 3 (three) times daily.       . Tamsulosin HCl (FLOMAX) 0.4 MG CAPS Take 0.4 mg by mouth every morning.       . [DISCONTINUED] ALPRAZolam (XANAX) 1 MG tablet Take 1 mg by mouth 3 (three) times daily as needed. For anxiety      . [DISCONTINUED] Oxycodone HCl 10 MG TABS Take 10 mg by mouth every 4 (four) hours as needed. For pain        Home: Home Living Lives With: Alone Available Help at  Discharge: Family;Skilled Nursing Facility Type of Home: House Home Access: Stairs to enter Secretary/administrator of Steps: 3 Entrance Stairs-Rails:  (to be determined) Home Layout: One level Home Adaptive Equipment: Walker - rolling Additional Comments: Dtr present at beginning of session and verified PLOF and that pt. will be going to rehab post discharge  Functional History: Prior Function Able to Take Stairs?: Yes Driving: Yes Vocation: Retired Comments: Dtr reports that pt. began using RW ~1 week PTA Functional Status:  Mobility: Bed Mobility Bed Mobility: Supine to Sit Rolling Right: 1: +2 Total assist;With rail Rolling Right: Patient Percentage: 50% Rolling Left: 3: Mod assist;With rail Right Sidelying to Sit: 1: +2 Total assist;With rails Right Sidelying to Sit: Patient Percentage: 50% Left Sidelying to Sit: 3: Mod assist Supine to Sit: 6: Modified independent (Device/Increase time) Sitting - Scoot to Edge of Bed: 5: Supervision Transfers Transfers: Sit to Stand;Stand to Sit Sit to Stand: 4: Min guard;From bed;From chair/3-in-1;With upper extremity assist Sit to Stand: Patient Percentage: 70% Stand to Sit: Without upper extremity assist;To chair/3-in-1;To bed;4: Min guard Stand to Sit: Patient Percentage: 50% Ambulation/Gait Ambulation/Gait Assistance: 1: +2 Total assist Ambulation/Gait: Patient Percentage: 70% Ambulation Distance (Feet): 120 Feet Assistive device: Rolling walker Ambulation/Gait Assistance Details: Cues for RW proximity, tall posture, and to push RW smoothly without picking it up; Noted tending to lean right, also with tendency for RW to list to Right (this is in contrast to his left lean last session); Second person for safety and to push chair behind Gait Pattern: Step-through pattern;Decreased stride length;Trunk flexed;Narrow base of support Gait velocity: decreased Stairs: No    ADL: ADL Eating/Feeding: Simulated;Minimal assistance Where  Assessed - Eating/Feeding: Chair Grooming: Performed;Shaving;Wash/dry hands;Wash/dry face;Set up;Modified independent;Min guard;Other (comment) (Min guard shaving only) Where Assessed - Grooming: Unsupported sitting Upper Body Bathing: Simulated;Supervision/safety Where Assessed - Upper Body Bathing: Unsupported sitting Lower Body Bathing: +2 Total assistance Upper Body Dressing: Simulated;Min guard Where Assessed - Upper Body Dressing: Unsupported sitting Lower Body Dressing: +2 Total assistance Where Assessed - Lower Body Dressing: Supported sit to stand Toilet Transfer: Performed;Min guard Statistician Method: Sit to Barista: Bedside commode;Other (comment) (RW) Tub/Shower Transfer Method: Not assessed Equipment Used: Gait belt;Rolling walker;Other (comment) (3:1) Transfers/Ambulation Related to ADLs: Pt was Min guard sit-stand today, no noted lean to left or LOB sitting at EOB for ADL's and or when transferring to 3:1 in room using RW. ADL Comments: Pt able to sit at EOB for supervision level ADL's w/o lean to left (Min guard shaving w/ electric raszor secondary to no mirror), sit to stand Min guard and transfer w/ RW to 3:1. Pt w/o LOB, or lean left as previously noted. Progressing nicely.  Cognition: Cognition Arousal/Alertness: Awake/alert Orientation Level: Oriented X4 Cognition Overall Cognitive Status: Impaired (Pt questioning if phone, glasses etc"Have been taken") Area of  Impairment: Other (comment) (see above, otherwise pt was A & O x3 today) Arousal/Alertness: Awake/alert Orientation Level: Appears intact for tasks assessed Behavior During Session: [facility] for tasks performed Current Attention Level: Sustained Memory Deficits: Pt with difficulty recalling children and grandchildren names and who were children vs grandchildren. Could decipher through pt story but pt unable to recognize lack of clarity and correctness of statements Following Commands:  Follows multi-step commands consistently Safety/Judgement: Decreased safety judgement for tasks assessed;Decreased awareness of need for assistance Safety/Judgement - Other Comments: Pt with left lean throughout tx and although aware of lean pt unable to correct without cueing Awareness of Errors: Assistance required to identify errors made Problem Solving: Pt unable to recognize with safety that he was going to sit on armrest Cognition - Other Comments: Pt. with confusion, and hallucinating at times during eval  Blood pressure 163/84, pulse 62, temperature 97.4 F (36.3 C), temperature source Oral, resp. rate 18, height 6\' 2"  (1.88 m), weight 97.617 kg (215 lb 3.3 oz), SpO2 94.00%. Physical Exam  Nursing note and vitals reviewed. Constitutional: He is oriented to person, place, and time. He appears well-developed and well-nourished.  HENT:  Head: Normocephalic and atraumatic.  Eyes: Pupils are equal, round, and reactive to light.  Neck: Normal range of motion.  Cardiovascular: Normal rate and regular rhythm.   Pulmonary/Chest: Effort normal and breath sounds normal.  Abdominal: Soft. Bowel sounds are normal.  Neurological: He is alert and oriented to person, place, and time.       Dysarthric speech. Poor insight with decreased awareness of deficits. Moves all four. Stood with limited safety awareness. Wanted to fall backward. Motor grossly 4+. No focal sensory deficits. Decreased FMC    No results found for this or any previous visit (from the past 24 hour(s)). No results found.  Assessment/Plan: Diagnosis: ETOH, polysubstance abuse, chronic gait disorder, admitted with acute delirium due to mixing narcs/neuroactive meds with etoh 1. Does the need for close, 24 hr/day medical supervision in concert with the patient's rehab needs make it unreasonable for this patient to be served in a less intensive setting? No 2. Co-Morbidities requiring supervision/potential complications: copd 3. Due  to bladder management, bowel management, safety, skin/wound care, disease management, medication administration, pain management and patient education, does the patient require 24 hr/day rehab nursing? No 4. Does the patient require coordinated care of a physician, rehab nurse, PT and OT to address physical and functional deficits in the context of the above medical diagnosis(es)? No Addressing deficits in the following areas: balance, endurance, strength, transferring, bathing and dressing 5. Can the patient actively participate in an intensive therapy program of at least 3 hrs of therapy per day at least 5 days per week? Potentially 6. The potential for patient to make measurable gains while on inpatient rehab is poor 7. Anticipated functional outcomes upon discharge from inpatient rehab are n/a with PT, n/a with OT, n/a with SLP. 8. Estimated rehab length of stay to reach the above functional goals is: n/a 9. Does the patient have adequate social supports to accommodate these discharge functional goals? Potentially 10. Anticipated D/C setting: Home 11. Anticipated post D/C treatments: HH therapy 12. Overall Rehab/Functional Prognosis: fair  RECOMMENDATIONS: This patient's condition is appropriate for continued rehabilitative care in the following setting: [facility] or SNF Patient has agreed to participate in recommended program. Potentially Note that insurance prior authorization may be required for reimbursement for recommended care.  Comment: Pt has a friend who he claims has been helping  him at home "watch his med, drinking, etc". Either he can go home with supervision or needs short term SNF.  Ivory Broad, MD    10/17/2012

## 2012-10-17 NOTE — Progress Notes (Signed)
[employer] will not approve an inpt rehab admission for this diagnosis nor is inpt rehab the most appropriate rehab venue at this time. [phone]

## 2012-10-17 NOTE — Progress Notes (Signed)
Occupational Therapy Treatment Patient Details Name: Tyler [name] MRN: [id number] DOB: 22-May-1941 Today's Date: 10/17/2012 Time: 6213-0865 OT Time Calculation (min): 29 min  OT Assessment / Plan / Recommendation Comments on Treatment Session Pt able to sit at EOB for supervision level ADL's w/o lean to left (Min guard shaving w/ electric raszor secondary to no mirror), sit to stand Min guard and transfer w/ RW to 3:1. Pt w/o LOB, or lean left as previously noted. Progressing nicely    Follow Up Recommendations  ?CIR vs SNF                                 Precautions / Restrictions Precautions Precautions: Fall Restrictions Weight Bearing Restrictions: No   Pertinent Vitals/Pain No c/o pain    ADL  Grooming: Performed;Shaving;Wash/dry hands;Wash/dry face;Set up;Modified independent;Min guard;Other (comment) (Min guard shaving only) Where Assessed - Grooming: Unsupported sitting Upper Body Bathing: Simulated;Supervision/safety Where Assessed - Upper Body Bathing: Unsupported sitting Upper Body Dressing: Simulated;Min guard Where Assessed - Upper Body Dressing: Unsupported sitting Toilet Transfer: Performed;Min guard Toilet Transfer Method: Sit to Barista: Bedside commode;Other (comment) (RW) Toileting - Clothing Manipulation and Hygiene: Performed;Min guard Where Assessed - Engineer, mining and Hygiene: Standing;Sit to stand from 3-in-1 or toilet Tub/Shower Transfer Method: Not assessed Equipment Used: Gait belt;Rolling walker;Other (comment) (3:1) Transfers/Ambulation Related to ADLs: Pt was Min guard sit-stand today, no noted lean to left or LOB sitting at EOB for ADL's and or when transferring to 3:1 in room using RW. ADL Comments: Pt able to sit at EOB for supervision level ADL's w/o lean to left (Min guard shaving w/ electric raszor secondary to no mirror), sit to stand Min guard and transfer w/ RW to 3:1. Pt w/o LOB, or lean  left as previously noted. Progressing nicely.    OT Diagnosis:    OT Problem List:   OT Treatment Interventions:     OT Goals ADL Goals ADL Goal: Grooming - Progress: Partly met ADL Goal: Upper Body Dressing - Progress: Progressing toward goals ADL Goal: Toilet Transfer - Progress: Progressing toward goals ADL Goal: Additional Goal #1 - Progress: Progressing toward goals ADL Goal: Additional Goal #2 - Progress: Progressing toward goals  Visit Information  Last OT Received On: 10/17/12 Assistance Needed: +1    Subjective Data  Subjective: Pt A & O x3 Patient Stated Goal: Go home/"outside to see what it's like out"   Prior Functioning       Cognition  Overall Cognitive Status: Impaired (Pt questioning if phone, glasses etc"Have been taken") Area of Impairment: Other (comment) (see above, otherwise pt was A & O x3 today) Arousal/Alertness: Awake/alert Orientation Level: Appears intact for tasks assessed Behavior During Session: [facility] for tasks performed Current Attention Level: Sustained Following Commands: Follows multi-step commands consistently    Mobility  Shoulder Instructions Bed Mobility Bed Mobility: Supine to Sit Supine to Sit: 6: Modified independent (Device/Increase time) Sitting - Scoot to Edge of Bed: 5: Supervision Transfers Transfers: Sit to Stand;Stand to Sit Sit to Stand: 4: Min guard;From bed;From chair/3-in-1;With upper extremity assist Stand to Sit: Without upper extremity assist;To chair/3-in-1;To bed;4: Min guard Details for Transfer Assistance: VC's for hand placement during transfer to 3:1, bed       Exercises      Balance Balance Balance Assessed: Yes Static Sitting Balance Static Sitting - Balance Support: No upper extremity supported;Feet supported Static Sitting - Level of  Assistance: 7: Independent Static Sitting - Comment/# of Minutes: Sit EOB x10+min w/o Left lean or LOB noted Static Standing Balance Static Standing - Balance  Support: No upper extremity supported Static Standing - Level of Assistance: 4: Min assist   End of Session OT - End of Session Equipment Utilized During Treatment: Gait belt;Other (comment) (RW) Activity Tolerance: Patient tolerated treatment well Patient left: in bed;with call bell/phone within reach;with bed alarm set Nurse Communication: Mobility status  GO     Alm Bustard 10/17/2012, 11:52 AM

## 2012-10-18 MED ORDER — NEBIVOLOL HCL 10 MG PO TABS: 20.0000 mg | ORAL_TABLET | Freq: Every day | ORAL | Status: DC

## 2012-10-18 MED ORDER — NEBIVOLOL HCL 20 MG PO TABS: 20.0000 mg | ORAL_TABLET | Freq: Every day | ORAL | Status: DC

## 2012-10-18 NOTE — Progress Notes (Signed)
Patient ID: Tyler [name], male   DOB: 07-07-1941, 71 y.o.   MRN: [id number]  Family Medicine Teaching Service Daily Progress Note Service Page: [phone]   Subjective: Pt without complaints today, other than saying he doesn't want to go to a SNF. Still requiring some intermittent redirection from daughter, but overall relatively oriented. Denies SI/HI.  Objective: Temp:  [97 F (36.1 C)-99.6 F (37.6 C)] 98.6 F (37 C) (12/19 0612) Pulse Rate:  [68-74] 74  (12/19 0612) Resp:  [18] 18  (12/19 0612) BP: (155-165)/(74-94) 163/94 mmHg (12/19 0612) SpO2:  [93 %-97 %] 93 % (12/19 0612) Weight:  [213 lb 14.4 oz (97.024 kg)] 213 lb 14.4 oz (97.024 kg) (12/18 2050) Exam: General: cooperative with exam, sitting up in chair, dressed in normal clothing and appears well groomed HEENT: normocephalic Cardiovascular: regular rate and rhythm Respiratory: normal respiratory effort Abdomen: soft, nontender, nondistended Extremities: lower extremities nontender to palpation Neuro: Oriented to person, place, month, and year. Continues to appear improved daily regarding orientation.  I have reviewed the patient's medications, labs, imaging, and diagnostic testing.  Notable results are summarized below.  Medications:  Scheduled Meds: Heparin 5000u TID Protonix 40mg  PO daily Thiamine 100mg  daily Folate 1mg  daily MVI daily Bystolic 10mg  daily  PRN Meds: Tylenol 650 q6h prn Albuterol 2.5 mg neb q2h prn Ativan 1mg  q6h prn CIWA protocol Metoprolol 5mg  BID prn high BP Hydralazine 10mg  q6h prn high BP  IVF: none  Labs:  CBC  Lab 10/15/12 0625 10/13/12 0454 10/12/12 2115  WBC 7.9 10.2 10.5  HGB 14.0 12.9* 12.8*  HCT 40.5 36.6* 37.5*  PLT 198 180 182    BMET  Lab 10/15/12 0625 10/13/12 0454 10/12/12 2115 10/12/12 1305  NA 138 136 -- 134*  K 3.8 3.5 -- 3.6  CL 101 100 -- 96  CO2 20 24 -- 28  BUN 10 9 -- 11  CREATININE 0.66 0.65 0.67 --  GLUCOSE 107* 122* -- 132*  CALCIUM 10.0  9.6 -- 9.5    Imaging/Diagnostic Tests: CT Cervical Spine 12/13: Cervical degenerative changes. Negative for fracture.   CT Head 12/13: No acute abnormality.  CXR 12/13: Degraded by hypoaeration, body habitus, and portable technique. Bibasilar opacities; atelectasis versus pneumonia. Consider PA and lateral follow-up.  Tyler [name] Brain Wo Contrast No acute stroke is evident.  There is mild atrophy with chronic microvascular ischemic change.  Correlate clinically with regard to the left facial weakness.   Assessment/Plan: Tyler [name] is a 71 y.o. year old male presenting with altered mental status for 5 days. Differential diagnosis includes substance intoxication, CVA, meningitis. Most likely secondary to substance intoxication as he has many mental altering drugs onboard with increased dosing including Xanax 1 mg tid, Oxycodone 10 mg q 4 hrs, Flexeril 10 mg tid. Low Likelihood this could be TIA/CVA as patient has had 5 days of this, no focal neurologic weakness, no facial droop, and CT scan does not show acute changes.  Low Likelihood as well that this may be meningitis. No WBC, no fever, no focal neurologic deficits. Is c/o some neck stiffness, however, he does have baseline neck and back pain and per daughter this has not changed over the last several days.   # Acute Delirium - most likely secondary to new medications -Pt continues to appear improved this morning -LFT's normal along with PT/INR (suggest normal clearance of medications) -Salicylate Level, Acetaminophen Level, EtOH levels all negative -Telemetry, vitals and neuro checks q shift.   # Neck Pain -  Pt with chronic neck pain at home, and taking chronic narcotics -Continue morphine 2mg  q4hprn - has not required any in several days  # Prolonged QTc -EKG the morning after admission showing QTc of 487, up from 454 upon admission -Will avoid further doses of medications that prolong QTc  # COPD - Pt has signs of hyperinflation on  CXR, with bibasilar opacities but film is of poor quality -O2 saturations at 96% on RA, monitor closely.  -continuous pulse ox while altered mental status -Previous notes state pt is on Symbicort for his COPD. Not on his home meds list and daughter unsure. Continue prn albuterol for now. -s/p dose of ceftriaxone and azithromycin for CAP given CXR findings -No WBC, afebrile, no hypoxia, thus antibiotics discontinued at admission  # Lung Mass - Being followed by Dr. Delton Coombes, pulmonology  -Tissue biopsy has been inconclusive to date -CT and MRI head does not show mets to the brain.   # HTN - Pt recently taken off his BP medication (Bystolic due to it's B1 selectivity) around 2 months ago -Restarted bystolic on 12/16 at 10mg  daily, however will increase to 20mg  daily as pt has had some continued high blood pressures (although improved from previously)  # Chronic Alcohol Use - Unsure of the extent of this. -LFT's and INR normal, suggest normal liver functioning -CIWA protocol as pt may be going through withdrawal  -CSW for alcohol counseling   # FEN/GI:  -Protonix IV 40 mg -dysphagia 3 diet  # Prophylaxis: Heparin SQ  # Disposition:  -Inpatient rehab evaluated pt yesterday, and does not feel he is a good candidate for inpatient rehab services, so pt will go to SNF. We are awaiting placement. -medically stable for discharge at this time  # Code Status: DNR   Levert Feinstein, MD [facility] Medicine PGY-1

## 2012-10-18 NOTE — Progress Notes (Signed)
Paged Dr. Gwendolyn Grant regarding the need for [facility] signature for patient D/c today. Awaiting call back. Hanan Mcwilliams, Charlyne Quale

## 2012-10-18 NOTE — Progress Notes (Signed)
FMTS Attending Daily Note:  Renold Don MD  [phone] pager  Family Practice pager:  [phone] I have discussed this patient with the resident Dr. Pollie Meyer and have reviewed the patient's chart, imaging studies, and labs.  I agree with their findings, assessment, and care plan

## 2012-10-20 NOTE — Discharge Summary (Signed)
Family Medicine Teaching Service  Discharge Note : Attending Jeff Shalin Vonbargen MD Pager [phone] Inpatient Team Pager:  [phone]  I have seen and examined this patient, reviewed their chart and discussed discharge planning with the resident at the time of discharge. I agree with the discharge plan as above.  

## 2012-10-22 ENCOUNTER — Ambulatory Visit: Payer: Medicare Other | Admitting: Emergency Medicine

## 2012-11-01 ENCOUNTER — Encounter: Payer: Self-pay | Admitting: Adult Health

## 2012-11-01 ENCOUNTER — Ambulatory Visit (INDEPENDENT_AMBULATORY_CARE_PROVIDER_SITE_OTHER): Payer: Medicare Other | Admitting: Adult Health

## 2012-11-01 ENCOUNTER — Ambulatory Visit (INDEPENDENT_AMBULATORY_CARE_PROVIDER_SITE_OTHER)
Admission: RE | Admit: 2012-11-01 | Discharge: 2012-11-01 | Disposition: A | Discharge status: Home / Self Care | Payer: Medicare Other | Source: Ambulatory Visit | Attending: Adult Health | Admitting: Adult Health

## 2012-11-01 VITALS — BP 134/72 | HR 60 | Temp 99.0°F | Ht 74.0 in | Wt 230.4 lb

## 2012-11-01 DIAGNOSIS — R918 Other nonspecific abnormal finding of lung field: Secondary | ICD-10-CM

## 2012-11-01 DIAGNOSIS — R222 Localized swelling, mass and lump, trunk: Secondary | ICD-10-CM

## 2012-11-01 DIAGNOSIS — J449 Chronic obstructive pulmonary disease, unspecified: Secondary | ICD-10-CM

## 2012-11-01 MED ORDER — AMOXICILLIN-POT CLAVULANATE 875-125 MG PO TABS: 1.0000 | ORAL_TABLET | Freq: Two times a day (BID) | ORAL | Status: AC

## 2012-11-01 NOTE — Assessment & Plan Note (Signed)
Mild flare CXR with no acute process   Plan  Augmentin 875mg  Twice daily  For 7 days -take w/ food Mucinex DM Twice daily  As needed  Cough/congestion  Fluids and rest  Please contact office for sooner follow up if symptoms do not improve or worsen or seek emergency care  Follow up Dr. Delton Coombes  In 4 weeks and As needed

## 2012-11-01 NOTE — Patient Instructions (Addendum)
Augmentin 875mg  Twice daily  For 7 days -take w/ food Mucinex DM Twice daily  As needed  Cough/congestion  Fluids and rest  Please contact office for sooner follow up if symptoms do not improve or worsen or seek emergency care  Follow up Dr. Delton Coombes  In 4 weeks and As needed

## 2012-11-01 NOTE — Progress Notes (Signed)
Subjective:    Patient ID: Tyler [name], male    DOB: July 12, 1941, 72 y.o.   MRN: [id number]  HPI 72 yo former heavy smoker, hx HTN, DM, allergies. Has been dx with COPD about 2 weeks ago, started on Advair but only uses it about once a week. Has been rx for PNA in the past, including as a child. Was referred for abnormal CXR and CT scan chest by Dr Clarene Duke. He was well until 2 months ago when he developed gastroenteritis w Nausea, diarrhea. Finally got better but he has had poor appetite, has lost about 20 lbs over 2 months. May be a bit better now. Part of the eval for his wt loss, especially w smoking hx, was CXR and then CT scan. His eval revealed erythrocytosis, some renal insufficiency. The CT scan shows a L hilar area that looks like scar vs bronchiectasis.   ROV 06/29/12 -- follow up visit following his FOB/EBUS and then subsequent mediastinoscopy. The pathology was negative for malignancy. He is still having nausea, bitter taste in his mouth despite omeprazole tid. He is on Advair, restarted it a month ago. Seems to be helping his breathing.   08/30/2012 acute w/in ov / Wert cc new sob since 10/27 ok at rest, assoc with new ant pleuritic and positional L CP > ct chest 08/29/12 1. Increase in size of the spiculated lesion noted previously in  the left suprahilar region with narrowing of the bronchus to the  superior segment of the left lower lobe and a new nodular opacity  within the left lower lobe. These findings are highly suspicious  for primary lung carcinoma with metastatic involvement of the left  lower lobe.  2. No increase in mediastinal or hilar adenopathy is seen.  3. Centrilobular emphysema.  4. Moderate sized hiatal hernia  ROV 09/13/12 -- COPD, L hilar mass that has been negative on EBUS and mediastinoscopy. Now with more distal nodular LLL opacity, ? Post-obstructive process vs malignancy. He saw Dr Sherene Sires 10/31 and started symbicort >. Feels that it has helped some. He has  started hospice services through Dr Clarene Duke, ostensibly for lung cancer.  >referred to [facility] , repeat serial CT in 6 months   11/01/2012 Acute OV  Complains of left lung pain, increased SOB, occ prod cough with white mucus x1 week - states had 3 sharp pains at onset and felt like the lung had collapsed. Cough is getting worse. No hemoptysis, fever or edema.  No otc used.  CXR today shows no acute process.    Did have hospital admit 3 weeks ago with delirium felt secondary to polypharmacy.  Improved with no recurrent symptoms       ROS:  Constitutional:   No  weight loss, night sweats,  Fevers, chills, + fatigue, or  lassitude.  HEENT:   No headaches,  Difficulty swallowing,  Tooth/dental problems, or  Sore throat,                No sneezing, itching, ear ache, nasal congestion, post nasal drip,   CV:  No chest pain,  Orthopnea, PND, swelling in lower extremities, anasarca, dizziness, palpitations, syncope.   GI  No heartburn, indigestion, abdominal pain, nausea, vomiting, diarrhea, change in bowel habits, loss of appetite, bloody stools.   Resp:    No coughing up of blood.     No chest wall deformity  Skin: no rash or lesions.  GU: no dysuria, change in color of urine, no urgency or frequency.  No flank pain, no hematuria   MS:  No joint pain or swelling.  No decreased range of motion.  No back pain.  Psych:  No change in mood or affect. No depression or anxiety.  No memory loss.      Objective:   Physical Exam  Gen: Pleasant, well-nourished, in no distress,  normal affect  ENT: No lesions,  mouth clear,  oropharynx clear, no postnasal drip  Neck: No JVD, no TMG, no carotid bruits, mediastinoscopy incision looks good  Lungs: No use of accessory muscles, no dullness to percussion,  Diminshed BS in bases   Cardiovascular: RRR, heart sounds normal, no murmur or gallops, no peripheral edema  Musculoskeletal: No deformities, no cyanosis or clubbing  Neuro: alert, non  focal  Skin: Warm, no lesions or rashes    Assessment & Plan:

## 2012-11-29 ENCOUNTER — Ambulatory Visit (INDEPENDENT_AMBULATORY_CARE_PROVIDER_SITE_OTHER): Payer: Medicare Other | Admitting: Emergency Medicine

## 2012-11-29 ENCOUNTER — Encounter: Payer: Self-pay | Admitting: Emergency Medicine

## 2012-11-29 VITALS — BP 160/86 | HR 62 | Temp 97.0°F | Ht 74.0 in | Wt 234.4 lb

## 2012-11-29 DIAGNOSIS — R222 Localized swelling, mass and lump, trunk: Secondary | ICD-10-CM

## 2012-11-29 DIAGNOSIS — R918 Other nonspecific abnormal finding of lung field: Secondary | ICD-10-CM

## 2012-11-29 DIAGNOSIS — J449 Chronic obstructive pulmonary disease, unspecified: Secondary | ICD-10-CM

## 2012-11-29 DIAGNOSIS — R079 Chest pain, unspecified: Secondary | ICD-10-CM

## 2012-11-29 NOTE — Assessment & Plan Note (Signed)
Seems to relate to cough, although he is poor historian. He doesn't want to do any other workup - has had a reassuring Lexiscan with Dr Jens Som last Summer

## 2012-11-29 NOTE — Progress Notes (Signed)
Subjective:    Patient ID: Tyler [name], male    DOB: 1941/01/12, 72 y.o.   MRN: [id number] HPI 72 yo former heavy smoker, hx HTN, DM, allergies. Has been dx with COPD about 2 weeks ago, started on Advair but only uses it about once a week. Has been rx for PNA in the past, including as a child. Was referred for abnormal CXR and CT scan chest by Dr Clarene Duke. He was well until 2 months ago when he developed gastroenteritis w Nausea, diarrhea. Finally got better but he has had poor appetite, has lost about 20 lbs over 2 months. May be a bit better now. Part of the eval for his wt loss, especially w smoking hx, was CXR and then CT scan. His eval revealed erythrocytosis, some renal insufficiency. The CT scan shows a L hilar area that looks like scar vs bronchiectasis.   ROV 06/29/12 -- follow up visit following his FOB/EBUS and then subsequent mediastinoscopy. The pathology was negative for malignancy. He is still having nausea, bitter taste in his mouth despite omeprazole tid. He is on Advair, restarted it a month ago. Seems to be helping his breathing.   08/30/2012 acute w/in ov / Wert cc new sob since 10/27 ok at rest, assoc with new ant pleuritic and positional L CP > ct chest 08/29/12 1. Increase in size of the spiculated lesion noted previously in  the left suprahilar region with narrowing of the bronchus to the  superior segment of the left lower lobe and a new nodular opacity  within the left lower lobe. These findings are highly suspicious  for primary lung carcinoma with metastatic involvement of the left  lower lobe.  2. No increase in mediastinal or hilar adenopathy is seen.  3. Centrilobular emphysema.  4. Moderate sized hiatal hernia  ROV 09/13/12 -- COPD, L hilar mass that has been negative on EBUS and mediastinoscopy. Now with more distal nodular LLL opacity, ? Post-obstructive process vs malignancy. He saw Dr Sherene Sires 10/31 and started symbicort >. Feels that it has helped some. He has  started hospice services through Dr Clarene Duke, ostensibly for lung cancer.  >referred to [facility] , repeat serial CT in 6 months   Acute OV 11/01/12 Complains of left lung pain, increased SOB, occ prod cough with white mucus x1 week - states had 3 sharp pains at onset and felt like the lung had collapsed. Cough is getting worse. No hemoptysis, fever or edema.  No otc used.  CXR today shows no acute process.   Did have hospital admit 3 weeks ago with delirium felt secondary to polypharmacy.  Improved with no recurrent symptoms   ROV 11/29/12 -- COPD, L hilar mass that has been negative on EBUS x 2, also mediastinoscopy. 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 Hospice now stopped. He was recently rx for a bronchitis 1/14 as above. Plan has been for repeat CT scan in April 2014 with plan to reconsider bx at that time. He tells me that he is feeling OK, he does have some L sided CP, can happen at random, doesn't last long, minutes. Freq cough productive of white phlegm. He quit Symbicort - didn't think it was helping him.     Objective:   Physical Exam Filed Vitals:   11/29/12 1053  BP: 160/86  Pulse: 62  Temp: 97 F (36.1 C)    Gen: Pleasant, well-nourished, in no distress,  normal affect  ENT: No lesions,  mouth clear,  oropharynx clear, no postnasal drip  Neck: No JVD, no TMG, no carotid bruits, mediastinoscopy incision looks good  Lungs: No use of accessory muscles, no dullness to percussion,  Diminshed BS in bases   Cardiovascular: RRR, heart sounds normal, no murmur or gallops, no peripheral edema  Musculoskeletal: No deformities, no cyanosis or clubbing  Neuro: alert, non focal  Skin: Warm, no lesions or rashes    Assessment & Plan:   Lung mass L hilar mass that has been biopsied x 3 >> all negative. Plan has ben to perform repeat Ct in April to reassess. He is fairly ambivalent about any further workup, but has  agreed today to get the repeat CT.   Chest pain Seems to relate to cough, although he is poor historian. He doesn't want to do any other workup - has had a reassuring Lexiscan with Dr Jens Som last Summer  COPD (chronic obstructive pulmonary disease) Again, he is ambivalent about starting any other therapy. He stopped symbicort, doesn't want to start any other BD.

## 2012-11-29 NOTE — Patient Instructions (Addendum)
We will set up tour repeat CT scan for April  We will not restart any inhalers right now Please call our office if your cough, breathing or chest discomfort change  Follow with Dr Delton Coombes in April after the CT scan to review

## 2012-11-29 NOTE — Assessment & Plan Note (Signed)
L hilar mass that has been biopsied x 3 >> all negative. Plan has ben to perform repeat Ct in April to reassess. He is fairly ambivalent about any further workup, but has agreed today to get the repeat CT.

## 2012-11-29 NOTE — Assessment & Plan Note (Signed)
Again, he is ambivalent about starting any other therapy. He stopped symbicort, doesn't want to start any other BD.

## 2013-02-21 ENCOUNTER — Other Ambulatory Visit: Payer: Medicare Other

## 2013-02-21 ENCOUNTER — Telehealth: Payer: Self-pay | Admitting: Emergency Medicine

## 2013-02-21 NOTE — Telephone Encounter (Signed)
Spoke to pt. He states that he spoke with someone from his insurance company and they told him that he would have to reschedule the CT scan. For some reason they won't pay for the CT.  I'm going to forward this message to [facility] so that they can look into this.  Thanks PCC's!

## 2013-02-25 NOTE — Telephone Encounter (Signed)
Pt os rescheduled for 03/22/13@9 :00am @[facility]  and will try to precert again Tyler [name]

## 2013-03-08 ENCOUNTER — Other Ambulatory Visit: Payer: Self-pay | Admitting: Emergency Medicine

## 2013-03-08 DIAGNOSIS — R918 Other nonspecific abnormal finding of lung field: Secondary | ICD-10-CM

## 2013-03-22 ENCOUNTER — Ambulatory Visit (INDEPENDENT_AMBULATORY_CARE_PROVIDER_SITE_OTHER)
Admission: RE | Admit: 2013-03-22 | Discharge: 2013-03-22 | Disposition: A | Discharge status: Home / Self Care | Payer: Medicare Other | Source: Ambulatory Visit | Attending: Emergency Medicine | Admitting: Emergency Medicine

## 2013-03-22 DIAGNOSIS — R918 Other nonspecific abnormal finding of lung field: Secondary | ICD-10-CM

## 2013-03-22 DIAGNOSIS — R222 Localized swelling, mass and lump, trunk: Secondary | ICD-10-CM

## 2013-04-02 ENCOUNTER — Telehealth: Payer: Self-pay | Admitting: Emergency Medicine

## 2013-04-02 NOTE — Telephone Encounter (Signed)
Dr. Delton Coombes pt is requesting his CT results ASAP. Please advise thanks

## 2013-04-02 NOTE — Telephone Encounter (Signed)
Attempted to call pt, no answer so left message that we would try him back.

## 2013-04-03 NOTE — Telephone Encounter (Signed)
Called pt, left message to call us back. I haven't been able to reach him - please call and see if he can come in to discuss in office or get a better contact number.

## 2013-04-04 NOTE — Telephone Encounter (Signed)
ATC patient @home  #, no answer [email or web address] Also ATC patients daughter Eunice Blase @ number in Epic, no answer [email or web address]

## 2013-04-04 NOTE — Telephone Encounter (Signed)
Spoke with patients daughter-- she will have patient call our office to schedule appt

## 2013-04-08 NOTE — Telephone Encounter (Signed)
[id number] with the pt to schedule an appt. Carron Curie, CMA

## 2013-04-09 NOTE — Telephone Encounter (Signed)
lmtcb

## 2013-04-10 NOTE — Telephone Encounter (Signed)
Discussed results with him. We will not pursue bx right now, will instead continue to follow by CT scan. Next scan in 6 months > 08/2013.

## 2013-04-10 NOTE — Telephone Encounter (Signed)
lmomtcb for pt I called pt's daughter, Eunice Blase, to see if she had a chance to talk with pt.  Eunice Blase states she did let him know but pt said he has a $45 copay every time he comes in, and pt states he cannot afford this right now just to come in an get results.  I advised Eunice Blase that this doesn't have to be paid at time of visit -- we can bill patient.  Eunice Blase states she will call pt back to let him know this.   Will route msg to RB -- pls advise.  Thank you.

## 2013-04-10 NOTE — Telephone Encounter (Signed)
Pt called back.  States he is unable to pay $45 to come in to find out the results.  Pt states the # provided above is the only contact # he has.  States when we call, his phone doesn't ring but does let him know we have left a VM.  Dr. Delton Coombes, will you pls attempt to call pt back with the results?

## 2013-09-19 ENCOUNTER — Ambulatory Visit (HOSPITAL_COMMUNITY): Payer: Medicare Other | Attending: Geriatric Medicine | Admitting: Radiology

## 2013-09-19 ENCOUNTER — Other Ambulatory Visit: Payer: Self-pay | Admitting: Nurse Practitioner

## 2013-09-19 ENCOUNTER — Ambulatory Visit
Admission: RE | Admit: 2013-09-19 | Discharge: 2013-09-19 | Disposition: A | Discharge status: Home / Self Care | Payer: Medicare Other | Source: Ambulatory Visit | Attending: Nurse Practitioner | Admitting: Nurse Practitioner

## 2013-09-19 ENCOUNTER — Other Ambulatory Visit (HOSPITAL_COMMUNITY): Payer: Self-pay | Admitting: Geriatric Medicine

## 2013-09-19 DIAGNOSIS — I509 Heart failure, unspecified: Secondary | ICD-10-CM

## 2013-09-19 DIAGNOSIS — J4489 Other specified chronic obstructive pulmonary disease: Secondary | ICD-10-CM | POA: Insufficient documentation

## 2013-09-19 DIAGNOSIS — R079 Chest pain, unspecified: Secondary | ICD-10-CM | POA: Insufficient documentation

## 2013-09-19 DIAGNOSIS — J449 Chronic obstructive pulmonary disease, unspecified: Secondary | ICD-10-CM | POA: Insufficient documentation

## 2013-09-19 DIAGNOSIS — R0602 Shortness of breath: Secondary | ICD-10-CM | POA: Insufficient documentation

## 2013-09-19 DIAGNOSIS — R609 Edema, unspecified: Secondary | ICD-10-CM

## 2013-09-19 DIAGNOSIS — R0609 Other forms of dyspnea: Secondary | ICD-10-CM | POA: Insufficient documentation

## 2013-09-19 DIAGNOSIS — I079 Rheumatic tricuspid valve disease, unspecified: Secondary | ICD-10-CM | POA: Insufficient documentation

## 2013-09-19 DIAGNOSIS — R222 Localized swelling, mass and lump, trunk: Secondary | ICD-10-CM | POA: Insufficient documentation

## 2013-09-19 DIAGNOSIS — I10 Essential (primary) hypertension: Secondary | ICD-10-CM | POA: Insufficient documentation

## 2013-09-19 DIAGNOSIS — R0989 Other specified symptoms and signs involving the circulatory and respiratory systems: Secondary | ICD-10-CM | POA: Insufficient documentation

## 2013-09-19 DIAGNOSIS — Z87891 Personal history of nicotine dependence: Secondary | ICD-10-CM | POA: Insufficient documentation

## 2013-09-19 NOTE — Progress Notes (Signed)
Echocardiogram performed.  

## 2013-11-07 NOTE — Progress Notes (Signed)
This encounter was created in error - please disregard.

## 2013-11-08 ENCOUNTER — Ambulatory Visit (INDEPENDENT_AMBULATORY_CARE_PROVIDER_SITE_OTHER): Payer: Medicare Other | Admitting: Internal Medicine

## 2013-11-08 ENCOUNTER — Ambulatory Visit (INDEPENDENT_AMBULATORY_CARE_PROVIDER_SITE_OTHER)
Admission: RE | Admit: 2013-11-08 | Discharge: 2013-11-08 | Disposition: A | Discharge status: Home / Self Care | Payer: Medicare Other | Source: Ambulatory Visit | Attending: Internal Medicine | Admitting: Internal Medicine

## 2013-11-08 ENCOUNTER — Encounter: Payer: Self-pay | Admitting: Internal Medicine

## 2013-11-08 ENCOUNTER — Other Ambulatory Visit (INDEPENDENT_AMBULATORY_CARE_PROVIDER_SITE_OTHER): Payer: Medicare Other

## 2013-11-08 VITALS — BP 140/80 | HR 76 | Temp 98.0°F | Ht 74.0 in | Wt 229.0 lb

## 2013-11-08 DIAGNOSIS — R0609 Other forms of dyspnea: Secondary | ICD-10-CM

## 2013-11-08 DIAGNOSIS — R0989 Other specified symptoms and signs involving the circulatory and respiratory systems: Secondary | ICD-10-CM

## 2013-11-08 DIAGNOSIS — R079 Chest pain, unspecified: Secondary | ICD-10-CM

## 2013-11-08 DIAGNOSIS — R0600 Dyspnea, unspecified: Secondary | ICD-10-CM

## 2013-11-08 DIAGNOSIS — J9610 Chronic respiratory failure, unspecified whether with hypoxia or hypercapnia: Secondary | ICD-10-CM

## 2013-11-08 DIAGNOSIS — R918 Other nonspecific abnormal finding of lung field: Secondary | ICD-10-CM

## 2013-11-08 DIAGNOSIS — R222 Localized swelling, mass and lump, trunk: Secondary | ICD-10-CM

## 2013-11-08 LAB — BASIC METABOLIC PANEL
BUN: 13 mg/dL (ref 6–23)
CALCIUM: 9.5 mg/dL (ref 8.4–10.5)
CO2: 30 mEq/L (ref 19–32)
CREATININE: 0.8 mg/dL (ref 0.4–1.5)
Chloride: 101 mEq/L (ref 96–112)
GFR: 99.69 mL/min (ref >60.00)
Glucose, Bld: 103 mg/dL — ABNORMAL HIGH (ref 70–99)
Potassium: 4.1 mEq/L (ref 3.5–5.1)
Sodium: 139 mEq/L (ref 135–145)

## 2013-11-08 LAB — CBC WITH DIFFERENTIAL/PLATELET
Basophils Absolute: 0 10*3/uL (ref 0.0–0.1)
Basophils Relative: 0.4 % (ref 0.0–3.0)
EOS PCT: 0.8 % (ref 0.0–5.0)
Eosinophils Absolute: 0.1 10*3/uL (ref 0.0–0.7)
HCT: 39.9 % (ref 39.0–52.0)
Hemoglobin: 13.8 g/dL (ref 13.0–17.0)
Lymphocytes Relative: 14.1 % (ref 12.0–46.0)
Lymphs Abs: 1.3 10*3/uL (ref 0.7–4.0)
MCHC: 34.6 g/dL (ref 30.0–36.0)
MCV: 91.6 fl (ref 78.0–100.0)
Monocytes Absolute: 0.6 10*3/uL (ref 0.1–1.0)
Monocytes Relative: 6.8 % (ref 3.0–12.0)
NEUTROS PCT: 77.9 % — AB (ref 43.0–77.0)
Neutro Abs: 7 10*3/uL (ref 1.4–7.7)
Platelets: 213 10*3/uL (ref 150.0–400.0)
RBC: 4.36 Mil/uL (ref 4.22–5.81)
RDW: 13.5 % (ref 11.5–14.6)
WBC: 8.9 10*3/uL (ref 4.5–10.5)

## 2013-11-08 LAB — SEDIMENTATION RATE: SED RATE: 54 mm/h — AB (ref 0–22)

## 2013-11-08 LAB — TSH: TSH: 0.69 u[IU]/mL (ref 0.35–5.50)

## 2013-11-08 LAB — BRAIN NATRIURETIC PEPTIDE: Pro B Natriuretic peptide (BNP): 207 pg/mL — ABNORMAL HIGH (ref 0.0–100.0)

## 2013-11-08 LAB — HEPATIC FUNCTION PANEL
ALT: 19 U/L (ref 0–53)
AST: 20 U/L (ref 0–37)
Albumin: 4 g/dL (ref 3.5–5.2)
Alkaline Phosphatase: 118 U/L — ABNORMAL HIGH (ref 39–117)
BILIRUBIN DIRECT: 0.1 mg/dL (ref 0.0–0.3)
Total Bilirubin: 0.6 mg/dL (ref 0.3–1.2)
Total Protein: 7.6 g/dL (ref 6.0–8.3)

## 2013-11-08 LAB — LIPASE: Lipase: 15 U/L (ref 11.0–59.0)

## 2013-11-08 LAB — AMYLASE: Amylase: 38 U/L (ref 27–131)

## 2013-11-08 MED ORDER — AMOXICILLIN-POT CLAVULANATE 875-125 MG PO TABS: 1.0000 | ORAL_TABLET | Freq: Two times a day (BID) | ORAL | Status: DC

## 2013-11-08 NOTE — Patient Instructions (Addendum)
Augmentin 875 mg take one pill twice daily  X 10 days - take at breakfast and supper with large glass of water.  It would help reduce the usual side effects (diarrhea and yeast infections) if you ate cultured yogurt at lunch.   GERD (REFLUX)  is an extremely common cause of respiratory symptoms, many times with no significant heartburn at all.    It can be treated with medication, but also with lifestyle changes including avoidance of late meals, excessive alcohol, smoking cessation, and avoid fatty foods, chocolate, peppermint, colas, red wine, and acidic juices such as orange juice.  NO MINT OR MENTHOL PRODUCTS SO NO COUGH DROPS  USE SUGARLESS CANDY INSTEAD (jolley ranchers or Stover's)  NO OIL BASED VITAMINS - use powdered substitutes.    Please schedule a follow up office visit in 2 weeks, sooner if needed with Dr Lamonte Sakai with all active medication  - next step is CT chest CT

## 2013-11-08 NOTE — Progress Notes (Signed)
Subjective:    Patient ID: Tyler [name], male    DOB: 1941/07/29 .   MRN: [id number]   .History of Present Illness  73 yo former heavy smoker, hx HTN, DM, allergies. Has been dx with COPD about 2 weeks ago, started on Advair but only uses it about once a week. Has been rx for PNA in the past, including as a child. Was referred for abnormal CXR and CT scan chest by Dr Rex Kras. He was well until 2 months ago when he developed gastroenteritis w Nausea, diarrhea. Finally got better but he has had poor appetite, has lost about 20 lbs over 2 months. May be a bit better now. Part of the eval for his wt loss, especially w smoking hx, was CXR and then CT scan. His eval revealed erythrocytosis, some renal insufficiency. The CT scan shows a L hilar area that looks like scar vs bronchiectasis.   ROV 06/29/12 -- follow up visit following his FOB/EBUS and then subsequent mediastinoscopy. The pathology was negative for malignancy. He is still having nausea, bitter taste in his mouth despite omeprazole tid. He is on Advair, restarted it a month ago. Seems to be helping his breathing.   08/30/2012 acute w/in ov / Lashawn Orrego cc new sob since 10/27 ok at rest, assoc with new ant pleuritic and positional L CP > ct chest 08/29/12 1. Increase in size of the spiculated lesion noted previously in  the left suprahilar region with narrowing of the bronchus to the  superior segment of the left lower lobe and a new nodular opacity  within the left lower lobe. These findings are highly suspicious  for primary lung carcinoma with metastatic involvement of the left  lower lobe.  2. No increase in mediastinal or hilar adenopathy is seen.  3. Centrilobular emphysema.  4. Moderate sized hiatal hernia  ROV 09/13/12 -- COPD, L hilar mass that has been negative on EBUS and mediastinoscopy. Now with more distal nodular LLL opacity, ? Post-obstructive process vs malignancy. He saw Dr Melvyn Novas 10/31 and started symbicort >. Feels that it has  helped some. He has started hospice services through Dr Rex Kras, ostensibly for lung cancer.  >referred to [facility] , repeat serial CT in 6 months   Acute OV 11/01/12 Complains of left lung pain, increased SOB, occ prod cough with white mucus x1 week - states had 3 sharp pains at onset and felt like the lung had collapsed. Cough is getting worse. No hemoptysis, fever or edema.  No otc used.  CXR today shows no acute process.   Did have hospital admit 3 weeks ago with delirium felt secondary to polypharmacy.  Improved with no recurrent symptoms   ROV 11/29/12 -- COPD, L hilar mass that has been negative on EBUS x 2, also mediastinoscopy. 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 Hospice now stopped. He was recently rx for a bronchitis 1/14 as above. Plan has been for repeat CT scan in April 2014 with plan to reconsider bx at that time. He tells me that he is feeling OK, he does have some L sided CP, can happen at random, doesn't last long, minutes. Freq cough productive of white phlegm. He quit Symbicort - didn't think it was helping him.    11/08/2013 acute  ov/Quintasha Gren re:  Chief Complaint  Patient presents with  . Acute Visit    Pt c/o "chest and lung pain" for the past month. He also states  having increased SOB and poor appetite for the past several wks.   chronic but variable CP always ant R worse than ant  L esp in am waxes and wanes s pattern, no worse with cough x weeks to months 02 started one month prior to OV  By Dr Rex Kras  Mucus brown today only  Sweats on methadone but no fever, some nausea, no vomiting   No obvious patterns in  day to day or daytime variabilty or assoc    chest tightness, subjective wheeze overt sinus or hb symptoms. No unusual exp hx or h/o childhood pna/ asthma or knowledge of premature birth.  Sleeping ok without nocturnal  or early am exacerbation  of respiratory  c/o's or need for noct saba. Also denies any  obvious fluctuation of symptoms with weather or environmental changes or other aggravating or alleviating factors except as outlined above   Current Medications, Allergies, Complete Past Medical History, Past Surgical History, Family History, and Social History were reviewed in [employer] record.  ROS  The following are not active complaints unless bolded sore throat, dysphagia, dental problems, itching, sneezing,  nasal congestion or excess/ purulent secretions, ear ache,   fever, chills, sweats, unintended wt loss,   exertional cp, hemoptysis,  orthopnea pnd or leg swelling, presyncope, palpitations, heartburn, abdominal pain, anorexia, nausea, vomiting, diarrhea  or change in bowel or urinary habits, change in stools or urine, dysuria,hematuria,  rash, arthralgias, visual complaints, headache, numbness weakness or ataxia or problems with walking or coordination,  change in mood/affect or memory.        Objective:   Physical Exam   Wt Readings from Last 3 Encounters:  11/08/13 229 lb (103.874 kg)  11/29/12 234 lb 6.4 oz (106.323 kg)  11/01/12 230 lb 6.4 oz (104.509 kg)       Gen: amb wm abn affect >Patient failed to answer a single question asked in a straightforward manner, tending to go off on tangents or answer questions with ambiguous medical terms or diagnoses and seemed aggravated  when asked the same question more than once for clarification.   ENT: No lesions,  mouth clear,  oropharynx clear, no postnasal drip  Neck: No JVD, no TMG, no carotid bruits, mediastinoscopy incision looks good  Lungs: No use of accessory muscles, no dullness to percussion,  Diminshed BS in bases with no cough on insp or exp  Cardiovascular: RRR, heart sounds normal, no murmur or gallops, no peripheral edema  Musculoskeletal: No deformities, no cyanosis or clubbing  Neuro: alert, non focal  Skin: Warm, no lesions or rashes    CXR  11/08/2013 : Mild hazy density over the  posterior mid lungs on the lateral film  and also over the left base. Cannot exclude infection. Possible  small amount of left pleural fluid.     Labs 11/08/13 ESR 54 cbc, lfts, bmet, tsh all nl -  D dimer 0.73     Assessment & Plan:

## 2013-11-09 DIAGNOSIS — J9610 Chronic respiratory failure, unspecified whether with hypoxia or hypercapnia: Secondary | ICD-10-CM | POA: Insufficient documentation

## 2013-11-09 LAB — D-DIMER, QUANTITATIVE (NOT AT ARMC): D-Dimer, Quant: 0.73 ug/mL-FEU — ABNORMAL HIGH (ref 0.00–0.48)

## 2013-11-09 NOTE — Assessment & Plan Note (Signed)
Was due previously for f/u CT Chest and cxr is def abn but will rx with augmentin x 10 days acutely then ask him to regroup with Dr Lamonte Sakai in 2 weeks for f/u

## 2013-11-09 NOTE — Assessment & Plan Note (Signed)
Labs/ cxr reviewed - his d dimer is the only abn and only minimally elevated which for his age and co-morbids strongly argues against PE  Will rx as pna ? Asp as assoc with nausea but not overt vomiting or GERD and have him return to regoup with Dr Lamonte Sakai

## 2013-11-09 NOTE — Assessment & Plan Note (Signed)
Chronic pattern already eval by cards > consider repeat CT next ov p rx with augmentin.

## 2013-11-09 NOTE — Assessment & Plan Note (Signed)
11/08/2013   Walked 2lpm x one lap @ 185 stopped due to  Legs got weak / fatigue, sats 94% no sob

## 2013-11-11 NOTE — Progress Notes (Signed)
Quick Note:  Spoke with pt and notified of results per Dr. Wert. Pt verbalized understanding and denied any questions.  ______ 

## 2013-11-13 ENCOUNTER — Ambulatory Visit
Admission: RE | Admit: 2013-11-13 | Discharge: 2013-11-13 | Disposition: A | Discharge status: Home / Self Care | Payer: Medicare Other | Source: Ambulatory Visit | Attending: Family Medicine | Admitting: Family Medicine

## 2013-11-13 ENCOUNTER — Other Ambulatory Visit: Payer: Self-pay | Admitting: Family Medicine

## 2013-11-13 DIAGNOSIS — R634 Abnormal weight loss: Secondary | ICD-10-CM

## 2013-11-13 DIAGNOSIS — K5900 Constipation, unspecified: Secondary | ICD-10-CM

## 2013-11-22 ENCOUNTER — Ambulatory Visit (INDEPENDENT_AMBULATORY_CARE_PROVIDER_SITE_OTHER): Payer: Medicare Other | Admitting: Emergency Medicine

## 2013-11-22 ENCOUNTER — Encounter: Payer: Self-pay | Admitting: Emergency Medicine

## 2013-11-22 VITALS — BP 118/80 | HR 69 | Ht 74.0 in | Wt 222.0 lb

## 2013-11-22 DIAGNOSIS — R222 Localized swelling, mass and lump, trunk: Secondary | ICD-10-CM

## 2013-11-22 DIAGNOSIS — J449 Chronic obstructive pulmonary disease, unspecified: Secondary | ICD-10-CM

## 2013-11-22 DIAGNOSIS — R918 Other nonspecific abnormal finding of lung field: Secondary | ICD-10-CM

## 2013-11-22 NOTE — Assessment & Plan Note (Signed)
Needs to be followed with CT scan although he is clear that he may not want any further work up.  - CT scan in May 2015 (1 year interval)

## 2013-11-22 NOTE — Patient Instructions (Signed)
We will repeat your CT scan of the chest in May 2015 to compare with your priors.  We will not start any breathing medications at this time. We should reconsider this in the future.  Continue to wear your oxygen Follow with Dr Lamonte Sakai in May after your CT scan is done.

## 2013-11-22 NOTE — Assessment & Plan Note (Signed)
Discussed BD's w him > he has limiting dyspnea and would likely benefit although not sure about his ability to take the medication effectively.  Offered to him but he does not want to start it. He is willing to wear O2

## 2013-11-22 NOTE — Progress Notes (Signed)
Subjective:    Patient ID: Tyler [name], male    DOB: [birth year], 73 y.o.   MRN: [id number] HPI 73 yo former heavy smoker, hx HTN, DM, allergies. Has been dx with COPD about 2 weeks ago, started on Advair but only uses it about once a week. Has been rx for PNA in the past, including as a child. Was referred for abnormal CXR and CT scan chest by Dr Rex Kras. He was well until 2 months ago when he developed gastroenteritis w Nausea, diarrhea. Finally got better but he has had poor appetite, has lost about 20 lbs over 2 months. May be a bit better now. Part of the eval for his wt loss, especially w smoking hx, was CXR and then CT scan. His eval revealed erythrocytosis, some renal insufficiency. The CT scan shows a L hilar area that looks like scar vs bronchiectasis.   ROV 06/29/12 -- follow up visit following his FOB/EBUS and then subsequent mediastinoscopy. The pathology was negative for malignancy. He is still having nausea, bitter taste in his mouth despite omeprazole tid. He is on Advair, restarted it a month ago. Seems to be helping his breathing.   08/30/2012 acute w/in ov / Wert cc new sob since 10/27 ok at rest, assoc with new ant pleuritic and positional L CP > ct chest 08/29/12 1. Increase in size of the spiculated lesion noted previously in  the left suprahilar region with narrowing of the bronchus to the  superior segment of the left lower lobe and a new nodular opacity  within the left lower lobe. These findings are highly suspicious  for primary lung carcinoma with metastatic involvement of the left  lower lobe.  2. No increase in mediastinal or hilar adenopathy is seen.  3. Centrilobular emphysema.  4. Moderate sized hiatal hernia  ROV 09/13/12 -- COPD, L hilar mass that has been negative on EBUS and mediastinoscopy. Now with more distal nodular LLL opacity, ? Post-obstructive process vs malignancy. He saw Dr Melvyn Novas 10/31 and started symbicort >. Feels that it has helped some. He has  started hospice services through Dr Rex Kras, ostensibly for lung cancer.  >referred to [facility] , repeat serial CT in 6 months   Acute OV 11/01/12 Complains of left lung pain, increased SOB, occ prod cough with white mucus x1 week - states had 3 sharp pains at onset and felt like the lung had collapsed. Cough is getting worse. No hemoptysis, fever or edema.  No otc used.  CXR today shows no acute process.   Did have hospital admit 3 weeks ago with delirium felt secondary to polypharmacy.  Improved with no recurrent symptoms   ROV 11/29/12 -- COPD, L hilar mass that has been negative on EBUS x 2, also mediastinoscopy. 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 Hospice now stopped. He was recently rx for a bronchitis 1/14 as above. Plan has been for repeat CT scan in April 2014 with plan to reconsider bx at that time. He tells me that he is feeling OK, he does have some L sided CP, can happen at random, doesn't last long, minutes. Freq cough productive of white phlegm. He quit Symbicort - didn't think it was helping him.   ROV 11/22/13 -- COPD, L hilar mass that has been negative on EBUS x 2, as well as mediastinoscopy. He has chronic CP that has been evaluated by cardiology w a reassuring w/u. He was seen by Dr  Wert 1/9 for more chest discomfort, cough, dyspnea. Not currently on BD's. He was treated 1/9 with Augmentin given hx LLL process (? Acute on chronic) on CXR. He can't remember taking it (his wife does remember it). He feels better today, mainly because he is no longer constipated or nauseated. His cough is unchanged. He is having sweats x the last year.    Objective:   Physical Exam Filed Vitals:   11/22/13 0949  BP: 118/80  Pulse: 69  Height: 6\' 2"  (1.88 m)  Weight: 222 lb (100.699 kg)  SpO2: 95%    Gen: Pleasant, well-nourished, in no distress,  normal affect  ENT: No lesions,  mouth clear,  oropharynx clear, no postnasal  drip  Neck: No JVD, no TMG, no carotid bruits, mediastinoscopy incision looks good  Lungs: No use of accessory muscles, no dullness to percussion,  Diminshed BS in bases   Cardiovascular: RRR, heart sounds normal, no murmur or gallops, no peripheral edema  Musculoskeletal: No deformities, no cyanosis or clubbing  Neuro: alert, non focal  Skin: Warm, no lesions or rashes    Assessment & Plan:   COPD (chronic obstructive pulmonary disease) Discussed BD's w him > he has limiting dyspnea and would likely benefit although not sure about his ability to take the medication effectively.  Offered to him but he does not want to start it. He is willing to wear O2  Lung mass Needs to be followed with CT scan although he is clear that he may not want any further work up.  - CT scan in May 2015 (1 year interval)

## 2013-11-22 NOTE — Addendum Note (Signed)
Addended by: Horatio Pel on: 11/22/2013 10:16 AM   Modules accepted: Orders

## 2014-03-25 ENCOUNTER — Ambulatory Visit (INDEPENDENT_AMBULATORY_CARE_PROVIDER_SITE_OTHER)
Admission: RE | Admit: 2014-03-25 | Discharge: 2014-03-25 | Disposition: A | Discharge status: Home / Self Care | Payer: Medicare Other | Source: Ambulatory Visit | Attending: Emergency Medicine | Admitting: Emergency Medicine

## 2014-03-25 DIAGNOSIS — R222 Localized swelling, mass and lump, trunk: Secondary | ICD-10-CM

## 2014-03-25 DIAGNOSIS — J449 Chronic obstructive pulmonary disease, unspecified: Secondary | ICD-10-CM

## 2014-03-25 DIAGNOSIS — R918 Other nonspecific abnormal finding of lung field: Secondary | ICD-10-CM

## 2014-04-08 ENCOUNTER — Encounter: Payer: Self-pay | Admitting: Emergency Medicine

## 2014-04-08 ENCOUNTER — Ambulatory Visit (INDEPENDENT_AMBULATORY_CARE_PROVIDER_SITE_OTHER): Payer: Medicare Other | Admitting: Emergency Medicine

## 2014-04-08 VITALS — BP 140/94 | HR 58 | Ht 74.0 in | Wt 223.0 lb

## 2014-04-08 DIAGNOSIS — R222 Localized swelling, mass and lump, trunk: Secondary | ICD-10-CM

## 2014-04-08 DIAGNOSIS — R918 Other nonspecific abnormal finding of lung field: Secondary | ICD-10-CM

## 2014-04-08 DIAGNOSIS — J449 Chronic obstructive pulmonary disease, unspecified: Secondary | ICD-10-CM

## 2014-04-08 MED ORDER — ALBUTEROL SULFATE (2.5 MG/3ML) 0.083% IN NEBU: 2.5000 mg | INHALATION_SOLUTION | Freq: Four times a day (QID) | RESPIRATORY_TRACT | Status: DC | PRN

## 2014-04-08 NOTE — Assessment & Plan Note (Signed)
He has never benefited from BD's but I believe this may be a delivery / compliance issue. Will try albuterol prn via nebulizer, see if he benefits.

## 2014-04-08 NOTE — Progress Notes (Signed)
Subjective:    Patient ID: Tyler [name], male    DOB: 20-Oct-1941, 73 y.o.   MRN: [id number] HPI 73 yo former heavy smoker, hx HTN, DM, allergies. Has been dx with COPD about 2 weeks ago, started on Advair but only uses it about once a week. Has been rx for PNA in the past, including as a child. Was referred for abnormal CXR and CT scan chest by Dr Rex Kras. He was well until 2 months ago when he developed gastroenteritis w Nausea, diarrhea. Finally got better but he has had poor appetite, has lost about 20 lbs over 2 months. May be a bit better now. Part of the eval for his wt loss, especially w smoking hx, was CXR and then CT scan. His eval revealed erythrocytosis, some renal insufficiency. The CT scan shows a L hilar area that looks like scar vs bronchiectasis.   ROV 06/29/12 -- follow up visit following his FOB/EBUS and then subsequent mediastinoscopy. The pathology was negative for malignancy. He is still having nausea, bitter taste in his mouth despite omeprazole tid. He is on Advair, restarted it a month ago. Seems to be helping his breathing.   08/30/2012 acute w/in ov / Wert cc new sob since 10/27 ok at rest, assoc with new ant pleuritic and positional L CP > ct chest 08/29/12 1. Increase in size of the spiculated lesion noted previously in  the left suprahilar region with narrowing of the bronchus to the  superior segment of the left lower lobe and a new nodular opacity  within the left lower lobe. These findings are highly suspicious  for primary lung carcinoma with metastatic involvement of the left  lower lobe.  2. No increase in mediastinal or hilar adenopathy is seen.  3. Centrilobular emphysema.  4. Moderate sized hiatal hernia  ROV 09/13/12 -- COPD, L hilar mass that has been negative on EBUS and mediastinoscopy. Now with more distal nodular LLL opacity, ? Post-obstructive process vs malignancy. He saw Dr Melvyn Novas 10/31 and started symbicort >. Feels that it has helped some. He has  started hospice services through Dr Rex Kras, ostensibly for lung cancer.  >referred to [facility] , repeat serial CT in 6 months   Acute OV 11/01/12 Complains of left lung pain, increased SOB, occ prod cough with white mucus x1 week - states had 3 sharp pains at onset and felt like the lung had collapsed. Cough is getting worse. No hemoptysis, fever or edema.  No otc used.  CXR today shows no acute process.   Did have hospital admit 3 weeks ago with delirium felt secondary to polypharmacy.  Improved with no recurrent symptoms   ROV 11/29/12 -- COPD, L hilar mass that has been negative on EBUS x 2, also mediastinoscopy. 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 Hospice now stopped. He was recently rx for a bronchitis 1/14 as above. Plan has been for repeat CT scan in April 2014 with plan to reconsider bx at that time. He tells me that he is feeling OK, he does have some L sided CP, can happen at random, doesn't last long, minutes. Freq cough productive of white phlegm. He quit Symbicort - didn't think it was helping him.   ROV 11/22/13 -- COPD, L hilar mass that has been negative on EBUS x 2, as well as mediastinoscopy. He has chronic CP that has been evaluated by cardiology w a reassuring w/u. He was seen by Dr  Wert 1/9 for more chest discomfort, cough, dyspnea. Not currently on BD's. He was treated 1/9 with Augmentin given hx LLL process (? Acute on chronic) on CXR. He can't remember taking it (his wife does remember it). He feels better today, mainly because he is no longer constipated or nauseated. His cough is unchanged. He is having sweats x the last year.   ROV 04/08/14 -- follows for COPD and L hilar infiltrate / mass that has been negative on several bx's as above by myself and Dr Servando Snare. He returns today, tells me he feels terrible > nauseated every morning with a bitter taste, dry mouth, he seldom coughs but he can bring up some mucous  from his throat every morning. Usually white, has seen some blood in it.  He has lost some wt, his breathing is a bit worse. He is dyspneic, is not on BD's. He has CT scan 03/25/14 that shows progression as below.    Objective:   Physical Exam Filed Vitals:   04/08/14 0906  BP: 140/94  Pulse: 58  Height: 6\' 2"  (1.88 m)  Weight: 223 lb (101.152 kg)  SpO2: 95%    Gen: Pleasant, well-nourished, in no distress,  normal affect  ENT: No lesions,  mouth clear,  oropharynx clear, no postnasal drip  Neck: No JVD, no TMG, no carotid bruits, mediastinoscopy incision looks good  Lungs: No use of accessory muscles, Diminshed BS in bases, few rhonchi on the L  Cardiovascular: RRR, heart sounds normal, no murmur or gallops, no peripheral edema  Musculoskeletal: No deformities, no cyanosis or clubbing  Neuro: alert, non focal  Skin: Warm, no lesions or rashes     03/25/14 --  COMPARISON: Chest CTs dated 03/22/2013 and 08/29/2012. PET-CT  04/04/2012.  FINDINGS:  Mediastinal lymph nodes are morphologically unchanged from the prior  study, including a precarinal node measuring on image 25. No  progressive adenopathy is demonstrated. The thyroid gland and  thoracic inlet appear normal. There is a stable moderate size hiatal  hernia.  There is stable mild atherosclerosis of the aorta, great vessels and  coronary arteries. The heart size is normal. There is no significant  pericardial effusion.  Emphysema and biapical scarring are stable. The right lung has a  stable appearance. There has been a significant change within the  left lower lobe. The apical segment is now consolidated or replaced  by a mass which measures approximately 6.3 x 5.2 cm transverse.  There is a central low density component measuring approximately 3.1  cm. There is resulting extrinsic narrowing of the left lower lobe  bronchus with increased surrounding soft tissue density. There is  increased left lower lobe volume  loss with bronchiectasis and  central airway thickening more peripherally. There is a small  adjacent left pleural effusion. No significant pleural fluid is  present on the right.  There is no chest wall extension or suspicious osseous finding.  IMPRESSION:  1. Significant change in the left lower lobe with suspected necrotic  mass involving the superior segment. Cavitary pneumonia is  considered less likely. There is extrinsic narrowing of the left  lower lobe bronchus, volume loss and increased left lower lobe  bronchiectasis. Bronchoscopy recommended.  2. The additional lobes appear stable with moderate emphysema.  3. Stable mediastinal lymph nodes.  4. Small left pleural effusion.    Assessment & Plan:   Lung mass Unfortunately there is evidence of progression on his CT scan from 5/26 despite negative bx's in 2013. Need  to repeat his FOB ASAP. He agrees. Will get this scheduled.   COPD (chronic obstructive pulmonary disease) He has never benefited from BD's but I believe this may be a delivery / compliance issue. Will try albuterol prn via nebulizer, see if he benefits.

## 2014-04-08 NOTE — Patient Instructions (Signed)
We will start albuterol via nebulizer up to every 4 hours as needed We will schedule a bronchoscopy as soon as possible to re-evaluate your left lung.  Follow with Dr Lamonte Sakai in 1 month

## 2014-04-08 NOTE — Assessment & Plan Note (Signed)
Unfortunately there is evidence of progression on his CT scan from 5/26 despite negative bx's in 2013. Need to repeat his FOB ASAP. He agrees. Will get this scheduled.

## 2014-04-09 ENCOUNTER — Telehealth: Payer: Self-pay | Admitting: Emergency Medicine

## 2014-04-09 MED ORDER — ALBUTEROL SULFATE (2.5 MG/3ML) 0.083% IN NEBU: 2.5000 mg | INHALATION_SOLUTION | Freq: Four times a day (QID) | RESPIRATORY_TRACT | Status: DC | PRN

## 2014-04-09 NOTE — Telephone Encounter (Signed)
Called and spoke with [facility] from [facility] and she stated that they will not be able to fill these meds for the pt since his insurance does not have the out of network coverage.  Called and spoke with pt and he stated that he would like this sent into [facility] and he will see if this will be covered by the insurance and if not he will call back.

## 2014-04-11 ENCOUNTER — Encounter (HOSPITAL_COMMUNITY)
Admission: RE | Disposition: A | Discharge status: Home / Self Care | Payer: Self-pay | Source: Ambulatory Visit | Attending: Emergency Medicine

## 2014-04-11 ENCOUNTER — Encounter (HOSPITAL_COMMUNITY): Payer: Self-pay | Admitting: Emergency Medicine

## 2014-04-11 ENCOUNTER — Ambulatory Visit (HOSPITAL_COMMUNITY)
Admission: RE | Admit: 2014-04-11 | Discharge: 2014-04-11 | Disposition: A | Discharge status: Home / Self Care | Payer: Medicare Other | Source: Ambulatory Visit | Attending: Emergency Medicine | Admitting: Emergency Medicine

## 2014-04-11 DIAGNOSIS — C3410 Malignant neoplasm of upper lobe, unspecified bronchus or lung: Secondary | ICD-10-CM | POA: Diagnosis not present

## 2014-04-11 DIAGNOSIS — E119 Type 2 diabetes mellitus without complications: Secondary | ICD-10-CM | POA: Diagnosis not present

## 2014-04-11 DIAGNOSIS — R222 Localized swelling, mass and lump, trunk: Secondary | ICD-10-CM | POA: Diagnosis present

## 2014-04-11 DIAGNOSIS — J4489 Other specified chronic obstructive pulmonary disease: Secondary | ICD-10-CM | POA: Insufficient documentation

## 2014-04-11 DIAGNOSIS — J449 Chronic obstructive pulmonary disease, unspecified: Secondary | ICD-10-CM | POA: Insufficient documentation

## 2014-04-11 DIAGNOSIS — R918 Other nonspecific abnormal finding of lung field: Secondary | ICD-10-CM

## 2014-04-11 DIAGNOSIS — Z87891 Personal history of nicotine dependence: Secondary | ICD-10-CM | POA: Insufficient documentation

## 2014-04-11 DIAGNOSIS — I10 Essential (primary) hypertension: Secondary | ICD-10-CM | POA: Diagnosis not present

## 2014-04-11 DIAGNOSIS — C3490 Malignant neoplasm of unspecified part of unspecified bronchus or lung: Secondary | ICD-10-CM

## 2014-04-11 HISTORY — PX: VIDEO BRONCHOSCOPY: SHX5072

## 2014-04-11 HISTORY — DX: Malignant neoplasm of unspecified part of unspecified bronchus or lung: C34.90

## 2014-04-11 SURGERY — VIDEO BRONCHOSCOPY WITHOUT FLUORO
Anesthesia: Moderate Sedation | Laterality: Bilateral

## 2014-04-11 MED ORDER — LIDOCAINE HCL 1 % IJ SOLN
INTRAMUSCULAR | Status: DC | PRN
Start: 2014-04-11 — End: 2014-04-11
  Administered 2014-04-11: 6 mL via RESPIRATORY_TRACT

## 2014-04-11 MED ORDER — LIDOCAINE HCL 2 % EX GEL: Freq: Once | CUTANEOUS | Status: DC

## 2014-04-11 MED ORDER — SODIUM CHLORIDE 0.9 % IV SOLN
INTRAVENOUS | Status: DC
  Administered 2014-04-11: 07:00:00 via INTRAVENOUS

## 2014-04-11 MED ORDER — MIDAZOLAM HCL 10 MG/2ML IJ SOLN
INTRAMUSCULAR | Status: AC
  Filled 2014-04-11: qty 4

## 2014-04-11 MED ORDER — PHENYLEPHRINE HCL 0.25 % NA SOLN
NASAL | Status: DC | PRN
  Administered 2014-04-11: 2 via NASAL

## 2014-04-11 MED ORDER — EPINEPHRINE HCL 0.1 MG/ML IJ SOSY
PREFILLED_SYRINGE | INTRAMUSCULAR | Status: DC | PRN
  Administered 2014-04-11: 1 via ENDOTRACHEOPULMONARY

## 2014-04-11 MED ORDER — PHENYLEPHRINE HCL 0.25 % NA SOLN: 1.0000 | Freq: Four times a day (QID) | NASAL | Status: DC | PRN

## 2014-04-11 MED ORDER — FENTANYL CITRATE 0.05 MG/ML IJ SOLN
INTRAMUSCULAR | Status: AC
  Filled 2014-04-11: qty 4

## 2014-04-11 MED ORDER — FENTANYL CITRATE 0.05 MG/ML IJ SOLN
INTRAMUSCULAR | Status: DC | PRN
  Administered 2014-04-11: 25 ug via INTRAVENOUS
  Administered 2014-04-11: 50 ug via INTRAVENOUS

## 2014-04-11 MED ORDER — MIDAZOLAM HCL 10 MG/2ML IJ SOLN
INTRAMUSCULAR | Status: DC | PRN
  Administered 2014-04-11 (×3): 2 mg via INTRAVENOUS

## 2014-04-11 MED ORDER — LIDOCAINE HCL 2 % EX GEL
CUTANEOUS | Status: DC | PRN
  Administered 2014-04-11: 1

## 2014-04-11 NOTE — Discharge Instructions (Signed)
Flexible Bronchoscopy, Care After These instructions give you information on caring for yourself after your procedure. Your doctor may also give you more specific instructions. Call your doctor if you have any problems or questions after your procedure. HOME CARE  Do not eat or drink anything for 2 hours after your procedure. If you try to eat or drink before the medicine wears off, food or drink could go into your lungs. You could also burn yourself.  After 2 hours have passed and when you can cough and gag normally, you may eat soft food and drink liquids slowly.  The day after the test, you may eat your normal diet.  You may do your normal activities.  Keep all doctor visits. GET HELP RIGHT AWAY IF:  You get more and more short of breath.  You get lightheaded.  You feel like you are going to pass out (faint).  You have chest pain.  You have new problems that worry you.  You cough up more than a little blood.  You cough up more blood than before. MAKE SURE YOU:  Understand these instructions.  Will watch your condition.  Will get help right away if you are not doing well or get worse. Document Released: 08/14/2009 Document Revised: 08/07/2013 Document Reviewed: 06/21/2013 [facility] Patient Information 2014 [place].  Do not eat or drink until after 1000 Today 04/11/2014  Please call our office for any problems or questions. [phone].

## 2014-04-11 NOTE — Interval H&P Note (Signed)
PCCM Interval Note  S: presents for FOB and biopsies. No new issues although he is having back and neck pain currently on our bed  Filed Vitals:   04/11/14 0650 04/11/14 0655 04/11/14 0656  BP:  184/91 184/91  Pulse: 62 59 62  Resp: 20 19 17   SpO2: 95% 92% 95%   Plans:  FOB with biopsies L hilar mass. No barriers to proceeding.   Baltazar Apo, MD, PhD 04/11/2014, 7:43 AM [facility] [phone] or if no answer [phone]

## 2014-04-11 NOTE — Progress Notes (Signed)
Video bronchoscopy performed Intervention bronchial brushings Intervention bronchial biopsy Intervention needle biopsy Pt tolerated well  Kathie Dike RRT  Baltazar Apo, MD, PhD 04/12/2014, 1:58 PM  [facility] [phone] or if no answer [phone]

## 2014-04-11 NOTE — Op Note (Signed)
Video Bronchoscopy Procedure Note  Date of Operation: 04/11/2014  Pre-op Diagnosis: L hilar mass  Post-op Diagnosis: Same  Surgeon: Baltazar Apo  Assistants: none  Anesthesia: conscious sedation, moderate sedation  Meds Given: fentanyl 69mcg, versed 6mg  in divided doses, epi 1:10000 dil 10cc total, 1% lidocaine 10cc total  Operation: Flexible video fiberoptic bronchoscopy and biopsies.  Estimated Blood Loss: 67EH  Complications: none noted  Indications and History: Tyler [name] is 73 y.o. with history of a L hilar mass. This has had negative biopsies in the past by EBUS. On most recent CT scan the L lesion was larger with a post-obstructive component.  Recommendation was to perform video fiberoptic bronchoscopy with biopsies. The risks, benefits, complications, treatment options and expected outcomes were discussed with the patient.  The possibilities of pneumothorax, pneumonia, reaction to medication, pulmonary aspiration, perforation of a viscus, bleeding, failure to diagnose a condition and creating a complication requiring transfusion or operation were discussed with the patient who freely signed the consent.    Description of Procedure: The patient was seen in the Preoperative Area, was examined and was deemed appropriate to proceed.  The patient was taken to [facility], identified as [name] and the procedure verified as Flexible Video Fiberoptic Bronchoscopy.  A Time Out was held and the above information confirmed.   Conscious sedation was initiated as indicated above. The video fiberoptic bronchoscope was introduced via the OP and a general inspection was performed which showed normal cords, normal trachea, normal main carina. The R sided airways were inspected and showed normal RUL, BI, RML and RLL. The L side was then inspected. Thre was an endobronchial lesion at the LUL carina that narrowed the airways to both the LUL and LLL. In particular the LLL  orifice was affected. 1:10000 epi was injected onto the lesion. Wang needle bx, endobronchial brushings, endobronchial forceps biopsies were performed. There was some initial moderate bleeding that stopped quickly. The patient tolerated the procedure well. The bronchoscope was removed. There were no obvious complications.   Samples: 1. Endobronchial brushings from LUL carinal lesion 2. Wang needle biopsy from LUL carinal lesion 3. Endobronchial forceps biopsies from LUL carinal lesion  Plans:  We will review the cytology, pathology results with the patient when they become available.  Outpatient followup will be with Dr Lamonte Sakai.    Baltazar Apo, MD, PhD 04/11/2014, 8:16 AM [facility] [phone] or if no answer [phone]

## 2014-04-11 NOTE — H&P (View-Only) (Signed)
Subjective:    Patient ID: Tyler [name], male    DOB: June 05, 1941, 73 y.o.   MRN: [id number] HPI 73 yo former heavy smoker, hx HTN, DM, allergies. Has been dx with COPD about 2 weeks ago, started on Advair but only uses it about once a week. Has been rx for PNA in the past, including as a child. Was referred for abnormal CXR and CT scan chest by Dr Rex Kras. He was well until 2 months ago when he developed gastroenteritis w Nausea, diarrhea. Finally got better but he has had poor appetite, has lost about 20 lbs over 2 months. May be a bit better now. Part of the eval for his wt loss, especially w smoking hx, was CXR and then CT scan. His eval revealed erythrocytosis, some renal insufficiency. The CT scan shows a L hilar area that looks like scar vs bronchiectasis.   ROV 06/29/12 -- follow up visit following his FOB/EBUS and then subsequent mediastinoscopy. The pathology was negative for malignancy. He is still having nausea, bitter taste in his mouth despite omeprazole tid. He is on Advair, restarted it a month ago. Seems to be helping his breathing.   08/30/2012 acute w/in ov / Wert cc new sob since 10/27 ok at rest, assoc with new ant pleuritic and positional L CP > ct chest 08/29/12 1. Increase in size of the spiculated lesion noted previously in  the left suprahilar region with narrowing of the bronchus to the  superior segment of the left lower lobe and a new nodular opacity  within the left lower lobe. These findings are highly suspicious  for primary lung carcinoma with metastatic involvement of the left  lower lobe.  2. No increase in mediastinal or hilar adenopathy is seen.  3. Centrilobular emphysema.  4. Moderate sized hiatal hernia  ROV 09/13/12 -- COPD, L hilar mass that has been negative on EBUS and mediastinoscopy. Now with more distal nodular LLL opacity, ? Post-obstructive process vs malignancy. He saw Dr Melvyn Novas 10/31 and started symbicort >. Feels that it has helped some. He has  started hospice services through Dr Rex Kras, ostensibly for lung cancer.  >referred to [facility] , repeat serial CT in 6 months   Acute OV 11/01/12 Complains of left lung pain, increased SOB, occ prod cough with white mucus x1 week - states had 3 sharp pains at onset and felt like the lung had collapsed. Cough is getting worse. No hemoptysis, fever or edema.  No otc used.  CXR today shows no acute process.   Did have hospital admit 3 weeks ago with delirium felt secondary to polypharmacy.  Improved with no recurrent symptoms   ROV 11/29/12 -- COPD, L hilar mass that has been negative on EBUS x 2, also mediastinoscopy. 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 Hospice now stopped. He was recently rx for a bronchitis 1/14 as above. Plan has been for repeat CT scan in April 2014 with plan to reconsider bx at that time. He tells me that he is feeling OK, he does have some L sided CP, can happen at random, doesn't last long, minutes. Freq cough productive of white phlegm. He quit Symbicort - didn't think it was helping him.   ROV 11/22/13 -- COPD, L hilar mass that has been negative on EBUS x 2, as well as mediastinoscopy. He has chronic CP that has been evaluated by cardiology w a reassuring w/u. He was seen by Dr  Wert 1/9 for more chest discomfort, cough, dyspnea. Not currently on BD's. He was treated 1/9 with Augmentin given hx LLL process (? Acute on chronic) on CXR. He can't remember taking it (his wife does remember it). He feels better today, mainly because he is no longer constipated or nauseated. His cough is unchanged. He is having sweats x the last year.   ROV 04/08/14 -- follows for COPD and L hilar infiltrate / mass that has been negative on several bx's as above by myself and Dr Servando Snare. He returns today, tells me he feels terrible > nauseated every morning with a bitter taste, dry mouth, he seldom coughs but he can bring up some mucous  from his throat every morning. Usually white, has seen some blood in it.  He has lost some wt, his breathing is a bit worse. He is dyspneic, is not on BD's. He has CT scan 03/25/14 that shows progression as below.    Objective:   Physical Exam Filed Vitals:   04/08/14 0906  BP: 140/94  Pulse: 58  Height: 6\' 2"  (1.88 m)  Weight: 223 lb (101.152 kg)  SpO2: 95%    Gen: Pleasant, well-nourished, in no distress,  normal affect  ENT: No lesions,  mouth clear,  oropharynx clear, no postnasal drip  Neck: No JVD, no TMG, no carotid bruits, mediastinoscopy incision looks good  Lungs: No use of accessory muscles, Diminshed BS in bases, few rhonchi on the L  Cardiovascular: RRR, heart sounds normal, no murmur or gallops, no peripheral edema  Musculoskeletal: No deformities, no cyanosis or clubbing  Neuro: alert, non focal  Skin: Warm, no lesions or rashes     03/25/14 --  COMPARISON: Chest CTs dated 03/22/2013 and 08/29/2012. PET-CT  04/04/2012.  FINDINGS:  Mediastinal lymph nodes are morphologically unchanged from the prior  study, including a precarinal node measuring on image 25. No  progressive adenopathy is demonstrated. The thyroid gland and  thoracic inlet appear normal. There is a stable moderate size hiatal  hernia.  There is stable mild atherosclerosis of the aorta, great vessels and  coronary arteries. The heart size is normal. There is no significant  pericardial effusion.  Emphysema and biapical scarring are stable. The right lung has a  stable appearance. There has been a significant change within the  left lower lobe. The apical segment is now consolidated or replaced  by a mass which measures approximately 6.3 x 5.2 cm transverse.  There is a central low density component measuring approximately 3.1  cm. There is resulting extrinsic narrowing of the left lower lobe  bronchus with increased surrounding soft tissue density. There is  increased left lower lobe volume  loss with bronchiectasis and  central airway thickening more peripherally. There is a small  adjacent left pleural effusion. No significant pleural fluid is  present on the right.  There is no chest wall extension or suspicious osseous finding.  IMPRESSION:  1. Significant change in the left lower lobe with suspected necrotic  mass involving the superior segment. Cavitary pneumonia is  considered less likely. There is extrinsic narrowing of the left  lower lobe bronchus, volume loss and increased left lower lobe  bronchiectasis. Bronchoscopy recommended.  2. The additional lobes appear stable with moderate emphysema.  3. Stable mediastinal lymph nodes.  4. Small left pleural effusion.    Assessment & Plan:   Lung mass Unfortunately there is evidence of progression on his CT scan from 5/26 despite negative bx's in 2013. Need  to repeat his FOB ASAP. He agrees. Will get this scheduled.   COPD (chronic obstructive pulmonary disease) He has never benefited from BD's but I believe this may be a delivery / compliance issue. Will try albuterol prn via nebulizer, see if he benefits.

## 2014-04-14 ENCOUNTER — Encounter (HOSPITAL_COMMUNITY): Payer: Self-pay | Admitting: Emergency Medicine

## 2014-04-14 ENCOUNTER — Telehealth: Payer: Self-pay | Admitting: Emergency Medicine

## 2014-04-14 MED ORDER — ALBUTEROL SULFATE (2.5 MG/3ML) 0.083% IN NEBU: 2.5000 mg | INHALATION_SOLUTION | Freq: Four times a day (QID) | RESPIRATORY_TRACT | Status: DC | PRN

## 2014-04-14 NOTE — Telephone Encounter (Signed)
Sent rx for albuterol neb to [facility]. Pt aware and nothing further is needed

## 2014-04-16 ENCOUNTER — Telehealth: Payer: Self-pay | Admitting: Emergency Medicine

## 2014-04-16 ENCOUNTER — Telehealth: Payer: Self-pay | Admitting: *Deleted

## 2014-04-16 DIAGNOSIS — R918 Other nonspecific abnormal finding of lung field: Secondary | ICD-10-CM

## 2014-04-16 NOTE — Telephone Encounter (Signed)
Discussed bx results with him - adenoCA. Explained possible rx options. He agrees to go to [facility] vs Thoracic Onc Dr Julien Nordmann to discuss therapy.   Also > still coughing up old blood post-FOB, sometimes new blood depending on the depth of his cough. I asked him to call me if this worsens, changes in any way.

## 2014-04-16 NOTE — Telephone Encounter (Signed)
Pt calling regard results. Please advise RB thanks

## 2014-04-16 NOTE — Telephone Encounter (Signed)
Called pt to set up appt with Dr. Julien Nordmann 04/23/14 at 2:00 labs and 2:15 with Dr. Julien Nordmann.  He verbalized understanding of appt time and place.

## 2014-04-17 ENCOUNTER — Telehealth: Payer: Self-pay | Admitting: Emergency Medicine

## 2014-04-17 DIAGNOSIS — C3490 Malignant neoplasm of unspecified part of unspecified bronchus or lung: Secondary | ICD-10-CM

## 2014-04-17 NOTE — Telephone Encounter (Signed)
Please advise RB thanks 

## 2014-04-17 NOTE — Telephone Encounter (Signed)
LMTCB for Danna at [facility] to advise. [place] Bing, CMA

## 2014-04-17 NOTE — Telephone Encounter (Signed)
Get him on the schedule now. i will order the studies. He needs to be seen whether the PET and MRI are done or not. He is only a chemo / XRT candidate, no sgy

## 2014-04-18 NOTE — Telephone Encounter (Signed)
ATC Danna at [facility] but line just rang several times and no VM WCB

## 2014-04-18 NOTE — Telephone Encounter (Signed)
called Danna and LMTCB x2

## 2014-04-21 NOTE — Telephone Encounter (Signed)
Looks like pt already scheduled in epic. Nothing further needed

## 2014-04-22 ENCOUNTER — Telehealth: Payer: Self-pay | Admitting: Emergency Medicine

## 2014-04-22 NOTE — Telephone Encounter (Signed)
Thank you :)

## 2014-04-22 NOTE — Telephone Encounter (Signed)
I called kim and LMTCB as she stepped away.

## 2014-04-22 NOTE — Telephone Encounter (Signed)
Kim returning call.Hillery Hunter

## 2014-04-22 NOTE — Telephone Encounter (Signed)
Spoke with Tyler [name]  She states that RB had wanted them to send bronch specimen for [facility] testing  They sent the specimen, but it was not enough material to test so this could not be completed  Will forward to RB as FYI

## 2014-04-23 ENCOUNTER — Other Ambulatory Visit (HOSPITAL_BASED_OUTPATIENT_CLINIC_OR_DEPARTMENT_OTHER): Payer: Medicare Other

## 2014-04-23 ENCOUNTER — Other Ambulatory Visit: Payer: Self-pay | Admitting: Internal Medicine

## 2014-04-23 ENCOUNTER — Encounter: Payer: Self-pay | Admitting: Internal Medicine

## 2014-04-23 ENCOUNTER — Ambulatory Visit (HOSPITAL_BASED_OUTPATIENT_CLINIC_OR_DEPARTMENT_OTHER): Payer: Medicare Other | Admitting: Internal Medicine

## 2014-04-23 VITALS — BP 148/65 | HR 54 | Temp 97.3°F | Resp 18 | Ht 74.0 in | Wt 223.0 lb

## 2014-04-23 DIAGNOSIS — J449 Chronic obstructive pulmonary disease, unspecified: Secondary | ICD-10-CM

## 2014-04-23 DIAGNOSIS — C3490 Malignant neoplasm of unspecified part of unspecified bronchus or lung: Secondary | ICD-10-CM | POA: Insufficient documentation

## 2014-04-23 DIAGNOSIS — R918 Other nonspecific abnormal finding of lung field: Secondary | ICD-10-CM

## 2014-04-23 DIAGNOSIS — C3430 Malignant neoplasm of lower lobe, unspecified bronchus or lung: Secondary | ICD-10-CM

## 2014-04-23 DIAGNOSIS — C3432 Malignant neoplasm of lower lobe, left bronchus or lung: Secondary | ICD-10-CM

## 2014-04-23 DIAGNOSIS — J4489 Other specified chronic obstructive pulmonary disease: Secondary | ICD-10-CM

## 2014-04-23 DIAGNOSIS — I10 Essential (primary) hypertension: Secondary | ICD-10-CM

## 2014-04-23 DIAGNOSIS — C3402 Malignant neoplasm of left main bronchus: Secondary | ICD-10-CM

## 2014-04-23 LAB — COMPREHENSIVE METABOLIC PANEL (CC13)
ALK PHOS: 118 U/L (ref 40–150)
ALT: 10 U/L (ref 0–55)
AST: 12 U/L (ref 5–34)
Albumin: 3.3 g/dL — ABNORMAL LOW (ref 3.5–5.0)
Anion Gap: 8 mEq/L (ref 3–11)
BUN: 21.6 mg/dL (ref 7.0–26.0)
CO2: 28 mEq/L (ref 22–29)
Calcium: 9.5 mg/dL (ref 8.4–10.4)
Chloride: 103 mEq/L (ref 98–109)
Creatinine: 1 mg/dL (ref 0.7–1.3)
GLUCOSE: 156 mg/dL — AB (ref 70–140)
Potassium: 4.1 mEq/L (ref 3.5–5.1)
Sodium: 139 mEq/L (ref 136–145)
TOTAL PROTEIN: 7.2 g/dL (ref 6.4–8.3)
Total Bilirubin: 0.23 mg/dL (ref 0.20–1.20)

## 2014-04-23 LAB — CBC WITH DIFFERENTIAL/PLATELET
BASO%: 0.5 % (ref 0.0–2.0)
Basophils Absolute: 0 10*3/uL (ref 0.0–0.1)
EOS%: 3.7 % (ref 0.0–7.0)
Eosinophils Absolute: 0.3 10*3/uL (ref 0.0–0.5)
HCT: 35.4 % — ABNORMAL LOW (ref 38.4–49.9)
HGB: 11.7 g/dL — ABNORMAL LOW (ref 13.0–17.1)
LYMPH%: 21.4 % (ref 14.0–49.0)
MCH: 29.6 pg (ref 27.2–33.4)
MCHC: 33.1 g/dL (ref 32.0–36.0)
MCV: 89.6 fL (ref 79.3–98.0)
MONO#: 0.5 10*3/uL (ref 0.1–0.9)
MONO%: 5.7 % (ref 0.0–14.0)
NEUT#: 5.7 10*3/uL (ref 1.5–6.5)
NEUT%: 68.7 % (ref 39.0–75.0)
NRBC: 0 % (ref 0–0)
Platelets: 189 10*3/uL (ref 140–400)
RBC: 3.95 10*6/uL — AB (ref 4.20–5.82)
RDW: 14.3 % (ref 11.0–14.6)
WBC: 8.3 10*3/uL (ref 4.0–10.3)
lymph#: 1.8 10*3/uL (ref 0.9–3.3)

## 2014-04-23 NOTE — Progress Notes (Signed)
[place] Telephone:([phone]) [phone]   [phone]  CONSULT NOTE  REFERRING PHYSICIAN: Dr. Baltazar Apo  REASON FOR CONSULTATION:  73 years old white male recently diagnosed with lung cancer.  HPI Tyler [name] is a 73 y.o. male with past medical history significant for COPD, hypertension and GERD in addition to a long history of heavy smoking but quit 3 years ago. The patient mentions that 2 years ago he was complaining of weight loss and shortness of breath. He had CT scan of the chest performed on 02/07/2012 and it showed an irregular linear opacity within the left upper lobe questionable for scarring and followup CT scan of the chest in 4 months was recommended. That scan on 04/04/2012 showed malignant change FDG uptake was in the bilateral hilar and mediastinal lymph nodes. The spiculated batch within the superior segment of the left lower lobe also exhibit malignant change FDG uptake. The patient underwent flexible fiberoptic bronchoscopy with endobronchial ultrasound and biopsies under the care of Dr. Lamonte Sakai on 06/11/2012. The pathology at that time showed no evidence for malignancy. Repeat bronchoscopy in December of 2013 showed no evidence for malignancy. The patient was followed by observation. Repeat CT scan of the chest without contrast on 04/09/2014 showed significant change in the left lower lobe with suspected necrotic mass involving the superior segment. Cavitary pneumonia is considered less likely. There was extrinsic narrowing of the left lower lobe bronchus. There was also a stable mediastinal lymph nodes and a small left pleural effusion.  He underwent flexible video fiberoptic bronchoscopy with biopsies under the care of Dr. Lamonte Sakai on 04/11/2014. The final pathology (Accession: [id number]) of the endobronchial biopsy of the left upper lobe was consistent with adenocarcinoma.  Dr. Lamonte Sakai kindly referred the patient to me today for further evaluation and  recommendation regarding his condition. The patient is scheduled for a PET scan tomorrow and MRI of the brain on 04/28/2014. When seen today the patient is feeling fine except for occasional nausea especially early in the morning as well as cough productive of blood-tinged sputum. He has significant weight loss over the last 2 years but not recently. He denied having any significant shortness of breath or frank hemoptysis. He denied having any significant headache or visual changes. HPI  Past Medical History  Diagnosis Date  . Allergic rhinitis   . BPH (benign prostatic hyperplasia)   . GERD (gastroesophageal reflux disease)   . COPD (chronic obstructive pulmonary disease)   . Shortness of breath   . Arthritis   . Enlarged prostate   . Hypertension     dr Margreta Journey little in [place]   pcp      dr Stanford Breed  cardiac md  . Mental disorder   . Constipation   . Cancer Lung    Past Surgical History  Procedure Laterality Date  . Ankle surgery  2005  . Prostate surgery  11/25/2010  . Hernia repair    . Mediastinoscopy  06/11/2012    Procedure: MEDIASTINOSCOPY;  Surgeon: Grace Isaac, MD;  Location: [facility];  Service: Thoracic;  Laterality: N/A;  . Inguinal hernia repair      left  . Eye surgery      cataract right  . Video bronchoscopy with endobronchial ultrasound  10/08/2012    Procedure: VIDEO BRONCHOSCOPY WITH ENDOBRONCHIAL ULTRASOUND;  Surgeon: Collene Gobble, MD;  Location: [facility];  Service: Pulmonary;  Laterality: N/A;  . Video bronchoscopy Bilateral 04/11/2014    Procedure: VIDEO BRONCHOSCOPY  WITHOUT FLUORO;  Surgeon: Collene Gobble, MD;  Location: [facility];  Service: Cardiopulmonary;  Laterality: Bilateral;    Family History  Problem Relation Age of Onset  . Heart disease Mother   . Arthritis Sister   . Arthritis Sister   . Liver cancer Father     Social History History  Substance Use Topics  . Smoking status: Former Smoker -- 2.00 packs/day for 50 years    Types:  Cigarettes    Quit date: 12/26/2009  . Smokeless tobacco: Never Used  . Alcohol Use: 14.4 oz/week    24 Cans of beer per week     Comment: 4 beers every night    Allergies  Allergen Reactions  . Ketorolac Tromethamine Anaphylaxis    REACTION TO T[place]  . Codeine Itching  . Hydrocod Polst-Cpm Polst Er Rash  . Tramadol Rash  . Tussionex Pennkinetic Er [Hydrocod Polst-Cpm Polst Er] Rash    Current Outpatient Prescriptions  Medication Sig Dispense Refill  . albuterol (PROVENTIL) (2.5 MG/3ML) 0.083% nebulizer solution Take 3 mLs (2.5 mg total) by nebulization every 6 (six) hours as needed for wheezing.  75 mL  3  . flurazepam (DALMANE) 30 MG capsule Take 30 mg by mouth at bedtime as needed for sleep.      . naproxen (NAPROSYN) 500 MG tablet Take 500 mg by mouth 2 (two) times daily with a meal.      . nebivolol (BYSTOLIC) 10 MG tablet Take 10 mg by mouth 2 (two) times daily. 1 tab bid      . omeprazole (PRILOSEC) 20 MG capsule Take 20 mg by mouth 2 (two) times daily before a meal.       . oxyCODONE-acetaminophen (PERCOCET/ROXICET) 5-325 MG per tablet Take 1 tablet by mouth every 4 (four) hours as needed for severe pain.      . Tamsulosin HCl (FLOMAX) 0.4 MG CAPS Take 0.4 mg by mouth every morning.        No current facility-administered medications for this visit.    Review of Systems  Constitutional: positive for anorexia and weight loss Eyes: negative Ears, nose, mouth, throat, and face: negative Respiratory: positive for cough and sputum Cardiovascular: negative Gastrointestinal: negative Genitourinary:negative Integument/breast: negative Hematologic/lymphatic: negative Musculoskeletal:negative Neurological: negative Behavioral/Psych: negative Endocrine: negative Allergic/Immunologic: negative  Physical Exam  PPJ:KDTOI, healthy, no distress, well nourished and well developed SKIN: skin color, texture, turgor are normal, no rashes or significant lesions HEAD:  Normocephalic, No masses, lesions, tenderness or abnormalities EYES: normal, PERRLA EARS: External ears normal, Canals clear OROPHARYNX:no exudate, no erythema and lips, buccal mucosa, and tongue normal  NECK: supple, no adenopathy, no JVD LYMPH:  no palpable lymphadenopathy, no hepatosplenomegaly LUNGS: expiratory wheezes bilaterally, scattered rales bilaterally HEART: regular rate & rhythm, no murmurs and no gallops ABDOMEN:abdomen soft, non-tender, normal bowel sounds and no masses or organomegaly BACK: Back symmetric, no curvature., No CVA tenderness EXTREMITIES:no joint deformities, effusion, or inflammation, no edema, no skin discoloration  NEURO: alert & oriented x 3 with fluent speech, no focal motor/sensory deficits  PERFORMANCE STATUS: ECOG 1  LABORATORY DATA: Lab Results  Component Value Date   WBC 8.3 04/23/2014   HGB 11.7 Repeated and Verified* 04/23/2014   HCT 35.4* 04/23/2014   MCV 89.6 04/23/2014   PLT 189 04/23/2014      Chemistry      Component Value Date/Time   NA 139 04/23/2014 1340   NA 139 11/08/2013 1524   K 4.1 04/23/2014 1340   K 4.1  11/08/2013 1524   CL 101 11/08/2013 1524   CO2 28 04/23/2014 1340   CO2 30 11/08/2013 1524   BUN 21.6 04/23/2014 1340   BUN 13 11/08/2013 1524   CREATININE 1.0 04/23/2014 1340   CREATININE 0.8 11/08/2013 1524      Component Value Date/Time   CALCIUM 9.5 04/23/2014 1340   CALCIUM 9.5 11/08/2013 1524   ALKPHOS 118 04/23/2014 1340   ALKPHOS 118* 11/08/2013 1524   AST 12 04/23/2014 1340   AST 20 11/08/2013 1524   ALT 10 04/23/2014 1340   ALT 19 11/08/2013 1524   BILITOT 0.23 04/23/2014 1340   BILITOT 0.6 11/08/2013 1524       RADIOGRAPHIC STUDIES: Ct Chest Wo Contrast  03/25/2014   CLINICAL DATA:  Worsening shortness of breath and hemoptysis over the past year. History of COPD and lung mass. Mediastinal lymph node biopsy 06/11/2012 negative for malignancy.  EXAM: CT CHEST WITHOUT CONTRAST  TECHNIQUE: Multidetector CT imaging of the chest was  performed following the standard protocol without IV contrast.  COMPARISON:  Chest CTs dated 03/22/2013 and 08/29/2012. PET-CT 04/04/2012.  FINDINGS: Mediastinal lymph nodes are morphologically unchanged from the prior study, including a precarinal node measuring on image 25. No progressive adenopathy is demonstrated. The thyroid gland and thoracic inlet appear normal. There is a stable moderate size hiatal hernia.  There is stable mild atherosclerosis of the aorta, great vessels and coronary arteries. The heart size is normal. There is no significant pericardial effusion.  Emphysema and biapical scarring are stable. The right lung has a stable appearance. There has been a significant change within the left lower lobe. The apical segment is now consolidated or replaced by a mass which measures approximately 6.3 x 5.2 cm transverse. There is a central low density component measuring approximately 3.1 cm. There is resulting extrinsic narrowing of the left lower lobe bronchus with increased surrounding soft tissue density. There is increased left lower lobe volume loss with bronchiectasis and central airway thickening more peripherally. There is a small adjacent left pleural effusion. No significant pleural fluid is present on the right.  There is no chest wall extension or suspicious osseous finding.  IMPRESSION: 1. Significant change in the left lower lobe with suspected necrotic mass involving the superior segment. Cavitary pneumonia is considered less likely. There is extrinsic narrowing of the left lower lobe bronchus, volume loss and increased left lower lobe bronchiectasis. Bronchoscopy recommended. 2. The additional lobes appear stable with moderate emphysema. 3. Stable mediastinal lymph nodes. 4. Small left pleural effusion.   Electronically Signed   By: Camie Patience M.D.   On: 03/25/2014 11:19    ASSESSMENT: This is a very pleasant 73 years old white male recently diagnosed with adenocarcinoma of the lung  suspicious for a stage IIIA presented with large left lower lobe mass and questionable mediastinal lymphadenopathy.    PLAN: I had a lengthy discussion with the patient and his wife today about his current disease stage, prognosis and treatment options. I showed them the images of the CT scan of the chest. I discussed with the patient his treatment options but this would be based on the final staging workup which is currently pending. If he has no evidence for metastatic disease, the patient would be considered for a course of concurrent chemotherapy with weekly carboplatin for AUC of 2 and paclitaxel 45 mg/M2. I referred the patient to radiation oncology for evaluation and discussion of the radiotherapy option. I will arrange for the patient a  followup appointment with me in one week for more detailed discussion of his treatment options after completion of the staging workup. I gave the patient and his wife that time to ask questions and I answered them completely to their satisfaction. The patient was advised to call immediately if he has any concerning symptoms in the interval.  The patient voices understanding of current disease status and treatment options and is in agreement with the current care plan.  All questions were answered. The patient knows to call the clinic with any problems, questions or concerns. We can certainly see the patient much sooner if necessary.  Thank you so much for allowing me to participate in the care of Tyler [name]. I will continue to follow up the patient with you and assist in his care.  I spent 40 minutes counseling the patient face to face. The total time spent in the appointment was 60 minutes.  Disclaimer: This note was dictated with voice recognition software. Similar sounding words can inadvertently be transcribed and may not be corrected upon review.   Tyler [name],Tyler [name] K. 04/23/2014, 3:16 PM

## 2014-04-24 ENCOUNTER — Ambulatory Visit (HOSPITAL_COMMUNITY)
Admission: RE | Admit: 2014-04-24 | Discharge: 2014-04-24 | Disposition: A | Discharge status: Home / Self Care | Payer: Medicare Other | Source: Ambulatory Visit | Attending: Emergency Medicine | Admitting: Emergency Medicine

## 2014-04-24 ENCOUNTER — Other Ambulatory Visit: Payer: Self-pay | Admitting: Emergency Medicine

## 2014-04-24 ENCOUNTER — Encounter (HOSPITAL_COMMUNITY): Payer: Self-pay

## 2014-04-24 ENCOUNTER — Telehealth: Payer: Self-pay | Admitting: *Deleted

## 2014-04-24 DIAGNOSIS — C3490 Malignant neoplasm of unspecified part of unspecified bronchus or lung: Secondary | ICD-10-CM

## 2014-04-24 DIAGNOSIS — C771 Secondary and unspecified malignant neoplasm of intrathoracic lymph nodes: Secondary | ICD-10-CM | POA: Insufficient documentation

## 2014-04-24 DIAGNOSIS — R222 Localized swelling, mass and lump, trunk: Secondary | ICD-10-CM | POA: Insufficient documentation

## 2014-04-24 DIAGNOSIS — K449 Diaphragmatic hernia without obstruction or gangrene: Secondary | ICD-10-CM | POA: Insufficient documentation

## 2014-04-24 DIAGNOSIS — R918 Other nonspecific abnormal finding of lung field: Secondary | ICD-10-CM | POA: Insufficient documentation

## 2014-04-24 LAB — GLUCOSE, CAPILLARY: GLUCOSE-CAPILLARY: 105 mg/dL — AB (ref 70–99)

## 2014-04-24 MED ORDER — FLUDEOXYGLUCOSE F - 18 (FDG) INJECTION
11.3000 | Freq: Once | INTRAVENOUS | Status: AC | PRN
  Administered 2014-04-24: 11.3 via INTRAVENOUS

## 2014-04-24 NOTE — Telephone Encounter (Signed)
Called pt to set up appt for [place] follow up.  05/01/14 at 12:30.  He verbalized understanding of appt time and place.

## 2014-04-28 ENCOUNTER — Encounter: Payer: Self-pay | Admitting: Radiation Oncology

## 2014-04-28 ENCOUNTER — Ambulatory Visit (HOSPITAL_COMMUNITY)
Admission: RE | Admit: 2014-04-28 | Discharge: 2014-04-28 | Disposition: A | Discharge status: Home / Self Care | Payer: Medicare Other | Source: Ambulatory Visit | Attending: Emergency Medicine | Admitting: Emergency Medicine

## 2014-04-28 ENCOUNTER — Telehealth: Payer: Self-pay | Admitting: Emergency Medicine

## 2014-04-28 DIAGNOSIS — C3490 Malignant neoplasm of unspecified part of unspecified bronchus or lung: Secondary | ICD-10-CM

## 2014-04-28 NOTE — Telephone Encounter (Signed)
Called amy LMTCB x1 at ext

## 2014-04-28 NOTE — Progress Notes (Signed)
Thoracic Location of Tumor / Histology: LUL Lung   Patient presented months ago with symptoms of: SOB, weight loss , productive cough,   He  had CT of chest 02/07/12,and again 04/14/12,  Had bronchoscopy at that time with Dr. Lamonte Sakai 06/11/12=no evidence of malignancy, followed by observation, repeat Ct Chest 04/09/14, =necrotic mass bx performed  By Dr.Byrum 04/11/14  Biopsies of  (if applicable) revealed: Diagnosis 04/11/14: Endobronchial biopsy, LUL carina lesion- ADENOCARCINOMA.  Tobacco/Marijuana/Snuff/ETOH use:  Smoked cigarettes 50 years,2ppd, quit 3 years ago, drinks 4 cans beer nightly    Past/Anticipated interventions by cardiothoracic surgery, if any:   Past/Anticipated interventions by medical oncology, if any:Dr.Mohamed seen 04/23/14, Pet scan 04/24/14:Large hiatal hernia the posterior to the right heart.  SKELETON No focal hypermetabolic activity to suggest skeletal metastasis.  IMPRESSION:  1. Hypermetabolic cavitary mass within the superior segment of left lower lobe is consistent lung cancer recurrence. 2. Hypermetabolic pulmonary nodules peripheral to this mass is concerning for lymphangitic spread of tumor. 3. Hypermetabolic contralateral and ipsilateral mediastinal nodal metastasis. 4. No evidence of more distant metastasis.  MRI scheduled 04/28/14,@ 5pm ; Follow up appt with Dr.Mohamed 05/01/14  Signs/Symptoms  Weight changes, if any:yes,20 lbs past 2 months,   Respiratory complaints, if any: SOB,COPD,left lung pain, productive cough,white mucous,chronic CP  Hemoptysis, if WVP:XTGG blood but none in 3 days.  Pain issues, if any:mid sternum    SAFETY ISSUES:  Prior radiation?  NO  Pacemaker/ICD? NO  Is the patient on methotrexate? NO  Current Complaints / other details:Divorced (single),, 2 children, 1 son and 1 daughter  Father Liver Cancer, Mother heart disease, has started Hospice services with Dr. Rex Kras  Mediastinoscopy 8/12/123Dr.Edward,[place], Prostate surgery  11/25/10  Here today with sister for consultation.

## 2014-04-29 NOTE — Telephone Encounter (Signed)
lmomtcb x2 for amy-named VM

## 2014-04-30 ENCOUNTER — Telehealth: Payer: Self-pay | Admitting: *Deleted

## 2014-04-30 ENCOUNTER — Telehealth: Payer: Self-pay | Admitting: Emergency Medicine

## 2014-04-30 ENCOUNTER — Ambulatory Visit
Admission: RE | Admit: 2014-04-30 | Discharge: 2014-04-30 | Disposition: A | Discharge status: Home / Self Care | Payer: Medicare Other | Source: Ambulatory Visit | Attending: Radiation Oncology | Admitting: Radiation Oncology

## 2014-04-30 ENCOUNTER — Other Ambulatory Visit: Payer: Self-pay | Admitting: *Deleted

## 2014-04-30 ENCOUNTER — Encounter: Payer: Self-pay | Admitting: Radiation Oncology

## 2014-04-30 VITALS — BP 136/65 | HR 49 | Temp 97.6°F | Resp 20 | Wt 224.3 lb

## 2014-04-30 DIAGNOSIS — M542 Cervicalgia: Secondary | ICD-10-CM | POA: Diagnosis not present

## 2014-04-30 DIAGNOSIS — Z87891 Personal history of nicotine dependence: Secondary | ICD-10-CM | POA: Insufficient documentation

## 2014-04-30 DIAGNOSIS — J4489 Other specified chronic obstructive pulmonary disease: Secondary | ICD-10-CM | POA: Insufficient documentation

## 2014-04-30 DIAGNOSIS — C3432 Malignant neoplasm of lower lobe, left bronchus or lung: Secondary | ICD-10-CM

## 2014-04-30 DIAGNOSIS — I10 Essential (primary) hypertension: Secondary | ICD-10-CM | POA: Diagnosis not present

## 2014-04-30 DIAGNOSIS — Z510 Encounter for antineoplastic radiation therapy: Secondary | ICD-10-CM | POA: Diagnosis not present

## 2014-04-30 DIAGNOSIS — Z79899 Other long term (current) drug therapy: Secondary | ICD-10-CM | POA: Insufficient documentation

## 2014-04-30 DIAGNOSIS — C3410 Malignant neoplasm of upper lobe, unspecified bronchus or lung: Secondary | ICD-10-CM

## 2014-04-30 DIAGNOSIS — N400 Enlarged prostate without lower urinary tract symptoms: Secondary | ICD-10-CM | POA: Insufficient documentation

## 2014-04-30 DIAGNOSIS — C3430 Malignant neoplasm of lower lobe, unspecified bronchus or lung: Secondary | ICD-10-CM | POA: Diagnosis not present

## 2014-04-30 DIAGNOSIS — J449 Chronic obstructive pulmonary disease, unspecified: Secondary | ICD-10-CM | POA: Insufficient documentation

## 2014-04-30 DIAGNOSIS — Z791 Long term (current) use of non-steroidal anti-inflammatories (NSAID): Secondary | ICD-10-CM | POA: Insufficient documentation

## 2014-04-30 DIAGNOSIS — K219 Gastro-esophageal reflux disease without esophagitis: Secondary | ICD-10-CM | POA: Insufficient documentation

## 2014-04-30 HISTORY — DX: Pneumonia, unspecified organism: J18.9

## 2014-04-30 HISTORY — DX: Malignant neoplasm of unspecified part of unspecified bronchus or lung: C34.90

## 2014-04-30 NOTE — Telephone Encounter (Signed)
LMTCBx3 for Tyler [name]. [place] Bing, CMA

## 2014-04-30 NOTE — Progress Notes (Signed)
Please see the Nurse Progress Note in the MD Initial Consult Encounter for this patient. 

## 2014-04-30 NOTE — Telephone Encounter (Signed)
Dr Vista Mink wants a CT of the head for disease staging.  He cannot tolerate an MRI of the brain per Dr Vista Mink.  Benedetto Goad in medical mgmt states that CT of the head cannot be approved by insurance as long as there is an approved MRI of the brain ordered by Dr Lamonte Sakai.  Called Dr Agustina Caroli office and left a message with the pt care coordinator office requesting for them to withdrawal the MRI of the brain request from [facility].  SLJ

## 2014-05-01 ENCOUNTER — Encounter: Payer: Self-pay | Admitting: *Deleted

## 2014-05-01 ENCOUNTER — Ambulatory Visit (HOSPITAL_BASED_OUTPATIENT_CLINIC_OR_DEPARTMENT_OTHER): Payer: Medicare Other | Admitting: Internal Medicine

## 2014-05-01 ENCOUNTER — Encounter: Payer: Self-pay | Admitting: Internal Medicine

## 2014-05-01 ENCOUNTER — Telehealth: Payer: Self-pay | Admitting: *Deleted

## 2014-05-01 ENCOUNTER — Ambulatory Visit: Payer: Medicare Other | Attending: Internal Medicine | Admitting: Physical Therapy

## 2014-05-01 ENCOUNTER — Telehealth: Payer: Self-pay | Admitting: Internal Medicine

## 2014-05-01 ENCOUNTER — Other Ambulatory Visit (HOSPITAL_BASED_OUTPATIENT_CLINIC_OR_DEPARTMENT_OTHER): Payer: Medicare Other

## 2014-05-01 VITALS — BP 125/58 | HR 56 | Temp 97.9°F | Resp 18 | Ht 74.0 in | Wt 225.3 lb

## 2014-05-01 DIAGNOSIS — C3430 Malignant neoplasm of lower lobe, unspecified bronchus or lung: Secondary | ICD-10-CM

## 2014-05-01 DIAGNOSIS — Z9981 Dependence on supplemental oxygen: Secondary | ICD-10-CM | POA: Insufficient documentation

## 2014-05-01 DIAGNOSIS — IMO0001 Reserved for inherently not codable concepts without codable children: Secondary | ICD-10-CM | POA: Diagnosis not present

## 2014-05-01 DIAGNOSIS — R0602 Shortness of breath: Secondary | ICD-10-CM | POA: Insufficient documentation

## 2014-05-01 DIAGNOSIS — C3492 Malignant neoplasm of unspecified part of left bronchus or lung: Secondary | ICD-10-CM

## 2014-05-01 DIAGNOSIS — J4489 Other specified chronic obstructive pulmonary disease: Secondary | ICD-10-CM | POA: Insufficient documentation

## 2014-05-01 DIAGNOSIS — C3410 Malignant neoplasm of upper lobe, unspecified bronchus or lung: Secondary | ICD-10-CM | POA: Insufficient documentation

## 2014-05-01 DIAGNOSIS — J449 Chronic obstructive pulmonary disease, unspecified: Secondary | ICD-10-CM | POA: Diagnosis not present

## 2014-05-01 DIAGNOSIS — I10 Essential (primary) hypertension: Secondary | ICD-10-CM | POA: Insufficient documentation

## 2014-05-01 DIAGNOSIS — Z87891 Personal history of nicotine dependence: Secondary | ICD-10-CM | POA: Insufficient documentation

## 2014-05-01 LAB — COMPREHENSIVE METABOLIC PANEL (CC13)
ALBUMIN: 3.6 g/dL (ref 3.5–5.0)
ALT: 18 U/L (ref 0–55)
ANION GAP: 9 meq/L (ref 3–11)
AST: 19 U/L (ref 5–34)
Alkaline Phosphatase: 114 U/L (ref 40–150)
BUN: 23.4 mg/dL (ref 7.0–26.0)
CO2: 27 meq/L (ref 22–29)
Calcium: 9.3 mg/dL (ref 8.4–10.4)
Chloride: 103 mEq/L (ref 98–109)
Creatinine: 1.1 mg/dL (ref 0.7–1.3)
GLUCOSE: 158 mg/dL — AB (ref 70–140)
POTASSIUM: 5.1 meq/L (ref 3.5–5.1)
SODIUM: 139 meq/L (ref 136–145)
TOTAL PROTEIN: 7.2 g/dL (ref 6.4–8.3)
Total Bilirubin: 0.22 mg/dL (ref 0.20–1.20)

## 2014-05-01 LAB — CBC WITH DIFFERENTIAL/PLATELET
BASO%: 1 % (ref 0.0–2.0)
Basophils Absolute: 0.1 10*3/uL (ref 0.0–0.1)
EOS ABS: 0.4 10*3/uL (ref 0.0–0.5)
EOS%: 3.9 % (ref 0.0–7.0)
HCT: 37.2 % — ABNORMAL LOW (ref 38.4–49.9)
HGB: 12.3 g/dL — ABNORMAL LOW (ref 13.0–17.1)
LYMPH#: 2 10*3/uL (ref 0.9–3.3)
LYMPH%: 20.7 % (ref 14.0–49.0)
MCH: 29.6 pg (ref 27.2–33.4)
MCHC: 33.1 g/dL (ref 32.0–36.0)
MCV: 89.4 fL (ref 79.3–98.0)
MONO#: 0.5 10*3/uL (ref 0.1–0.9)
MONO%: 5.1 % (ref 0.0–14.0)
NEUT%: 69.3 % (ref 39.0–75.0)
NEUTROS ABS: 6.7 10*3/uL — AB (ref 1.5–6.5)
PLATELETS: 222 10*3/uL (ref 140–400)
RBC: 4.16 10*6/uL — AB (ref 4.20–5.82)
RDW: 14.9 % — AB (ref 11.0–14.6)
WBC: 9.6 10*3/uL (ref 4.0–10.3)

## 2014-05-01 NOTE — Progress Notes (Signed)
[place] Psychosocial Distress Screening Clinical Social Work  Clinical Social Work was referred by distress screening protocol.  The patient scored a 5 on the Psychosocial Distress Thermometer which indicates moderate distress. Clinical Social Worker phoned pt to assess for distress and other psychosocial needs. CSW left a vm as pt did not answer. CSW will attempt to see pt at future appointments and check in as needed.   ONCBCN DISTRESS SCREENING 04/30/2014  Screening Type Initial Screening  Elta Gu[place] the number that describes how much distress you have been experiencing in the past week 5  Emotional problem type Nervousness/Anxiety;Adjusting to illness;Isolation/feeling alone;Boredom  Physical Problem type Pain;Getting around;Breathing  Other patient doesn't want to see social worker today.     Clinical Social Worker follow up needed: No. CSW will attempt to see pt at future appointments and check in as needed.   Loren Racer, LCSW Clinical Social Worker [facility] for [place] Wednesday, Thursday and Friday Phone: [phone] Fax: [phone]

## 2014-05-01 NOTE — Progress Notes (Signed)
Called and spoke with [facility] pathology dept.  Tissue has been sent on 04/17/14 and results are not back yet.  Dr. Julien Nordmann aware.

## 2014-05-01 NOTE — Telephone Encounter (Signed)
Gave pt appt for lab,md chemo and chemop class for july and August

## 2014-05-01 NOTE — Telephone Encounter (Signed)
Called [facility] and cancelled this mri left message for stephanie it has been cancelled [employer]

## 2014-05-01 NOTE — Telephone Encounter (Signed)
Spoke with Amy w/[facility]  - States that the patient had bx on June 12th, 2015  Pathology does not meet specimen requirements.  Wants to know if there is another 'recent' alternative bx sample they can persue. Form being faxed to triage to review.  Aware that Dr Lamonte Sakai not back in office until 05/06/14-- requests this be addressed ASAP.  Letter is in your review folder. Please advise. Thanks!

## 2014-05-01 NOTE — Progress Notes (Signed)
Radiation Oncology         [phone]) [phone] ________________________________  Initial outpatient Consultation - Date: 04/30/2014   Name: Tyler [name] MRN: [id number]   DOB: 1941/06/22  REFERRING PHYSICIAN: Curt Bears, MD  STAGE: Lung cancer   Primary site: Lung   Staging method: AJCC 7th Edition   Clinical: Stage IIIA (T2b, N2, M0) signed by Curt Bears, MD on 04/23/2014  4:51 PM   Summary: Stage IIIA (T2b, N2, M0)  HISTORY OF PRESENT ILLNESS::Tyler [name] is a 73 y.o. male  Who was worked up [employer]  for a left upper lobe mass in 2013. This included a PET scan which showed malignant range bilateral hilar and mediastinal lymph nodes.  He underwent biopsy on 06/11/12 which showed no evidence of cancer. A repeat CT in June of this year showed growth of the mass in left lower lobe with narrowing of the left lower bronchus.  Pathology from an endobronchial biopsy on 04/11/14 showed adenocarcinoma. He was seen by Dr. Julien Nordmann who ordered a repeat PET scan and a brain MRI. The PET scan was performed on 6/25 and agin showed uptake in bilateral paratracheal nodes, the left lower lobe mass, a conglomeration of nodules, and another high right paratracheal node. No evidence of metastatic disease was noted. He was not able to tolerate the MRI due to claustrophobia. He is seeing Dr. Julien Nordmann tomorrow. He has scant hemoptysis and no weight loss. He has severe ervical spinal pain for which he is on Percocet. His breathing is worse this summer.   PREVIOUS RADIATION THERAPY: No  PAST MEDICAL HISTORY:  has a past medical history of Allergic rhinitis; BPH (benign prostatic hyperplasia); GERD (gastroesophageal reflux disease); COPD (chronic obstructive pulmonary disease); Shortness of breath; Arthritis; Enlarged prostate; Hypertension; Mental disorder; Constipation; Cancer (Lung); Lung cancer (04/11/14); Chronic chest pain; and Pneumonia.    PAST SURGICAL HISTORY: Past Surgical History  Procedure  Laterality Date  . Ankle surgery  2005  . Prostate surgery  11/25/2010  . Hernia repair    . Mediastinoscopy  06/11/2012    Procedure: MEDIASTINOSCOPY;  Surgeon: Grace Isaac, MD;  Location: [facility];  Service: Thoracic;  Laterality: N/A;  . Inguinal hernia repair      left  . Eye surgery      cataract right  . Video bronchoscopy with endobronchial ultrasound  10/08/2012    Procedure: VIDEO BRONCHOSCOPY WITH ENDOBRONCHIAL ULTRASOUND;  Surgeon: Collene Gobble, MD;  Location: [facility];  Service: Pulmonary;  Laterality: N/A;  . Video bronchoscopy Bilateral 04/11/2014    Procedure: VIDEO BRONCHOSCOPY WITHOUT FLUORO;  Surgeon: Collene Gobble, MD;  Location: [facility];  Service: Cardiopulmonary;  Laterality: Bilateral;    FAMILY HISTORY:  Family History  Problem Relation Age of Onset  . Heart disease Mother   . Arthritis Sister   . Arthritis Sister   . Liver cancer Father     SOCIAL HISTORY:  History  Substance Use Topics  . Smoking status: Former Smoker -- 2.00 packs/day for 50 years    Types: Cigarettes    Quit date: 12/26/2009  . Smokeless tobacco: Never Used  . Alcohol Use: 14.4 oz/week    24 Cans of beer per week     Comment: 4 beers every night    ALLERGIES: Ketorolac tromethamine; Codeine; Hydrocod polst-cpm polst er; Tramadol; and Tussionex pennkinetic er  MEDICATIONS:  Current Outpatient Prescriptions  Medication Sig Dispense Refill  . albuterol (PROVENTIL) (2.5 MG/3ML) 0.083% nebulizer solution Take 3 mLs (2.5 mg  total) by nebulization every 6 (six) hours as needed for wheezing.  75 mL  3  . flurazepam (DALMANE) 30 MG capsule Take 30 mg by mouth at bedtime as needed for sleep.      . naproxen (NAPROSYN) 500 MG tablet Take 500 mg by mouth 2 (two) times daily with a meal.      . nebivolol (BYSTOLIC) 10 MG tablet Take 10 mg by mouth 2 (two) times daily. 1 tab bid      . omeprazole (PRILOSEC) 20 MG capsule Take 20 mg by mouth 2 (two) times daily before a meal.       .  oxyCODONE-acetaminophen (PERCOCET/ROXICET) 5-325 MG per tablet Take 1 tablet by mouth every 4 (four) hours as needed for severe pain.      . Tamsulosin HCl (FLOMAX) 0.4 MG CAPS Take 0.4 mg by mouth every morning.        No current facility-administered medications for this encounter.    REVIEW OF SYSTEMS:  A 15 point review of systems is documented in the electronic medical record. This was obtained by the nursing staff. However, I reviewed this with the patient to discuss relevant findings and make appropriate changes.  Pertinent items are noted in HPI.  PHYSICAL EXAM:  Filed Vitals:   04/30/14 0843  BP: 136/65  Pulse: 49  Temp: 97.6 F (36.4 C)  Resp: 20  .224 lb 4.8 oz (101.742 kg). Pleasant male. Alert and oriented. No distress.   LABORATORY DATA:  Lab Results  Component Value Date   WBC 8.3 04/23/2014   HGB 11.7 Repeated and Verified* 04/23/2014   HCT 35.4* 04/23/2014   MCV 89.6 04/23/2014   PLT 189 04/23/2014   Lab Results  Component Value Date   NA 139 04/23/2014   K 4.1 04/23/2014   CL 101 11/08/2013   CO2 28 04/23/2014   Lab Results  Component Value Date   ALT 10 04/23/2014   AST 12 04/23/2014   ALKPHOS 118 04/23/2014   BILITOT 0.23 04/23/2014     RADIOGRAPHY: Nm Pet Image Restag (ps) Skull Base To Thigh  04/24/2014   CLINICAL DATA:  Subsequent treatment strategy for lung carcinoma. Concern for recurrence in the left lower lobe.  EXAM: NUCLEAR MEDICINE PET SKULL BASE TO THIGH  TECHNIQUE: 11.3 mCi F-18 FDG was injected intravenously. Full-ring PET imaging was performed from the skull base to thigh after the radiotracer. CT data was obtained and used for attenuation correction and anatomic localization.  FASTING BLOOD GLUCOSE:  Value: 105 mg/dl  COMPARISON:  PET-CT 04/04/2012, CT 03/25/2004  FINDINGS: NECK  Hypermetabolic lymph nodes in the neck.  CHEST  Within the superior segment of the left lower lobe there is a thick-walled cavitary lesion measuring 4.2 cm with intense  metabolic activity (SUV max = 11.9). This cavitary mass isa just inferior to the hilum and along the left lower lobe bronchus.  More peripherally and inferiorly in the left lower lobe there is a cluster of nodules measuring 4 cm in conglomerate also with metabolic activity (SUV max = 6.9).  There are hypermetabolic right and left lower lower paratracheal lymph nodes with SUV max equals 6.5. There is a hypermetabolic high right paratracheal lymph node (image 72 fused series)  ABDOMEN/PELVIS  No abnormal hypermetabolic activity within the liver, pancreas, adrenal glands, or spleen. No hypermetabolic lymph nodes in the abdomen or pelvis.  Large hiatal hernia the posterior to the right heart.  SKELETON  No focal hypermetabolic activity to suggest skeletal  metastasis.  IMPRESSION: 1. Hypermetabolic cavitary mass within the superior segment of left lower lobe is consistent lung cancer recurrence. 2. Hypermetabolic pulmonary nodules peripheral to this mass is concerning for lymphangitic spread of tumor. 3. Hypermetabolic contralateral and ipsilateral mediastinal nodal metastasis. 4. No evidence of more distant metastasis.   Electronically Signed   By: Suzy Bouchard M.D.   On: 04/24/2014 15:35      IMPRESSION: Stage III NSCLC of the left lower lobe  PLAN: We We discussed pre-operative chemotherapy radiation followed by surgical resection. He discussed the process of simulation the placement tattoos. We discussed 30 treatments as an outpatient T We discussed the process of simulation the placement tattoos. We discussed esophagitis and fatigue as well as skin darkness redness and irritation as the main side effects of treatment. Dr. Julien Nordmann would like to start his chemotherapy on July 13 and we will get simulation scheduled for next week in order to be ready. A CT of the brain will be ordered as well.   I spent 60 minutes  face to face with the patient and more than 50% of that time was spent in counseling and/or  coordination of care.   ------------------------------------------------  Thea Silversmith, MD

## 2014-05-01 NOTE — Telephone Encounter (Signed)
Per staff message and POF I have scheduled appts. Advised scheduler of appts. JMW  

## 2014-05-01 NOTE — Telephone Encounter (Signed)
Received call from [facility] at Dr Agustina Caroli office.  She withdrew the MRI of the brain, will forward message to Benedetto Goad at managed care.  SLJ

## 2014-05-01 NOTE — Progress Notes (Signed)
[facility] Clinical Social Work  Clinical Social Work met with patient/family and Futures trader at [facility] appointment to offer support and assess for psychosocial needs.  Medical oncologist reviewed patient's diagnosis and recommended treatment plan with patient/family.  Patient saw radiation oncologist yesterday and plans to start chemo and radiation soon.  Patient was accompanied by his daughter.  She had questions regarding patient's medical bills- CSW encouraged patient to contact financial advocates for assistance.  Clinical Social Work briefly discussed Clinical Social Work role and [employer] support programs/services.  Clinical Social Work encouraged patient to call with any additional questions or concerns.   Polo Riley, MSW, LCSW, OSW-C Clinical Social Worker [facility] [phone]

## 2014-05-01 NOTE — Progress Notes (Signed)
   Thoracic Treatment Summary Name:Tyler [name] Date:05/01/2014 DOB:[birth year] Your Medical Team Medical Oncologist:Dr. Julien Nordmann Radiation Oncologist:Dr. Pablo Ledger Pulmonologist:  Surgeon:Dr.Gerhardt Type and Stage of Lung Cancer Non-Small Cell Carcinoma: Adenocarcinoma   Clinical Stage:  Lung cancer   Primary site: Lung   Staging method: AJCC 7th Edition   Clinical: Stage IIIB (T2b, N3, M0) signed by Curt Bears, MD on 05/01/2014  1:50 PM   Summary: Stage IIIB (T2b, N3, M0)    Clinical stage is based on radiology exams.  Pathological stage will be determined after surgery.  Staging is based on the size of the tumor, involvement of lymph nodes or not, and whether or not the cancer center has spread. Recommendations Recommendations:  These recommendations are based on information available as of today's consult.  This is subject to change depending further testing or exams. Next Steps Next Step: Medical Oncology will set up follow up appointments Radiation Oncology will set up appointments Barriers to Care What do you perceive as a potential barrier that may prevent you from receiving your treatment plan? No barriers perceived at this time.    Resources Given: NCI Booklet on [place] [email or web address] [phone] Support/Resources Services at [facility]    Questions Norton Blizzard, RN BSN Thoracic Oncology Nurse Navigator at [place] is a nurse navigator that is available to assist you through your cancer journey.  She can answer your questions and/or provide resources regarding your treatment plan, emotional support, or financial concerns.

## 2014-05-01 NOTE — Telephone Encounter (Signed)
LMTCBx4 for AMy to see what test she is talking about. Milton Bing, CMA

## 2014-05-01 NOTE — Progress Notes (Signed)
[facility] [phone]   Fax:([phone]) [place], MD [address] [place] [address]  DIAGNOSIS: Stage IIIB (T2b, N3, M0) non-small cell lung cancer, adenocarcinoma, diagnosed in June of 2015 with large left lower lobe lung mass in addition to ipsilateral and contralateral mediastinal lymphadenopathy.  PRIOR THERAPY: None  CURRENT THERAPY: Course of concurrent chemoradiation with weekly carboplatin for AUC of 2 and paclitaxel 45 mg/M2. First dose expected 05/12/2014.  INTERVAL HISTORY: Tyler [name] 73 y.o. male returns to the clinic today for followup visit accompanied by his daughter. The patient is feeling fine today except for the baseline shortness of breath and he is currently on oxygen. He denied having any significant chest pain, cough or hemoptysis. He has no weight loss or night sweats. The patient denied having any significant nausea or vomiting, no fever or chills. He recently underwent several studies for staging of his disease including a PET scan but he could not tolerate MRI of the brain. He was also seen by Dr. Pablo Ledger for evaluation and consideration of concurrent radiotherapy. He is here today for evaluation and discussion of his systemic chemotherapy.  MEDICAL HISTORY: Past Medical History  Diagnosis Date  . Allergic rhinitis   . BPH (benign prostatic hyperplasia)   . GERD (gastroesophageal reflux disease)   . COPD (chronic obstructive pulmonary disease)   . Shortness of breath   . Arthritis   . Enlarged prostate   . Hypertension     dr Margreta Journey little in [place]   pcp      dr Stanford Breed  cardiac md  . Mental disorder   . Constipation   . Cancer Lung  . 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 Chronic chest pain   . Pneumonia     hx    ALLERGIES:  is allergic to ketorolac tromethamine; codeine; tramadol; and tussionex pennkinetic er.  MEDICATIONS:  Current Outpatient  Prescriptions  Medication Sig Dispense Refill  . albuterol (PROVENTIL) (2.5 MG/3ML) 0.083% nebulizer solution Take 3 mLs (2.5 mg total) by nebulization every 6 (six) hours as needed for wheezing.  75 mL  3  . flurazepam (DALMANE) 30 MG capsule Take 30 mg by mouth at bedtime as needed for sleep.      . naproxen (NAPROSYN) 500 MG tablet Take 500 mg by mouth 2 (two) times daily with a meal.      . nebivolol (BYSTOLIC) 10 MG tablet Take 10 mg by mouth 2 (two) times daily. 1 tab bid      . omeprazole (PRILOSEC) 20 MG capsule Take 20 mg by mouth 2 (two) times daily before a meal.       . oxyCODONE-acetaminophen (PERCOCET/ROXICET) 5-325 MG per tablet Take 1 tablet by mouth every 4 (four) hours as needed for severe pain.      . Tamsulosin HCl (FLOMAX) 0.4 MG CAPS Take 0.4 mg by mouth every morning.        No current facility-administered medications for this visit.    SURGICAL HISTORY:  Past Surgical History  Procedure Laterality Date  . Ankle surgery  2005  . Prostate surgery  11/25/2010  . Hernia repair    . Mediastinoscopy  06/11/2012    Procedure: MEDIASTINOSCOPY;  Surgeon: Grace Isaac, MD;  Location: [facility];  Service: Thoracic;  Laterality: N/A;  . Inguinal hernia repair      left  . Eye surgery  cataract right  . Video bronchoscopy with endobronchial ultrasound  10/08/2012    Procedure: VIDEO BRONCHOSCOPY WITH ENDOBRONCHIAL ULTRASOUND;  Surgeon: Collene Gobble, MD;  Location: [facility];  Service: Pulmonary;  Laterality: N/A;  . Video bronchoscopy Bilateral 04/11/2014    Procedure: VIDEO BRONCHOSCOPY WITHOUT FLUORO;  Surgeon: Collene Gobble, MD;  Location: [facility];  Service: Cardiopulmonary;  Laterality: Bilateral;    REVIEW OF SYSTEMS:  Constitutional: positive for fatigue Eyes: negative Ears, nose, mouth, throat, and face: negative Respiratory: positive for dyspnea on exertion Cardiovascular: negative Gastrointestinal: negative Genitourinary:negative Integument/breast:  negative Hematologic/lymphatic: negative Musculoskeletal:negative Neurological: negative Behavioral/Psych: negative Endocrine: negative Allergic/Immunologic: negative   PHYSICAL EXAMINATION: General appearance: alert, cooperative and no distress Head: Normocephalic, without obvious abnormality, atraumatic Neck: no adenopathy, no JVD, supple, symmetrical, trachea midline and thyroid not enlarged, symmetric, no tenderness/mass/nodules Lymph nodes: Cervical, supraclavicular, and axillary nodes normal. Resp: wheezes bilaterally Back: symmetric, no curvature. ROM normal. No CVA tenderness. Cardio: regular rate and rhythm, S1, S2 normal, no murmur, click, rub or gallop GI: soft, non-tender; bowel sounds normal; no masses,  no organomegaly Extremities: extremities normal, atraumatic, no cyanosis or edema Neurologic: Alert and oriented X 3, normal strength and tone. Normal symmetric reflexes. Normal coordination and gait  ECOG PERFORMANCE STATUS: 1 - Symptomatic but completely ambulatory  Blood pressure 125/58, pulse 56, temperature 97.9 F (36.6 C), temperature source Oral, resp. rate 18, height 6\' 2"  (1.88 m), weight 225 lb 4.8 oz (102.195 kg).  LABORATORY DATA: Lab Results  Component Value Date   WBC 9.6 05/01/2014   HGB 12.3* 05/01/2014   HCT 37.2* 05/01/2014   MCV 89.4 05/01/2014   PLT 222 05/01/2014      Chemistry      Component Value Date/Time   NA 139 05/01/2014 1235   NA 139 11/08/2013 1524   K 5.1 05/01/2014 1235   K 4.1 11/08/2013 1524   CL 101 11/08/2013 1524   CO2 27 05/01/2014 1235   CO2 30 11/08/2013 1524   BUN 23.4 05/01/2014 1235   BUN 13 11/08/2013 1524   CREATININE 1.1 05/01/2014 1235   CREATININE 0.8 11/08/2013 1524      Component Value Date/Time   CALCIUM 9.3 05/01/2014 1235   CALCIUM 9.5 11/08/2013 1524   ALKPHOS 114 05/01/2014 1235   ALKPHOS 118* 11/08/2013 1524   AST 19 05/01/2014 1235   AST 20 11/08/2013 1524   ALT 18 05/01/2014 1235   ALT 19 11/08/2013 1524   BILITOT 0.22 05/01/2014 1235    BILITOT 0.6 11/08/2013 1524       RADIOGRAPHIC STUDIES: Nm Pet Image Restag (ps) Skull Base To Thigh  04/24/2014   CLINICAL DATA:  Subsequent treatment strategy for lung carcinoma. Concern for recurrence in the left lower lobe.  EXAM: NUCLEAR MEDICINE PET SKULL BASE TO THIGH  TECHNIQUE: 11.3 mCi F-18 FDG was injected intravenously. Full-ring PET imaging was performed from the skull base to thigh after the radiotracer. CT data was obtained and used for attenuation correction and anatomic localization.  FASTING BLOOD GLUCOSE:  Value: 105 mg/dl  COMPARISON:  PET-CT 04/04/2012, CT 03/25/2004  FINDINGS: NECK  Hypermetabolic lymph nodes in the neck.  CHEST  Within the superior segment of the left lower lobe there is a thick-walled cavitary lesion measuring 4.2 cm with intense metabolic activity (SUV max = 11.9). This cavitary mass isa just inferior to the hilum and along the left lower lobe bronchus.  More peripherally and inferiorly in the left lower lobe there is  a cluster of nodules measuring 4 cm in conglomerate also with metabolic activity (SUV max = 6.9).  There are hypermetabolic right and left lower lower paratracheal lymph nodes with SUV max equals 6.5. There is a hypermetabolic high right paratracheal lymph node (image 72 fused series)  ABDOMEN/PELVIS  No abnormal hypermetabolic activity within the liver, pancreas, adrenal glands, or spleen. No hypermetabolic lymph nodes in the abdomen or pelvis.  Large hiatal hernia the posterior to the right heart.  SKELETON  No focal hypermetabolic activity to suggest skeletal metastasis.  IMPRESSION: 1. Hypermetabolic cavitary mass within the superior segment of left lower lobe is consistent lung cancer recurrence. 2. Hypermetabolic pulmonary nodules peripheral to this mass is concerning for lymphangitic spread of tumor. 3. Hypermetabolic contralateral and ipsilateral mediastinal nodal metastasis. 4. No evidence of more distant metastasis.   Electronically Signed   By:  Suzy Bouchard M.D.   On: 04/24/2014 15:35    ASSESSMENT AND PLAN: This is a very pleasant 73 years old white male recently diagnosed with a stage IIIB non-small cell lung cancer, adenocarcinoma with large left lower lobe lung mass in addition to ipsilateral and contralateral mediastinal lymphadenopathy. The patient could not tolerate MRI of the brain. I will arrange for him to have CT scan of the head if you next week. I discussed with the patient his treatment options. I recommended for him a course of concurrent chemoradiation with weekly carboplatin for AUC of 2 and paclitaxel 45 mg/M2 for 6-7 weeks. I discussed with the patient the adverse effect of the chemotherapy including but not limited to alopecia, myelosuppression, nausea and vomiting, peripheral neuropathy, liver or renal dysfunction. I also gave the patient handouts about his chemotherapy. He would like to proceed with treatment as planned and he gives a verbal consent. He is expected to start the first cycle of this treatment on 05/12/2014. I will arrange for the patient to have a chemotherapy education class before starting the first dose of his chemotherapy. The tissue block was sent for mutational studies but the results are still pending. I will call his pharmacy with prescription for Compazine 10 mg by mouth every 6 hours as needed for nausea. The patient would come back for followup visit in 3 weeks for evaluation and management any adverse effect of his treatment. He was advised to call immediately if he has any concerning symptoms in the interval.  The patient voices understanding of current disease status and treatment options and is in agreement with the current care plan.  All questions were answered. The patient knows to call the clinic with any problems, questions or concerns. We can certainly see the patient much sooner if necessary.  I spent 20 minutes counseling the patient face to face. The total time spent in the  appointment was 30 minutes.  Disclaimer: This note was dictated with voice recognition software. Similar sounding words can inadvertently be transcribed and may not be corrected upon review.

## 2014-05-05 ENCOUNTER — Encounter (HOSPITAL_COMMUNITY): Payer: Self-pay

## 2014-05-05 ENCOUNTER — Other Ambulatory Visit: Payer: Medicare Other

## 2014-05-05 ENCOUNTER — Telehealth: Payer: Self-pay | Admitting: Emergency Medicine

## 2014-05-05 ENCOUNTER — Other Ambulatory Visit: Payer: Self-pay | Admitting: Internal Medicine

## 2014-05-05 ENCOUNTER — Telehealth: Payer: Self-pay | Admitting: Medical Oncology

## 2014-05-05 ENCOUNTER — Ambulatory Visit (HOSPITAL_COMMUNITY)
Admission: RE | Admit: 2014-05-05 | Discharge: 2014-05-05 | Disposition: A | Discharge status: Home / Self Care | Payer: Medicare Other | Source: Ambulatory Visit | Attending: Internal Medicine | Admitting: Internal Medicine

## 2014-05-05 ENCOUNTER — Other Ambulatory Visit: Payer: Self-pay | Admitting: *Deleted

## 2014-05-05 DIAGNOSIS — C3432 Malignant neoplasm of lower lobe, left bronchus or lung: Secondary | ICD-10-CM

## 2014-05-05 DIAGNOSIS — G319 Degenerative disease of nervous system, unspecified: Secondary | ICD-10-CM | POA: Insufficient documentation

## 2014-05-05 DIAGNOSIS — Z8673 Personal history of transient ischemic attack (TIA), and cerebral infarction without residual deficits: Secondary | ICD-10-CM | POA: Insufficient documentation

## 2014-05-05 DIAGNOSIS — C3490 Malignant neoplasm of unspecified part of unspecified bronchus or lung: Secondary | ICD-10-CM | POA: Insufficient documentation

## 2014-05-05 MED ORDER — IOHEXOL 300 MG/ML  SOLN
100.0000 mL | Freq: Once | INTRAMUSCULAR | Status: AC | PRN
  Administered 2014-05-05: 100 mL via INTRAVENOUS

## 2014-05-05 NOTE — Telephone Encounter (Signed)
Per the 7.1.15 phone note: Jonelle Sports, RN at 05/05/2014 10:16 AM     lmomtcb for [place] with Dr. Worthy Flank office to advised MRI has been cancelled.  Will send msg to RB to advise on bx specimen - letter regarding this issue is in your look at.   LMOM TCB x1 for [place]

## 2014-05-05 NOTE — Telephone Encounter (Signed)
I returned nurses call from [place] and left message to return my call.

## 2014-05-05 NOTE — Telephone Encounter (Signed)
lmomtcb for [place] with Dr. Worthy Flank office to advised MRI has been cancelled.  Will send msg to RB to advise on bx specimen - letter regarding this issue is in your look at.

## 2014-05-05 NOTE — Telephone Encounter (Signed)
Per [place] RN - MRI cancelled and note sent to [facility] regardidng extra tissue for test . They will call back after [place] responds to note.

## 2014-05-05 NOTE — Telephone Encounter (Signed)
Spoke with Tyler [name] with Dr Arvilla Market office  Informed Tyler [name] that pt MRI has been cancelled.  Advised that Dr Lamonte Sakai not back in office until 05/06/14 Message has been sent to Dr Lamonte Sakai to advise on.  We will contact them back as soon as Dr Lamonte Sakai reviews the message.

## 2014-05-05 NOTE — Telephone Encounter (Signed)
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 There is no other existing cell block - if we are looking for more sample to send out then he would need another biopsy. Will need to discuss the risks / benefits of this with the patient and with Dr Julien Nordmann.  Will get this clarified on 7/7 when I am in the office

## 2014-05-05 NOTE — Telephone Encounter (Signed)
Per protocol I will sign off on message. All test that [facility] ordered are final in the system. [employer], CMA

## 2014-05-06 ENCOUNTER — Ambulatory Visit
Admission: RE | Admit: 2014-05-06 | Discharge: 2014-05-06 | Disposition: A | Discharge status: Home / Self Care | Payer: Medicare Other | Source: Ambulatory Visit | Attending: Radiation Oncology | Admitting: Radiation Oncology

## 2014-05-06 DIAGNOSIS — Z510 Encounter for antineoplastic radiation therapy: Secondary | ICD-10-CM | POA: Diagnosis not present

## 2014-05-06 DIAGNOSIS — C3432 Malignant neoplasm of lower lobe, left bronchus or lung: Secondary | ICD-10-CM

## 2014-05-06 NOTE — Progress Notes (Signed)
[facility] Simulation and Treatment Planning Note   Name: DEZMIN KITTELSON MRN: [id number]  Date: 05/06/2014  DOB: 1941-04-27  Status: outpatient    DIAGNOSIS: There were no encounter diagnoses.    SIDE: left   CONSENT VERIFIED: yes   SET UP AND IMMOBILIZATION: Patient is setup supine with arms in a wing board.   NARRATIVE: The patient was brought to the [facility].  Identity was confirmed.  All relevant records and images related to the planned course of therapy were reviewed.  Then, the patient was positioned in a stable reproducible clinical set-up for radiation therapy.  CT images were obtained.  Skin markings were placed.  The CT images were loaded into the planning software where the target and avoidance structures were contoured.  The radiation prescription was entered and confirmed.   TREATMENT PLANNING NOTE:  Treatment planning then occurred. I have requested 3D simulation with [facility] of the spinal cord, total lungs and gross tumor volume. I have also requested mlcs and an isodose plan.   Special treatment procedure will be performed as [name] will be receiving concurrent chemotherapy.   I have ordered a consult with the dietician for monitoring.  I will also be verifying that weekly lab values are appropriate.

## 2014-05-06 NOTE — Telephone Encounter (Signed)
reviewd case with Dr Julien Nordmann, plan in place to check serum genetic screening

## 2014-05-07 ENCOUNTER — Telehealth: Payer: Self-pay | Admitting: Internal Medicine

## 2014-05-07 ENCOUNTER — Telehealth: Payer: Self-pay | Admitting: Emergency Medicine

## 2014-05-07 NOTE — Telephone Encounter (Signed)
Called spoke with pt. Confirmed with him he told [facility] to forget about them coming out to eval him for portable O2. He reports when he is ready he will let us know. Nothing further needed

## 2014-05-07 NOTE — Telephone Encounter (Signed)
, °

## 2014-05-08 ENCOUNTER — Ambulatory Visit: Payer: Medicare Other | Admitting: Emergency Medicine

## 2014-05-09 ENCOUNTER — Encounter: Payer: Self-pay | Admitting: Radiation Oncology

## 2014-05-09 NOTE — Progress Notes (Signed)
We attempted 3D planning with Tyler [name] with open fields and dynamic conformal arcs. Due to the size of his planning tumor volume and the invovlement of hilar as well as high right paratracheal nodes, we were able to obtain coverage of the PTV at 95% but not able to meet safe lung constraints (V20 was 40%). 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 For this reason, I have requested IMRT planning.

## 2014-05-12 ENCOUNTER — Other Ambulatory Visit (HOSPITAL_BASED_OUTPATIENT_CLINIC_OR_DEPARTMENT_OTHER): Payer: Medicare Other

## 2014-05-12 ENCOUNTER — Encounter: Payer: Self-pay | Admitting: Internal Medicine

## 2014-05-12 ENCOUNTER — Ambulatory Visit: Payer: Medicare Other

## 2014-05-12 ENCOUNTER — Ambulatory Visit (HOSPITAL_BASED_OUTPATIENT_CLINIC_OR_DEPARTMENT_OTHER): Payer: Medicare Other

## 2014-05-12 ENCOUNTER — Other Ambulatory Visit: Payer: Self-pay | Admitting: *Deleted

## 2014-05-12 ENCOUNTER — Encounter (HOSPITAL_COMMUNITY): Payer: Self-pay

## 2014-05-12 VITALS — BP 166/80 | HR 56 | Temp 97.7°F | Resp 18

## 2014-05-12 DIAGNOSIS — I10 Essential (primary) hypertension: Secondary | ICD-10-CM

## 2014-05-12 DIAGNOSIS — Z5111 Encounter for antineoplastic chemotherapy: Secondary | ICD-10-CM

## 2014-05-12 DIAGNOSIS — C3430 Malignant neoplasm of lower lobe, unspecified bronchus or lung: Secondary | ICD-10-CM

## 2014-05-12 DIAGNOSIS — C3492 Malignant neoplasm of unspecified part of left bronchus or lung: Secondary | ICD-10-CM

## 2014-05-12 DIAGNOSIS — C3432 Malignant neoplasm of lower lobe, left bronchus or lung: Secondary | ICD-10-CM

## 2014-05-12 LAB — CBC WITH DIFFERENTIAL/PLATELET
BASO%: 0.4 % (ref 0.0–2.0)
Basophils Absolute: 0 10*3/uL (ref 0.0–0.1)
EOS%: 5.3 % (ref 0.0–7.0)
Eosinophils Absolute: 0.4 10*3/uL (ref 0.0–0.5)
HEMATOCRIT: 38.7 % (ref 38.4–49.9)
HGB: 12.7 g/dL — ABNORMAL LOW (ref 13.0–17.1)
LYMPH#: 1.9 10*3/uL (ref 0.9–3.3)
LYMPH%: 24.1 % (ref 14.0–49.0)
MCH: 29.8 pg (ref 27.2–33.4)
MCHC: 32.9 g/dL (ref 32.0–36.0)
MCV: 90.6 fL (ref 79.3–98.0)
MONO#: 0.4 10*3/uL (ref 0.1–0.9)
MONO%: 5.4 % (ref 0.0–14.0)
NEUT#: 5.1 10*3/uL (ref 1.5–6.5)
NEUT%: 64.8 % (ref 39.0–75.0)
PLATELETS: 182 10*3/uL (ref 140–400)
RBC: 4.27 10*6/uL (ref 4.20–5.82)
RDW: 14.6 % (ref 11.0–14.6)
WBC: 7.8 10*3/uL (ref 4.0–10.3)

## 2014-05-12 LAB — COMPREHENSIVE METABOLIC PANEL (CC13)
ALT: 16 U/L (ref 0–55)
AST: 15 U/L (ref 5–34)
Albumin: 3.7 g/dL (ref 3.5–5.0)
Alkaline Phosphatase: 122 U/L (ref 40–150)
Anion Gap: 10 mEq/L (ref 3–11)
BILIRUBIN TOTAL: 0.22 mg/dL (ref 0.20–1.20)
BUN: 23.4 mg/dL (ref 7.0–26.0)
CO2: 24 mEq/L (ref 22–29)
CREATININE: 1.1 mg/dL (ref 0.7–1.3)
Calcium: 9.3 mg/dL (ref 8.4–10.4)
Chloride: 102 mEq/L (ref 98–109)
Glucose: 140 mg/dl (ref 70–140)
Potassium: 4.5 mEq/L (ref 3.5–5.1)
Sodium: 136 mEq/L (ref 136–145)
Total Protein: 7.2 g/dL (ref 6.4–8.3)

## 2014-05-12 MED ORDER — CLONIDINE HCL 0.1 MG PO TABS
0.2000 mg | ORAL_TABLET | ORAL | Status: AC
  Administered 2014-05-12: 0.2 mg via ORAL

## 2014-05-12 MED ORDER — PROCHLORPERAZINE MALEATE 10 MG PO TABS: 10.0000 mg | ORAL_TABLET | Freq: Four times a day (QID) | ORAL | Status: DC | PRN

## 2014-05-12 MED ORDER — ONDANSETRON 16 MG/50ML IVPB (CHCC)
INTRAVENOUS | Status: AC
  Filled 2014-05-12: qty 16

## 2014-05-12 MED ORDER — FAMOTIDINE IN NACL 20-0.9 MG/50ML-% IV SOLN
INTRAVENOUS | Status: AC
  Filled 2014-05-12: qty 50

## 2014-05-12 MED ORDER — DIPHENHYDRAMINE HCL 50 MG/ML IJ SOLN
INTRAMUSCULAR | Status: AC
  Filled 2014-05-12: qty 1

## 2014-05-12 MED ORDER — SODIUM CHLORIDE 0.9 % IV SOLN
225.4000 mg | Freq: Once | INTRAVENOUS | Status: AC
  Administered 2014-05-12: 230 mg via INTRAVENOUS
  Filled 2014-05-12: qty 23

## 2014-05-12 MED ORDER — ONDANSETRON 16 MG/50ML IVPB (CHCC)
16.0000 mg | Freq: Once | INTRAVENOUS | Status: AC
  Administered 2014-05-12: 16 mg via INTRAVENOUS

## 2014-05-12 MED ORDER — CLONIDINE HCL 0.1 MG PO TABS
ORAL_TABLET | ORAL | Status: AC
  Filled 2014-05-12: qty 2

## 2014-05-12 MED ORDER — FAMOTIDINE IN NACL 20-0.9 MG/50ML-% IV SOLN
20.0000 mg | Freq: Once | INTRAVENOUS | Status: AC
  Administered 2014-05-12: 20 mg via INTRAVENOUS

## 2014-05-12 MED ORDER — DEXAMETHASONE SODIUM PHOSPHATE 20 MG/5ML IJ SOLN
INTRAMUSCULAR | Status: AC
  Filled 2014-05-12: qty 5

## 2014-05-12 MED ORDER — DEXAMETHASONE SODIUM PHOSPHATE 20 MG/5ML IJ SOLN
20.0000 mg | Freq: Once | INTRAMUSCULAR | Status: AC
  Administered 2014-05-12: 20 mg via INTRAVENOUS

## 2014-05-12 MED ORDER — DIPHENHYDRAMINE HCL 50 MG/ML IJ SOLN
50.0000 mg | Freq: Once | INTRAMUSCULAR | Status: AC
  Administered 2014-05-12: 50 mg via INTRAVENOUS

## 2014-05-12 MED ORDER — PACLITAXEL CHEMO INJECTION 300 MG/50ML
45.0000 mg/m2 | Freq: Once | INTRAVENOUS | Status: AC
  Administered 2014-05-12: 102 mg via INTRAVENOUS
  Filled 2014-05-12: qty 17

## 2014-05-12 MED ORDER — SODIUM CHLORIDE 0.9 % IV SOLN
Freq: Once | INTRAVENOUS | Status: AC
  Administered 2014-05-12: 14:00:00 via INTRAVENOUS

## 2014-05-12 NOTE — Patient Instructions (Signed)
[facility] Discharge Instructions for Patients Receiving Chemotherapy  Today you received the following chemotherapy agents TAXOL,CARBOPLATIN To help prevent nausea and vomiting after your treatment, we encourage you to take your nausea medication as prescribed. If you develop nausea and vomiting that is not controlled by your nausea medication, call the clinic.   BELOW ARE SYMPTOMS THAT SHOULD BE REPORTED IMMEDIATELY:  *FEVER GREATER THAN 100.5 F  *CHILLS WITH OR WITHOUT FEVER  NAUSEA AND VOMITING THAT IS NOT CONTROLLED WITH YOUR NAUSEA MEDICATION  *UNUSUAL SHORTNESS OF BREATH  *UNUSUAL BRUISING OR BLEEDING  TENDERNESS IN MOUTH AND THROAT WITH OR WITHOUT PRESENCE OF ULCERS  *URINARY PROBLEMS  *BOWEL PROBLEMS  UNUSUAL RASH Items with * indicate a potential emergency and should be followed up as soon as possible.  Feel free to call the clinic you have any questions or concerns. The clinic phone number is [phone].

## 2014-05-12 NOTE — Progress Notes (Signed)
Checked in new pt with no financial concerns. °

## 2014-05-13 ENCOUNTER — Ambulatory Visit: Payer: Medicare Other | Admitting: Radiation Oncology

## 2014-05-13 ENCOUNTER — Ambulatory Visit: Payer: Medicare Other

## 2014-05-14 ENCOUNTER — Ambulatory Visit: Payer: Medicare Other

## 2014-05-14 ENCOUNTER — Encounter: Payer: Self-pay | Admitting: Radiation Oncology

## 2014-05-14 ENCOUNTER — Ambulatory Visit: Payer: Medicare Other | Admitting: Radiation Oncology

## 2014-05-14 ENCOUNTER — Telehealth: Payer: Self-pay | Admitting: *Deleted

## 2014-05-14 NOTE — Telephone Encounter (Signed)
Message copied by Cherylynn Ridges on Wed May 14, 2014  3:31 PM ------      Message from: Ardeen Garland      Created: Tue May 13, 2014  1:27 PM       F/u first taxol,carbo  ------

## 2014-05-14 NOTE — Progress Notes (Signed)
I spoke with Tyler [name] today. We are unable to produce a plan at this point which treats his disease and spares an adequate portion of his normal lung. 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  The superior to inferior field size is prohibitive.  We will continue to work on a plan but I believe he should proceed forward with systemic chemotherapy. I do not believe that chemotherapy will improve our fields as it will not shrink the field size in the sup/inf direction.  I will continue to work on a plan next week for him and promised to let him know by the following week if we could proceed with treatment at some point. He is going to follow up with his PCP regarding his pain medications and refills on his other medications. I asked to make an appt this week but he would like to delay until Tuesday.

## 2014-05-14 NOTE — Telephone Encounter (Signed)
[facility] for chemotherapy F/U.  Patient is doing well.  Denies n/v.  Denies any new side effects or symptoms.  Bowel and bladder is functioning well.  Eating and drinking well and I instructed to drink 64 oz minimum daily or at least the day before, of and after treatment.  Asked when he is to return.  05-19-2014 beginning at 11:15 am is next scheduled f/u at [facility].  Denies further questions at this time and encouraged to call if needed.  Reviewed how to call after hours in the case of an emergency.

## 2014-05-15 ENCOUNTER — Ambulatory Visit: Payer: Medicare Other

## 2014-05-15 ENCOUNTER — Ambulatory Visit: Payer: Medicare Other | Admitting: Radiation Oncology

## 2014-05-16 ENCOUNTER — Ambulatory Visit: Payer: Medicare Other

## 2014-05-16 ENCOUNTER — Telehealth: Payer: Self-pay | Admitting: Emergency Medicine

## 2014-05-16 NOTE — Telephone Encounter (Signed)
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

## 2014-05-19 ENCOUNTER — Other Ambulatory Visit (HOSPITAL_BASED_OUTPATIENT_CLINIC_OR_DEPARTMENT_OTHER): Payer: Medicare Other

## 2014-05-19 ENCOUNTER — Ambulatory Visit: Payer: Medicare Other

## 2014-05-19 ENCOUNTER — Ambulatory Visit (HOSPITAL_BASED_OUTPATIENT_CLINIC_OR_DEPARTMENT_OTHER): Payer: Medicare Other | Admitting: Physician Assistant

## 2014-05-19 ENCOUNTER — Encounter: Payer: Self-pay | Admitting: Physician Assistant

## 2014-05-19 ENCOUNTER — Telehealth: Payer: Self-pay | Admitting: Internal Medicine

## 2014-05-19 VITALS — BP 142/58 | HR 56 | Temp 97.8°F | Resp 18 | Ht 74.0 in | Wt 225.9 lb

## 2014-05-19 DIAGNOSIS — C3430 Malignant neoplasm of lower lobe, unspecified bronchus or lung: Secondary | ICD-10-CM

## 2014-05-19 DIAGNOSIS — C3410 Malignant neoplasm of upper lobe, unspecified bronchus or lung: Secondary | ICD-10-CM

## 2014-05-19 DIAGNOSIS — C3492 Malignant neoplasm of unspecified part of left bronchus or lung: Secondary | ICD-10-CM

## 2014-05-19 LAB — CBC WITH DIFFERENTIAL/PLATELET
BASO%: 0.7 % (ref 0.0–2.0)
Basophils Absolute: 0.1 10*3/uL (ref 0.0–0.1)
EOS%: 5.4 % (ref 0.0–7.0)
Eosinophils Absolute: 0.4 10*3/uL (ref 0.0–0.5)
HEMATOCRIT: 38 % — AB (ref 38.4–49.9)
HEMOGLOBIN: 12 g/dL — AB (ref 13.0–17.1)
LYMPH%: 30.8 % (ref 14.0–49.0)
MCH: 30.4 pg (ref 27.2–33.4)
MCHC: 31.6 g/dL — ABNORMAL LOW (ref 32.0–36.0)
MCV: 96.2 fL (ref 79.3–98.0)
MONO#: 0.4 10*3/uL (ref 0.1–0.9)
MONO%: 5.8 % (ref 0.0–14.0)
NEUT#: 3.9 10*3/uL (ref 1.5–6.5)
NEUT%: 57.3 % (ref 39.0–75.0)
PLATELETS: 128 10*3/uL — AB (ref 140–400)
RBC: 3.95 10*6/uL — ABNORMAL LOW (ref 4.20–5.82)
RDW: 15.3 % — ABNORMAL HIGH (ref 11.0–14.6)
WBC: 6.8 10*3/uL (ref 4.0–10.3)
lymph#: 2.1 10*3/uL (ref 0.9–3.3)
nRBC: 0 % (ref 0–0)

## 2014-05-19 LAB — COMPREHENSIVE METABOLIC PANEL (CC13)
ALT: 23 U/L (ref 0–55)
ANION GAP: 8 meq/L (ref 3–11)
AST: 20 U/L (ref 5–34)
Albumin: 3.6 g/dL (ref 3.5–5.0)
Alkaline Phosphatase: 116 U/L (ref 40–150)
BUN: 23.3 mg/dL (ref 7.0–26.0)
CO2: 26 mEq/L (ref 22–29)
CREATININE: 1 mg/dL (ref 0.7–1.3)
Calcium: 9.2 mg/dL (ref 8.4–10.4)
Chloride: 103 mEq/L (ref 98–109)
GLUCOSE: 110 mg/dL (ref 70–140)
Potassium: 4.5 mEq/L (ref 3.5–5.1)
Sodium: 137 mEq/L (ref 136–145)
TOTAL PROTEIN: 7.2 g/dL (ref 6.4–8.3)
Total Bilirubin: 0.26 mg/dL (ref 0.20–1.20)

## 2014-05-19 MED ORDER — FOLIC ACID 1 MG PO TABS: 1.0000 mg | ORAL_TABLET | Freq: Every day | ORAL | Status: DC

## 2014-05-19 MED ORDER — CYANOCOBALAMIN 1000 MCG/ML IJ SOLN
1000.0000 ug | Freq: Once | INTRAMUSCULAR | Status: AC
  Administered 2014-05-19: 1000 ug via INTRAMUSCULAR

## 2014-05-19 MED ORDER — DEXAMETHASONE 4 MG PO TABS: ORAL_TABLET | ORAL | Status: DC

## 2014-05-19 MED ORDER — CYANOCOBALAMIN 1000 MCG/ML IJ SOLN
INTRAMUSCULAR | Status: AC
  Filled 2014-05-19: qty 1

## 2014-05-19 NOTE — Patient Instructions (Addendum)
Vitamin B12 Injections Every person needs vitamin B12. A deficiency develops when the body does not get enough of it. One way to overcome this is by getting B12 shots (injections). A B12 shot puts the vitamin directly into muscle tissue. This avoids any problems your body might have in absorbing it from food or a pill. In some people, the body has trouble using the vitamin correctly. This can cause a B12 deficiency. Not consuming enough of the vitamin can also cause a deficiency. Getting enough vitamin B12 can be hard for elderly people. Sometimes, they do not eat a well-balanced diet. The elderly are also more likely than younger people to have medical conditions or take medications that can lead to a deficiency. WHAT DOES VITAMIN B12 DO? Vitamin B12 does many things to help the body work right:  It helps the body make healthy red blood cells.  It helps maintain nerve cells.  It is involved in the body's process of converting food into energy (metabolism).  It is needed to make the genetic material in all cells (DNA). VITAMIN B12 FOOD SOURCES Most people get plenty of vitamin B12 through the foods they eat. It is present in:  Meat, fish, poultry, and eggs.  Milk and milk products.  It also is added when certain foods are made, including some breads, cereals and yogurts. The food is then called "fortified". CAUSES The most common causes of vitamin B12 deficiency are:  Pernicious anemia. The condition develops when the body cannot make enough healthy red blood cells. This stems from a lack of a protein made in the stomach (intrinsic factor). People without this protein cannot absorb enough vitamin B12 from food.  Malabsorption. This is when the body cannot absorb the vitamin. It can be caused by:  Pernicious anemia.  Surgery to remove part or all of the stomach can lead to malabsorption. Removal of part or all of the small intestine can also cause malabsorption.  Vegetarian diet.  People who are strict about not eating foods from animals could have trouble taking in enough vitamin B12 from diet alone.  Medications. Some medicines have been linked to B12 deficiency, such as Metformin (a drug prescribed for type 2 diabetes). Long-term use of stomach acid suppressants also can keep the vitamin from being absorbed.  Intestinal problems such as inflammatory bowel disease. If there are problems in the digestive tract, vitamin B12 may not be absorbed in good enough amounts. SYMPTOMS People who do not get enough B12 can develop problems. These can include:  Anemia. This is when the body has too few red blood cells. Red blood cells carry oxygen to the rest of the body. Without a healthy supply of red blood cells, people can feel:  Tired (fatigued).  Weak.  Severe anemia can cause:  Shortness of breath.  Dizziness.  Rapid heart rate.  Paleness.  Other Vitamin B12 deficiency symptoms include:  Diarrhea.  Numbness or tingling in the hands or feet.  Loss of appetite.  Confusion.  Sores on the tongue or in the mouth. LET YOUR CAREGIVER KNOW ABOUT:  Any allergies. It is very important to know if you are allergic or sensitive to cobalt. Vitamin B12 contains cobalt.  Any history of kidney disease.  All medications you are taking. Include prescription and over-the-counter medicines, herbs and creams.  Whether you are pregnant or breast-feeding.  If you have Leber's disease, a hereditary eye condition, vitamin B12 could make it worse. RISKS AND COMPLICATIONS Reactions to an injection are   usually temporary. They might include:  Pain at the injection site.  Redness, swelling or tenderness at the site.  Headache, dizziness or weakness.  Nausea, upset stomach or diarrhea.  Numbness or tingling.  Fever.  Joint pain.  Itching or rash. If a reaction does not go away in a short while, talk with your healthcare provider. A change in the way the shots are  given, or where they are given, might need to be made. BEFORE AN INJECTION To decide whether B12 injections are right for you, your healthcare provider will probably:  Ask about your medical history.  Ask questions about your diet.  Ask about symptoms such as:  Have you felt weak?  Do you feel unusually tired?  Do you get dizzy?  Order blood tests. These may include a test to:  Check the level of red cells in your blood.  Measure B12 levels.  Check for the presence of intrinsic factor. VITAMIN B12 INJECTIONS How often you will need a vitamin B12 injection will depend on how severe your deficiency is. This also will affect how long you will need to get them. People with pernicious anemia usually get injections for their entire life. Others might get them for a shorter period. For many people, injections are given daily or weekly for several weeks. Then, once B12 levels are normal, injections are given just once a month. If the cause of the deficiency can be fixed, the injections can be stopped. Talk with your healthcare provider about what you should expect. For an injection:  The injection site will be cleaned with an alcohol swab.  Your healthcare provider will insert a needle directly into a muscle. Most any muscle can be used. Most often, an arm muscle is used. A buttocks muscle can also be used. Many people say shots in that area are less painful.  A small adhesive bandage may be put over the injection site. It usually can be taken off in an hour or less. Injections can be given by your healthcare provider. In some cases, family members give them. Sometimes, people give them to themselves. Talk with your healthcare provider about what would be best for you. If someone other than your healthcare provider will be giving the shots, the person will need to be trained to give them correctly. HOME CARE INSTRUCTIONS   You can remove the adhesive bandage within an hour of getting a  shot.  You should be able to go about your normal activities right away.  Avoid drinking large amounts of alcohol while taking vitamin B12 shots. Alcohol can interfere with the body's use of the vitamin. SEEK MEDICAL CARE IF:   Pain, redness, swelling or tenderness at the injection site does not get better or gets worse.  Headache, dizziness or weakness does not go away.  You develop a fever of more than 100.5 F (38.1 C). SEEK IMMEDIATE MEDICAL CARE IF:   You have chest pain.  You develop shortness of breath.  You have muscle weakness that gets worse.  You develop numbness, weakness or tingling on one side or one area of the body.  You have symptoms of an allergic reaction, such as:  Hives.  Difficulty breathing.  Swelling of the lips, face, tongue or throat.  You develop a fever of more than 102.0 F (38.9 C). MAKE SURE YOU:   Understand these instructions.  Will watch your condition.  Will get help right away if you are not doing well or get worse. Document   Released: 01/13/2009 Document Revised: 01/09/2012 Document Reviewed: 01/13/2009 [facility] Patient Information 2015 [place], [place]. This information is not intended to replace advice given to you by your health care provider. Make sure you discuss any questions you have with your health care provider.  Due to the location of your tumor, radiation therapy is not an option at this time. Your being changed to systemic chemotherapy given every 3 weeks, beginning in one week. Continue weekly labs as scheduled Followup in 2 weeks for symptom management visit

## 2014-05-19 NOTE — Progress Notes (Addendum)
[place] [phone]   Fax:(336) [place], [place] [address] [name] [id number]  DIAGNOSIS: Stage IIIB (T2b, N3, M0) non-small cell lung cancer, adenocarcinoma, diagnosed in June of 2015 with large left lower lobe lung mass in addition to ipsilateral and contralateral mediastinal lymphadenopathy.  PRIOR THERAPY: Status post 1 week of weekly chemotherapy with carboplatin for an AUC of 2 and paclitaxel 45 mg per meter squared given on 05/12/2014  CURRENT THERAPY: Systemic chemotherapy with carboplatin for an AUC of 5 and Alimta 500 mg per meter squared given every 3 weeks First dose expected 05/26/2014.  INTERVAL HISTORY: Tyler [name] 73 y.o. male returns to the clinic today for followup visit accompanied by his daughter. The patient is feeling fine today except for the baseline shortness of breath and he is currently on oxygen, 2 liters via nasal cannula.He tolerated the first cycle of weekly carboplatin and paclitaxel without difficulty. He was to have begun a course of concurrent chemoradiation however because of the location of his tumor, radiation therapy is not an option at this time. He denied having any significant chest pain, cough or hemoptysis. He has no weight loss or night sweats. The patient denied having any significant nausea or vomiting, no fever or chills.  He is here today for evaluation and discussion of his systemic chemotherapy.  MEDICAL HISTORY: Past Medical History  Diagnosis Date  . Allergic rhinitis   . BPH (benign prostatic hyperplasia)   . GERD (gastroesophageal reflux disease)   . COPD (chronic obstructive pulmonary disease)   . Shortness of breath   . Arthritis   . Enlarged prostate   . Hypertension     dr Margreta Journey little in [place]   pcp      dr Stanford Breed  cardiac md  . Mental disorder   . Constipation   . Cancer Lung  . Lung cancer 04/11/14    lul carina lesion=Adenocarcinoma    . Chronic chest pain   . Pneumonia     hx    ALLERGIES:  is allergic to ketorolac tromethamine; codeine; tramadol; and tussionex pennkinetic er.  MEDICATIONS:  Current Outpatient Prescriptions  Medication Sig Dispense Refill  . albuterol (PROVENTIL) (2.5 MG/3ML) 0.083% nebulizer solution Take 3 mLs (2.5 mg total) by nebulization every 6 (six) hours as needed for wheezing.  75 mL  3  . flurazepam (DALMANE) 30 MG capsule Take 30 mg by mouth at bedtime as needed for sleep.      . naproxen (NAPROSYN) 500 MG tablet Take 500 mg by mouth 2 (two) times daily with a meal.      . nebivolol (BYSTOLIC) 10 MG tablet Take 10 mg by mouth 2 (two) times daily. 1 tab bid      . omeprazole (PRILOSEC) 20 MG capsule Take 20 mg by mouth 2 (two) times daily before a meal.       . prochlorperazine (COMPAZINE) 10 MG tablet Take 1 tablet (10 mg total) by mouth every 6 (six) hours as needed for nausea or vomiting.  30 tablet  1  . Tamsulosin HCl (FLOMAX) 0.4 MG CAPS Take 0.4 mg by mouth every morning.       Marland Kitchen dexamethasone (DECADRON) 4 MG tablet Take 1 tablet by mouth twice a day, the day before, the day of and the day after chemotherapy. Take with food  30 tablet  1  . folic acid (FOLVITE) 1 MG tablet  Take 1 tablet (1 mg total) by mouth daily.  30 tablet  3  . oxyCODONE-acetaminophen (PERCOCET/ROXICET) 5-325 MG per tablet Take 1 tablet by mouth every 4 (four) hours as needed for severe pain.       No current facility-administered medications for this visit.    SURGICAL HISTORY:  Past Surgical History  Procedure Laterality Date  . Ankle surgery  2005  . Prostate surgery  11/25/2010  . Hernia repair    . Mediastinoscopy  06/11/2012    Procedure: MEDIASTINOSCOPY;  Surgeon: Grace Isaac, MD;  Location: [facility];  Service: Thoracic;  Laterality: N/A;  . Inguinal hernia repair      left  . Eye surgery      cataract right  . Video bronchoscopy with endobronchial ultrasound  10/08/2012    Procedure: VIDEO  BRONCHOSCOPY WITH ENDOBRONCHIAL ULTRASOUND;  Surgeon: Collene Gobble, MD;  Location: [facility];  Service: Pulmonary;  Laterality: N/A;  . Video bronchoscopy Bilateral 04/11/2014    Procedure: VIDEO BRONCHOSCOPY WITHOUT FLUORO;  Surgeon: Collene Gobble, MD;  Location: [facility];  Service: Cardiopulmonary;  Laterality: Bilateral;    REVIEW OF SYSTEMS:  Constitutional: positive for fatigue Eyes: negative Ears, nose, mouth, throat, and face: negative Respiratory: positive for dyspnea on exertion Cardiovascular: negative Gastrointestinal: negative Genitourinary:negative Integument/breast: negative Hematologic/lymphatic: negative Musculoskeletal:negative Neurological: negative Behavioral/Psych: negative Endocrine: negative Allergic/Immunologic: negative   PHYSICAL EXAMINATION: General appearance: alert, cooperative and no distress Head: Normocephalic, without obvious abnormality, atraumatic Neck: no adenopathy, no JVD, supple, symmetrical, trachea midline and thyroid not enlarged, symmetric, no tenderness/mass/nodules Lymph nodes: Cervical, supraclavicular, and axillary nodes normal. Resp: wheezes bilaterally Back: symmetric, no curvature. ROM normal. No CVA tenderness. Cardio: regular rate and rhythm, S1, S2 normal, no murmur, click, rub or gallop GI: soft, non-tender; bowel sounds normal; no masses,  no organomegaly Extremities: extremities normal, atraumatic, no cyanosis or edema Neurologic: Alert and oriented X 3, normal strength and tone. Normal symmetric reflexes. Normal coordination and gait  ECOG PERFORMANCE STATUS: 1 - Symptomatic but completely ambulatory  Blood pressure 142/58, pulse 56, temperature 97.8 F (36.6 C), temperature source Oral, resp. rate 18, height _0  (1.88 m), weight 225 lb 14.4 oz (102.468 kg), SpO2 92.00%.  LABORATORY DATA: Lab Results  Component Value Date   WBC 6.8 05/19/2014   HGB 12.0* 05/19/2014   HCT 38.0* 05/19/2014   MCV 96.2 05/19/2014   PLT  128* 05/19/2014      Chemistry      Component Value Date/Time   NA 137 05/19/2014 1109   NA 139 11/08/2013 1524   K 4.5 05/19/2014 1109   K 4.1 11/08/2013 1524   CL 101 11/08/2013 1524   CO2 26 05/19/2014 1109   CO2 30 11/08/2013 1524   BUN 23.3 05/19/2014 1109   BUN 13 11/08/2013 1524   CREATININE 1.0 05/19/2014 1109   CREATININE 0.8 11/08/2013 1524      Component Value Date/Time   CALCIUM 9.2 05/19/2014 1109   CALCIUM 9.5 11/08/2013 1524   ALKPHOS 116 05/19/2014 1109   ALKPHOS 118* 11/08/2013 1524   AST 20 05/19/2014 1109   AST 20 11/08/2013 1524   ALT 23 05/19/2014 1109   ALT 19 11/08/2013 1524   BILITOT 0.26 05/19/2014 1109   BILITOT 0.6 11/08/2013 1524       RADIOGRAPHIC STUDIES: Nm Pet Image Restag (ps) Skull Base To Thigh  04/24/2014   CLINICAL DATA:  Subsequent treatment strategy for lung carcinoma. Concern for recurrence in the left  lower lobe.  EXAM: NUCLEAR MEDICINE PET SKULL BASE TO THIGH  TECHNIQUE: 11.3 mCi F-18 FDG was injected intravenously. Full-ring PET imaging was performed from the skull base to thigh after the radiotracer. CT data was obtained and used for attenuation correction and anatomic localization.  FASTING BLOOD GLUCOSE:  Value: 105 mg/dl  COMPARISON:  PET-CT 04/04/2012, CT 03/25/2004  FINDINGS: NECK  Hypermetabolic lymph nodes in the neck.  CHEST  Within the superior segment of the left lower lobe there is a thick-walled cavitary lesion measuring 4.2 cm with intense metabolic activity (SUV max = 11.9). This cavitary mass isa just inferior to the hilum and along the left lower lobe bronchus.  More peripherally and inferiorly in the left lower lobe there is a cluster of nodules measuring 4 cm in conglomerate also with metabolic activity (SUV max = 6.9).  There are hypermetabolic right and left lower lower paratracheal lymph nodes with SUV max equals 6.5. There is a hypermetabolic high right paratracheal lymph node (image 72 fused series)  ABDOMEN/PELVIS  No abnormal hypermetabolic  activity within the liver, pancreas, adrenal glands, or spleen. No hypermetabolic lymph nodes in the abdomen or pelvis.  Large hiatal hernia the posterior to the right heart.  SKELETON  No focal hypermetabolic activity to suggest skeletal metastasis.  IMPRESSION: 1. Hypermetabolic cavitary mass within the superior segment of left lower lobe is consistent lung cancer recurrence. 2. Hypermetabolic pulmonary nodules peripheral to this mass is concerning for lymphangitic spread of tumor. 3. Hypermetabolic contralateral and ipsilateral mediastinal nodal metastasis. 4. No evidence of more distant metastasis.   Electronically Signed   By: Suzy Bouchard M.D.   On: 04/24/2014 15:35    ASSESSMENT AND PLAN: This is a very pleasant 73 years old white male recently diagnosed with a stage IIIB non-small cell lung cancer, adenocarcinoma with large left lower lobe lung mass in addition to ipsilateral and contralateral mediastinal lymphadenopathy. CT of the head without contrast performed 05/05/2014 was negative for metastatic disease. Patient was discussed with an also seen by Dr. Julien Nordmann. As stated above due to the location of his tumor the radiation therapy is not an option at this time. Patient will begin systemic chemotherapy with carboplatin for an AUC of 5 and Alimta 500 mg per meter squared given every 3 weeks. First cycle of expected 05/26/2014. He'll be given a  B12 injection today. Prescriptions for folic acid 1 mg by mouth daily as well as dexamethasone 4 mg by mouth twice daily the day before, the day of, and the day after chemotherapy was sent to his pharmacy of record via [name]. scribed. He will have weekly labs consisting of a CBC differential and C. met. He'll return in 2 weeks for a symptom management visit after completing his first cycle of systemic chemotherapy with carboplatin and Alimta. He would like to proceed with treatment as planned and he gives a verbal consent.  The tissue block was sent for  mutational studies but there was insufficient tissue to perform the test. A serum sample will be sent for [employer] mutational studies analysis.   He was advised to call immediately if he has any concerning symptoms in the interval.  The patient voices understanding of current disease status and treatment options and is in agreement with the current care plan.  All questions were answered. The patient knows to call the clinic with any problems, questions or concerns. We can certainly see the patient much sooner if necessary.  Tyler [name], Tyler Mccarry E, PA-C  ADDENDUM: Hematology/Oncology attending: I had a face to face encounter with the patient. I recommended his care plan. This is a very pleasant 73 years old white male recently diagnosed with stage IIIB non-small cell lung cancer who was considered for a course of concurrent chemoradiation with weekly carboplatin and paclitaxel but unfortunately the tumor location and size was very prohibitive for Dr. Pablo Ledger to proceed with concurrent radiotherapy. I recommended for the patient to proceed with a course of systemic chemotherapy with carboplatin for AUC of 5 and Alimta 500 mg/M2 every 3 weeks. We'll schedule the first cycle of his treatment next week after the patient receive vitamin B12 injection today. We will call his pharmacy with prescription for Compazine, folic acid and Decadron for premedication. He would come back for followup visit in 2 weeks for reevaluation and management any adverse effect of his treatment. We will also arrange for the patient to have a blood sample sent for a molecular biomarker testing by [employer]. He was advised to call immediately if he has any concerning symptoms in the interval.  Disclaimer: This note was dictated with voice recognition software. Similar sounding words can inadvertently be transcribed and may not be corrected upon review. Eilleen Kempf., MD 05/21/2014

## 2014-05-19 NOTE — Telephone Encounter (Signed)
gv pt appt schedule for july/aug.

## 2014-05-20 ENCOUNTER — Ambulatory Visit: Payer: Medicare Other

## 2014-05-20 ENCOUNTER — Telehealth: Payer: Self-pay | Admitting: *Deleted

## 2014-05-20 NOTE — Telephone Encounter (Signed)
Pt's daughter called wanting information about the chemotherapy change.  Informed her about pt changing to [place] and advised she come to pt's next f/u appt to discuss any questions she has about pt's diagnosis on 8/4.  She verbalized understanding and is aware of the time.  SLJ

## 2014-05-21 ENCOUNTER — Ambulatory Visit: Payer: Medicare Other

## 2014-05-21 ENCOUNTER — Telehealth: Payer: Self-pay | Admitting: Internal Medicine

## 2014-05-21 NOTE — Telephone Encounter (Signed)
Spoke with [employer] with [place] of below per Dr Lamonte Sakai.  Nothing further needed.

## 2014-05-21 NOTE — Telephone Encounter (Signed)
Faxed pt medical records to [facility] [phone]

## 2014-05-21 NOTE — Telephone Encounter (Signed)
Don't send for now. i will discuss with Dr Julien Nordmann and order if indicated,.

## 2014-05-22 ENCOUNTER — Ambulatory Visit: Payer: Medicare Other

## 2014-05-23 ENCOUNTER — Ambulatory Visit: Payer: Medicare Other

## 2014-05-26 ENCOUNTER — Ambulatory Visit: Payer: Medicare Other

## 2014-05-26 ENCOUNTER — Other Ambulatory Visit (HOSPITAL_BASED_OUTPATIENT_CLINIC_OR_DEPARTMENT_OTHER): Payer: Medicare Other

## 2014-05-26 ENCOUNTER — Ambulatory Visit (HOSPITAL_BASED_OUTPATIENT_CLINIC_OR_DEPARTMENT_OTHER): Payer: Medicare Other

## 2014-05-26 VITALS — BP 151/68 | HR 64 | Temp 97.6°F | Resp 18

## 2014-05-26 DIAGNOSIS — C3430 Malignant neoplasm of lower lobe, unspecified bronchus or lung: Secondary | ICD-10-CM

## 2014-05-26 DIAGNOSIS — C3410 Malignant neoplasm of upper lobe, unspecified bronchus or lung: Secondary | ICD-10-CM

## 2014-05-26 DIAGNOSIS — Z5111 Encounter for antineoplastic chemotherapy: Secondary | ICD-10-CM

## 2014-05-26 DIAGNOSIS — C3492 Malignant neoplasm of unspecified part of left bronchus or lung: Secondary | ICD-10-CM

## 2014-05-26 LAB — CBC WITH DIFFERENTIAL/PLATELET
BASO%: 0.3 % (ref 0.0–2.0)
BASOS ABS: 0 10*3/uL (ref 0.0–0.1)
EOS%: 0 % (ref 0.0–7.0)
Eosinophils Absolute: 0 10*3/uL (ref 0.0–0.5)
HCT: 37.4 % — ABNORMAL LOW (ref 38.4–49.9)
HEMOGLOBIN: 12.4 g/dL — AB (ref 13.0–17.1)
LYMPH#: 0.9 10*3/uL (ref 0.9–3.3)
LYMPH%: 6.3 % — ABNORMAL LOW (ref 14.0–49.0)
MCH: 29.8 pg (ref 27.2–33.4)
MCHC: 33 g/dL (ref 32.0–36.0)
MCV: 90.2 fL (ref 79.3–98.0)
MONO#: 0.3 10*3/uL (ref 0.1–0.9)
MONO%: 1.8 % (ref 0.0–14.0)
NEUT#: 13 10*3/uL — ABNORMAL HIGH (ref 1.5–6.5)
NEUT%: 91.6 % — AB (ref 39.0–75.0)
Platelets: 196 10*3/uL (ref 140–400)
RBC: 4.14 10*6/uL — ABNORMAL LOW (ref 4.20–5.82)
RDW: 14.8 % — ABNORMAL HIGH (ref 11.0–14.6)
WBC: 14.2 10*3/uL — ABNORMAL HIGH (ref 4.0–10.3)

## 2014-05-26 LAB — COMPREHENSIVE METABOLIC PANEL (CC13)
ALK PHOS: 111 U/L (ref 40–150)
ALT: 16 U/L (ref 0–55)
AST: 14 U/L (ref 5–34)
Albumin: 3.7 g/dL (ref 3.5–5.0)
Anion Gap: 8 mEq/L (ref 3–11)
BUN: 21.4 mg/dL (ref 7.0–26.0)
CO2: 24 mEq/L (ref 22–29)
CREATININE: 0.9 mg/dL (ref 0.7–1.3)
Calcium: 9.7 mg/dL (ref 8.4–10.4)
Chloride: 106 mEq/L (ref 98–109)
Glucose: 154 mg/dl — ABNORMAL HIGH (ref 70–140)
Potassium: 4.7 mEq/L (ref 3.5–5.1)
Sodium: 138 mEq/L (ref 136–145)
Total Bilirubin: 0.31 mg/dL (ref 0.20–1.20)
Total Protein: 7.3 g/dL (ref 6.4–8.3)

## 2014-05-26 MED ORDER — PEMETREXED DISODIUM CHEMO INJECTION 500 MG
500.0000 mg/m2 | Freq: Once | INTRAVENOUS | Status: AC
  Administered 2014-05-26: 1150 mg via INTRAVENOUS
  Filled 2014-05-26: qty 46

## 2014-05-26 MED ORDER — ONDANSETRON 16 MG/50ML IVPB (CHCC)
INTRAVENOUS | Status: AC
  Filled 2014-05-26: qty 16

## 2014-05-26 MED ORDER — ONDANSETRON 16 MG/50ML IVPB (CHCC)
16.0000 mg | Freq: Once | INTRAVENOUS | Status: AC
  Administered 2014-05-26: 16 mg via INTRAVENOUS

## 2014-05-26 MED ORDER — DEXAMETHASONE SODIUM PHOSPHATE 20 MG/5ML IJ SOLN
INTRAMUSCULAR | Status: AC
  Filled 2014-05-26: qty 5

## 2014-05-26 MED ORDER — SODIUM CHLORIDE 0.9 % IV SOLN
Freq: Once | INTRAVENOUS | Status: AC
  Administered 2014-05-26: 12:00:00 via INTRAVENOUS

## 2014-05-26 MED ORDER — CARBOPLATIN CHEMO INJECTION 600 MG/60ML
609.0000 mg | Freq: Once | INTRAVENOUS | Status: AC
  Administered 2014-05-26: 610 mg via INTRAVENOUS
  Filled 2014-05-26: qty 61

## 2014-05-26 MED ORDER — DEXAMETHASONE SODIUM PHOSPHATE 20 MG/5ML IJ SOLN
20.0000 mg | Freq: Once | INTRAMUSCULAR | Status: AC
  Administered 2014-05-26: 20 mg via INTRAVENOUS

## 2014-05-26 NOTE — Patient Instructions (Signed)
[facility] Discharge Instructions for Patients Receiving Chemotherapy  Today you received the following chemotherapy agents: alimta, carboplatin  To help prevent nausea and vomiting after your treatment, we encourage you to take your nausea medication.  Take it as often as prescribed.     If you develop nausea and vomiting that is not controlled by your nausea medication, call the clinic. If it is after clinic hours your family physician or the after hours number for the clinic or go to the Emergency Department.   BELOW ARE SYMPTOMS THAT SHOULD BE REPORTED IMMEDIATELY:  *FEVER GREATER THAN 100.5 F  *CHILLS WITH OR WITHOUT FEVER  NAUSEA AND VOMITING THAT IS NOT CONTROLLED WITH YOUR NAUSEA MEDICATION  *UNUSUAL SHORTNESS OF BREATH  *UNUSUAL BRUISING OR BLEEDING  TENDERNESS IN MOUTH AND THROAT WITH OR WITHOUT PRESENCE OF ULCERS  *URINARY PROBLEMS  *BOWEL PROBLEMS  UNUSUAL RASH Items with * indicate a potential emergency and should be followed up as soon as possible.  Feel free to call the clinic you have any questions or concerns. The clinic phone number is [phone].   I have been informed and understand all the instructions given to me. I know to contact the clinic, my physician, or go to the Emergency Department if any problems should occur. I do not have any questions at this time, but understand that I may call the clinic during office hours   should I have any questions or need assistance in obtaining follow up care.    __________________________________________  _____________  __________ Signature of Patient or Authorized Representative            Date                   Time    __________________________________________ Nurse's Signature    Pemetrexed injection (Alimta)  What is this medicine? PEMETREXED (PEM e TREX ed) is a chemotherapy drug. This medicine affects cells that are rapidly growing, such as cancer cells and cells in your mouth  and stomach. It is usually used to treat lung cancers like non-small cell lung cancer and mesothelioma. It may also be used to treat other cancers. This medicine may be used for other purposes; ask your health care provider or pharmacist if you have questions. COMMON BRAND NAME(S): Alimta What should I tell my health care provider before I take this medicine? They need to know if you have any of these conditions: -if you frequently drink alcohol containing beverages -infection (especially a virus infection such as chickenpox, cold sores, or herpes) -kidney disease -liver disease -low blood counts, like low platelets, red bloods, or white blood cells -an unusual or allergic reaction to pemetrexed, mannitol, other medicines, foods, dyes, or preservatives -pregnant or trying to get pregnant -breast-feeding How should I use this medicine? This drug is given as an infusion into a vein. It is administered in a hospital or clinic by a specially trained health care professional. Talk to your pediatrician regarding the use of this medicine in children. Special care may be needed. Overdosage: If you think you have taken too much of this medicine contact a poison control center or emergency room at once. NOTE: This medicine is only for you. Do not share this medicine with others. What if I miss a dose? It is important not to miss your dose. Call your doctor or health care professional if you are unable to keep an appointment. What may interact with this medicine? -aspirin and aspirin-like medicines -medicines  to increase blood counts like filgrastim, pegfilgrastim, sargramostim -methotrexate -NSAIDS, medicines for pain and inflammation, like ibuprofen or naproxen -probenecid -pyrimethamine -vaccines Talk to your doctor or health care professional before taking any of these medicines: -acetaminophen -aspirin -ibuprofen -ketoprofen -naproxen This list may not describe all possible interactions.  Give your health care provider a list of all the medicines, herbs, non-prescription drugs, or dietary supplements you use. Also tell them if you smoke, drink alcohol, or use illegal drugs. Some items may interact with your medicine. What should I watch for while using this medicine? Visit your doctor for checks on your progress. This drug may make you feel generally unwell. This is not uncommon, as chemotherapy can affect healthy cells as well as cancer cells. Report any side effects. Continue your course of treatment even though you feel ill unless your doctor tells you to stop. In some cases, you may be given additional medicines to help with side effects. Follow all directions for their use. Call your doctor or health care professional for advice if you get a fever, chills or sore throat, or other symptoms of a cold or flu. Do not treat yourself. This drug decreases your body's ability to fight infections. Try to avoid being around people who are sick. This medicine may increase your risk to bruise or bleed. Call your doctor or health care professional if you notice any unusual bleeding. Be careful brushing and flossing your teeth or using a toothpick because you may get an infection or bleed more easily. If you have any dental work done, tell your dentist you are receiving this medicine. Avoid taking products that contain aspirin, acetaminophen, ibuprofen, naproxen, or ketoprofen unless instructed by your doctor. These medicines may hide a fever. Call your doctor or health care professional if you get diarrhea or mouth sores. Do not treat yourself. To protect your kidneys, drink water or other fluids as directed while you are taking this medicine. Men and women must use effective birth control while taking this medicine. You may also need to continue using effective birth control for a time after stopping this medicine. Do not become pregnant while taking this medicine. Tell your doctor right away if  you think that you or your partner might be pregnant. There is a potential for serious side effects to an unborn child. Talk to your health care professional or pharmacist for more information. Do not breast-feed an infant while taking this medicine. This medicine may lower sperm counts. What side effects may I notice from receiving this medicine? Side effects that you should report to your doctor or health care professional as soon as possible: -allergic reactions like skin rash, itching or hives, swelling of the face, lips, or tongue -low blood counts - this medicine may decrease the number of white blood cells, red blood cells and platelets. You may be at increased risk for infections and bleeding. -signs of infection - fever or chills, cough, sore throat, pain or difficulty passing urine -signs of decreased platelets or bleeding - bruising, pinpoint red spots on the skin, black, tarry stools, blood in the urine -signs of decreased red blood cells - unusually weak or tired, fainting spells, lightheadedness -breathing problems, like a dry cough -changes in emotions or moods -chest pain -confusion -diarrhea -high blood pressure -mouth or throat sores or ulcers -pain, swelling, warmth in the leg -pain on swallowing -swelling of the ankles, feet, hands -trouble passing urine or change in the amount of urine -vomiting -yellowing of the eyes  or skin Side effects that usually do not require medical attention (report to your doctor or health care professional if they continue or are bothersome): -hair loss -loss of appetite -nausea -stomach upset This list may not describe all possible side effects. Call your doctor for medical advice about side effects. You may report side effects to FDA at 1-800-FDA-1088. Where should I keep my medicine? This drug is given in a hospital or clinic and will not be stored at home. NOTE: This sheet is a summary. It may not cover all possible information. If you  have questions about this medicine, talk to your doctor, pharmacist, or health care provider.  2015, Elsevier/[employer] Standard. (2008-05-20 13:24:03)

## 2014-05-27 ENCOUNTER — Ambulatory Visit: Payer: Medicare Other

## 2014-05-27 ENCOUNTER — Telehealth: Payer: Self-pay | Admitting: *Deleted

## 2014-05-27 NOTE — Telephone Encounter (Signed)
Message copied by Cherylynn Ridges on Tue May 27, 2014 12:58 PM ------      Message from: Prentiss Bells      Created: Mon May 26, 2014  1:08 PM      Regarding: regimen change       Regimen change - 1st alimta ------

## 2014-05-27 NOTE — Telephone Encounter (Signed)
[place] for chemotherapy F/U.  Patient is doing well.  Denies n/v.  Denies any new side effects or symptoms.  Bowel and bladder is functioning well.  Eating well but not drinking as much water as he should.  Instructed to drink 64 oz minimum daily or at least the day before, of and after treatment.  Denies questions at this time and encouraged to call if needed.  Reviewed how to call after hours in the case of an emergency.

## 2014-05-28 ENCOUNTER — Ambulatory Visit: Payer: Medicare Other

## 2014-05-29 ENCOUNTER — Ambulatory Visit: Payer: Medicare Other

## 2014-05-30 ENCOUNTER — Ambulatory Visit: Payer: Medicare Other

## 2014-06-02 ENCOUNTER — Ambulatory Visit: Payer: Medicare Other

## 2014-06-02 ENCOUNTER — Other Ambulatory Visit: Payer: Medicare Other

## 2014-06-03 ENCOUNTER — Encounter: Payer: Self-pay | Admitting: Internal Medicine

## 2014-06-03 ENCOUNTER — Ambulatory Visit: Payer: Medicare Other

## 2014-06-03 ENCOUNTER — Telehealth: Payer: Self-pay | Admitting: Internal Medicine

## 2014-06-03 ENCOUNTER — Ambulatory Visit (HOSPITAL_BASED_OUTPATIENT_CLINIC_OR_DEPARTMENT_OTHER): Payer: Medicare Other | Admitting: Internal Medicine

## 2014-06-03 ENCOUNTER — Other Ambulatory Visit: Payer: Self-pay | Admitting: *Deleted

## 2014-06-03 ENCOUNTER — Other Ambulatory Visit (HOSPITAL_BASED_OUTPATIENT_CLINIC_OR_DEPARTMENT_OTHER): Payer: Medicare Other

## 2014-06-03 VITALS — BP 112/92 | HR 60 | Temp 97.8°F | Resp 18 | Ht 74.0 in | Wt 219.1 lb

## 2014-06-03 DIAGNOSIS — J4489 Other specified chronic obstructive pulmonary disease: Secondary | ICD-10-CM

## 2014-06-03 DIAGNOSIS — J449 Chronic obstructive pulmonary disease, unspecified: Secondary | ICD-10-CM

## 2014-06-03 DIAGNOSIS — C3430 Malignant neoplasm of lower lobe, unspecified bronchus or lung: Secondary | ICD-10-CM

## 2014-06-03 DIAGNOSIS — R599 Enlarged lymph nodes, unspecified: Secondary | ICD-10-CM

## 2014-06-03 DIAGNOSIS — C3432 Malignant neoplasm of lower lobe, left bronchus or lung: Secondary | ICD-10-CM

## 2014-06-03 DIAGNOSIS — C3402 Malignant neoplasm of left main bronchus: Secondary | ICD-10-CM

## 2014-06-03 DIAGNOSIS — C3492 Malignant neoplasm of unspecified part of left bronchus or lung: Secondary | ICD-10-CM

## 2014-06-03 LAB — CBC WITH DIFFERENTIAL/PLATELET
BASO%: 0.8 % (ref 0.0–2.0)
BASOS ABS: 0 10*3/uL (ref 0.0–0.1)
EOS ABS: 0.1 10*3/uL (ref 0.0–0.5)
EOS%: 3.5 % (ref 0.0–7.0)
HCT: 35 % — ABNORMAL LOW (ref 38.4–49.9)
HEMOGLOBIN: 11.6 g/dL — AB (ref 13.0–17.1)
LYMPH%: 36.4 % (ref 14.0–49.0)
MCH: 29.8 pg (ref 27.2–33.4)
MCHC: 33.1 g/dL (ref 32.0–36.0)
MCV: 89.9 fL (ref 79.3–98.0)
MONO#: 0.3 10*3/uL (ref 0.1–0.9)
MONO%: 6.6 % (ref 0.0–14.0)
NEUT%: 52.7 % (ref 39.0–75.0)
NEUTROS ABS: 2.2 10*3/uL (ref 1.5–6.5)
Platelets: 120 10*3/uL — ABNORMAL LOW (ref 140–400)
RBC: 3.89 10*6/uL — ABNORMAL LOW (ref 4.20–5.82)
RDW: 14.1 % (ref 11.0–14.6)
WBC: 4.1 10*3/uL (ref 4.0–10.3)
lymph#: 1.5 10*3/uL (ref 0.9–3.3)

## 2014-06-03 LAB — COMPREHENSIVE METABOLIC PANEL (CC13)
ALBUMIN: 3.5 g/dL (ref 3.5–5.0)
ALK PHOS: 96 U/L (ref 40–150)
ALT: 23 U/L (ref 0–55)
AST: 20 U/L (ref 5–34)
Anion Gap: 8 mEq/L (ref 3–11)
BUN: 24.6 mg/dL (ref 7.0–26.0)
CALCIUM: 9.1 mg/dL (ref 8.4–10.4)
CHLORIDE: 104 meq/L (ref 98–109)
CO2: 25 mEq/L (ref 22–29)
Creatinine: 1 mg/dL (ref 0.7–1.3)
GLUCOSE: 127 mg/dL (ref 70–140)
Potassium: 4.1 mEq/L (ref 3.5–5.1)
Sodium: 137 mEq/L (ref 136–145)
Total Bilirubin: 0.29 mg/dL (ref 0.20–1.20)
Total Protein: 6.6 g/dL (ref 6.4–8.3)

## 2014-06-03 MED ORDER — OXYCODONE-ACETAMINOPHEN 5-325 MG PO TABS: 1.0000 | ORAL_TABLET | ORAL | Status: DC | PRN

## 2014-06-03 NOTE — Progress Notes (Signed)
[place] Telephone:([phone]   Fax:(336) [place], [place] [address] [place] [address]  DIAGNOSIS: Stage IIIB (T2b, N3, M0) non-small cell lung cancer, adenocarcinoma, diagnosed in June of 2015 with large left lower lobe lung mass in addition to ipsilateral and contralateral mediastinal lymphadenopathy.  PRIOR THERAPY: None  CURRENT THERAPY: Systemic chemotherapy with carboplatin for AUC of 5 and Alimta 500 mg/M2 every 3 weeks but status post 1 cycle.  INTERVAL HISTORY: Tyler [name] 73 y.o. male returns to the clinic today for followup visit accompanied by his niece, Sula Soda. The patient is feeling fine today except for the baseline shortness of breath and he is currently on oxygen. He tolerated the first cycle of his treatment with carboplatin and Alimta fairly well except for mild fatigue. He continues to complain of pain on the left side of the chest but he is concerned about treatment with morphine or Percocet because of previous bad experience that he was taking a lot of pain medication simultaneously at that time.  He denied having any significant chest pain, cough or hemoptysis. He has no weight loss or night sweats. The patient denied having any significant nausea or vomiting, no fever or chills.   MEDICAL HISTORY: Past Medical History  Diagnosis Date  . Allergic rhinitis   . BPH (benign prostatic hyperplasia)   . GERD (gastroesophageal reflux disease)   . COPD (chronic obstructive pulmonary disease)   . Shortness of breath   . Arthritis   . Enlarged prostate   . Hypertension     dr Margreta Journey little in [place]   pcp      dr Stanford Breed  cardiac md  . Mental disorder   . Constipation   . Cancer Lung  . 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 Chronic chest pain   . Pneumonia     hx    ALLERGIES:  is allergic to ketorolac tromethamine; codeine; morphine and related; percocet; tramadol; and  tussionex pennkinetic er.  MEDICATIONS:  Current Outpatient Prescriptions  Medication Sig Dispense Refill  . albuterol (PROVENTIL) (2.5 MG/3ML) 0.083% nebulizer solution Take 3 mLs (2.5 mg total) by nebulization every 6 (six) hours as needed for wheezing.  75 mL  3  . dexamethasone (DECADRON) 4 MG tablet Take 1 tablet by mouth twice a day, the day before, the day of and the day after chemotherapy. Take with food  30 tablet  1  . flurazepam (DALMANE) 30 MG capsule Take 30 mg by mouth at bedtime as needed for sleep.      . folic acid (FOLVITE) 1 MG tablet Take 1 tablet (1 mg total) by mouth daily.  30 tablet  3  . naproxen (NAPROSYN) 500 MG tablet Take 500 mg by mouth 2 (two) times daily with a meal.      . nebivolol (BYSTOLIC) 10 MG tablet Take 10 mg by mouth 2 (two) times daily. 1 tab bid      . omeprazole (PRILOSEC) 20 MG capsule Take 20 mg by mouth 2 (two) times daily before a meal.       . prochlorperazine (COMPAZINE) 10 MG tablet Take 1 tablet (10 mg total) by mouth every 6 (six) hours as needed for nausea or vomiting.  30 tablet  1  . Tamsulosin HCl (FLOMAX) 0.4 MG CAPS Take 0.4 mg by mouth every morning.        No current facility-administered  medications for this visit.    SURGICAL HISTORY:  Past Surgical History  Procedure Laterality Date  . Ankle surgery  2005  . Prostate surgery  11/25/2010  . Hernia repair    . Mediastinoscopy  06/11/2012    Procedure: MEDIASTINOSCOPY;  Surgeon: Grace Isaac, MD;  Location: [facility];  Service: Thoracic;  Laterality: N/A;  . Inguinal hernia repair      left  . Eye surgery      cataract right  . Video bronchoscopy with endobronchial ultrasound  10/08/2012    Procedure: VIDEO BRONCHOSCOPY WITH ENDOBRONCHIAL ULTRASOUND;  Surgeon: Collene Gobble, MD;  Location: [facility];  Service: Pulmonary;  Laterality: N/A;  . Video bronchoscopy Bilateral 04/11/2014    Procedure: VIDEO BRONCHOSCOPY WITHOUT FLUORO;  Surgeon: Collene Gobble, MD;  Location: [facility];  Service: Cardiopulmonary;  Laterality: Bilateral;    REVIEW OF SYSTEMS:  Constitutional: positive for fatigue Eyes: negative Ears, nose, mouth, throat, and face: negative Respiratory: positive for dyspnea on exertion Cardiovascular: negative Gastrointestinal: negative Genitourinary:negative Integument/breast: negative Hematologic/lymphatic: negative Musculoskeletal:negative Neurological: negative Behavioral/Psych: negative Endocrine: negative Allergic/Immunologic: negative   PHYSICAL EXAMINATION: General appearance: alert, cooperative and no distress Head: Normocephalic, without obvious abnormality, atraumatic Neck: no adenopathy, no JVD, supple, symmetrical, trachea midline and thyroid not enlarged, symmetric, no tenderness/mass/nodules Lymph nodes: Cervical, supraclavicular, and axillary nodes normal. Resp: wheezes bilaterally Back: symmetric, no curvature. ROM normal. No CVA tenderness. Cardio: regular rate and rhythm, S1, S2 normal, no murmur, click, rub or gallop GI: soft, non-tender; bowel sounds normal; no masses,  no organomegaly Extremities: extremities normal, atraumatic, no cyanosis or edema Neurologic: Alert and oriented X 3, normal strength and tone. Normal symmetric reflexes. Normal coordination and gait  ECOG PERFORMANCE STATUS: 1 - Symptomatic but completely ambulatory  Blood pressure 112/92, pulse 60, temperature 97.8 F (36.6 C), temperature source Oral, resp. rate 18, height 6\' 2"  (1.88 m), weight 219 lb 1.6 oz (99.383 kg), SpO2 96.00%.  LABORATORY DATA: Lab Results  Component Value Date   WBC 4.1 06/03/2014   HGB 11.6* 06/03/2014   HCT 35.0* 06/03/2014   MCV 89.9 06/03/2014   PLT 120* 06/03/2014      Chemistry      Component Value Date/Time   NA 137 06/03/2014 1329   NA 139 11/08/2013 1524   K 4.1 06/03/2014 1329   K 4.1 11/08/2013 1524   CL 101 11/08/2013 1524   CO2 25 06/03/2014 1329   CO2 30 11/08/2013 1524   BUN 24.6 06/03/2014 1329   BUN 13 11/08/2013  1524   CREATININE 1.0 06/03/2014 1329   CREATININE 0.8 11/08/2013 1524      Component Value Date/Time   CALCIUM 9.1 06/03/2014 1329   CALCIUM 9.5 11/08/2013 1524   ALKPHOS 96 06/03/2014 1329   ALKPHOS 118* 11/08/2013 1524   AST 20 06/03/2014 1329   AST 20 11/08/2013 1524   ALT 23 06/03/2014 1329   ALT 19 11/08/2013 1524   BILITOT 0.29 06/03/2014 1329   BILITOT 0.6 11/08/2013 1524       RADIOGRAPHIC STUDIES:  ASSESSMENT AND PLAN: This is a very pleasant 73 years old white male recently diagnosed with a stage IIIB non-small cell lung cancer, adenocarcinoma with large left lower lobe lung mass in addition to ipsilateral and contralateral mediastinal lymphadenopathy. He was not a candidate for concurrent radiotherapy because of the large volume of treatment area.  He is currently undergoing systemic chemotherapy with carboplatin and Alimta status post 1 cycle and tolerated  the first cycle of his treatment fairly well. I recommended for the patient to continue his current treatment was carboplatin and Alimta as scheduled. For pain management he was given a refill of Percocet and the patient was advised to take it only on as-needed basis. The patient would come back for followup visit in 2 weeks for evaluation and management any adverse effect of his treatment. He was advised to call immediately if he has any concerning symptoms in the interval.  The patient voices understanding of current disease status and treatment options and is in agreement with the current care plan.  All questions were answered. The patient knows to call the clinic with any problems, questions or concerns. We can certainly see the patient much sooner if necessary.  Disclaimer: This note was dictated with voice recognition software. Similar sounding words can inadvertently be transcribed and may be missed upon review.

## 2014-06-03 NOTE — Telephone Encounter (Signed)
Per dr Vista Mink, okay to refill rx for percocet even with questionable intolerance noted.

## 2014-06-03 NOTE — Telephone Encounter (Signed)
gv pt appt schedule for aug/sept. message to MM re time for f/u with 8/17 chemo. no availability.

## 2014-06-04 ENCOUNTER — Ambulatory Visit: Payer: Medicare Other

## 2014-06-05 ENCOUNTER — Ambulatory Visit: Payer: Medicare Other

## 2014-06-05 ENCOUNTER — Telehealth: Payer: Self-pay | Admitting: Internal Medicine

## 2014-06-05 NOTE — Telephone Encounter (Signed)
PER STAFF MESSAGE RESPONSE FROM MM OK FOR PT TO SEE AJ 8/17. S/W PT RE NEW TIME FOR 8/17 AND PT WILL GET NEW SCHEDULE 8/10.

## 2014-06-06 ENCOUNTER — Ambulatory Visit: Payer: Medicare Other

## 2014-06-09 ENCOUNTER — Ambulatory Visit: Payer: Medicare Other

## 2014-06-09 ENCOUNTER — Other Ambulatory Visit (HOSPITAL_BASED_OUTPATIENT_CLINIC_OR_DEPARTMENT_OTHER): Payer: Medicare Other

## 2014-06-09 DIAGNOSIS — C3492 Malignant neoplasm of unspecified part of left bronchus or lung: Secondary | ICD-10-CM

## 2014-06-09 DIAGNOSIS — C3430 Malignant neoplasm of lower lobe, unspecified bronchus or lung: Secondary | ICD-10-CM

## 2014-06-09 LAB — CBC WITH DIFFERENTIAL/PLATELET
BASO%: 0.2 % (ref 0.0–2.0)
Basophils Absolute: 0 10*3/uL (ref 0.0–0.1)
EOS%: 4.7 % (ref 0.0–7.0)
Eosinophils Absolute: 0.2 10*3/uL (ref 0.0–0.5)
HCT: 31.9 % — ABNORMAL LOW (ref 38.4–49.9)
HEMOGLOBIN: 10.6 g/dL — AB (ref 13.0–17.1)
LYMPH%: 30 % (ref 14.0–49.0)
MCH: 30.5 pg (ref 27.2–33.4)
MCHC: 33.2 g/dL (ref 32.0–36.0)
MCV: 91.9 fL (ref 79.3–98.0)
MONO#: 0.3 10*3/uL (ref 0.1–0.9)
MONO%: 6.9 % (ref 0.0–14.0)
NEUT#: 2.5 10*3/uL (ref 1.5–6.5)
NEUT%: 58.2 % (ref 39.0–75.0)
Platelets: 66 10*3/uL — ABNORMAL LOW (ref 140–400)
RBC: 3.47 10*6/uL — AB (ref 4.20–5.82)
RDW: 13.8 % (ref 11.0–14.6)
WBC: 4.2 10*3/uL (ref 4.0–10.3)
lymph#: 1.3 10*3/uL (ref 0.9–3.3)

## 2014-06-10 ENCOUNTER — Ambulatory Visit: Payer: Medicare Other

## 2014-06-11 ENCOUNTER — Ambulatory Visit: Payer: Medicare Other

## 2014-06-12 ENCOUNTER — Ambulatory Visit: Payer: Medicare Other

## 2014-06-13 ENCOUNTER — Ambulatory Visit: Payer: Medicare Other

## 2014-06-16 ENCOUNTER — Other Ambulatory Visit (HOSPITAL_BASED_OUTPATIENT_CLINIC_OR_DEPARTMENT_OTHER): Payer: Medicare Other

## 2014-06-16 ENCOUNTER — Ambulatory Visit (HOSPITAL_BASED_OUTPATIENT_CLINIC_OR_DEPARTMENT_OTHER): Payer: Medicare Other

## 2014-06-16 ENCOUNTER — Ambulatory Visit: Payer: Medicare Other

## 2014-06-16 ENCOUNTER — Telehealth: Payer: Self-pay | Admitting: Internal Medicine

## 2014-06-16 ENCOUNTER — Ambulatory Visit (HOSPITAL_BASED_OUTPATIENT_CLINIC_OR_DEPARTMENT_OTHER): Payer: Medicare Other | Admitting: Physician Assistant

## 2014-06-16 ENCOUNTER — Telehealth: Payer: Self-pay | Admitting: *Deleted

## 2014-06-16 ENCOUNTER — Encounter: Payer: Self-pay | Admitting: Physician Assistant

## 2014-06-16 VITALS — BP 141/65 | HR 52 | Temp 97.7°F | Resp 17 | Ht 74.0 in | Wt 226.1 lb

## 2014-06-16 DIAGNOSIS — C3410 Malignant neoplasm of upper lobe, unspecified bronchus or lung: Secondary | ICD-10-CM

## 2014-06-16 DIAGNOSIS — C3492 Malignant neoplasm of unspecified part of left bronchus or lung: Secondary | ICD-10-CM

## 2014-06-16 DIAGNOSIS — C3430 Malignant neoplasm of lower lobe, unspecified bronchus or lung: Secondary | ICD-10-CM

## 2014-06-16 DIAGNOSIS — R599 Enlarged lymph nodes, unspecified: Secondary | ICD-10-CM

## 2014-06-16 DIAGNOSIS — C3490 Malignant neoplasm of unspecified part of unspecified bronchus or lung: Secondary | ICD-10-CM

## 2014-06-16 DIAGNOSIS — Z5111 Encounter for antineoplastic chemotherapy: Secondary | ICD-10-CM

## 2014-06-16 LAB — COMPREHENSIVE METABOLIC PANEL (CC13)
ALBUMIN: 3.4 g/dL — AB (ref 3.5–5.0)
ALT: 9 U/L (ref 0–55)
AST: 15 U/L (ref 5–34)
Alkaline Phosphatase: 94 U/L (ref 40–150)
Anion Gap: 8 mEq/L (ref 3–11)
BUN: 20.1 mg/dL (ref 7.0–26.0)
CALCIUM: 9 mg/dL (ref 8.4–10.4)
CO2: 26 mEq/L (ref 22–29)
Chloride: 104 mEq/L (ref 98–109)
Creatinine: 1.1 mg/dL (ref 0.7–1.3)
GLUCOSE: 113 mg/dL (ref 70–140)
Potassium: 4.5 mEq/L (ref 3.5–5.1)
Sodium: 139 mEq/L (ref 136–145)
Total Protein: 6.4 g/dL (ref 6.4–8.3)

## 2014-06-16 LAB — CBC WITH DIFFERENTIAL/PLATELET
BASO%: 0.5 % (ref 0.0–2.0)
Basophils Absolute: 0 10*3/uL (ref 0.0–0.1)
EOS ABS: 0.3 10*3/uL (ref 0.0–0.5)
EOS%: 6.5 % (ref 0.0–7.0)
HEMATOCRIT: 32.6 % — AB (ref 38.4–49.9)
HGB: 10.9 g/dL — ABNORMAL LOW (ref 13.0–17.1)
LYMPH%: 41.5 % (ref 14.0–49.0)
MCH: 31.1 pg (ref 27.2–33.4)
MCHC: 33.4 g/dL (ref 32.0–36.0)
MCV: 93.1 fL (ref 79.3–98.0)
MONO#: 0.5 10*3/uL (ref 0.1–0.9)
MONO%: 11.7 % (ref 0.0–14.0)
NEUT%: 39.8 % (ref 39.0–75.0)
NEUTROS ABS: 1.5 10*3/uL (ref 1.5–6.5)
PLATELETS: 155 10*3/uL (ref 140–400)
RBC: 3.5 10*6/uL — AB (ref 4.20–5.82)
RDW: 15.4 % — ABNORMAL HIGH (ref 11.0–14.6)
WBC: 3.9 10*3/uL — AB (ref 4.0–10.3)
lymph#: 1.6 10*3/uL (ref 0.9–3.3)

## 2014-06-16 MED ORDER — DEXAMETHASONE SODIUM PHOSPHATE 20 MG/5ML IJ SOLN
20.0000 mg | Freq: Once | INTRAMUSCULAR | Status: AC
  Administered 2014-06-16: 20 mg via INTRAVENOUS

## 2014-06-16 MED ORDER — ONDANSETRON 16 MG/50ML IVPB (CHCC)
16.0000 mg | Freq: Once | INTRAVENOUS | Status: AC
  Administered 2014-06-16: 16 mg via INTRAVENOUS

## 2014-06-16 MED ORDER — DEXAMETHASONE SODIUM PHOSPHATE 20 MG/5ML IJ SOLN
INTRAMUSCULAR | Status: AC
  Filled 2014-06-16: qty 5

## 2014-06-16 MED ORDER — SODIUM CHLORIDE 0.9 % IV SOLN
500.0000 mg/m2 | Freq: Once | INTRAVENOUS | Status: AC
  Administered 2014-06-16: 1150 mg via INTRAVENOUS
  Filled 2014-06-16: qty 46

## 2014-06-16 MED ORDER — SODIUM CHLORIDE 0.9 % IV SOLN
609.0000 mg | Freq: Once | INTRAVENOUS | Status: AC
  Administered 2014-06-16: 610 mg via INTRAVENOUS
  Filled 2014-06-16: qty 61

## 2014-06-16 MED ORDER — SODIUM CHLORIDE 0.9 % IV SOLN
Freq: Once | INTRAVENOUS | Status: AC
  Administered 2014-06-16: 14:00:00 via INTRAVENOUS

## 2014-06-16 MED ORDER — ONDANSETRON 16 MG/50ML IVPB (CHCC)
INTRAVENOUS | Status: AC
  Filled 2014-06-16: qty 16

## 2014-06-16 NOTE — Progress Notes (Signed)
[place] [phone]   Fax:(336) [place], MD [address] [place] [address]  DIAGNOSIS: Stage IIIB (T2b, N3, M0) non-small cell lung cancer, adenocarcinoma, diagnosed in June of 2015 with large left lower lobe lung mass in addition to ipsilateral and contralateral mediastinal lymphadenopathy.  PRIOR THERAPY: None  CURRENT THERAPY: Systemic chemotherapy with carboplatin for AUC of 5 and Alimta 500 mg/M2 every 3 weeks but status post 1 cycle.  INTERVAL HISTORY: Tyler [name] 73 y.o. male returns to the clinic today for followup visit accompanied by his niece, Sula Soda. The patient is feeling fine today except for the baseline shortness of breath and he is currently on oxygen. He tolerated the first cycle of his treatment with carboplatin and Alimta fairly well except for increased fatigue. He notes decreased appetite. He does admit to not drinking any water on a daily basis. He reports some dizziness over the last 3-4 days with occasionally seeing spots. He's had to sit down but has not had any syncopal episodes. He also reports some problems sleeping. He states he is no longer taking the Dalmane that was prescribed by his primary care physician Dr. Tamsen Roers.   He denied having any significant chest pain, cough or hemoptysis. He has no weight loss or night sweats. The patient denied having any significant nausea or vomiting, no fever or chills.   MEDICAL HISTORY: Past Medical History  Diagnosis Date  . Allergic rhinitis   . BPH (benign prostatic hyperplasia)   . GERD (gastroesophageal reflux disease)   . COPD (chronic obstructive pulmonary disease)   . Shortness of breath   . Arthritis   . Enlarged prostate   . Hypertension     dr Margreta Journey little in [place]   pcp      dr Stanford Breed  cardiac md  . Mental disorder   . Constipation   . Cancer Lung  . 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  Chronic chest pain   . Pneumonia     hx    ALLERGIES:  is allergic to ketorolac tromethamine; codeine; morphine and related; percocet; tramadol; and tussionex pennkinetic er.  MEDICATIONS:  Current Outpatient Prescriptions  Medication Sig Dispense Refill  . albuterol (PROVENTIL) (2.5 MG/3ML) 0.083% nebulizer solution Take 3 mLs (2.5 mg total) by nebulization every 6 (six) hours as needed for wheezing.  75 mL  3  . dexamethasone (DECADRON) 4 MG tablet Take 1 tablet by mouth twice a day, the day before, the day of and the day after chemotherapy. Take with food  30 tablet  1  . folic acid (FOLVITE) 1 MG tablet Take 1 tablet (1 mg total) by mouth daily.  30 tablet  3  . naproxen (NAPROSYN) 500 MG tablet Take 500 mg by mouth 2 (two) times daily with a meal.      . nebivolol (BYSTOLIC) 10 MG tablet Take 10 mg by mouth 2 (two) times daily. 1 tab bid      . omeprazole (PRILOSEC) 20 MG capsule Take 20 mg by mouth 2 (two) times daily before a meal.       . oxyCODONE-acetaminophen (PERCOCET/ROXICET) 5-325 MG per tablet Take 1 tablet by mouth every 4 (four) hours as needed for severe pain.  30 tablet  0  . prochlorperazine (COMPAZINE) 10 MG tablet Take 1 tablet (10 mg total) by mouth every 6 (six) hours as needed for nausea  or vomiting.  30 tablet  1  . Tamsulosin HCl (FLOMAX) 0.4 MG CAPS Take 0.4 mg by mouth every morning.       . flurazepam (DALMANE) 30 MG capsule Take 30 mg by mouth at bedtime as needed for sleep.       No current facility-administered medications for this visit.    SURGICAL HISTORY:  Past Surgical History  Procedure Laterality Date  . Ankle surgery  2005  . Prostate surgery  11/25/2010  . Hernia repair    . Mediastinoscopy  06/11/2012    Procedure: MEDIASTINOSCOPY;  Surgeon: Grace Isaac, MD;  Location: [facility];  Service: Thoracic;  Laterality: N/A;  . Inguinal hernia repair      left  . Eye surgery      cataract right  . Video bronchoscopy with endobronchial ultrasound   10/08/2012    Procedure: VIDEO BRONCHOSCOPY WITH ENDOBRONCHIAL ULTRASOUND;  Surgeon: Collene Gobble, MD;  Location: [facility];  Service: Pulmonary;  Laterality: N/A;  . Video bronchoscopy Bilateral 04/11/2014    Procedure: VIDEO BRONCHOSCOPY WITHOUT FLUORO;  Surgeon: Collene Gobble, MD;  Location: [facility];  Service: Cardiopulmonary;  Laterality: Bilateral;    REVIEW OF SYSTEMS:  Constitutional: positive for anorexia and fatigue Eyes: negative Ears, nose, mouth, throat, and face: negative Respiratory: positive for dyspnea on exertion Cardiovascular: negative Gastrointestinal: negative Genitourinary:negative Integument/breast: negative Hematologic/lymphatic: negative Musculoskeletal:negative Neurological: positive for dizziness Behavioral/Psych: negative Endocrine: negative Allergic/Immunologic: negative   PHYSICAL EXAMINATION: General appearance: alert, cooperative and no distress Head: Normocephalic, without obvious abnormality, atraumatic Neck: no adenopathy, no JVD, supple, symmetrical, trachea midline and thyroid not enlarged, symmetric, no tenderness/mass/nodules Lymph nodes: Cervical, supraclavicular, and axillary nodes normal. Resp: wheezes bilaterally Back: symmetric, no curvature. ROM normal. No CVA tenderness. Cardio: regular rate and rhythm, S1, S2 normal, no murmur, click, rub or gallop GI: soft, non-tender; bowel sounds normal; no masses,  no organomegaly Extremities: extremities normal, atraumatic, no cyanosis or edema Neurologic: Alert and oriented X 3, normal strength and tone. Normal symmetric reflexes. Normal coordination and gait  ECOG PERFORMANCE STATUS: 1 - Symptomatic but completely ambulatory  Blood pressure 141/65, pulse 52, temperature 97.7 F (36.5 C), temperature source Oral, resp. rate 17, height 6\' 2"  (1.88 m), weight 226 lb 1.6 oz (102.558 kg), SpO2 96.00%.  LABORATORY DATA: Lab Results  Component Value Date   WBC 3.9* 06/16/2014   HGB 10.9*  06/16/2014   HCT 32.6* 06/16/2014   MCV 93.1 06/16/2014   PLT 155 06/16/2014      Chemistry      Component Value Date/Time   NA 139 06/16/2014 1156   NA 139 11/08/2013 1524   K 4.5 06/16/2014 1156   K 4.1 11/08/2013 1524   CL 101 11/08/2013 1524   CO2 26 06/16/2014 1156   CO2 30 11/08/2013 1524   BUN 20.1 06/16/2014 1156   BUN 13 11/08/2013 1524   CREATININE 1.1 06/16/2014 1156   CREATININE 0.8 11/08/2013 1524      Component Value Date/Time   CALCIUM 9.0 06/16/2014 1156   CALCIUM 9.5 11/08/2013 1524   ALKPHOS 94 06/16/2014 1156   ALKPHOS 118* 11/08/2013 1524   AST 15 06/16/2014 1156   AST 20 11/08/2013 1524   ALT 9 06/16/2014 1156   ALT 19 11/08/2013 1524   BILITOT <0.20 06/16/2014 1156   BILITOT 0.6 11/08/2013 1524       RADIOGRAPHIC STUDIES:  ASSESSMENT AND PLAN: This is a very pleasant 73 years old white male recently  diagnosed with a stage IIIB non-small cell lung cancer, adenocarcinoma with large left lower lobe lung mass in addition to ipsilateral and contralateral mediastinal lymphadenopathy. He was not a candidate for concurrent radiotherapy because of the large volume of treatment area.  He is currently undergoing systemic chemotherapy with carboplatin and Alimta status post 1 cycle and tolerated the first cycle of his treatment fairly well with the option of increased fatigue. The patient was reviewed with Dr. Julien Nordmann. He'll proceed with cycle #2 of his systemic chemotherapy with carboplatin and Alimta today as scheduled. I suspect that his brief episodes of dizziness may be related to dehydration. He was encouraged to increase his by mouth intake of fluids and food. Of note he has gained just under 7 pounds in the past 2 weeks. He'll continue with weekly labs as scheduled. He'll return in 3 weeks prior to the start of cycle #3. Regarding his sleeping issues patient was advised to contact his primary care physician.  He was advised to call immediately if he has any concerning symptoms in the  interval.  The patient voices understanding of current disease status and treatment options and is in agreement with the current care plan.  All questions were answered. The patient knows to call the clinic with any problems, questions or concerns. We can certainly see the patient much sooner if necessary.  Carlton Adam, PA-C   Disclaimer: This note was dictated with voice recognition software. Similar sounding words can inadvertently be transcribed and may be missed upon review.

## 2014-06-16 NOTE — Telephone Encounter (Signed)
Per staff message and POF I have scheduled appts. Advised scheduler of appts. I also adjusted 9/7 appt   MW

## 2014-06-16 NOTE — Telephone Encounter (Signed)
Pt confirmed labs/ov per 08/17 POF, gave pt AVS.....KJ °

## 2014-06-16 NOTE — Patient Instructions (Signed)
[facility] Discharge Instructions for Patients Receiving Chemotherapy  Today you received the following chemotherapy agents Alimta/Carboplatin.  To help prevent nausea and vomiting after your treatment, we encourage you to take your nausea medication as directed.    If you develop nausea and vomiting that is not controlled by your nausea medication, call the clinic.   BELOW ARE SYMPTOMS THAT SHOULD BE REPORTED IMMEDIATELY:  *FEVER GREATER THAN 100.5 F  *CHILLS WITH OR WITHOUT FEVER  NAUSEA AND VOMITING THAT IS NOT CONTROLLED WITH YOUR NAUSEA MEDICATION  *UNUSUAL SHORTNESS OF BREATH  *UNUSUAL BRUISING OR BLEEDING  TENDERNESS IN MOUTH AND THROAT WITH OR WITHOUT PRESENCE OF ULCERS  *URINARY PROBLEMS  *BOWEL PROBLEMS  UNUSUAL RASH Items with * indicate a potential emergency and should be followed up as soon as possible.  Feel free to call the clinic you have any questions or concerns. The clinic phone number is [phone].

## 2014-06-17 ENCOUNTER — Ambulatory Visit: Payer: Medicare Other

## 2014-06-17 NOTE — Patient Instructions (Signed)
Continue weekly labs as scheduled Increase your daily fluid intake Followup in 3 weeks prior to the start of her next scheduled cycle of chemotherapy Contact her primary care physician regarding your of difficulty sleeping and fact that the Dalmane is no longer working for you

## 2014-06-18 ENCOUNTER — Ambulatory Visit: Payer: Medicare Other

## 2014-06-19 ENCOUNTER — Ambulatory Visit: Payer: Medicare Other

## 2014-06-20 ENCOUNTER — Ambulatory Visit: Payer: Medicare Other

## 2014-06-23 ENCOUNTER — Ambulatory Visit: Payer: Medicare Other

## 2014-06-23 ENCOUNTER — Other Ambulatory Visit (HOSPITAL_BASED_OUTPATIENT_CLINIC_OR_DEPARTMENT_OTHER): Payer: Medicare Other

## 2014-06-23 DIAGNOSIS — C3490 Malignant neoplasm of unspecified part of unspecified bronchus or lung: Secondary | ICD-10-CM

## 2014-06-23 DIAGNOSIS — C3430 Malignant neoplasm of lower lobe, unspecified bronchus or lung: Secondary | ICD-10-CM

## 2014-06-23 LAB — COMPREHENSIVE METABOLIC PANEL (CC13)
ALK PHOS: 103 U/L (ref 40–150)
ALT: 14 U/L (ref 0–55)
ANION GAP: 9 meq/L (ref 3–11)
AST: 21 U/L (ref 5–34)
Albumin: 3.6 g/dL (ref 3.5–5.0)
BILIRUBIN TOTAL: 0.37 mg/dL (ref 0.20–1.20)
BUN: 24.7 mg/dL (ref 7.0–26.0)
CO2: 27 meq/L (ref 22–29)
CREATININE: 1.1 mg/dL (ref 0.7–1.3)
Calcium: 9.1 mg/dL (ref 8.4–10.4)
Chloride: 102 mEq/L (ref 98–109)
Glucose: 100 mg/dl (ref 70–140)
Potassium: 4.1 mEq/L (ref 3.5–5.1)
Sodium: 139 mEq/L (ref 136–145)
TOTAL PROTEIN: 7 g/dL (ref 6.4–8.3)

## 2014-06-23 LAB — CBC WITH DIFFERENTIAL/PLATELET
BASO%: 1.1 % (ref 0.0–2.0)
BASOS ABS: 0 10*3/uL (ref 0.0–0.1)
EOS ABS: 0.1 10*3/uL (ref 0.0–0.5)
EOS%: 3.7 % (ref 0.0–7.0)
HEMATOCRIT: 33.3 % — AB (ref 38.4–49.9)
HEMOGLOBIN: 11 g/dL — AB (ref 13.0–17.1)
LYMPH%: 56.8 % — ABNORMAL HIGH (ref 14.0–49.0)
MCH: 30.8 pg (ref 27.2–33.4)
MCHC: 33.1 g/dL (ref 32.0–36.0)
MCV: 93 fL (ref 79.3–98.0)
MONO#: 0.1 10*3/uL (ref 0.1–0.9)
MONO%: 4.5 % (ref 0.0–14.0)
NEUT%: 33.9 % — AB (ref 39.0–75.0)
NEUTROS ABS: 0.6 10*3/uL — AB (ref 1.5–6.5)
Platelets: 149 10*3/uL (ref 140–400)
RBC: 3.58 10*6/uL — ABNORMAL LOW (ref 4.20–5.82)
RDW: 15.3 % — AB (ref 11.0–14.6)
WBC: 1.8 10*3/uL — ABNORMAL LOW (ref 4.0–10.3)
lymph#: 1 10*3/uL (ref 0.9–3.3)

## 2014-06-24 ENCOUNTER — Ambulatory Visit: Payer: Medicare Other

## 2014-06-30 ENCOUNTER — Other Ambulatory Visit (HOSPITAL_BASED_OUTPATIENT_CLINIC_OR_DEPARTMENT_OTHER): Payer: Medicare Other

## 2014-06-30 DIAGNOSIS — C3490 Malignant neoplasm of unspecified part of unspecified bronchus or lung: Secondary | ICD-10-CM

## 2014-06-30 DIAGNOSIS — C3430 Malignant neoplasm of lower lobe, unspecified bronchus or lung: Secondary | ICD-10-CM

## 2014-06-30 DIAGNOSIS — R599 Enlarged lymph nodes, unspecified: Secondary | ICD-10-CM

## 2014-06-30 LAB — COMPREHENSIVE METABOLIC PANEL (CC13)
ALT: 14 U/L (ref 0–55)
ANION GAP: 7 meq/L (ref 3–11)
AST: 15 U/L (ref 5–34)
Albumin: 3.7 g/dL (ref 3.5–5.0)
Alkaline Phosphatase: 101 U/L (ref 40–150)
BILIRUBIN TOTAL: 0.4 mg/dL (ref 0.20–1.20)
BUN: 18.7 mg/dL (ref 7.0–26.0)
CO2: 29 mEq/L (ref 22–29)
CREATININE: 1 mg/dL (ref 0.7–1.3)
Calcium: 9.2 mg/dL (ref 8.4–10.4)
Chloride: 101 mEq/L (ref 98–109)
Glucose: 109 mg/dl (ref 70–140)
Potassium: 4.3 mEq/L (ref 3.5–5.1)
Sodium: 137 mEq/L (ref 136–145)
Total Protein: 6.9 g/dL (ref 6.4–8.3)

## 2014-06-30 LAB — CBC WITH DIFFERENTIAL/PLATELET
BASO%: 0 % (ref 0.0–2.0)
BASOS ABS: 0 10*3/uL (ref 0.0–0.1)
EOS%: 1.3 % (ref 0.0–7.0)
Eosinophils Absolute: 0.1 10*3/uL (ref 0.0–0.5)
HCT: 30.2 % — ABNORMAL LOW (ref 38.4–49.9)
HEMOGLOBIN: 10.4 g/dL — AB (ref 13.0–17.1)
LYMPH#: 1.3 10*3/uL (ref 0.9–3.3)
LYMPH%: 35.5 % (ref 14.0–49.0)
MCH: 31.1 pg (ref 27.2–33.4)
MCHC: 34.4 g/dL (ref 32.0–36.0)
MCV: 90.4 fL (ref 79.3–98.0)
MONO#: 0.5 10*3/uL (ref 0.1–0.9)
MONO%: 12.5 % (ref 0.0–14.0)
NEUT#: 1.9 10*3/uL (ref 1.5–6.5)
NEUT%: 50.7 % (ref 39.0–75.0)
PLATELETS: 57 10*3/uL — AB (ref 140–400)
RBC: 3.34 10*6/uL — ABNORMAL LOW (ref 4.20–5.82)
RDW: 14.5 % (ref 11.0–14.6)
WBC: 3.8 10*3/uL — ABNORMAL LOW (ref 4.0–10.3)

## 2014-07-08 ENCOUNTER — Telehealth: Payer: Self-pay | Admitting: *Deleted

## 2014-07-08 ENCOUNTER — Ambulatory Visit (HOSPITAL_BASED_OUTPATIENT_CLINIC_OR_DEPARTMENT_OTHER): Payer: Medicare Other | Admitting: Internal Medicine

## 2014-07-08 ENCOUNTER — Telehealth: Payer: Self-pay | Admitting: Internal Medicine

## 2014-07-08 ENCOUNTER — Other Ambulatory Visit (HOSPITAL_BASED_OUTPATIENT_CLINIC_OR_DEPARTMENT_OTHER): Payer: Medicare Other

## 2014-07-08 ENCOUNTER — Ambulatory Visit (HOSPITAL_BASED_OUTPATIENT_CLINIC_OR_DEPARTMENT_OTHER): Payer: Medicare Other

## 2014-07-08 ENCOUNTER — Telehealth: Payer: Self-pay | Admitting: Dietician

## 2014-07-08 VITALS — BP 123/61 | HR 57 | Temp 97.6°F | Resp 16 | Ht 74.0 in | Wt 219.0 lb

## 2014-07-08 DIAGNOSIS — C3430 Malignant neoplasm of lower lobe, unspecified bronchus or lung: Secondary | ICD-10-CM

## 2014-07-08 DIAGNOSIS — C3490 Malignant neoplasm of unspecified part of unspecified bronchus or lung: Secondary | ICD-10-CM

## 2014-07-08 DIAGNOSIS — Z23 Encounter for immunization: Secondary | ICD-10-CM

## 2014-07-08 DIAGNOSIS — C3410 Malignant neoplasm of upper lobe, unspecified bronchus or lung: Secondary | ICD-10-CM

## 2014-07-08 DIAGNOSIS — R599 Enlarged lymph nodes, unspecified: Secondary | ICD-10-CM

## 2014-07-08 DIAGNOSIS — Z5111 Encounter for antineoplastic chemotherapy: Secondary | ICD-10-CM

## 2014-07-08 LAB — CBC WITH DIFFERENTIAL/PLATELET
BASO%: 0.4 % (ref 0.0–2.0)
BASOS ABS: 0 10*3/uL (ref 0.0–0.1)
EOS%: 0.3 % (ref 0.0–7.0)
Eosinophils Absolute: 0 10*3/uL (ref 0.0–0.5)
HEMATOCRIT: 32.9 % — AB (ref 38.4–49.9)
HEMOGLOBIN: 11 g/dL — AB (ref 13.0–17.1)
LYMPH#: 0.6 10*3/uL — AB (ref 0.9–3.3)
LYMPH%: 9.6 % — ABNORMAL LOW (ref 14.0–49.0)
MCH: 31.4 pg (ref 27.2–33.4)
MCHC: 33.5 g/dL (ref 32.0–36.0)
MCV: 93.7 fL (ref 79.3–98.0)
MONO#: 0.1 10*3/uL (ref 0.1–0.9)
MONO%: 2.2 % (ref 0.0–14.0)
NEUT#: 5.4 10*3/uL (ref 1.5–6.5)
NEUT%: 87.5 % — ABNORMAL HIGH (ref 39.0–75.0)
Platelets: 220 10*3/uL (ref 140–400)
RBC: 3.51 10*6/uL — ABNORMAL LOW (ref 4.20–5.82)
RDW: 17.7 % — AB (ref 11.0–14.6)
WBC: 6.1 10*3/uL (ref 4.0–10.3)

## 2014-07-08 LAB — COMPREHENSIVE METABOLIC PANEL (CC13)
ALK PHOS: 102 U/L (ref 40–150)
ALT: 11 U/L (ref 0–55)
AST: 19 U/L (ref 5–34)
Albumin: 3.8 g/dL (ref 3.5–5.0)
Anion Gap: 9 mEq/L (ref 3–11)
BILIRUBIN TOTAL: 0.26 mg/dL (ref 0.20–1.20)
BUN: 20.9 mg/dL (ref 7.0–26.0)
CHLORIDE: 102 meq/L (ref 98–109)
CO2: 25 mEq/L (ref 22–29)
Calcium: 9.5 mg/dL (ref 8.4–10.4)
Creatinine: 1.1 mg/dL (ref 0.7–1.3)
Glucose: 126 mg/dl (ref 70–140)
POTASSIUM: 5.1 meq/L (ref 3.5–5.1)
Sodium: 136 mEq/L (ref 136–145)
TOTAL PROTEIN: 7.2 g/dL (ref 6.4–8.3)

## 2014-07-08 MED ORDER — DEXAMETHASONE SODIUM PHOSPHATE 20 MG/5ML IJ SOLN
20.0000 mg | Freq: Once | INTRAMUSCULAR | Status: AC
  Administered 2014-07-08: 20 mg via INTRAVENOUS

## 2014-07-08 MED ORDER — CYANOCOBALAMIN 1000 MCG/ML IJ SOLN
INTRAMUSCULAR | Status: AC
  Filled 2014-07-08: qty 1

## 2014-07-08 MED ORDER — SODIUM CHLORIDE 0.9 % IV SOLN: 609.0000 mg | Freq: Once | INTRAVENOUS | Status: DC

## 2014-07-08 MED ORDER — PEMETREXED DISODIUM CHEMO INJECTION 500 MG
500.0000 mg/m2 | Freq: Once | INTRAVENOUS | Status: AC
  Administered 2014-07-08: 1150 mg via INTRAVENOUS
  Filled 2014-07-08: qty 46

## 2014-07-08 MED ORDER — DEXAMETHASONE SODIUM PHOSPHATE 20 MG/5ML IJ SOLN
INTRAMUSCULAR | Status: AC
  Filled 2014-07-08: qty 5

## 2014-07-08 MED ORDER — INFLUENZA VAC SPLIT QUAD 0.5 ML IM SUSY
0.5000 mL | PREFILLED_SYRINGE | Freq: Once | INTRAMUSCULAR | Status: AC
  Administered 2014-07-08: 0.5 mL via INTRAMUSCULAR
  Filled 2014-07-08: qty 0.5

## 2014-07-08 MED ORDER — CYANOCOBALAMIN 1000 MCG/ML IJ SOLN
1000.0000 ug | Freq: Once | INTRAMUSCULAR | Status: AC
  Administered 2014-07-08: 1000 ug via INTRAMUSCULAR

## 2014-07-08 MED ORDER — ONDANSETRON 16 MG/50ML IVPB (CHCC)
INTRAVENOUS | Status: AC
  Filled 2014-07-08: qty 16

## 2014-07-08 MED ORDER — CARBOPLATIN CHEMO INJECTION 600 MG/60ML
550.0000 mg | Freq: Once | INTRAVENOUS | Status: AC
  Administered 2014-07-08: 550 mg via INTRAVENOUS
  Filled 2014-07-08: qty 55

## 2014-07-08 MED ORDER — ONDANSETRON HCL 8 MG PO TABS: 8.0000 mg | ORAL_TABLET | Freq: Three times a day (TID) | ORAL | Status: DC | PRN

## 2014-07-08 MED ORDER — ONDANSETRON 16 MG/50ML IVPB (CHCC)
16.0000 mg | Freq: Once | INTRAVENOUS | Status: AC
  Administered 2014-07-08: 16 mg via INTRAVENOUS

## 2014-07-08 MED ORDER — SODIUM CHLORIDE 0.9 % IV SOLN
Freq: Once | INTRAVENOUS | Status: AC
  Administered 2014-07-08: 12:00:00 via INTRAVENOUS

## 2014-07-08 NOTE — Progress Notes (Signed)
[place] [phone]   Fax:(336) [place], MD [address] [place] [address]  DIAGNOSIS: Stage IIIB (T2b, N3, M0) non-small cell lung cancer, adenocarcinoma, diagnosed in June of 2015 with large left lower lobe lung mass in addition to ipsilateral and contralateral mediastinal lymphadenopathy.  PRIOR THERAPY: None  CURRENT THERAPY: Systemic chemotherapy with carboplatin for AUC of 5 and Alimta 500 mg/M2 every 3 weeks but status post 2 cycles.  INTERVAL HISTORY: CHRITOPHER COSTER 73 y.o. male returns to the clinic today for followup visit accompanied by his wife. He tolerated the second cycle of his systemic chemotherapy with carboplatin and Alimta fairly well except for mild fatigue as well as nausea not improving with Compazine. He continues to have baseline shortness of breath and currently on home oxygen. He denied having any significant chest pain, cough or hemoptysis. He has no weight loss or night sweats. The patient denied having any significant vomiting, no fever or chills. He is here today to start cycle #3 of chemotherapy.  MEDICAL HISTORY: Past Medical History  Diagnosis Date  . Allergic rhinitis   . BPH (benign prostatic hyperplasia)   . GERD (gastroesophageal reflux disease)   . COPD (chronic obstructive pulmonary disease)   . Shortness of breath   . Arthritis   . Enlarged prostate   . Hypertension     dr Margreta Journey little in [place]   pcp      dr Stanford Breed  cardiac md  . Mental disorder   . Constipation   . Cancer Lung  . 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 Chronic chest pain   . Pneumonia     hx    ALLERGIES:  is allergic to ketorolac tromethamine; codeine; morphine and related; percocet; tramadol; and tussionex pennkinetic er.  MEDICATIONS:  Current Outpatient Prescriptions  Medication Sig Dispense Refill  . albuterol (PROVENTIL) (2.5 MG/3ML) 0.083% nebulizer solution  Take 3 mLs (2.5 mg total) by nebulization every 6 (six) hours as needed for wheezing.  75 mL  3  . dexamethasone (DECADRON) 4 MG tablet Take 1 tablet by mouth twice a day, the day before, the day of and the day after chemotherapy. Take with food  30 tablet  1  . folic acid (FOLVITE) 1 MG tablet Take 1 tablet (1 mg total) by mouth daily.  30 tablet  3  . naproxen (NAPROSYN) 500 MG tablet Take 500 mg by mouth 2 (two) times daily with a meal.      . nebivolol (BYSTOLIC) 10 MG tablet Take 10 mg by mouth 2 (two) times daily. 1 tab bid      . omeprazole (PRILOSEC) 20 MG capsule Take 20 mg by mouth 2 (two) times daily before a meal.       . oxyCODONE-acetaminophen (PERCOCET/ROXICET) 5-325 MG per tablet Take 1 tablet by mouth every 4 (four) hours as needed for severe pain.  30 tablet  0  . prochlorperazine (COMPAZINE) 10 MG tablet Take 1 tablet (10 mg total) by mouth every 6 (six) hours as needed for nausea or vomiting.  30 tablet  1  . Tamsulosin HCl (FLOMAX) 0.4 MG CAPS Take 0.4 mg by mouth every morning.       . flurazepam (DALMANE) 30 MG capsule Take 30 mg by mouth at bedtime as needed for sleep.       Current Facility-Administered Medications  Medication Dose Route Frequency Provider  Last Rate Last Dose  . Influenza vac split quadrivalent PF (FLUARIX) injection 0.5 mL  0.5 mL Intramuscular Once Curt Bears, MD        SURGICAL HISTORY:  Past Surgical History  Procedure Laterality Date  . Ankle surgery  2005  . Prostate surgery  11/25/2010  . Hernia repair    . Mediastinoscopy  06/11/2012    Procedure: MEDIASTINOSCOPY;  Surgeon: Grace Isaac, MD;  Location: [facility];  Service: Thoracic;  Laterality: N/A;  . Inguinal hernia repair      left  . Eye surgery      cataract right  . Video bronchoscopy with endobronchial ultrasound  10/08/2012    Procedure: VIDEO BRONCHOSCOPY WITH ENDOBRONCHIAL ULTRASOUND;  Surgeon: Collene Gobble, MD;  Location: [facility];  Service: Pulmonary;  Laterality: N/A;  .  Video bronchoscopy Bilateral 04/11/2014    Procedure: VIDEO BRONCHOSCOPY WITHOUT FLUORO;  Surgeon: Collene Gobble, MD;  Location: [facility];  Service: Cardiopulmonary;  Laterality: Bilateral;    REVIEW OF SYSTEMS:  Constitutional: positive for fatigue Eyes: negative Ears, nose, mouth, throat, and face: negative Respiratory: positive for dyspnea on exertion Cardiovascular: negative Gastrointestinal: negative Genitourinary:negative Integument/breast: negative Hematologic/lymphatic: negative Musculoskeletal:negative Neurological: negative Behavioral/Psych: negative Endocrine: negative Allergic/Immunologic: negative   PHYSICAL EXAMINATION: General appearance: alert, cooperative and no distress Head: Normocephalic, without obvious abnormality, atraumatic Neck: no adenopathy, no JVD, supple, symmetrical, trachea midline and thyroid not enlarged, symmetric, no tenderness/mass/nodules Lymph nodes: Cervical, supraclavicular, and axillary nodes normal. Resp: wheezes bilaterally Back: symmetric, no curvature. ROM normal. No CVA tenderness. Cardio: regular rate and rhythm, S1, S2 normal, no murmur, click, rub or gallop GI: soft, non-tender; bowel sounds normal; no masses,  no organomegaly Extremities: extremities normal, atraumatic, no cyanosis or edema Neurologic: Alert and oriented X 3, normal strength and tone. Normal symmetric reflexes. Normal coordination and gait  ECOG PERFORMANCE STATUS: 1 - Symptomatic but completely ambulatory  Blood pressure 123/61, pulse 57, temperature 97.6 F (36.4 C), temperature source Oral, resp. rate 16, height 6\' 2"  (1.88 m), weight 219 lb (99.338 kg), SpO2 93.00%.  LABORATORY DATA: Lab Results  Component Value Date   WBC 6.1 07/08/2014   HGB 11.0* 07/08/2014   HCT 32.9* 07/08/2014   MCV 93.7 07/08/2014   PLT 220 07/08/2014      Chemistry      Component Value Date/Time   NA 137 06/30/2014 1052   NA 139 11/08/2013 1524   K 4.3 06/30/2014 1052   K 4.1  11/08/2013 1524   CL 101 11/08/2013 1524   CO2 29 06/30/2014 1052   CO2 30 11/08/2013 1524   BUN 18.7 06/30/2014 1052   BUN 13 11/08/2013 1524   CREATININE 1.0 06/30/2014 1052   CREATININE 0.8 11/08/2013 1524      Component Value Date/Time   CALCIUM 9.2 06/30/2014 1052   CALCIUM 9.5 11/08/2013 1524   ALKPHOS 101 06/30/2014 1052   ALKPHOS 118* 11/08/2013 1524   AST 15 06/30/2014 1052   AST 20 11/08/2013 1524   ALT 14 06/30/2014 1052   ALT 19 11/08/2013 1524   BILITOT 0.40 06/30/2014 1052   BILITOT 0.6 11/08/2013 1524       RADIOGRAPHIC STUDIES:  ASSESSMENT AND PLAN: This is a very pleasant 73 years old white male recently diagnosed with a stage IIIB non-small cell lung cancer, adenocarcinoma with large left lower lobe lung mass in addition to ipsilateral and contralateral mediastinal lymphadenopathy. He was not a candidate for concurrent radiotherapy because of the large  volume of treatment area.  He is currently undergoing systemic chemotherapy with carboplatin and Alimta status post 2 cycle and tolerated his treatment fairly well except for nausea. He mentions that Compazine is not helping. I will start the patient on Zofran 8 mg by mouth every 8 hours as needed for nausea. I recommended for the patient to continue his current treatment with carboplatin and Alimta as scheduled and he will receive cycle #3 today. For pain management he will continue on Percocet on as-needed basis. The patient would come back for followup visit in 3 weeks for evaluation after repeating CT scan of the chest for reevaluation of his disease. He was advised to call immediately if he has any concerning symptoms in the interval.  The patient voices understanding of current disease status and treatment options and is in agreement with the current care plan.  All questions were answered. The patient knows to call the clinic with any problems, questions or concerns. We can certainly see the patient much sooner if  necessary.  Disclaimer: This note was dictated with voice recognition software. Similar sounding words can inadvertently be transcribed and may be missed upon review.

## 2014-07-08 NOTE — Telephone Encounter (Signed)
Brief Outpatient Oncology Nutrition Note  Patient has been identified to be at risk on malnutrition screen.  Wt Readings from Last 10 Encounters:  07/08/14 219 lb (99.338 kg)  06/16/14 226 lb 1.6 oz (102.558 kg)  06/03/14 219 lb 1.6 oz (99.383 kg)  05/19/14 225 lb 14.4 oz (102.468 kg)  05/01/14 225 lb 4.8 oz (102.195 kg)  04/23/14 223 lb (101.152 kg)  04/08/14 223 lb (101.152 kg)  11/22/13 222 lb (100.699 kg)  11/08/13 229 lb (103.874 kg)  11/29/12 234 lb 6.4 oz (106.323 kg)    Dx:  Stage IIIB non-small cell lung cancer, adenocarcinoma dx June 2015.  Patient of Dr. Lorenza Chick patient due to weight loss.  Patient was unavailable.  Contact information for the [facility] provided.  Antonieta Iba, RD, LDN

## 2014-07-08 NOTE — Telephone Encounter (Signed)
Per staff message and POF I have scheduled appts. Advised scheduler of appts. JMW  

## 2014-07-08 NOTE — Patient Instructions (Signed)
[facility] Discharge Instructions for Patients Receiving Chemotherapy  Today you received the following chemotherapy agents: Alimta, Carboplatin.  You received your B12 injection and flu vaccination today as well.  To help prevent nausea and vomiting after your treatment, we encourage you to take your nausea medication as prescribed by your physician.    If you develop nausea and vomiting that is not controlled by your nausea medication, call the clinic.   BELOW ARE SYMPTOMS THAT SHOULD BE REPORTED IMMEDIATELY:  *FEVER GREATER THAN 100.5 F  *CHILLS WITH OR WITHOUT FEVER  NAUSEA AND VOMITING THAT IS NOT CONTROLLED WITH YOUR NAUSEA MEDICATION  *UNUSUAL SHORTNESS OF BREATH  *UNUSUAL BRUISING OR BLEEDING  TENDERNESS IN MOUTH AND THROAT WITH OR WITHOUT PRESENCE OF ULCERS  *URINARY PROBLEMS  *BOWEL PROBLEMS  UNUSUAL RASH Items with * indicate a potential emergency and should be followed up as soon as possible.  Feel free to call the clinic you have any questions or concerns. The clinic phone number is [phone].

## 2014-07-08 NOTE — Telephone Encounter (Signed)
Pt confirmed labs/ov per 09/08 POF, sent msg to add chemo, gave pt AVS ..Marland KitchenKJ

## 2014-07-14 ENCOUNTER — Other Ambulatory Visit (HOSPITAL_BASED_OUTPATIENT_CLINIC_OR_DEPARTMENT_OTHER): Payer: Medicare Other

## 2014-07-14 DIAGNOSIS — C3430 Malignant neoplasm of lower lobe, unspecified bronchus or lung: Secondary | ICD-10-CM

## 2014-07-14 DIAGNOSIS — C3490 Malignant neoplasm of unspecified part of unspecified bronchus or lung: Secondary | ICD-10-CM

## 2014-07-14 LAB — CBC WITH DIFFERENTIAL/PLATELET
BASO%: 0.7 % (ref 0.0–2.0)
BASOS ABS: 0 10*3/uL (ref 0.0–0.1)
EOS ABS: 0.1 10*3/uL (ref 0.0–0.5)
EOS%: 2.2 % (ref 0.0–7.0)
HEMATOCRIT: 32.3 % — AB (ref 38.4–49.9)
HEMOGLOBIN: 11 g/dL — AB (ref 13.0–17.1)
LYMPH%: 41.6 % (ref 14.0–49.0)
MCH: 31.9 pg (ref 27.2–33.4)
MCHC: 33.9 g/dL (ref 32.0–36.0)
MCV: 93.9 fL (ref 79.3–98.0)
MONO#: 0.1 10*3/uL (ref 0.1–0.9)
MONO%: 3.9 % (ref 0.0–14.0)
NEUT%: 51.6 % (ref 39.0–75.0)
NEUTROS ABS: 1.7 10*3/uL (ref 1.5–6.5)
PLATELETS: 183 10*3/uL (ref 140–400)
RBC: 3.44 10*6/uL — ABNORMAL LOW (ref 4.20–5.82)
RDW: 19.2 % — AB (ref 11.0–14.6)
WBC: 3.2 10*3/uL — ABNORMAL LOW (ref 4.0–10.3)
lymph#: 1.3 10*3/uL (ref 0.9–3.3)

## 2014-07-14 LAB — COMPREHENSIVE METABOLIC PANEL (CC13)
ALK PHOS: 91 U/L (ref 40–150)
ALT: 10 U/L (ref 0–55)
AST: 16 U/L (ref 5–34)
Albumin: 3.6 g/dL (ref 3.5–5.0)
Anion Gap: 9 mEq/L (ref 3–11)
BUN: 22 mg/dL (ref 7.0–26.0)
CALCIUM: 8.9 mg/dL (ref 8.4–10.4)
CO2: 25 mEq/L (ref 22–29)
CREATININE: 0.9 mg/dL (ref 0.7–1.3)
Chloride: 103 mEq/L (ref 98–109)
GLUCOSE: 128 mg/dL (ref 70–140)
Potassium: 4.1 mEq/L (ref 3.5–5.1)
Sodium: 136 mEq/L (ref 136–145)
Total Bilirubin: 0.39 mg/dL (ref 0.20–1.20)
Total Protein: 6.7 g/dL (ref 6.4–8.3)

## 2014-07-21 ENCOUNTER — Other Ambulatory Visit (HOSPITAL_BASED_OUTPATIENT_CLINIC_OR_DEPARTMENT_OTHER): Payer: Medicare Other

## 2014-07-21 DIAGNOSIS — C3430 Malignant neoplasm of lower lobe, unspecified bronchus or lung: Secondary | ICD-10-CM

## 2014-07-21 DIAGNOSIS — C3490 Malignant neoplasm of unspecified part of unspecified bronchus or lung: Secondary | ICD-10-CM

## 2014-07-21 LAB — COMPREHENSIVE METABOLIC PANEL (CC13)
ALBUMIN: 3.5 g/dL (ref 3.5–5.0)
ALT: 10 U/L (ref 0–55)
AST: 17 U/L (ref 5–34)
Alkaline Phosphatase: 87 U/L (ref 40–150)
Anion Gap: 9 mEq/L (ref 3–11)
BUN: 16 mg/dL (ref 7.0–26.0)
CALCIUM: 9.2 mg/dL (ref 8.4–10.4)
CO2: 28 mEq/L (ref 22–29)
Chloride: 103 mEq/L (ref 98–109)
Creatinine: 1 mg/dL (ref 0.7–1.3)
Glucose: 99 mg/dl (ref 70–140)
POTASSIUM: 4.6 meq/L (ref 3.5–5.1)
SODIUM: 140 meq/L (ref 136–145)
TOTAL PROTEIN: 6.6 g/dL (ref 6.4–8.3)
Total Bilirubin: 0.25 mg/dL (ref 0.20–1.20)

## 2014-07-21 LAB — CBC WITH DIFFERENTIAL/PLATELET
BASO%: 0.4 % (ref 0.0–2.0)
Basophils Absolute: 0 10*3/uL (ref 0.0–0.1)
EOS ABS: 0 10*3/uL (ref 0.0–0.5)
EOS%: 0.9 % (ref 0.0–7.0)
HCT: 30.3 % — ABNORMAL LOW (ref 38.4–49.9)
HEMOGLOBIN: 10.2 g/dL — AB (ref 13.0–17.1)
LYMPH%: 38.2 % (ref 14.0–49.0)
MCH: 32.3 pg (ref 27.2–33.4)
MCHC: 33.6 g/dL (ref 32.0–36.0)
MCV: 95.9 fL (ref 79.3–98.0)
MONO#: 0.6 10*3/uL (ref 0.1–0.9)
MONO%: 12.9 % (ref 0.0–14.0)
NEUT%: 47.6 % (ref 39.0–75.0)
NEUTROS ABS: 2.2 10*3/uL (ref 1.5–6.5)
PLATELETS: 96 10*3/uL — AB (ref 140–400)
RBC: 3.16 10*6/uL — AB (ref 4.20–5.82)
RDW: 19.3 % — ABNORMAL HIGH (ref 11.0–14.6)
WBC: 4.6 10*3/uL (ref 4.0–10.3)
lymph#: 1.7 10*3/uL (ref 0.9–3.3)

## 2014-07-22 ENCOUNTER — Telehealth: Payer: Self-pay | Admitting: Dietician

## 2014-07-22 NOTE — Telephone Encounter (Signed)
Brief Outpatient Oncology Nutrition Note  Patient has been identified to be at risk on malnutrition screen.  Wt Readings from Last 10 Encounters:  07/08/14 219 lb (99.338 kg)  06/16/14 226 lb 1.6 oz (102.558 kg)  06/03/14 219 lb 1.6 oz (99.383 kg)  05/19/14 225 lb 14.4 oz (102.468 kg)  05/01/14 225 lb 4.8 oz (102.195 kg)  04/23/14 223 lb (101.152 kg)  04/08/14 223 lb (101.152 kg)  11/22/13 222 lb (100.699 kg)  11/08/13 229 lb (103.874 kg)  11/29/12 234 lb 6.4 oz (106.323 kg)      Dx:  Stage IIIB non-small cell lung cancer, adenocarcinoma dx June 2015.  Patient of Dr. Julien Nordmann.  Called patient do to weight loss.  Stated that he was doing fine but not eating well secondary to a poor appetite.   Patient in charge of his own meals.  Discussed tips to increase calories and protein.  Has tried Ensure but got severe Heartburn after.  Discussed trying Medical illustrator.  Will mail patient tips to increase calories and protein, coupon for [employer], and contact information for the [facility].  Encouraged patient to call if no improvement.  Antonieta Iba, RD, LDN

## 2014-07-28 ENCOUNTER — Ambulatory Visit (HOSPITAL_BASED_OUTPATIENT_CLINIC_OR_DEPARTMENT_OTHER): Payer: Medicare Other

## 2014-07-28 ENCOUNTER — Ambulatory Visit (HOSPITAL_COMMUNITY): Payer: Medicare Other

## 2014-07-28 ENCOUNTER — Encounter: Payer: Self-pay | Admitting: Oncology

## 2014-07-28 ENCOUNTER — Encounter (HOSPITAL_COMMUNITY): Payer: Self-pay

## 2014-07-28 ENCOUNTER — Ambulatory Visit (HOSPITAL_BASED_OUTPATIENT_CLINIC_OR_DEPARTMENT_OTHER): Payer: Medicare Other | Admitting: Oncology

## 2014-07-28 ENCOUNTER — Ambulatory Visit (HOSPITAL_COMMUNITY)
Admission: RE | Admit: 2014-07-28 | Discharge: 2014-07-28 | Disposition: A | Discharge status: Home / Self Care | Payer: Medicare Other | Source: Ambulatory Visit | Attending: Internal Medicine | Admitting: Internal Medicine

## 2014-07-28 ENCOUNTER — Other Ambulatory Visit (HOSPITAL_BASED_OUTPATIENT_CLINIC_OR_DEPARTMENT_OTHER): Payer: Medicare Other

## 2014-07-28 VITALS — BP 116/59 | HR 63 | Temp 97.5°F | Resp 21 | Ht 74.0 in | Wt 219.6 lb

## 2014-07-28 DIAGNOSIS — J449 Chronic obstructive pulmonary disease, unspecified: Secondary | ICD-10-CM

## 2014-07-28 DIAGNOSIS — C3430 Malignant neoplasm of lower lobe, unspecified bronchus or lung: Secondary | ICD-10-CM

## 2014-07-28 DIAGNOSIS — C3402 Malignant neoplasm of left main bronchus: Secondary | ICD-10-CM

## 2014-07-28 DIAGNOSIS — Z5111 Encounter for antineoplastic chemotherapy: Secondary | ICD-10-CM

## 2014-07-28 DIAGNOSIS — R5381 Other malaise: Secondary | ICD-10-CM

## 2014-07-28 DIAGNOSIS — R5383 Other fatigue: Secondary | ICD-10-CM

## 2014-07-28 DIAGNOSIS — C3410 Malignant neoplasm of upper lobe, unspecified bronchus or lung: Secondary | ICD-10-CM | POA: Insufficient documentation

## 2014-07-28 DIAGNOSIS — Z23 Encounter for immunization: Secondary | ICD-10-CM

## 2014-07-28 DIAGNOSIS — C3490 Malignant neoplasm of unspecified part of unspecified bronchus or lung: Secondary | ICD-10-CM

## 2014-07-28 LAB — COMPREHENSIVE METABOLIC PANEL (CC13)
ALT: 13 U/L (ref 0–55)
AST: 18 U/L (ref 5–34)
Albumin: 3.7 g/dL (ref 3.5–5.0)
Alkaline Phosphatase: 91 U/L (ref 40–150)
Anion Gap: 9 mEq/L (ref 3–11)
BUN: 20.5 mg/dL (ref 7.0–26.0)
CHLORIDE: 105 meq/L (ref 98–109)
CO2: 25 meq/L (ref 22–29)
Calcium: 9.6 mg/dL (ref 8.4–10.4)
Creatinine: 1 mg/dL (ref 0.7–1.3)
Glucose: 120 mg/dl (ref 70–140)
POTASSIUM: 4.2 meq/L (ref 3.5–5.1)
SODIUM: 140 meq/L (ref 136–145)
TOTAL PROTEIN: 7.2 g/dL (ref 6.4–8.3)
Total Bilirubin: 0.28 mg/dL (ref 0.20–1.20)

## 2014-07-28 LAB — CBC WITH DIFFERENTIAL/PLATELET
BASO%: 0.7 % (ref 0.0–2.0)
Basophils Absolute: 0 10*3/uL (ref 0.0–0.1)
EOS%: 2.8 % (ref 0.0–7.0)
Eosinophils Absolute: 0.1 10*3/uL (ref 0.0–0.5)
HCT: 32.8 % — ABNORMAL LOW (ref 38.4–49.9)
HGB: 11.3 g/dL — ABNORMAL LOW (ref 13.0–17.1)
LYMPH#: 1.7 10*3/uL (ref 0.9–3.3)
LYMPH%: 34.9 % (ref 14.0–49.0)
MCH: 32.9 pg (ref 27.2–33.4)
MCHC: 34.4 g/dL (ref 32.0–36.0)
MCV: 95.8 fL (ref 79.3–98.0)
MONO#: 0.6 10*3/uL (ref 0.1–0.9)
MONO%: 12 % (ref 0.0–14.0)
NEUT#: 2.4 10*3/uL (ref 1.5–6.5)
NEUT%: 49.6 % (ref 39.0–75.0)
Platelets: 235 10*3/uL (ref 140–400)
RBC: 3.43 10*6/uL — AB (ref 4.20–5.82)
RDW: 20.6 % — AB (ref 11.0–14.6)
WBC: 4.9 10*3/uL (ref 4.0–10.3)

## 2014-07-28 MED ORDER — OXYCODONE-ACETAMINOPHEN 5-325 MG PO TABS: 1.0000 | ORAL_TABLET | ORAL | Status: DC | PRN

## 2014-07-28 MED ORDER — SODIUM CHLORIDE 0.9 % IV SOLN
Freq: Once | INTRAVENOUS | Status: AC
  Administered 2014-07-28: 13:00:00 via INTRAVENOUS

## 2014-07-28 MED ORDER — DEXAMETHASONE SODIUM PHOSPHATE 20 MG/5ML IJ SOLN
INTRAMUSCULAR | Status: AC
  Filled 2014-07-28: qty 5

## 2014-07-28 MED ORDER — ONDANSETRON 16 MG/50ML IVPB (CHCC)
16.0000 mg | Freq: Once | INTRAVENOUS | Status: AC
  Administered 2014-07-28: 16 mg via INTRAVENOUS

## 2014-07-28 MED ORDER — DEXAMETHASONE SODIUM PHOSPHATE 20 MG/5ML IJ SOLN
20.0000 mg | Freq: Once | INTRAMUSCULAR | Status: AC
  Administered 2014-07-28: 20 mg via INTRAVENOUS

## 2014-07-28 MED ORDER — IOHEXOL 300 MG/ML  SOLN
80.0000 mL | Freq: Once | INTRAMUSCULAR | Status: AC | PRN
  Administered 2014-07-28: 80 mL via INTRAVENOUS

## 2014-07-28 MED ORDER — ONDANSETRON HCL 8 MG PO TABS: 8.0000 mg | ORAL_TABLET | Freq: Three times a day (TID) | ORAL | Status: DC | PRN

## 2014-07-28 MED ORDER — PEMETREXED DISODIUM CHEMO INJECTION 500 MG
480.0000 mg/m2 | Freq: Once | INTRAVENOUS | Status: AC
  Administered 2014-07-28: 1100 mg via INTRAVENOUS
  Filled 2014-07-28: qty 44

## 2014-07-28 MED ORDER — PROCHLORPERAZINE MALEATE 10 MG PO TABS: 10.0000 mg | ORAL_TABLET | Freq: Four times a day (QID) | ORAL | Status: DC | PRN

## 2014-07-28 MED ORDER — ONDANSETRON 16 MG/50ML IVPB (CHCC)
INTRAVENOUS | Status: AC
  Filled 2014-07-28: qty 16

## 2014-07-28 MED ORDER — CARBOPLATIN CHEMO INJECTION 600 MG/60ML
600.0000 mg | Freq: Once | INTRAVENOUS | Status: AC
  Administered 2014-07-28: 600 mg via INTRAVENOUS
  Filled 2014-07-28: qty 60

## 2014-07-28 NOTE — Patient Instructions (Signed)
[facility] Discharge Instructions for Patients Receiving Chemotherapy  Today you received the following chemotherapy agents:alimta and carboplatin   To help prevent nausea and vomiting after your treatment, we encourage you to take your nausea medication as prescribed.    If you develop nausea and vomiting that is not controlled by your nausea medication, call the clinic.   BELOW ARE SYMPTOMS THAT SHOULD BE REPORTED IMMEDIATELY:  *FEVER GREATER THAN 100.5 F  *CHILLS WITH OR WITHOUT FEVER  NAUSEA AND VOMITING THAT IS NOT CONTROLLED WITH YOUR NAUSEA MEDICATION  *UNUSUAL SHORTNESS OF BREATH  *UNUSUAL BRUISING OR BLEEDING  TENDERNESS IN MOUTH AND THROAT WITH OR WITHOUT PRESENCE OF ULCERS  *URINARY PROBLEMS  *BOWEL PROBLEMS  UNUSUAL RASH Items with * indicate a potential emergency and should be followed up as soon as possible.  Feel free to call the clinic you have any questions or concerns. The clinic phone number is [phone].

## 2014-07-28 NOTE — Progress Notes (Signed)
[place] [phone]   Fax:(336) [place], MD [address] [place] [address]  DIAGNOSIS: Stage IIIB (T2b, N3, M0) non-small cell lung cancer, adenocarcinoma, diagnosed in June of 2015 with large left lower lobe lung mass in addition to ipsilateral and contralateral mediastinal lymphadenopathy.  PRIOR THERAPY: None  CURRENT THERAPY: Systemic chemotherapy with carboplatin for AUC of 5 and Alimta 500 mg/M2 every 3 weeks but status post 3 cycles.  INTERVAL HISTORY: Tyler [name] 73 y.o. male returns to the clinic today for followup visit accompanied by his sister. He tolerated the third cycle of his systemic chemotherapy with carboplatin and Alimta better with the addition of Zofran. He still has mild fatigue. He continues to have baseline shortness of breath and currently on home oxygen. He denied having any significant chest pain, cough or hemoptysis. He has no weight loss or night sweats. The patient denied having any significant vomiting, no fever or chills. He is here today to start cycle #4 of chemotherapy. The patient was scheduled for a CT of the chest prior to this cycle of chemo, but he states that he was not aware of the appointment and missed it.   MEDICAL HISTORY: Past Medical History  Diagnosis Date  . Allergic rhinitis   . BPH (benign prostatic hyperplasia)   . GERD (gastroesophageal reflux disease)   . COPD (chronic obstructive pulmonary disease)   . Shortness of breath   . Arthritis   . Enlarged prostate   . Hypertension     dr Margreta Journey little in [place]   pcp      dr Stanford Breed  cardiac md  . Mental disorder   . Constipation   . Cancer Lung  . 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 Chronic chest pain   . Pneumonia     hx    ALLERGIES:  is allergic to ketorolac tromethamine; codeine; morphine and related; percocet; tramadol; and tussionex pennkinetic er.  MEDICATIONS:    Current Outpatient Prescriptions  Medication Sig Dispense Refill  . albuterol (PROVENTIL) (2.5 MG/3ML) 0.083% nebulizer solution Take 3 mLs (2.5 mg total) by nebulization every 6 (six) hours as needed for wheezing.  75 mL  3  . dexamethasone (DECADRON) 4 MG tablet Take 1 tablet by mouth twice a day, the day before, the day of and the day after chemotherapy. Take with food  30 tablet  1  . flurazepam (DALMANE) 30 MG capsule Take 30 mg by mouth at bedtime as needed for sleep.      . folic acid (FOLVITE) 1 MG tablet Take 1 tablet (1 mg total) by mouth daily.  30 tablet  3  . naproxen (NAPROSYN) 500 MG tablet Take 500 mg by mouth 2 (two) times daily with a meal.      . nebivolol (BYSTOLIC) 10 MG tablet Take 10 mg by mouth 2 (two) times daily. 1 tab bid      . omeprazole (PRILOSEC) 20 MG capsule Take 20 mg by mouth 2 (two) times daily before a meal.       . ondansetron (ZOFRAN) 8 MG tablet Take 1 tablet (8 mg total) by mouth every 8 (eight) hours as needed for nausea or vomiting.  20 tablet  1  . oxyCODONE-acetaminophen (PERCOCET/ROXICET) 5-325 MG per tablet Take 1 tablet by mouth every 4 (four) hours as needed for severe pain.  30 tablet  0  .  prochlorperazine (COMPAZINE) 10 MG tablet Take 1 tablet (10 mg total) by mouth every 6 (six) hours as needed for nausea or vomiting.  30 tablet  1  . Tamsulosin HCl (FLOMAX) 0.4 MG CAPS Take 0.4 mg by mouth every morning.        No current facility-administered medications for this visit.    SURGICAL HISTORY:  Past Surgical History  Procedure Laterality Date  . Ankle surgery  2005  . Prostate surgery  11/25/2010  . Hernia repair    . Mediastinoscopy  06/11/2012    Procedure: MEDIASTINOSCOPY;  Surgeon: Grace Isaac, MD;  Location: [facility];  Service: Thoracic;  Laterality: N/A;  . Inguinal hernia repair      left  . Eye surgery      cataract right  . Video bronchoscopy with endobronchial ultrasound  10/08/2012    Procedure: VIDEO BRONCHOSCOPY WITH  ENDOBRONCHIAL ULTRASOUND;  Surgeon: Collene Gobble, MD;  Location: [facility];  Service: Pulmonary;  Laterality: N/A;  . Video bronchoscopy Bilateral 04/11/2014    Procedure: VIDEO BRONCHOSCOPY WITHOUT FLUORO;  Surgeon: Collene Gobble, MD;  Location: [facility];  Service: Cardiopulmonary;  Laterality: Bilateral;    REVIEW OF SYSTEMS:  Constitutional: positive for fatigue Eyes: negative Ears, nose, mouth, throat, and face: negative Respiratory: positive for dyspnea on exertion Cardiovascular: negative Gastrointestinal: negative Genitourinary:negative Integument/breast: negative Hematologic/lymphatic: negative Musculoskeletal:negative Neurological: negative Behavioral/Psych: negative Endocrine: negative Allergic/Immunologic: negative   PHYSICAL EXAMINATION: General appearance: alert, cooperative and no distress Head: Normocephalic, without obvious abnormality, atraumatic Neck: no adenopathy, no JVD, supple, symmetrical, trachea midline and thyroid not enlarged, symmetric, no tenderness/mass/nodules Lymph nodes: Cervical, supraclavicular, and axillary nodes normal. Resp: wheezes bilaterally Back: symmetric, no curvature. ROM normal. No CVA tenderness. Cardio: regular rate and rhythm, S1, S2 normal, no murmur, click, rub or gallop GI: soft, non-tender; bowel sounds normal; no masses,  no organomegaly Extremities: extremities normal, atraumatic, no cyanosis or edema Neurologic: Alert and oriented X 3, normal strength and tone. Normal symmetric reflexes. Normal coordination and gait  ECOG PERFORMANCE STATUS: 1 - Symptomatic but completely ambulatory  Blood pressure 116/59, pulse 63, temperature 97.5 F (36.4 C), temperature source Oral, resp. rate 21, height 6\' 2"  (1.88 m), weight 219 lb 9.6 oz (99.61 kg), SpO2 99.00%.  LABORATORY DATA: Lab Results  Component Value Date   WBC 4.9 07/28/2014   HGB 11.3* 07/28/2014   HCT 32.8* 07/28/2014   MCV 95.8 07/28/2014   PLT 235 07/28/2014       Chemistry      Component Value Date/Time   NA 140 07/28/2014 1052   NA 139 11/08/2013 1524   K 4.2 07/28/2014 1052   K 4.1 11/08/2013 1524   CL 101 11/08/2013 1524   CO2 25 07/28/2014 1052   CO2 30 11/08/2013 1524   BUN 20.5 07/28/2014 1052   BUN 13 11/08/2013 1524   CREATININE 1.0 07/28/2014 1052   CREATININE 0.8 11/08/2013 1524      Component Value Date/Time   CALCIUM 9.6 07/28/2014 1052   CALCIUM 9.5 11/08/2013 1524   ALKPHOS 91 07/28/2014 1052   ALKPHOS 118* 11/08/2013 1524   AST 18 07/28/2014 1052   AST 20 11/08/2013 1524   ALT 13 07/28/2014 1052   ALT 19 11/08/2013 1524   BILITOT 0.28 07/28/2014 1052   BILITOT 0.6 11/08/2013 1524       RADIOGRAPHIC STUDIES:  ASSESSMENT AND PLAN: This is a very pleasant 73 years old white male recently diagnosed with a stage IIIB  non-small cell lung cancer, adenocarcinoma with large left lower lobe lung mass in addition to ipsilateral and contralateral mediastinal lymphadenopathy. He was not a candidate for concurrent radiotherapy because of the large volume of treatment area.  He is currently undergoing systemic chemotherapy with carboplatin and Alimta status post 3 cycles and tolerated his treatment fairly well His nausea is better controlled with the addition of Zofran.  I recommended for the patient to continue his current treatment with carboplatin and Alimta as scheduled and he will receive cycle #4 today. For pain management he will continue on Percocet on as-needed basis. The patient would come back for followup visit in 3 weeks for evaluation. I have requested that we reschedule his CT scan within the next 1-2 days. He was instructed to call the office 1-2 days after the scan to get the results.  He was advised to call immediately if he has any concerning symptoms in the interval.  The patient voices understanding of current disease status and treatment options and is in agreement with the current care plan.  All questions were answered. The patient knows to  call the clinic with any problems, questions or concerns. We can certainly see the patient much sooner if necessary.  Case reviewed with Dr. Julien Nordmann.  Mikey Bussing, DNP, AGPCNP-BC    ADDENDUM: Hematology/Oncology Attending: I had a face to face encounter with the patient. I recommended his care plan. This is a very pleasant 73 years old white male with history of stage IIIB non-small cell lung cancer currently undergoing systemic chemotherapy with carboplatin and Alimta status post 3 cycles. He is rating his treatment fairly well with no significant adverse effects except for mild fatigue. His recent CT scan of the chest, abdomen and pelvis showed improvement in his disease. I recommended for the patient to continue his current treatment was carboplatin and Alimta as scheduled. He was started cycle #4 today. He was given a refill of his pain medications. The patient would come back for follow up visit in 3 weeks with the start of cycle #5. He was advised to call immediately if he has any concerning symptoms in the interval.  Disclaimer: This note was dictated with voice recognition software. Similar sounding words can inadvertently be transcribed and may be missed upon review. Eilleen Kempf., MD 07/30/2014

## 2014-07-29 ENCOUNTER — Telehealth: Payer: Self-pay | Admitting: Internal Medicine

## 2014-07-29 NOTE — Telephone Encounter (Signed)
lvm for pt regarding to OCT appts....advised pt to pick up new sched

## 2014-08-04 ENCOUNTER — Other Ambulatory Visit (HOSPITAL_BASED_OUTPATIENT_CLINIC_OR_DEPARTMENT_OTHER): Payer: Medicare Other

## 2014-08-04 DIAGNOSIS — C3432 Malignant neoplasm of lower lobe, left bronchus or lung: Secondary | ICD-10-CM

## 2014-08-04 DIAGNOSIS — Z23 Encounter for immunization: Secondary | ICD-10-CM

## 2014-08-04 DIAGNOSIS — C3410 Malignant neoplasm of upper lobe, unspecified bronchus or lung: Secondary | ICD-10-CM

## 2014-08-04 LAB — CBC WITH DIFFERENTIAL/PLATELET
BASO%: 0.9 % (ref 0.0–2.0)
Basophils Absolute: 0 10*3/uL (ref 0.0–0.1)
EOS%: 4.5 % (ref 0.0–7.0)
Eosinophils Absolute: 0.1 10*3/uL (ref 0.0–0.5)
HCT: 32.5 % — ABNORMAL LOW (ref 38.4–49.9)
HGB: 11 g/dL — ABNORMAL LOW (ref 13.0–17.1)
LYMPH%: 51.3 % — AB (ref 14.0–49.0)
MCH: 32.6 pg (ref 27.2–33.4)
MCHC: 33.9 g/dL (ref 32.0–36.0)
MCV: 96.2 fL (ref 79.3–98.0)
MONO#: 0.1 10*3/uL (ref 0.1–0.9)
MONO%: 5.9 % (ref 0.0–14.0)
NEUT#: 0.9 10*3/uL — ABNORMAL LOW (ref 1.5–6.5)
NEUT%: 37.4 % — ABNORMAL LOW (ref 39.0–75.0)
PLATELETS: 149 10*3/uL (ref 140–400)
RBC: 3.38 10*6/uL — AB (ref 4.20–5.82)
RDW: 19.2 % — ABNORMAL HIGH (ref 11.0–14.6)
WBC: 2.4 10*3/uL — ABNORMAL LOW (ref 4.0–10.3)
lymph#: 1.3 10*3/uL (ref 0.9–3.3)

## 2014-08-04 LAB — COMPREHENSIVE METABOLIC PANEL (CC13)
ALK PHOS: 100 U/L (ref 40–150)
ALT: 23 U/L (ref 0–55)
ANION GAP: 8 meq/L (ref 3–11)
AST: 21 U/L (ref 5–34)
Albumin: 3.7 g/dL (ref 3.5–5.0)
BILIRUBIN TOTAL: 0.52 mg/dL (ref 0.20–1.20)
BUN: 24.8 mg/dL (ref 7.0–26.0)
CO2: 27 meq/L (ref 22–29)
CREATININE: 1 mg/dL (ref 0.7–1.3)
Calcium: 9.5 mg/dL (ref 8.4–10.4)
Chloride: 101 mEq/L (ref 98–109)
Glucose: 108 mg/dl (ref 70–140)
Potassium: 4.2 mEq/L (ref 3.5–5.1)
Sodium: 136 mEq/L (ref 136–145)
Total Protein: 7 g/dL (ref 6.4–8.3)

## 2014-08-11 ENCOUNTER — Other Ambulatory Visit (HOSPITAL_BASED_OUTPATIENT_CLINIC_OR_DEPARTMENT_OTHER): Payer: Medicare Other

## 2014-08-11 DIAGNOSIS — C3490 Malignant neoplasm of unspecified part of unspecified bronchus or lung: Secondary | ICD-10-CM

## 2014-08-11 DIAGNOSIS — C3432 Malignant neoplasm of lower lobe, left bronchus or lung: Secondary | ICD-10-CM

## 2014-08-11 LAB — COMPREHENSIVE METABOLIC PANEL (CC13)
ALT: 18 U/L (ref 0–55)
ANION GAP: 9 meq/L (ref 3–11)
AST: 19 U/L (ref 5–34)
Albumin: 3.9 g/dL (ref 3.5–5.0)
Alkaline Phosphatase: 99 U/L (ref 40–150)
BUN: 21.2 mg/dL (ref 7.0–26.0)
CALCIUM: 9.7 mg/dL (ref 8.4–10.4)
CHLORIDE: 106 meq/L (ref 98–109)
CO2: 25 meq/L (ref 22–29)
CREATININE: 0.9 mg/dL (ref 0.7–1.3)
Glucose: 94 mg/dl (ref 70–140)
Potassium: 4.2 mEq/L (ref 3.5–5.1)
Sodium: 140 mEq/L (ref 136–145)
Total Bilirubin: 0.24 mg/dL (ref 0.20–1.20)
Total Protein: 7.1 g/dL (ref 6.4–8.3)

## 2014-08-11 LAB — CBC WITH DIFFERENTIAL/PLATELET
BASO%: 0.4 % (ref 0.0–2.0)
Basophils Absolute: 0 10*3/uL (ref 0.0–0.1)
EOS%: 1.4 % (ref 0.0–7.0)
Eosinophils Absolute: 0.1 10*3/uL (ref 0.0–0.5)
HCT: 29.7 % — ABNORMAL LOW (ref 38.4–49.9)
HEMOGLOBIN: 10.1 g/dL — AB (ref 13.0–17.1)
LYMPH#: 1.8 10*3/uL (ref 0.9–3.3)
LYMPH%: 40.3 % (ref 14.0–49.0)
MCH: 33 pg (ref 27.2–33.4)
MCHC: 33.9 g/dL (ref 32.0–36.0)
MCV: 97.3 fL (ref 79.3–98.0)
MONO#: 0.6 10*3/uL (ref 0.1–0.9)
MONO%: 13.2 % (ref 0.0–14.0)
NEUT#: 2 10*3/uL (ref 1.5–6.5)
NEUT%: 44.7 % (ref 39.0–75.0)
PLATELETS: 69 10*3/uL — AB (ref 140–400)
RBC: 3.05 10*6/uL — ABNORMAL LOW (ref 4.20–5.82)
RDW: 19 % — ABNORMAL HIGH (ref 11.0–14.6)
WBC: 4.4 10*3/uL (ref 4.0–10.3)

## 2014-08-18 ENCOUNTER — Ambulatory Visit (HOSPITAL_BASED_OUTPATIENT_CLINIC_OR_DEPARTMENT_OTHER): Payer: Medicare Other

## 2014-08-18 ENCOUNTER — Telehealth: Payer: Self-pay | Admitting: Internal Medicine

## 2014-08-18 ENCOUNTER — Encounter: Payer: Self-pay | Admitting: Physician Assistant

## 2014-08-18 ENCOUNTER — Other Ambulatory Visit (HOSPITAL_BASED_OUTPATIENT_CLINIC_OR_DEPARTMENT_OTHER): Payer: Medicare Other

## 2014-08-18 ENCOUNTER — Ambulatory Visit (HOSPITAL_BASED_OUTPATIENT_CLINIC_OR_DEPARTMENT_OTHER): Payer: Medicare Other | Admitting: Physician Assistant

## 2014-08-18 VITALS — BP 110/58 | HR 62 | Temp 97.6°F | Resp 17 | Ht 74.0 in | Wt 223.7 lb

## 2014-08-18 DIAGNOSIS — C3432 Malignant neoplasm of lower lobe, left bronchus or lung: Secondary | ICD-10-CM

## 2014-08-18 DIAGNOSIS — Z5111 Encounter for antineoplastic chemotherapy: Secondary | ICD-10-CM

## 2014-08-18 DIAGNOSIS — C3490 Malignant neoplasm of unspecified part of unspecified bronchus or lung: Secondary | ICD-10-CM

## 2014-08-18 DIAGNOSIS — C3410 Malignant neoplasm of upper lobe, unspecified bronchus or lung: Secondary | ICD-10-CM

## 2014-08-18 DIAGNOSIS — C3482 Malignant neoplasm of overlapping sites of left bronchus and lung: Secondary | ICD-10-CM

## 2014-08-18 LAB — COMPREHENSIVE METABOLIC PANEL (CC13)
ALT: 13 U/L (ref 0–55)
ANION GAP: 9 meq/L (ref 3–11)
AST: 18 U/L (ref 5–34)
Albumin: 3.9 g/dL (ref 3.5–5.0)
Alkaline Phosphatase: 89 U/L (ref 40–150)
BUN: 17.7 mg/dL (ref 7.0–26.0)
CALCIUM: 9.5 mg/dL (ref 8.4–10.4)
CHLORIDE: 107 meq/L (ref 98–109)
CO2: 24 meq/L (ref 22–29)
CREATININE: 1 mg/dL (ref 0.7–1.3)
GLUCOSE: 119 mg/dL (ref 70–140)
Potassium: 4.4 mEq/L (ref 3.5–5.1)
Sodium: 140 mEq/L (ref 136–145)
Total Bilirubin: 0.26 mg/dL (ref 0.20–1.20)
Total Protein: 6.7 g/dL (ref 6.4–8.3)

## 2014-08-18 LAB — CBC WITH DIFFERENTIAL/PLATELET
BASO%: 0.5 % (ref 0.0–2.0)
BASOS ABS: 0 10*3/uL (ref 0.0–0.1)
EOS ABS: 0.2 10*3/uL (ref 0.0–0.5)
EOS%: 4.1 % (ref 0.0–7.0)
HCT: 28.9 % — ABNORMAL LOW (ref 38.4–49.9)
HEMOGLOBIN: 9.9 g/dL — AB (ref 13.0–17.1)
LYMPH#: 1 10*3/uL (ref 0.9–3.3)
LYMPH%: 26.5 % (ref 14.0–49.0)
MCH: 33.2 pg (ref 27.2–33.4)
MCHC: 34.3 g/dL (ref 32.0–36.0)
MCV: 97 fL (ref 79.3–98.0)
MONO#: 0.3 10*3/uL (ref 0.1–0.9)
MONO%: 7.1 % (ref 0.0–14.0)
NEUT%: 61.8 % (ref 39.0–75.0)
NEUTROS ABS: 2.4 10*3/uL (ref 1.5–6.5)
Platelets: 126 10*3/uL — ABNORMAL LOW (ref 140–400)
RBC: 2.98 10*6/uL — ABNORMAL LOW (ref 4.20–5.82)
RDW: 18.2 % — AB (ref 11.0–14.6)
WBC: 3.9 10*3/uL — ABNORMAL LOW (ref 4.0–10.3)

## 2014-08-18 MED ORDER — DEXAMETHASONE SODIUM PHOSPHATE 20 MG/5ML IJ SOLN
INTRAMUSCULAR | Status: AC
  Filled 2014-08-18: qty 5

## 2014-08-18 MED ORDER — DEXAMETHASONE SODIUM PHOSPHATE 20 MG/5ML IJ SOLN
20.0000 mg | Freq: Once | INTRAMUSCULAR | Status: AC
  Administered 2014-08-18: 20 mg via INTRAVENOUS

## 2014-08-18 MED ORDER — SODIUM CHLORIDE 0.9 % IV SOLN
Freq: Once | INTRAVENOUS | Status: AC
  Administered 2014-08-18: 14:00:00 via INTRAVENOUS

## 2014-08-18 MED ORDER — SODIUM CHLORIDE 0.9 % IV SOLN
595.5000 mg | Freq: Once | INTRAVENOUS | Status: AC
  Administered 2014-08-18: 600 mg via INTRAVENOUS
  Filled 2014-08-18: qty 60

## 2014-08-18 MED ORDER — ONDANSETRON 16 MG/50ML IVPB (CHCC)
16.0000 mg | Freq: Once | INTRAVENOUS | Status: AC
  Administered 2014-08-18: 16 mg via INTRAVENOUS

## 2014-08-18 MED ORDER — SODIUM CHLORIDE 0.9 % IV SOLN
480.0000 mg/m2 | Freq: Once | INTRAVENOUS | Status: AC
  Administered 2014-08-18: 1100 mg via INTRAVENOUS
  Filled 2014-08-18: qty 44

## 2014-08-18 MED ORDER — ONDANSETRON 16 MG/50ML IVPB (CHCC)
INTRAVENOUS | Status: AC
  Filled 2014-08-18: qty 16

## 2014-08-18 NOTE — Patient Instructions (Signed)
Continue with labs and chemotherapy as scheduled Followup in 3 weeks

## 2014-08-18 NOTE — Telephone Encounter (Signed)
gv and printed appt sched and avs for pt for OCT and NOV....sed added tx. °

## 2014-08-18 NOTE — Patient Instructions (Signed)
[facility] Discharge Instructions for Patients Receiving Chemotherapy  Today you received the following chemotherapy agents: Alimta, Carboplatin.  You received your B12 injection and flu vaccination today as well.  To help prevent nausea and vomiting after your treatment, we encourage you to take your nausea medication as prescribed by your physician.    If you develop nausea and vomiting that is not controlled by your nausea medication, call the clinic.   BELOW ARE SYMPTOMS THAT SHOULD BE REPORTED IMMEDIATELY:  *FEVER GREATER THAN 100.5 F  *CHILLS WITH OR WITHOUT FEVER  NAUSEA AND VOMITING THAT IS NOT CONTROLLED WITH YOUR NAUSEA MEDICATION  *UNUSUAL SHORTNESS OF BREATH  *UNUSUAL BRUISING OR BLEEDING  TENDERNESS IN MOUTH AND THROAT WITH OR WITHOUT PRESENCE OF ULCERS  *URINARY PROBLEMS  *BOWEL PROBLEMS  UNUSUAL RASH Items with * indicate a potential emergency and should be followed up as soon as possible.  Feel free to call the clinic you have any questions or concerns. The clinic phone number is [phone].

## 2014-08-18 NOTE — Progress Notes (Addendum)
[place] [phone]   Fax:(336) [place], [place] [address] [place]  [phone]  DIAGNOSIS: Stage IIIB (T2b, N3, M0) non-small cell lung cancer, adenocarcinoma, diagnosed in June of 2015 with large left lower lobe lung mass in addition to ipsilateral and contralateral mediastinal lymphadenopathy.  PRIOR THERAPY: None  CURRENT THERAPY: Systemic chemotherapy with carboplatin for AUC of 5 and Alimta 500 mg/M2 every 3 weeks but status post 4 cycles.  INTERVAL HISTORY: Tyler [name] 73 y.o. male returns to the clinic today for followup visit accompanied by his sister. Overall is tolerating his systemic chemotherapy with carboplatin and Alimta relatively well particularly with the addition of Zofran. He complains of some fatigue. He also notes some shortness of breath which he feels might be slightly increased. He states that he seems to "run out of air". He is currently on oxygen at 2 L per minute. He does have some issues with constipation that are well managed with MiraLAX. He continues to have issues with sleeping but states that this is a long-standing issue. He reports only one episode of nausea and vomiting after last cycle of chemotherapy.  He denied having any significant chest pain, cough or hemoptysis. He has no weight loss or night sweats. The patient denied having any significant vomiting, no fever or chills. He is here today to start cycle #5 of chemotherapy. He did have a restaging CT scan of his chest with contrast on 07/28/2014. He presents to discuss the results.  MEDICAL HISTORY: Past Medical History  Diagnosis Date  . Allergic rhinitis   . BPH (benign prostatic hyperplasia)   . GERD (gastroesophageal reflux disease)   . COPD (chronic obstructive pulmonary disease)   . Shortness of breath   . Arthritis   . Enlarged prostate   . Hypertension     dr Margreta Journey little in [place]   pcp      dr Stanford Breed  cardiac  md  . Mental disorder   . Constipation   . Cancer Lung  . 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 Chronic chest pain   . Pneumonia     hx    ALLERGIES:  is allergic to ketorolac tromethamine; codeine; morphine and related; percocet; tramadol; and tussionex pennkinetic er.  MEDICATIONS:  Current Outpatient Prescriptions  Medication Sig Dispense Refill  . albuterol (PROVENTIL) (2.5 MG/3ML) 0.083% nebulizer solution Take 3 mLs (2.5 mg total) by nebulization every 6 (six) hours as needed for wheezing.  75 mL  3  . dexamethasone (DECADRON) 4 MG tablet Take 1 tablet by mouth twice a day, the day before, the day of and the day after chemotherapy. Take with food  30 tablet  1  . flurazepam (DALMANE) 30 MG capsule Take 30 mg by mouth at bedtime as needed for sleep.      . folic acid (FOLVITE) 1 MG tablet Take 1 tablet (1 mg total) by mouth daily.  30 tablet  3  . naproxen (NAPROSYN) 500 MG tablet Take 500 mg by mouth 2 (two) times daily with a meal.      . nebivolol (BYSTOLIC) 10 MG tablet Take 10 mg by mouth 2 (two) times daily. 1 tab bid      . omeprazole (PRILOSEC) 20 MG capsule Take 20 mg by mouth 2 (two) times daily before a meal.       . ondansetron (ZOFRAN) 8 MG tablet Take  1 tablet (8 mg total) by mouth every 8 (eight) hours as needed for nausea or vomiting.  20 tablet  1  . oxyCODONE-acetaminophen (PERCOCET/ROXICET) 5-325 MG per tablet Take 1 tablet by mouth every 4 (four) hours as needed for severe pain.  30 tablet  0  . prochlorperazine (COMPAZINE) 10 MG tablet Take 1 tablet (10 mg total) by mouth every 6 (six) hours as needed for nausea or vomiting.  30 tablet  1  . Tamsulosin HCl (FLOMAX) 0.4 MG CAPS Take 0.4 mg by mouth every morning.        No current facility-administered medications for this visit.    SURGICAL HISTORY:  Past Surgical History  Procedure Laterality Date  . Ankle surgery  2005  . Prostate surgery  11/25/2010  . Hernia repair    .  Mediastinoscopy  06/11/2012    Procedure: MEDIASTINOSCOPY;  Surgeon: Grace Isaac, MD;  Location: [facility];  Service: Thoracic;  Laterality: N/A;  . Inguinal hernia repair      left  . Eye surgery      cataract right  . Video bronchoscopy with endobronchial ultrasound  10/08/2012    Procedure: VIDEO BRONCHOSCOPY WITH ENDOBRONCHIAL ULTRASOUND;  Surgeon: Collene Gobble, MD;  Location: [facility];  Service: Pulmonary;  Laterality: N/A;  . Video bronchoscopy Bilateral 04/11/2014    Procedure: VIDEO BRONCHOSCOPY WITHOUT FLUORO;  Surgeon: Collene Gobble, MD;  Location: [facility];  Service: Cardiopulmonary;  Laterality: Bilateral;    REVIEW OF SYSTEMS:  Constitutional: positive for fatigue Eyes: negative Ears, nose, mouth, throat, and face: negative Respiratory: positive for dyspnea on exertion Cardiovascular: negative Gastrointestinal: negative Genitourinary:negative Integument/breast: negative Hematologic/lymphatic: negative Musculoskeletal:negative Neurological: negative Behavioral/Psych: negative Endocrine: negative Allergic/Immunologic: negative   PHYSICAL EXAMINATION: General appearance: alert, cooperative and no distress Head: Normocephalic, without obvious abnormality, atraumatic Neck: no adenopathy, no JVD, supple, symmetrical, trachea midline and thyroid not enlarged, symmetric, no tenderness/mass/nodules Lymph nodes: Cervical, supraclavicular, and axillary nodes normal. Resp: wheezes bilaterally Back: symmetric, no curvature. ROM normal. No CVA tenderness. Cardio: regular rate and rhythm, S1, S2 normal, no murmur, click, rub or gallop GI: soft, non-tender; bowel sounds normal; no masses,  no organomegaly Extremities: extremities normal, atraumatic, no cyanosis or edema Neurologic: Alert and oriented X 3, normal strength and tone. Normal symmetric reflexes. Normal coordination and gait  ECOG PERFORMANCE STATUS: 1 - Symptomatic but completely ambulatory  Blood pressure 110/58,  pulse 62, temperature 97.6 F (36.4 C), temperature source Oral, resp. rate 17, height 6\' 2"  (1.88 m), weight 223 lb 11.2 oz (101.47 kg), SpO2 96.00%.  LABORATORY DATA: Lab Results  Component Value Date   WBC 3.9* 08/18/2014   HGB 9.9* 08/18/2014   HCT 28.9* 08/18/2014   MCV 97.0 08/18/2014   PLT 126* 08/18/2014      Chemistry      Component Value Date/Time   NA 140 08/18/2014 1206   NA 139 11/08/2013 1524   K 4.4 08/18/2014 1206   K 4.1 11/08/2013 1524   CL 101 11/08/2013 1524   CO2 24 08/18/2014 1206   CO2 30 11/08/2013 1524   BUN 17.7 08/18/2014 1206   BUN 13 11/08/2013 1524   CREATININE 1.0 08/18/2014 1206   CREATININE 0.8 11/08/2013 1524      Component Value Date/Time   CALCIUM 9.5 08/18/2014 1206   CALCIUM 9.5 11/08/2013 1524   ALKPHOS 89 08/18/2014 1206   ALKPHOS 118* 11/08/2013 1524   AST 18 08/18/2014 1206   AST 20 11/08/2013 1524  ALT 13 08/18/2014 1206   ALT 19 11/08/2013 1524   BILITOT 0.26 08/18/2014 1206   BILITOT 0.6 11/08/2013 1524       RADIOGRAPHIC STUDIES: Ct Chest W Contrast  07/28/2014   CLINICAL DATA:  Followup lung cancer  EXAM: CT CHEST WITH CONTRAST  TECHNIQUE: Multidetector CT imaging of the chest was performed during intravenous contrast administration.  CONTRAST:  5mL OMNIPAQUE IOHEXOL 300 MG/ML  SOLN  COMPARISON:  03/25/2014 and PET-CT from 04/24/2014  FINDINGS: Mediastinum: The heart size is normal. No pericardial effusion. Calcified atherosclerotic disease involves the thoracic aorta as well as the LAD coronary artery. Trachea appears patent and is midline. There is a large hiatal hernia. AP window lymph node measures 1.1 cm, image 23/series 2. Previously 1.6 cm. Right paratracheal lymph node measures 0.8 cm, image 16/ series 2. Previously 0.7 cm 6 mm right paratracheal lymph node is noted, image 22/ series 2. Previously 0.8 cm  Lungs/Pleura: Moderate changes of centrilobular emphysema noted. Pleural thickening and loculated pleural overlies the posterior left  midlung. Changes of external beam radiation are identified within the superior segment of the left lower lobe. Cavitary lesion within the superior segment of left lower lobe measures 1.8 x 1.6 cm. Previously this measured 3.7 x 4.2 cm.  Upper Abdomen: The visualized portions of the liver appear normal. The adrenal glands are both normal. The visualized portions of the spleen and pancreas are normal.  Musculoskeletal: Review of the visualized osseous structures is negative for aggressive or lytic or sclerotic bone lesion.  IMPRESSION: 1. Interval response to therapy. The cavitary lesion within the left lung has decreased in size from previous exam. 2. Interval decrease in size of AP window adenopathy. 3. Atherosclerotic disease 4. Emphysema.   Electronically Signed   By: Kerby Moors M.D.   On: 07/28/2014 16:20   ASSESSMENT AND PLAN: This is a very pleasant 73 years old white male recently diagnosed with a stage IIIB non-small cell lung cancer, adenocarcinoma with large left lower lobe lung mass in addition to ipsilateral and contralateral mediastinal lymphadenopathy. He was not a candidate for concurrent radiotherapy because of the large volume of treatment area.  He is currently undergoing systemic chemotherapy with carboplatin and Alimta status post 4 cycles and tolerated his treatment fairly well.  His nausea is better controlled with the addition of Zofran.  Patient was discussed with him also seen by Dr. Julien Nordmann. His CT scan revealed interval response to therapy. The cavitary lesion within the left lung is decreased in size from the previous exam. Results so interval decrease in the size of AP window adenopathy. These results were discussed with the patient and his family member. He will proceed to cycle #5 today as scheduled. He'll continue with weekly labs as scheduled. We'll return in 3 weeks for another symptom management visit prior to cycle #6. Pending the outcome of his next restaging CT scan,  the patient would be interested in a break chemotherapy.   He was advised to call immediately if he has any concerning symptoms in the interval.  The patient voices understanding of current disease status and treatment options and is in agreement with the current care plan.  All questions were answered. The patient knows to call the clinic with any problems, questions or concerns. We can certainly see the patient much sooner if necessary.    Disclaimer: This note was dictated with voice recognition software. Similar sounding words can inadvertently be transcribed and may be missed upon review. Wynetta Emery,  Dakwon Wenberg E, PA-C 08/18/2014  ADDENDUM: Hematology/Oncology Attending: I had a face to face encounter with the patient today. I recommended his care plan. This is a very pleasant 73 years old white male with a stage IIIB non-small cell lung cancer currently undergoing systemic chemotherapy with carboplatin and Alimta status post 4 cycles and tolerating his treatment fairly well. Repeat CT scan of the chest after cycle #3 showed improvement in his disease. I discussed the scan results with the patient and his wife today. I recommended for him to continue his current treatment was carboplatin and Alimta for 2 more cycles. He'll start cycle #5 today. The patient would come back for followup visit in 3 weeks with the start of the next cycle of his treatment. He was advised to call immediately if he has any concerning symptoms in the interval.  Disclaimer: This note was dictated with voice recognition software. Similar sounding words can inadvertently be transcribed and may be missed upon review. Eilleen Kempf., MD 08/18/2014

## 2014-08-25 ENCOUNTER — Other Ambulatory Visit (HOSPITAL_BASED_OUTPATIENT_CLINIC_OR_DEPARTMENT_OTHER): Payer: Medicare Other

## 2014-08-25 ENCOUNTER — Telehealth: Payer: Self-pay | Admitting: Medical Oncology

## 2014-08-25 DIAGNOSIS — C3432 Malignant neoplasm of lower lobe, left bronchus or lung: Secondary | ICD-10-CM

## 2014-08-25 DIAGNOSIS — C3490 Malignant neoplasm of unspecified part of unspecified bronchus or lung: Secondary | ICD-10-CM

## 2014-08-25 LAB — CBC WITH DIFFERENTIAL/PLATELET
BASO%: 1.3 % (ref 0.0–2.0)
Basophils Absolute: 0 10*3/uL (ref 0.0–0.1)
EOS ABS: 0.1 10*3/uL (ref 0.0–0.5)
EOS%: 2.8 % (ref 0.0–7.0)
HCT: 28.4 % — ABNORMAL LOW (ref 38.4–49.9)
HGB: 9.7 g/dL — ABNORMAL LOW (ref 13.0–17.1)
LYMPH#: 1.5 10*3/uL (ref 0.9–3.3)
LYMPH%: 68.1 % — ABNORMAL HIGH (ref 14.0–49.0)
MCH: 34 pg — ABNORMAL HIGH (ref 27.2–33.4)
MCHC: 34.1 g/dL (ref 32.0–36.0)
MCV: 99.8 fL — ABNORMAL HIGH (ref 79.3–98.0)
MONO#: 0.1 10*3/uL (ref 0.1–0.9)
MONO%: 6.8 % (ref 0.0–14.0)
NEUT%: 21 % — ABNORMAL LOW (ref 39.0–75.0)
NEUTROS ABS: 0.5 10*3/uL — AB (ref 1.5–6.5)
Platelets: 120 10*3/uL — ABNORMAL LOW (ref 140–400)
RBC: 2.85 10*6/uL — ABNORMAL LOW (ref 4.20–5.82)
RDW: 19.3 % — AB (ref 11.0–14.6)
WBC: 2.2 10*3/uL — ABNORMAL LOW (ref 4.0–10.3)

## 2014-08-25 LAB — COMPREHENSIVE METABOLIC PANEL (CC13)
ALBUMIN: 3.7 g/dL (ref 3.5–5.0)
ALK PHOS: 94 U/L (ref 40–150)
ALT: 19 U/L (ref 0–55)
AST: 22 U/L (ref 5–34)
Anion Gap: 8 mEq/L (ref 3–11)
BUN: 23.5 mg/dL (ref 7.0–26.0)
CHLORIDE: 103 meq/L (ref 98–109)
CO2: 26 mEq/L (ref 22–29)
Calcium: 9.2 mg/dL (ref 8.4–10.4)
Creatinine: 1 mg/dL (ref 0.7–1.3)
Glucose: 118 mg/dl (ref 70–140)
POTASSIUM: 4.1 meq/L (ref 3.5–5.1)
Sodium: 137 mEq/L (ref 136–145)
Total Bilirubin: 0.5 mg/dL (ref 0.20–1.20)
Total Protein: 6.8 g/dL (ref 6.4–8.3)

## 2014-08-25 NOTE — Telephone Encounter (Signed)
I reviewed neutropenic precaution with pt and when to call MD for temp >100.5 f . HE stated he does not have a fever , but acknowledges he has been sweating. He voices understanding. He reports he is more short of breath-he is on oxygen. Appt made with lab and Cyndee B for am.  I offered appt today and he said he can''t come today . I instructed him to report to nearest ED for worsening symptoms.

## 2014-08-26 ENCOUNTER — Encounter (HOSPITAL_COMMUNITY): Payer: Self-pay | Admitting: Emergency Medicine

## 2014-08-26 ENCOUNTER — Other Ambulatory Visit: Payer: Self-pay | Admitting: Hematology and Oncology

## 2014-08-26 ENCOUNTER — Other Ambulatory Visit: Payer: Self-pay

## 2014-08-26 ENCOUNTER — Emergency Department (HOSPITAL_COMMUNITY): Payer: Medicare Other

## 2014-08-26 ENCOUNTER — Other Ambulatory Visit: Payer: Medicare Other

## 2014-08-26 ENCOUNTER — Ambulatory Visit (HOSPITAL_BASED_OUTPATIENT_CLINIC_OR_DEPARTMENT_OTHER): Payer: Medicare Other | Admitting: Nurse Practitioner

## 2014-08-26 ENCOUNTER — Emergency Department (HOSPITAL_COMMUNITY)
Admission: EM | Admit: 2014-08-26 | Discharge: 2014-08-26 | Disposition: A | Discharge status: Home / Self Care | Payer: Medicare Other | Attending: Emergency Medicine | Admitting: Emergency Medicine

## 2014-08-26 VITALS — BP 118/63 | HR 67 | Temp 97.4°F | Resp 18 | Ht 74.0 in | Wt 220.4 lb

## 2014-08-26 DIAGNOSIS — R079 Chest pain, unspecified: Secondary | ICD-10-CM

## 2014-08-26 DIAGNOSIS — C3432 Malignant neoplasm of lower lobe, left bronchus or lung: Secondary | ICD-10-CM

## 2014-08-26 DIAGNOSIS — Z8659 Personal history of other mental and behavioral disorders: Secondary | ICD-10-CM | POA: Insufficient documentation

## 2014-08-26 DIAGNOSIS — Z87891 Personal history of nicotine dependence: Secondary | ICD-10-CM | POA: Insufficient documentation

## 2014-08-26 DIAGNOSIS — Z791 Long term (current) use of non-steroidal anti-inflammatories (NSAID): Secondary | ICD-10-CM | POA: Diagnosis not present

## 2014-08-26 DIAGNOSIS — K219 Gastro-esophageal reflux disease without esophagitis: Secondary | ICD-10-CM | POA: Insufficient documentation

## 2014-08-26 DIAGNOSIS — C7800 Secondary malignant neoplasm of unspecified lung: Secondary | ICD-10-CM | POA: Insufficient documentation

## 2014-08-26 DIAGNOSIS — D61818 Other pancytopenia: Secondary | ICD-10-CM | POA: Diagnosis not present

## 2014-08-26 DIAGNOSIS — G893 Neoplasm related pain (acute) (chronic): Secondary | ICD-10-CM

## 2014-08-26 DIAGNOSIS — Z79899 Other long term (current) drug therapy: Secondary | ICD-10-CM | POA: Diagnosis not present

## 2014-08-26 DIAGNOSIS — Z8701 Personal history of pneumonia (recurrent): Secondary | ICD-10-CM | POA: Insufficient documentation

## 2014-08-26 DIAGNOSIS — N400 Enlarged prostate without lower urinary tract symptoms: Secondary | ICD-10-CM | POA: Diagnosis not present

## 2014-08-26 DIAGNOSIS — R530 Neoplastic (malignant) related fatigue: Secondary | ICD-10-CM

## 2014-08-26 DIAGNOSIS — M1990 Unspecified osteoarthritis, unspecified site: Secondary | ICD-10-CM | POA: Insufficient documentation

## 2014-08-26 DIAGNOSIS — R630 Anorexia: Secondary | ICD-10-CM | POA: Insufficient documentation

## 2014-08-26 DIAGNOSIS — K5900 Constipation, unspecified: Secondary | ICD-10-CM | POA: Insufficient documentation

## 2014-08-26 DIAGNOSIS — R5383 Other fatigue: Secondary | ICD-10-CM

## 2014-08-26 DIAGNOSIS — D649 Anemia, unspecified: Secondary | ICD-10-CM | POA: Insufficient documentation

## 2014-08-26 DIAGNOSIS — I10 Essential (primary) hypertension: Secondary | ICD-10-CM | POA: Diagnosis not present

## 2014-08-26 DIAGNOSIS — J449 Chronic obstructive pulmonary disease, unspecified: Secondary | ICD-10-CM | POA: Insufficient documentation

## 2014-08-26 DIAGNOSIS — C3402 Malignant neoplasm of left main bronchus: Secondary | ICD-10-CM

## 2014-08-26 DIAGNOSIS — R110 Nausea: Secondary | ICD-10-CM | POA: Insufficient documentation

## 2014-08-26 DIAGNOSIS — R634 Abnormal weight loss: Secondary | ICD-10-CM | POA: Insufficient documentation

## 2014-08-26 LAB — URINALYSIS, ROUTINE W REFLEX MICROSCOPIC
BILIRUBIN URINE: NEGATIVE
Glucose, UA: NEGATIVE mg/dL
HGB URINE DIPSTICK: NEGATIVE
Ketones, ur: NEGATIVE mg/dL
Leukocytes, UA: NEGATIVE
Nitrite: NEGATIVE
PROTEIN: NEGATIVE mg/dL
Specific Gravity, Urine: 1.023 (ref 1.005–1.030)
UROBILINOGEN UA: 1 mg/dL (ref 0.0–1.0)
pH: 6 (ref 5.0–8.0)

## 2014-08-26 LAB — CBC WITH DIFFERENTIAL/PLATELET
BASOS ABS: 0 10*3/uL (ref 0.0–0.1)
Basophils Relative: 0 % (ref 0–1)
Eosinophils Absolute: 0.1 10*3/uL (ref 0.0–0.7)
Eosinophils Relative: 3 % (ref 0–5)
HCT: 22.2 % — ABNORMAL LOW (ref 39.0–52.0)
Hemoglobin: 7.8 g/dL — ABNORMAL LOW (ref 13.0–17.0)
LYMPHS ABS: 1.5 10*3/uL (ref 0.7–4.0)
LYMPHS PCT: 66 % — AB (ref 12–46)
MCH: 33.9 pg (ref 26.0–34.0)
MCHC: 35.1 g/dL (ref 30.0–36.0)
MCV: 96.5 fL (ref 78.0–100.0)
Monocytes Absolute: 0.3 10*3/uL (ref 0.1–1.0)
Monocytes Relative: 11 % (ref 3–12)
Neutro Abs: 0.5 10*3/uL — ABNORMAL LOW (ref 1.7–7.7)
Neutrophils Relative %: 20 % — ABNORMAL LOW (ref 43–77)
PLATELETS: 88 10*3/uL — AB (ref 150–400)
RBC: 2.3 MIL/uL — ABNORMAL LOW (ref 4.22–5.81)
RDW: 16.6 % — AB (ref 11.5–15.5)
WBC: 2.4 10*3/uL — AB (ref 4.0–10.5)

## 2014-08-26 LAB — I-STAT TROPONIN, ED: Troponin i, poc: 0.01 ng/mL (ref 0.00–0.08)

## 2014-08-26 LAB — ABO/RH: ABO/RH(D): O POS

## 2014-08-26 LAB — POC OCCULT BLOOD, ED: Fecal Occult Bld: NEGATIVE

## 2014-08-26 MED ORDER — OXYCODONE-ACETAMINOPHEN 5-325 MG PO TABS: 1.0000 | ORAL_TABLET | Freq: Four times a day (QID) | ORAL | Status: DC | PRN

## 2014-08-26 MED ORDER — SODIUM CHLORIDE 0.9 % IV SOLN: 10.0000 mL/h | Freq: Once | INTRAVENOUS | Status: DC

## 2014-08-26 NOTE — ED Notes (Signed)
Pt with Lung Cancer was being seen at the cancer center today because he is neutropenic. Also was having chest pain and EKG done at Cancer center and brought to the ED. Presently taking Chemo treatments. Pt in NAD. Chest Pain is better at this time at 2/10.

## 2014-08-26 NOTE — Discharge Instructions (Signed)
Return to the oncology clinic tomorrow at the given time for a blood transfusion.  Return in the meantime if your symptoms substantially worsen or change.   Anemia, Nonspecific Anemia is a condition in which the concentration of red blood cells or hemoglobin in the blood is below normal. Hemoglobin is a substance in red blood cells that carries oxygen to the tissues of the body. Anemia results in not enough oxygen reaching these tissues.  CAUSES  Common causes of anemia include:   Excessive bleeding. Bleeding may be internal or external. This includes excessive bleeding from periods (in women) or from the intestine.   Poor nutrition.   Chronic kidney, thyroid, and liver disease.  Bone marrow disorders that decrease red blood cell production.  Cancer and treatments for cancer.  HIV, AIDS, and their treatments.  Spleen problems that increase red blood cell destruction.  Blood disorders.  Excess destruction of red blood cells due to infection, medicines, and autoimmune disorders. SIGNS AND SYMPTOMS   Minor weakness.   Dizziness.   Headache.  Palpitations.   Shortness of breath, especially with exercise.   Paleness.  Cold sensitivity.  Indigestion.  Nausea.  Difficulty sleeping.  Difficulty concentrating. Symptoms may occur suddenly or they may develop slowly.  DIAGNOSIS  Additional blood tests are often needed. These help your health care provider determine the best treatment. Your health care provider will check your stool for blood and look for other causes of blood loss.  TREATMENT  Treatment varies depending on the cause of the anemia. Treatment can include:   Supplements of iron, vitamin R32, or folic acid.   Hormone medicines.   A blood transfusion. This may be needed if blood loss is severe.   Hospitalization. This may be needed if there is significant continual blood loss.   Dietary changes.  Spleen removal. HOME CARE  INSTRUCTIONS Keep all follow-up appointments. It often takes many weeks to correct anemia, and having your health care provider check on your condition and your response to treatment is very important. SEEK IMMEDIATE MEDICAL CARE IF:   You develop extreme weakness, shortness of breath, or chest pain.   You become dizzy or have trouble concentrating.  You develop heavy vaginal bleeding.   You develop a rash.   You have bloody or black, tarry stools.   You faint.   You vomit up blood.   You vomit repeatedly.   You have abdominal pain.  You have a fever or persistent symptoms for more than 2-3 days.   You have a fever and your symptoms suddenly get worse.   You are dehydrated.  MAKE SURE YOU:  Understand these instructions.  Will watch your condition.  Will get help right away if you are not doing well or get worse. Document Released: 11/24/2004 Document Revised: 06/19/2013 Document Reviewed: 04/12/2013 [facility] Patient Information 2015 [place], [place]. This information is not intended to replace advice given to you by your health care provider. Make sure you discuss any questions you have with your health care provider.

## 2014-08-26 NOTE — ED Provider Notes (Signed)
CSN: [id number]     Arrival date & time 08/26/14  1042 History   First MD Initiated Contact with Patient 08/26/14 1247     Chief Complaint  Patient presents with  . Neutropenia  . Chest Pain  . Shortness of Breath     (Consider location/radiation/quality/duration/timing/severity/associated sxs/prior Treatment) HPI Comments: Patient is a 73 year old male with history of lung cancer. He is a patient of Dr. Earlie Server in is undergoing chemotherapy. He was seen in the office yesterday and had laboratory work performed. He was called today and informed that his white count was extremely low. He states that he has been having chest discomfort intermittently for the past week. He denies to me he is having any fevers or chills and denies any nausea, diaphoresis, or shortness of breath. He was told by the oncology office to come here to be evaluated.  Patient is a 73 y.o. male presenting with chest pain and shortness of breath. The history is provided by the patient.  Chest Pain Pain location:  Substernal area Pain quality: tightness   Pain radiates to:  Does not radiate Pain radiates to the back: no   Pain severity:  Moderate Onset quality:  Gradual Duration:  1 week Timing:  Intermittent Chronicity:  New Relieved by:  Nothing Worsened by:  Nothing tried Associated symptoms: shortness of breath   Shortness of Breath Associated symptoms: chest pain     Past Medical History  Diagnosis Date  . Allergic rhinitis   . BPH (benign prostatic hyperplasia)   . GERD (gastroesophageal reflux disease)   . COPD (chronic obstructive pulmonary disease)   . Shortness of breath   . Arthritis   . Enlarged prostate   . Hypertension     dr Margreta Journey little in [place]   pcp      dr Stanford Breed  cardiac md  . Mental disorder   . Constipation   . Cancer Lung  . 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 Chronic chest pain   . Pneumonia     hx   Past Surgical History  Procedure Laterality  Date  . Ankle surgery  2005  . Prostate surgery  11/25/2010  . Hernia repair    . Mediastinoscopy  06/11/2012    Procedure: MEDIASTINOSCOPY;  Surgeon: Grace Isaac, MD;  Location: [facility];  Service: Thoracic;  Laterality: N/A;  . Inguinal hernia repair      left  . Eye surgery      cataract right  . Video bronchoscopy with endobronchial ultrasound  10/08/2012    Procedure: VIDEO BRONCHOSCOPY WITH ENDOBRONCHIAL ULTRASOUND;  Surgeon: Collene Gobble, MD;  Location: [facility];  Service: Pulmonary;  Laterality: N/A;  . Video bronchoscopy Bilateral 04/11/2014    Procedure: VIDEO BRONCHOSCOPY WITHOUT FLUORO;  Surgeon: Collene Gobble, MD;  Location: [facility];  Service: Cardiopulmonary;  Laterality: Bilateral;   Family History  Problem Relation Age of Onset  . Heart disease Mother   . Arthritis Sister   . Arthritis Sister   . Liver cancer Father    History  Substance Use Topics  . Smoking status: Former Smoker -- 2.00 packs/day for 50 years    Types: Cigarettes    Quit date: 12/26/2009  . Smokeless tobacco: Never Used  . Alcohol Use: 14.4 oz/week    24 Cans of beer per week     Comment: 4 beers every night    Review of Systems  Respiratory: Positive for shortness of breath.  Cardiovascular: Positive for chest pain.  All other systems reviewed and are negative.     Allergies  Ketorolac tromethamine; Codeine; Morphine and related; Tramadol; and Tussionex pennkinetic er  Home Medications   Prior to Admission medications   Medication Sig Start Date End Date Taking? Authorizing Provider  albuterol (PROVENTIL HFA;VENTOLIN HFA) 108 (90 BASE) MCG/ACT inhaler Inhale 2 puffs into the lungs every 6 (six) hours as needed for wheezing or shortness of breath.   Yes Historical Provider, MD  albuterol (PROVENTIL) (2.5 MG/3ML) 0.083% nebulizer solution Take 3 mLs (2.5 mg total) by nebulization every 6 (six) hours as needed for wheezing. 04/14/14  Yes Collene Gobble, MD  dexamethasone  (DECADRON) 4 MG tablet Take 1 tablet by mouth twice a day, the day before, the day of and the day after chemotherapy. Take with food 05/19/14  Yes Adrena E Johnson, PA-C  flurazepam (DALMANE) 30 MG capsule Take 30 mg by mouth at bedtime as needed for sleep.   Yes Historical Provider, MD  folic acid (FOLVITE) 1 MG tablet Take 1 tablet (1 mg total) by mouth daily. 05/19/14  Yes Adrena E Johnson, PA-C  naproxen (NAPROSYN) 500 MG tablet Take 500 mg by mouth 2 (two) times daily with a meal.   Yes Historical Provider, MD  nebivolol (BYSTOLIC) 10 MG tablet Take 10 mg by mouth 2 (two) times daily. 1 tab bid   Yes Historical Provider, MD  omeprazole (PRILOSEC) 20 MG capsule Take 20 mg by mouth 2 (two) times daily before a meal.    Yes Historical Provider, MD  ondansetron (ZOFRAN) 8 MG tablet Take 1 tablet (8 mg total) by mouth every 8 (eight) hours as needed for nausea or vomiting. 07/28/14  Yes Maryanna Shape, NP  oxyCODONE-acetaminophen (PERCOCET/ROXICET) 5-325 MG per tablet Take 1-2 tablets by mouth every 6 (six) hours as needed for severe pain. 08/26/14  Yes Drue Second, NP  prochlorperazine (COMPAZINE) 10 MG tablet Take 1 tablet (10 mg total) by mouth every 6 (six) hours as needed for nausea or vomiting. 07/28/14  Yes Maryanna Shape, NP  Tamsulosin HCl (FLOMAX) 0.4 MG CAPS Take 0.4 mg by mouth every morning.    Yes Historical Provider, MD   BP 133/68  Pulse 62  Temp(Src) 97.6 F (36.4 C) (Oral)  Resp 18  SpO2 100% Physical Exam  Nursing note and vitals reviewed. Constitutional: He is oriented to person, place, and time. He appears well-developed. No distress.  HENT:  Head: Normocephalic and atraumatic.  Mouth/Throat: Oropharynx is clear and moist.  Neck: Normal range of motion. Neck supple.  Cardiovascular: Normal rate, regular rhythm and normal heart sounds.   No murmur heard. Pulmonary/Chest: Effort normal and breath sounds normal. No respiratory distress. He has no wheezes.  Abdominal:  Soft. Bowel sounds are normal. He exhibits no distension. There is no tenderness.  Musculoskeletal: Normal range of motion. He exhibits no edema.  Lymphadenopathy:    He has no cervical adenopathy.  Neurological: He is alert and oriented to person, place, and time.  Skin: Skin is warm and dry. He is not diaphoretic.    ED Course  Procedures (including critical care time) Labs Review Labs Reviewed  CULTURE, BLOOD (ROUTINE X 2)  CULTURE, BLOOD (ROUTINE X 2)  CBC WITH DIFFERENTIAL  URINALYSIS, ROUTINE W REFLEX MICROSCOPIC  I-STAT TROPOININ, ED    Imaging Review Dg Chest 2 View  08/26/2014   CLINICAL DATA:  Chest pain and shortness of breath. Neutropenia. History of left lung  cancer.  EXAM: CHEST  2 VIEW  COMPARISON:  Chest x-ray dated 11/13/2013 and chest CT scans dated 07/28/2014 band 03/25/2014  FINDINGS: There are no acute infiltrates or effusions. The scarring in the left lower lobe is stable. Heart size and pulmonary vascularity are normal. Large hiatal hernia, stable.  On the lateral view there is slight loss of the definition of the superior endplate and anterior margin of T6. There is no definitive bone destruction. That vertebral body appeared normal on the recent chest CT scan of 07/28/2014.  IMPRESSION: No acute abnormality in the lungs. Osteopenia of what appears to be T6 on the lateral view is indeterminate. Does the patient have mid thoracic back pain? If so, a limited CT scan through that area may be useful to exclude a metastasis. However, that vertebral body appeared normal on the CT scan dated 07/28/2014.   Electronically Signed   By: Rozetta Nunnery M.D.   On: 08/26/2014 12:11     EKG Interpretation None      MDM   Final diagnoses:  None    Patient is a 73 year old male with history of metastatic lung cancer. He was told to come here due to low blood counts. His white cell count was 0.5 yesterday. When he was informed of this number over the phone, he apparently told  the nurse that he was experiencing some chest discomfort. Workup today reveals no evidence for a cardiac etiology. Repeat white count was 2.4. He does have a hemoglobin of 7.8 which is a drop from yesterday of 9.7. He is heme-negative and does not report any melena or bloody stools.  I have discussed the results of the workup with Dr. Lindi Adie who feels as though transfusion is indicated. He will make arrangements for the patient to return tomorrow to receive 2 units of blood in the oncology clinic.    Veryl Speak, MD 08/26/14 [phone]

## 2014-08-26 NOTE — ED Notes (Signed)
Bed: IT19 Expected date:  Expected time:  Means of arrival:  Comments: EMS-chest pain

## 2014-08-27 ENCOUNTER — Other Ambulatory Visit: Payer: Self-pay | Admitting: *Deleted

## 2014-08-27 ENCOUNTER — Telehealth: Payer: Self-pay | Admitting: Medical Oncology

## 2014-08-27 ENCOUNTER — Non-Acute Institutional Stay (HOSPITAL_COMMUNITY)
Admission: AD | Admit: 2014-08-27 | Discharge: 2014-08-27 | Disposition: A | Discharge status: Home / Self Care | Payer: Medicare Other | Source: Ambulatory Visit | Attending: Hematology and Oncology | Admitting: Hematology and Oncology

## 2014-08-27 ENCOUNTER — Other Ambulatory Visit: Payer: Self-pay | Admitting: Hematology and Oncology

## 2014-08-27 ENCOUNTER — Encounter: Payer: Self-pay | Admitting: Nurse Practitioner

## 2014-08-27 DIAGNOSIS — D61818 Other pancytopenia: Secondary | ICD-10-CM | POA: Diagnosis not present

## 2014-08-27 DIAGNOSIS — D508 Other iron deficiency anemias: Secondary | ICD-10-CM

## 2014-08-27 LAB — PREPARE RBC (CROSSMATCH)

## 2014-08-27 MED ORDER — DIPHENHYDRAMINE HCL 25 MG PO CAPS
25.0000 mg | ORAL_CAPSULE | Freq: Once | ORAL | Status: AC
  Administered 2014-08-27: 25 mg via ORAL
  Filled 2014-08-27: qty 1

## 2014-08-27 MED ORDER — SODIUM CHLORIDE 0.9 % IJ SOLN: 10.0000 mL | INTRAMUSCULAR | Status: DC | PRN

## 2014-08-27 MED ORDER — ACETAMINOPHEN 325 MG PO TABS
650.0000 mg | ORAL_TABLET | Freq: Once | ORAL | Status: AC
  Administered 2014-08-27: 650 mg via ORAL
  Filled 2014-08-27: qty 2

## 2014-08-27 MED ORDER — SODIUM CHLORIDE 0.9 % IV SOLN
250.0000 mL | Freq: Once | INTRAVENOUS | Status: AC
  Administered 2014-08-27: 250 mL via INTRAVENOUS

## 2014-08-27 NOTE — Progress Notes (Signed)
SYMPTOM MANAGEMENT CLINIC   HPI: Tyler [name] 73 y.o. male diagnosed with lung cancer.  Currently undergoing carboplatin/Alimta chemotherapy regimen.    Patient called the cancer Center today requesting urgent care visit.  Patient states that he was awakened at 2 AM on Friday night/Saturday morning with acute chest pain and increase shortness of breath.  He states that the chest pain is intermittent; and he last experienced chest pain earlier this morning.  He is also complaining of some diaphoresis; but denies any fever or chills.  He continues to complain of some chronic nausea; but no vomiting.  He states that he is also suffering from some chronic constipation; but did have a bowel movement just yesterday.  He is also complaining of some chronic generalized pain; and states that the oxycodone is no lower effective in managing his pain.  He is requesting either an increase in his oxycodone; or switching to a different pain medication.  He's also complaining of minimal appetite; and continues to lose weight.  Patient reports that he has increased his oxygen use some: Stating that he was simply using the oxygen only at night-but now is using the oxygen on a 24/7 basis.  He denies any pain with inspiration.  He denies any chest pain radiation.   Shortness of Breath    CURRENT THERAPY: Upcoming Treatment Dates - LUNG Pemetrexed (Alimta) / Carboplatin q21d Days with orders from any treatment category:  09/08/2014      [facility] ONC SCHEDULING COMMUNICATION      ondansetron (ZOFRAN) IVPB 16 mg      Dexamethasone Sodium Phosphate (DECADRON) injection 20 mg      PEMEtrexed (ALIMTA) 1,150 mg in sodium chloride 0.9 % 100 mL chemo infusion      CARBOplatin (PARAPLATIN) in sodium chloride 0.9 % 100 mL chemo infusion      sodium chloride 0.9 % injection 10 mL      heparin lock flush 100 unit/mL      heparin lock flush 100 unit/mL      alteplase (CATHFLO ACTIVASE) injection 2 mg      sodium  chloride 0.9 % injection 3 mL      cyanocobalamin ((VITAMIN B-12)) injection 1,000 mcg      0.9 %  sodium chloride infusion      TREATMENT CONDITIONS    Review of Systems  Respiratory: Positive for shortness of breath.     Past Medical History  Diagnosis Date  . Allergic rhinitis   . BPH (benign prostatic hyperplasia)   . GERD (gastroesophageal reflux disease)   . COPD (chronic obstructive pulmonary disease)   . Shortness of breath   . Arthritis   . Enlarged prostate   . Hypertension     dr Margreta Journey little in [place]   pcp      dr Stanford Breed  cardiac md  . Mental disorder   . Constipation   . Cancer Lung  . 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 Chronic chest pain   . Pneumonia     hx    Past Surgical History  Procedure Laterality Date  . Ankle surgery  2005  . Prostate surgery  11/25/2010  . Hernia repair    . Mediastinoscopy  06/11/2012    Procedure: MEDIASTINOSCOPY;  Surgeon: Grace Isaac, MD;  Location: [facility];  Service: Thoracic;  Laterality: N/A;  . Inguinal hernia repair      left  . Eye surgery  cataract right  . Video bronchoscopy with endobronchial ultrasound  10/08/2012    Procedure: VIDEO BRONCHOSCOPY WITH ENDOBRONCHIAL ULTRASOUND;  Surgeon: Collene Gobble, MD;  Location: [facility];  Service: Pulmonary;  Laterality: N/A;  . Video bronchoscopy Bilateral 04/11/2014    Procedure: VIDEO BRONCHOSCOPY WITHOUT FLUORO;  Surgeon: Collene Gobble, MD;  Location: [facility];  Service: Cardiopulmonary;  Laterality: Bilateral;    has COPD (chronic obstructive pulmonary disease); Lung mass; Preop cardiovascular exam; Chest pain; Hypertension; Acute delirium; Dyspnea; Chronic respiratory failure; Lung cancer; Neoplasm related pain; Constipation; Nausea without vomiting; Anorexia; Weight loss; Neoplastic malignant related fatigue; and Other pancytopenia on his problem list.     is allergic to ketorolac tromethamine; codeine; morphine and related; tramadol;  and tussionex pennkinetic er.    Medication List       This list is accurate as of: 08/26/14 10:43 AM.  Always use your most recent med list.               albuterol (2.5 MG/3ML) 0.083% nebulizer solution  Commonly known as:  PROVENTIL  Take 3 mLs (2.5 mg total) by nebulization every 6 (six) hours as needed for wheezing.     dexamethasone 4 MG tablet  Commonly known as:  DECADRON  Take 1 tablet by mouth twice a day, the day before, the day of and the day after chemotherapy. Take with food     FLOMAX 0.4 MG Caps capsule  Generic drug:  tamsulosin  Take 0.4 mg by mouth every morning.     flurazepam 30 MG capsule  Commonly known as:  DALMANE  Take 30 mg by mouth at bedtime as needed for sleep.     folic acid 1 MG tablet  Commonly known as:  FOLVITE  Take 1 tablet (1 mg total) by mouth daily.     naproxen 500 MG tablet  Commonly known as:  NAPROSYN  Take 500 mg by mouth 2 (two) times daily with a meal.     nebivolol 10 MG tablet  Commonly known as:  BYSTOLIC  Take 10 mg by mouth 2 (two) times daily. 1 tab bid     omeprazole 20 MG capsule  Commonly known as:  PRILOSEC  Take 20 mg by mouth 2 (two) times daily before a meal.     ondansetron 8 MG tablet  Commonly known as:  ZOFRAN  Take 1 tablet (8 mg total) by mouth every 8 (eight) hours as needed for nausea or vomiting.     oxyCODONE-acetaminophen 5-325 MG per tablet  Commonly known as:  PERCOCET/ROXICET  Take 1-2 tablets by mouth every 6 (six) hours as needed for severe pain.     prochlorperazine 10 MG tablet  Commonly known as:  COMPAZINE  Take 1 tablet (10 mg total) by mouth every 6 (six) hours as needed for nausea or vomiting.         PHYSICAL EXAMINATION  Blood pressure 118/63, pulse 67, temperature 97.4 F (36.3 C), temperature source Oral, resp. rate 18, height 6\' 2"  (1.88 m), weight 220 lb 6.4 oz (99.973 kg), SpO2 99.00%.  Physical Exam  Nursing note and vitals reviewed. Constitutional: He is  oriented to person, place, and time. Vital signs are normal. He appears unhealthy.  HENT:  Head: Normocephalic.  Mouth/Throat: Oropharynx is clear and moist.  Eyes: Conjunctivae and EOM are normal. Pupils are equal, round, and reactive to light. No scleral icterus.  Neck: Normal range of motion. Neck supple. No JVD present. No tracheal deviation  present. No thyromegaly present.  Cardiovascular: Normal rate, regular rhythm, normal heart sounds and intact distal pulses.   Pulmonary/Chest: Breath sounds normal. No respiratory distress. He has no wheezes. He has no rales.  Patient does appear slightly short of breath.  The patient with O2 at 2 L via nasal cannula.  No acute respiratory noted.  Abdominal: Soft. Bowel sounds are normal. He exhibits no distension and no mass. There is no tenderness. There is no rebound and no guarding.  Musculoskeletal: Normal range of motion. He exhibits no edema and no tenderness.  Lymphadenopathy:    He has no cervical adenopathy.  Neurological: He is alert and oriented to person, place, and time. Gait normal.  Skin: Skin is warm and dry. No rash noted. No erythema.  Psychiatric: Affect normal.    LABORATORY DATA:. Admission on 08/26/2014, Discharged on 08/26/2014  Component Date Value Ref Range Status  . Troponin i, poc 08/26/2014 0.01  0.00 - 0.08 ng/mL Final  . Comment 3 08/26/2014          Final   Comment: Due to the release kinetics of cTnI,                          a negative result within the first hours                          of the onset of symptoms does not rule out                          myocardial infarction with certainty.                          If myocardial infarction is still suspected,                          repeat the test at appropriate intervals.  . WBC 08/26/2014 2.4* 4.0 - 10.5 K/uL Final  . RBC 08/26/2014 2.30* 4.22 - 5.81 MIL/uL Final  . Hemoglobin 08/26/2014 7.8* 13.0 - 17.0 g/dL Final  . HCT 08/26/2014 22.2* 39.0 - 52.0 %  Final  . MCV 08/26/2014 96.5  78.0 - 100.0 fL Final  . MCH 08/26/2014 33.9  26.0 - 34.0 pg Final  . MCHC 08/26/2014 35.1  30.0 - 36.0 g/dL Final  . RDW 08/26/2014 16.6* 11.5 - 15.5 % Final  . Platelets 08/26/2014 88* 150 - 400 K/uL Final   Comment: REPEATED TO VERIFY                          SPECIMEN CHECKED FOR CLOTS                          PLATELET COUNT CONFIRMED BY SMEAR  . Neutrophils Relative % 08/26/2014 20* 43 - 77 % Final  . Lymphocytes Relative 08/26/2014 66* 12 - 46 % Final  . Monocytes Relative 08/26/2014 11  3 - 12 % Final  . Eosinophils Relative 08/26/2014 3  0 - 5 % Final  . Basophils Relative 08/26/2014 0  0 - 1 % Final  . Neutro Abs 08/26/2014 0.5* 1.7 - 7.7 K/uL Final  . Lymphs Abs 08/26/2014 1.5  0.7 - 4.0 K/uL Final  . Monocytes Absolute 08/26/2014 0.3  0.1 - 1.0  K/uL Final  . Eosinophils Absolute 08/26/2014 0.1  0.0 - 0.7 K/uL Final  . Basophils Absolute 08/26/2014 0.0  0.0 - 0.1 K/uL Final  . RBC Morphology 08/26/2014 POLYCHROMASIA PRESENT   Final  . Color, Urine 08/26/2014 YELLOW  YELLOW Final  . APPearance 08/26/2014 CLEAR  CLEAR Final  . Specific Gravity, Urine 08/26/2014 1.023  1.005 - 1.030 Final  . pH 08/26/2014 6.0  5.0 - 8.0 Final  . Glucose, UA 08/26/2014 NEGATIVE  NEGATIVE mg/dL Final  . Hgb urine dipstick 08/26/2014 NEGATIVE  NEGATIVE Final  . Bilirubin Urine 08/26/2014 NEGATIVE  NEGATIVE Final  . Ketones, ur 08/26/2014 NEGATIVE  NEGATIVE mg/dL Final  . Protein, ur 08/26/2014 NEGATIVE  NEGATIVE mg/dL Final  . Urobilinogen, UA 08/26/2014 1.0  0.0 - 1.0 mg/dL Final  . Nitrite 08/26/2014 NEGATIVE  NEGATIVE Final  . Leukocytes, UA 08/26/2014 NEGATIVE  NEGATIVE Final   MICROSCOPIC NOT DONE ON URINES WITH NEGATIVE PROTEIN, BLOOD, LEUKOCYTES, NITRITE, OR GLUCOSE <1000 mg/dL.  Marland Kitchen Specimen Description 08/26/2014 BLOOD LEFT ANTECUBITAL   Final  . Special Requests 08/26/2014 BOTTLES DRAWN AEROBIC AND ANAEROBIC 5ML   Final  . Culture  Setup Time 08/26/2014     Final                   Value:08/26/2014 20:54                         Performed at [facility]  . Culture 08/26/2014    Final                   Value:       BLOOD CULTURE RECEIVED NO GROWTH TO DATE CULTURE WILL BE HELD FOR 5 DAYS BEFORE ISSUING A FINAL NEGATIVE REPORT                         Performed at [facility]  . Report Status 08/26/2014 PENDING   Incomplete  . Specimen Description 08/26/2014 BLOOD RIGHT ANTECUBITAL   Final  . Special Requests 08/26/2014 BOTTLES DRAWN AEROBIC AND ANAEROBIC 10CC   Final  . Culture  Setup Time 08/26/2014    Final                   Value:08/26/2014 20:54                         Performed at [facility]  . Culture 08/26/2014    Final                   Value:       BLOOD CULTURE RECEIVED NO GROWTH TO DATE CULTURE WILL BE HELD FOR 5 DAYS BEFORE ISSUING A FINAL NEGATIVE REPORT                         Performed at [facility]  . Report Status 08/26/2014 PENDING   Incomplete  . Fecal Occult Bld 08/26/2014 NEGATIVE  NEGATIVE Final  . ABO/RH(D) 08/26/2014 O POS   Final  . Antibody Screen 08/26/2014 NEG   Final  . Sample Expiration 08/26/2014 08/29/2014   Final  . ABO/RH(D) 08/26/2014 O POS   Final  Appointment on 08/25/2014  Component Date Value Ref Range Status  . WBC 08/25/2014 2.2* 4.0 - 10.3 10e3/uL Final  . NEUT# 08/25/2014 0.5* 1.5 - 6.5 10e3/uL Final  .  HGB 08/25/2014 9.7* 13.0 - 17.1 g/dL Final  . HCT 08/25/2014 28.4* 38.4 - 49.9 % Final  . Platelets 08/25/2014 120* 140 - 400 10e3/uL Final  . MCV 08/25/2014 99.8* 79.3 - 98.0 fL Final  . MCH 08/25/2014 34.0* 27.2 - 33.4 pg Final  . MCHC 08/25/2014 34.1  32.0 - 36.0 g/dL Final  . RBC 08/25/2014 2.85* 4.20 - 5.82 10e6/uL Final  . RDW 08/25/2014 19.3* 11.0 - 14.6 % Final  . lymph# 08/25/2014 1.5  0.9 - 3.3 10e3/uL Final  . MONO# 08/25/2014 0.1  0.1 - 0.9 10e3/uL Final  . Eosinophils Absolute 08/25/2014 0.1  0.0 - 0.5 10e3/uL Final  . Basophils Absolute  08/25/2014 0.0  0.0 - 0.1 10e3/uL Final  . NEUT% 08/25/2014 21.0* 39.0 - 75.0 % Final  . LYMPH% 08/25/2014 68.1* 14.0 - 49.0 % Final  . MONO% 08/25/2014 6.8  0.0 - 14.0 % Final  . EOS% 08/25/2014 2.8  0.0 - 7.0 % Final  . BASO% 08/25/2014 1.3  0.0 - 2.0 % Final  . Sodium 08/25/2014 137  136 - 145 mEq/L Final  . Potassium 08/25/2014 4.1  3.5 - 5.1 mEq/L Final  . Chloride 08/25/2014 103  98 - 109 mEq/L Final  . CO2 08/25/2014 26  22 - 29 mEq/L Final  . Glucose 08/25/2014 118  70 - 140 mg/dl Final  . BUN 08/25/2014 23.5  7.0 - 26.0 mg/dL Final  . Creatinine 08/25/2014 1.0  0.7 - 1.3 mg/dL Final  . Total Bilirubin 08/25/2014 0.50  0.20 - 1.20 mg/dL Final  . Alkaline Phosphatase 08/25/2014 94  40 - 150 U/L Final  . AST 08/25/2014 22  5 - 34 U/L Final  . ALT 08/25/2014 19  0 - 55 U/L Final  . Total Protein 08/25/2014 6.8  6.4 - 8.3 g/dL Final  . Albumin 08/25/2014 3.7  3.5 - 5.0 g/dL Final  . Calcium 08/25/2014 9.2  8.4 - 10.4 mg/dL Final  . Anion Gap 08/25/2014 8  3 - 11 mEq/L Final   EKG: BILLAL, ROLLO TO:[id number] 26-Aug-2014 09:11:39 [facility] ONC ROUTINE RECORD Normal sinus rhythm Normal ECG Confirmed by Caryl Comes MD, STEVE ([id number]) on 08/26/2014 12:55:24 PM 63mm/s 91mm/mV 100Hz  8.0 SP2 12SL 237 CID: [id number] Referred by: Confirmed By: Jolyn Nap MD Vent. rate 62 BPM PR interval 186 ms QRS duration 86 ms QT/QTc 416/422 ms P-R-T axes 67 55 65 May 16, 1941 (73 yr) Male Caucasian 74in 229lb Room: Loc:511 Technician: LC Test ind:  RADIOGRAPHIC STUDIES: Dg Chest 2 View  08/26/2014   CLINICAL DATA:  Chest pain and shortness of breath. Neutropenia. History of left lung cancer.  EXAM: CHEST  2 VIEW  COMPARISON:  Chest x-ray dated 11/13/2013 and chest CT scans dated 07/28/2014 band 03/25/2014  FINDINGS: There are no acute infiltrates or effusions. The scarring in the left lower lobe is stable. Heart size and pulmonary vascularity are normal. Large hiatal hernia, stable.   On the lateral view there is slight loss of the definition of the superior endplate and anterior margin of T6. There is no definitive bone destruction. That vertebral body appeared normal on the recent chest CT scan of 07/28/2014.  IMPRESSION: No acute abnormality in the lungs. Osteopenia of what appears to be T6 on the lateral view is indeterminate. Does the patient have mid thoracic back pain? If so, a limited CT scan through that area may be useful to exclude a metastasis. However, that vertebral body appeared normal on the CT scan dated  07/28/2014.   Electronically Signed   By: Rozetta Nunnery M.D.   On: 08/26/2014 12:11    ASSESSMENT/PLAN:    Chest pain  Assessment & Plan Patient continues to complain of intermittent chest pain; and actually had an episode of chest pain while in the exam room here at the [facility].  He also appears mildly short of breath on exam as well.  Patient continues on oxygen at 2 L per nasal cannula.  EKG obtained at the [facility] revealed a normal sinus rhythm with a rate of 62 and a QTC of 422.  Patient was transferred to the emergency department for further evaluation.  A brief report and history was called to the emergency department charge nurse as well.   Lung cancer  Assessment & Plan Patient received cycle 5 of his carboplatin/Alimta chemotherapy regimen on 08/18/2014.  He is scheduled to return for a lab draw only on 09/01/2014.  He will be due for cycle 6 of the same chemotherapy regimen on 09/08/2014.   Neoplasm related pain  Assessment & Plan Patient continues to complain of some generalized pain.  He states he has been taking oxycodone 1 tablet every 4 hours.  Advised patient that he could increase the oxycodone to one to 2 tablets every 6 hours.  Gave patient a refill of the oxycodone today.   Constipation  Assessment & Plan Appear to-induced constipation.  Patient's last bowel movement was yesterday.  The patient states that he does use  MiraLAX once daily; but continues with some mild, chronic constipation.  Carefully reviewed bowel regimen with both patient and his wife today.  Advised patient to take MiraLAX on an every 4-6 hour basis until he has cleared his constipation.  Then, I patient was advised to continue taking stool softeners twice daily as well as MiraLAX once daily to stay regular.   Nausea without vomiting  Assessment & Plan Patient is also complaining of some mild, chronic nausea; but no vomiting.  Most likely, this is a chemotherapy side effect.  Patient states he already  has Compazine at home to use on an as-needed basis.   Anorexia  Assessment & Plan Patient states he has minimal appetite; and has lost another 3 pounds since his last weight check.  This is most likely a chemotherapy side effect as well.  Patient was encouraged to push protein; and eat multiple small meals throughout the day if possible.   Weight loss  Assessment & Plan Patient continues with minimal appetite; and has lost another 3 pounds since his last weight check.  The patient was encouraged to eat multiple small meals throughout the day.   Neoplastic malignant related fatigue  Assessment & Plan Patient is complaining of some increasing fatigue as well.  Most likely, this is secondary to chemotherapy.  Patient was encouraged to remain as active as possible.   Other pancytopenia  Assessment & Plan Chemotherapy-induced pancytopenia with a white count of 2.2 and an ANC of 0.5.  Reviewed all neutropenia guidelines with both patient and his wife again today.  Hemoglobin remains stable at 9.7.  Platelet count is also stable at 120.   Patient stated understanding of all instructions; and was in agreement with this plan of care. The patient knows to call the clinic with any problems, questions or concerns.   Review/collaboration with Dr. Julien Nordmann regarding all aspects of patient's visit today.   Total time spent with patient was 40  minutes;  with greater than 75 percent of  that time spent in face to face counseling regarding his symptoms, transferred to the emergency department, and coordination of care and follow up.  Disclaimer: This note was dictated with voice recognition software. Similar sounding words can inadvertently be transcribed and may not be corrected upon review.   Drue Second, NP 08/27/2014

## 2014-08-27 NOTE — Procedures (Signed)
[facility]  Procedure Note  Tyler [name] TXH:[id number] DOB: [birth year] DOA: 08/27/2014   PCP: Tamsen Roers, MD   Associated Diagnosis:  Other pancytopenia  Procedure Note: Transfusion of 2 units PRBCs   Condition During Procedure: Pt tolerated well    Condition at Discharge: Pt alert, oriented, ambulatory; no complications noted   Nigel Sloop, [facility]

## 2014-08-27 NOTE — Assessment & Plan Note (Signed)
Patient continues with minimal appetite; and has lost another 3 pounds since his last weight check.  The patient was encouraged to eat multiple small meals throughout the day.

## 2014-08-27 NOTE — Telephone Encounter (Signed)
asking if pt coming for transfusion today. Per terri , Dr Lindi Adie arranged for transfusion at 0900.

## 2014-08-27 NOTE — Assessment & Plan Note (Signed)
Chemotherapy-induced pancytopenia with a white count of 2.2 and an ANC of 0.5.  Reviewed all neutropenia guidelines with both patient and his wife again today.  Hemoglobin remains stable at 9.7.  Platelet count is also stable at 120.

## 2014-08-27 NOTE — Assessment & Plan Note (Addendum)
Patient is also complaining of some mild, chronic nausea; but no vomiting.  Most likely, this is a chemotherapy side effect.  Patient states he already  has Compazine at home to use on an as-needed basis.

## 2014-08-27 NOTE — Assessment & Plan Note (Signed)
Patient is complaining of some increasing fatigue as well.  Most likely, this is secondary to chemotherapy.  Patient was encouraged to remain as active as possible.

## 2014-08-27 NOTE — Assessment & Plan Note (Signed)
Patient continues to complain of some generalized pain.  He states he has been taking oxycodone 1 tablet every 4 hours.  Advised patient that he could increase the oxycodone to one to 2 tablets every 6 hours.  Gave patient a refill of the oxycodone today.

## 2014-08-27 NOTE — Assessment & Plan Note (Addendum)
Patient continues to complain of intermittent chest pain; and actually had an episode of chest pain while in the exam room here at the [facility].  He also appears mildly short of breath on exam as well.  Patient continues on oxygen at 2 L per nasal cannula.  EKG obtained at the [facility] revealed a normal sinus rhythm with a rate of 62 and a QTC of 422.  Patient was transferred to the emergency department for further evaluation.  A brief report and history was called to the emergency department charge nurse as well.

## 2014-08-27 NOTE — Assessment & Plan Note (Signed)
Appear to-induced constipation.  Patient's last bowel movement was yesterday.  The patient states that he does use MiraLAX once daily; but continues with some mild, chronic constipation.  Carefully reviewed bowel regimen with both patient and his wife today.  Advised patient to take MiraLAX on an every 4-6 hour basis until he has cleared his constipation.  Then, I patient was advised to continue taking stool softeners twice daily as well as MiraLAX once daily to stay regular.

## 2014-08-27 NOTE — Progress Notes (Signed)
Pt arrived at day hospital for infusion of 2 units PRBCs; pt has blood band on from 08/26/14; Dr. Arvilla Market office notified to receive orders for transfusion

## 2014-08-27 NOTE — Assessment & Plan Note (Signed)
Patient received cycle 5 of his carboplatin/Alimta chemotherapy regimen on 08/18/2014.  He is scheduled to return for a lab draw only on 09/01/2014.  He will be due for cycle 6 of the same chemotherapy regimen on 09/08/2014.

## 2014-08-27 NOTE — Assessment & Plan Note (Signed)
Patient states he has minimal appetite; and has lost another 3 pounds since his last weight check.  This is most likely a chemotherapy side effect as well.  Patient was encouraged to push protein; and eat multiple small meals throughout the day if possible.

## 2014-08-28 ENCOUNTER — Telehealth: Payer: Self-pay | Admitting: *Deleted

## 2014-08-28 DIAGNOSIS — C3402 Malignant neoplasm of left main bronchus: Secondary | ICD-10-CM

## 2014-08-28 LAB — TYPE AND SCREEN
ABO/RH(D): O POS
Antibody Screen: NEGATIVE
UNIT DIVISION: 0
Unit division: 0

## 2014-08-28 MED ORDER — OXYCODONE-ACETAMINOPHEN 5-325 MG PO TABS: 1.0000 | ORAL_TABLET | Freq: Four times a day (QID) | ORAL | Status: DC | PRN

## 2014-08-28 NOTE — Telephone Encounter (Signed)
Patient's sister, Jana Half, called and left VM stating that when pt got send to the ED for a chest x-ray that someone there took his paper prescription of oxycodone (prescribed by Selena Lesser, NP) and forgot to give it back to him. She asks that we please call pt about this.  Called pt and he states he is at a luncheon and there was a lot of noise in the background and asked for me to call him back later since he couldn't hear me.

## 2014-08-28 NOTE — Telephone Encounter (Signed)
Pt went for transfusion 10/28. States he is feeling much better after getting blood. Denies SOB.

## 2014-08-28 NOTE — Telephone Encounter (Addendum)
Spoke with pt and he states that after he left [facility] and went to the ED he gave one of the people that works there some papers and it had the pain medicine script with it. He states he never got it back. Per Selena Lesser, NP okay to refill prescription for Percocet. Script placed in binder in the injection room. Told pt to bring a photo ID when picking it up tomorrow between 9am-4pm. Pt agreeable to this and very appreciative of [place] refilling it.

## 2014-08-28 NOTE — Telephone Encounter (Signed)
Message copied by Patton Salles on Thu Aug 28, 2014 10:47 AM ------      Message from: Drue Second R      Created: Wed Aug 27, 2014  9:22 AM       PROVIDER:  Julu[facility] Rainier ONLY      Triage: follow up call 24-48 hours please. ------

## 2014-08-28 NOTE — Addendum Note (Signed)
Addended by: Christa See on: 08/28/2014 04:33 PM   Modules accepted: Orders

## 2014-09-01 ENCOUNTER — Other Ambulatory Visit (HOSPITAL_BASED_OUTPATIENT_CLINIC_OR_DEPARTMENT_OTHER): Payer: Medicare Other

## 2014-09-01 DIAGNOSIS — C3432 Malignant neoplasm of lower lobe, left bronchus or lung: Secondary | ICD-10-CM

## 2014-09-01 DIAGNOSIS — C3490 Malignant neoplasm of unspecified part of unspecified bronchus or lung: Secondary | ICD-10-CM

## 2014-09-01 LAB — CULTURE, BLOOD (ROUTINE X 2)
Culture: NO GROWTH
Culture: NO GROWTH

## 2014-09-01 LAB — CBC WITH DIFFERENTIAL/PLATELET
BASO%: 0.2 % (ref 0.0–2.0)
BASOS ABS: 0 10*3/uL (ref 0.0–0.1)
EOS%: 1.4 % (ref 0.0–7.0)
Eosinophils Absolute: 0.1 10*3/uL (ref 0.0–0.5)
HCT: 34.2 % — ABNORMAL LOW (ref 38.4–49.9)
HEMOGLOBIN: 11.5 g/dL — AB (ref 13.0–17.1)
LYMPH#: 1.9 10*3/uL (ref 0.9–3.3)
LYMPH%: 44.6 % (ref 14.0–49.0)
MCH: 32.8 pg (ref 27.2–33.4)
MCHC: 33.7 g/dL (ref 32.0–36.0)
MCV: 97.3 fL (ref 79.3–98.0)
MONO#: 0.6 10*3/uL (ref 0.1–0.9)
MONO%: 13.2 % (ref 0.0–14.0)
NEUT%: 40.6 % (ref 39.0–75.0)
NEUTROS ABS: 1.7 10*3/uL (ref 1.5–6.5)
Platelets: 65 10*3/uL — ABNORMAL LOW (ref 140–400)
RBC: 3.52 10*6/uL — ABNORMAL LOW (ref 4.20–5.82)
RDW: 18.8 % — AB (ref 11.0–14.6)
WBC: 4.3 10*3/uL (ref 4.0–10.3)

## 2014-09-01 LAB — COMPREHENSIVE METABOLIC PANEL (CC13)
ALK PHOS: 104 U/L (ref 40–150)
ALT: 18 U/L (ref 0–55)
ANION GAP: 7 meq/L (ref 3–11)
AST: 19 U/L (ref 5–34)
Albumin: 3.8 g/dL (ref 3.5–5.0)
BILIRUBIN TOTAL: 0.36 mg/dL (ref 0.20–1.20)
BUN: 18.6 mg/dL (ref 7.0–26.0)
CHLORIDE: 105 meq/L (ref 98–109)
CO2: 28 mEq/L (ref 22–29)
Calcium: 9.6 mg/dL (ref 8.4–10.4)
Creatinine: 1 mg/dL (ref 0.7–1.3)
Glucose: 102 mg/dl (ref 70–140)
POTASSIUM: 4.6 meq/L (ref 3.5–5.1)
Sodium: 140 mEq/L (ref 136–145)
TOTAL PROTEIN: 6.8 g/dL (ref 6.4–8.3)

## 2014-09-08 ENCOUNTER — Encounter: Payer: Self-pay | Admitting: Physician Assistant

## 2014-09-08 ENCOUNTER — Ambulatory Visit (HOSPITAL_BASED_OUTPATIENT_CLINIC_OR_DEPARTMENT_OTHER): Payer: Medicare Other | Admitting: Physician Assistant

## 2014-09-08 ENCOUNTER — Ambulatory Visit (HOSPITAL_BASED_OUTPATIENT_CLINIC_OR_DEPARTMENT_OTHER): Payer: Medicare Other

## 2014-09-08 ENCOUNTER — Other Ambulatory Visit (HOSPITAL_BASED_OUTPATIENT_CLINIC_OR_DEPARTMENT_OTHER): Payer: Medicare Other

## 2014-09-08 VITALS — BP 140/67 | HR 62 | Temp 98.0°F | Resp 17 | Ht 74.0 in | Wt 221.4 lb

## 2014-09-08 DIAGNOSIS — C3490 Malignant neoplasm of unspecified part of unspecified bronchus or lung: Secondary | ICD-10-CM

## 2014-09-08 DIAGNOSIS — C3432 Malignant neoplasm of lower lobe, left bronchus or lung: Secondary | ICD-10-CM

## 2014-09-08 DIAGNOSIS — C778 Secondary and unspecified malignant neoplasm of lymph nodes of multiple regions: Secondary | ICD-10-CM

## 2014-09-08 DIAGNOSIS — C3410 Malignant neoplasm of upper lobe, unspecified bronchus or lung: Secondary | ICD-10-CM

## 2014-09-08 DIAGNOSIS — Z5111 Encounter for antineoplastic chemotherapy: Secondary | ICD-10-CM

## 2014-09-08 LAB — CBC WITH DIFFERENTIAL/PLATELET
BASO%: 0 % (ref 0.0–2.0)
BASOS ABS: 0 10*3/uL (ref 0.0–0.1)
EOS ABS: 0 10*3/uL (ref 0.0–0.5)
EOS%: 0 % (ref 0.0–7.0)
HCT: 34.5 % — ABNORMAL LOW (ref 38.4–49.9)
HEMOGLOBIN: 11.8 g/dL — AB (ref 13.0–17.1)
LYMPH%: 9.9 % — ABNORMAL LOW (ref 14.0–49.0)
MCH: 32.9 pg (ref 27.2–33.4)
MCHC: 34.2 g/dL (ref 32.0–36.0)
MCV: 96.1 fL (ref 79.3–98.0)
MONO#: 0.3 10*3/uL (ref 0.1–0.9)
MONO%: 4.6 % (ref 0.0–14.0)
NEUT#: 5.2 10*3/uL (ref 1.5–6.5)
NEUT%: 85.5 % — ABNORMAL HIGH (ref 39.0–75.0)
Platelets: 163 10*3/uL (ref 140–400)
RBC: 3.59 10*6/uL — ABNORMAL LOW (ref 4.20–5.82)
RDW: 17.4 % — ABNORMAL HIGH (ref 11.0–14.6)
WBC: 6.1 10*3/uL (ref 4.0–10.3)
lymph#: 0.6 10*3/uL — ABNORMAL LOW (ref 0.9–3.3)

## 2014-09-08 LAB — COMPREHENSIVE METABOLIC PANEL (CC13)
ALT: 15 U/L (ref 0–55)
AST: 17 U/L (ref 5–34)
Albumin: 4.1 g/dL (ref 3.5–5.0)
Alkaline Phosphatase: 91 U/L (ref 40–150)
Anion Gap: 9 mEq/L (ref 3–11)
BUN: 20.8 mg/dL (ref 7.0–26.0)
CO2: 26 meq/L (ref 22–29)
CREATININE: 1 mg/dL (ref 0.7–1.3)
Calcium: 10 mg/dL (ref 8.4–10.4)
Chloride: 102 mEq/L (ref 98–109)
Glucose: 177 mg/dl — ABNORMAL HIGH (ref 70–140)
Potassium: 4.4 mEq/L (ref 3.5–5.1)
Sodium: 137 mEq/L (ref 136–145)
TOTAL PROTEIN: 7.3 g/dL (ref 6.4–8.3)
Total Bilirubin: 0.39 mg/dL (ref 0.20–1.20)

## 2014-09-08 MED ORDER — SODIUM CHLORIDE 0.9 % IV SOLN
Freq: Once | INTRAVENOUS | Status: AC
  Administered 2014-09-08: 12:00:00 via INTRAVENOUS

## 2014-09-08 MED ORDER — CYANOCOBALAMIN 1000 MCG/ML IJ SOLN
1000.0000 ug | Freq: Once | INTRAMUSCULAR | Status: AC
  Administered 2014-09-08: 1000 ug via INTRAMUSCULAR

## 2014-09-08 MED ORDER — CYANOCOBALAMIN 1000 MCG/ML IJ SOLN
INTRAMUSCULAR | Status: AC
  Filled 2014-09-08: qty 1

## 2014-09-08 MED ORDER — ONDANSETRON 16 MG/50ML IVPB (CHCC)
16.0000 mg | Freq: Once | INTRAVENOUS | Status: AC
Start: 2014-09-08 — End: 2014-09-08
  Administered 2014-09-08: 16 mg via INTRAVENOUS

## 2014-09-08 MED ORDER — DEXAMETHASONE SODIUM PHOSPHATE 20 MG/5ML IJ SOLN
INTRAMUSCULAR | Status: AC
  Filled 2014-09-08: qty 5

## 2014-09-08 MED ORDER — SODIUM CHLORIDE 0.9 % IV SOLN
595.5000 mg | Freq: Once | INTRAVENOUS | Status: AC
  Administered 2014-09-08: 600 mg via INTRAVENOUS
  Filled 2014-09-08: qty 60

## 2014-09-08 MED ORDER — DEXAMETHASONE SODIUM PHOSPHATE 20 MG/5ML IJ SOLN
20.0000 mg | Freq: Once | INTRAMUSCULAR | Status: AC
  Administered 2014-09-08: 20 mg via INTRAVENOUS

## 2014-09-08 MED ORDER — ONDANSETRON 16 MG/50ML IVPB (CHCC)
INTRAVENOUS | Status: AC
  Filled 2014-09-08: qty 16

## 2014-09-08 MED ORDER — SODIUM CHLORIDE 0.9 % IV SOLN
1100.0000 mg | Freq: Once | INTRAVENOUS | Status: AC
  Administered 2014-09-08: 1100 mg via INTRAVENOUS
  Filled 2014-09-08: qty 44

## 2014-09-08 NOTE — Patient Instructions (Signed)
[facility] Discharge Instructions for Patients Receiving Chemotherapy  Today you received the following chemotherapy agents: Alimta, Carboplatin.  You received your B12 injection and flu vaccination today as well.  To help prevent nausea and vomiting after your treatment, we encourage you to take your nausea medication as prescribed by your physician.    If you develop nausea and vomiting that is not controlled by your nausea medication, call the clinic.   BELOW ARE SYMPTOMS THAT SHOULD BE REPORTED IMMEDIATELY:  *FEVER GREATER THAN 100.5 F  *CHILLS WITH OR WITHOUT FEVER  NAUSEA AND VOMITING THAT IS NOT CONTROLLED WITH YOUR NAUSEA MEDICATION  *UNUSUAL SHORTNESS OF BREATH  *UNUSUAL BRUISING OR BLEEDING  TENDERNESS IN MOUTH AND THROAT WITH OR WITHOUT PRESENCE OF ULCERS  *URINARY PROBLEMS  *BOWEL PROBLEMS  UNUSUAL RASH Items with * indicate a potential emergency and should be followed up as soon as possible.  Feel free to call the clinic you have any questions or concerns. The clinic phone number is [phone].

## 2014-09-08 NOTE — Progress Notes (Addendum)
[facility] [phone]   Fax:(336) [place], MD [address] [place] [address]  DIAGNOSIS: Stage IIIB (T2b, N3, M0) non-small cell lung cancer, adenocarcinoma, diagnosed in June of 2015 with large left lower lobe lung mass in addition to ipsilateral and contralateral mediastinal lymphadenopathy.  PRIOR THERAPY: None  CURRENT THERAPY: Systemic chemotherapy with carboplatin for AUC of 5 and Alimta 500 mg/M2 every 3 weeks but status post 5 cycles.  INTERVAL HISTORY: Tyler [name] 73 y.o. male returns to the clinic today for followup visit accompanied by his sister. Overall is tolerating his systemic chemotherapy with carboplatin and Alimta relatively well particularly with the addition of Zofran. He complains of some fatigue. He reports that the dizziness he was experiencing resolved after his packed red blood cell transfusion. He only experienced one additional episode of shortness of breath. He notes some increase hair loss recently. He voiced no other specific complaints today.  He continues to have issues with sleeping but states that this is a long-standing issue. He reports only one episode of nausea and vomiting after last cycle of chemotherapy.  He denied having any significant chest pain, cough or hemoptysis. He has no weight loss or night sweats. The patient denied having any significant vomiting, no fever or chills. He is here today to start cycle #6 of chemotherapy.   MEDICAL HISTORY: Past Medical History  Diagnosis Date  . Allergic rhinitis   . BPH (benign prostatic hyperplasia)   . GERD (gastroesophageal reflux disease)   . COPD (chronic obstructive pulmonary disease)   . Shortness of breath   . Arthritis   . Enlarged prostate   . Hypertension     dr Margreta Journey little in [place]   pcp      dr Stanford Breed  cardiac md  . Mental disorder   . Constipation   . Cancer Lung  . Lung cancer 04/11/14    lul carina  lesion=Adenocarcinoma  . Chronic chest pain   . Pneumonia     hx    ALLERGIES:  is allergic to ketorolac tromethamine; codeine; morphine and related; tramadol; and tussionex pennkinetic er.  MEDICATIONS:  Current Outpatient Prescriptions  Medication Sig Dispense Refill  . albuterol (PROVENTIL HFA;VENTOLIN HFA) 108 (90 BASE) MCG/ACT inhaler Inhale 2 puffs into the lungs every 6 (six) hours as needed for wheezing or shortness of breath.    Marland Kitchen albuterol (PROVENTIL) (2.5 MG/3ML) 0.083% nebulizer solution Take 3 mLs (2.5 mg total) by nebulization every 6 (six) hours as needed for wheezing. 75 mL 3  . dexamethasone (DECADRON) 4 MG tablet Take 1 tablet by mouth twice a day, the day before, the day of and the day after chemotherapy. Take with food 30 tablet 1  . escitalopram (LEXAPRO) 20 MG tablet   12  . flurazepam (DALMANE) 30 MG capsule Take 30 mg by mouth at bedtime as needed for sleep.    . folic acid (FOLVITE) 1 MG tablet Take 1 tablet (1 mg total) by mouth daily. 30 tablet 3  . naproxen (NAPROSYN) 500 MG tablet Take 500 mg by mouth 2 (two) times daily with a meal.    . nebivolol (BYSTOLIC) 10 MG tablet Take 10 mg by mouth 2 (two) times daily. 1 tab bid    . omeprazole (PRILOSEC) 20 MG capsule Take 20 mg by mouth 2 (two) times daily before a meal.     . ondansetron (ZOFRAN) 8 MG tablet  Take 1 tablet (8 mg total) by mouth every 8 (eight) hours as needed for nausea or vomiting. 20 tablet 1  . oxyCODONE-acetaminophen (PERCOCET/ROXICET) 5-325 MG per tablet Take 1-2 tablets by mouth every 6 (six) hours as needed for severe pain. 60 tablet 0  . prochlorperazine (COMPAZINE) 10 MG tablet Take 1 tablet (10 mg total) by mouth every 6 (six) hours as needed for nausea or vomiting. 30 tablet 1  . Tamsulosin HCl (FLOMAX) 0.4 MG CAPS Take 0.4 mg by mouth every morning.      No current facility-administered medications for this visit.    SURGICAL HISTORY:  Past Surgical History  Procedure Laterality Date    . Ankle surgery  2005  . Prostate surgery  11/25/2010  . Hernia repair    . Mediastinoscopy  06/11/2012    Procedure: MEDIASTINOSCOPY;  Surgeon: Grace Isaac, MD;  Location: [facility];  Service: Thoracic;  Laterality: N/A;  . Inguinal hernia repair      left  . Eye surgery      cataract right  . Video bronchoscopy with endobronchial ultrasound  10/08/2012    Procedure: VIDEO BRONCHOSCOPY WITH ENDOBRONCHIAL ULTRASOUND;  Surgeon: Collene Gobble, MD;  Location: [facility];  Service: Pulmonary;  Laterality: N/A;  . Video bronchoscopy Bilateral 04/11/2014    Procedure: VIDEO BRONCHOSCOPY WITHOUT FLUORO;  Surgeon: Collene Gobble, MD;  Location: [facility];  Service: Cardiopulmonary;  Laterality: Bilateral;    REVIEW OF SYSTEMS:  Constitutional: positive for fatigue and hair loss Eyes: negative Ears, nose, mouth, throat, and face: negative Respiratory: positive for dyspnea on exertion Cardiovascular: negative Gastrointestinal: negative Genitourinary:negative Integument/breast: negative Hematologic/lymphatic: negative Musculoskeletal:negative Neurological: negative Behavioral/Psych: negative Endocrine: negative Allergic/Immunologic: negative   PHYSICAL EXAMINATION: General appearance: alert, cooperative and no distress Head: Normocephalic, without obvious abnormality, atraumatic Neck: no adenopathy, no JVD, supple, symmetrical, trachea midline and thyroid not enlarged, symmetric, no tenderness/mass/nodules Lymph nodes: Cervical, supraclavicular, and axillary nodes normal. Resp: wheezes bilaterally Back: symmetric, no curvature. ROM normal. No CVA tenderness. Cardio: regular rate and rhythm, S1, S2 normal, no murmur, click, rub or gallop GI: soft, non-tender; bowel sounds normal; no masses,  no organomegaly Extremities: extremities normal, atraumatic, no cyanosis or edema Neurologic: Alert and oriented X 3, normal strength and tone. Normal symmetric reflexes. Normal coordination and  gait  ECOG PERFORMANCE STATUS: 1 - Symptomatic but completely ambulatory  Blood pressure 140/67, pulse 62, temperature 98 F (36.7 C), temperature source Oral, resp. rate 17, height 6\' 2"  (1.88 m), weight 221 lb 6.4 oz (100.426 kg), SpO2 96 %.  LABORATORY DATA: Lab Results  Component Value Date   WBC 6.1 09/08/2014   HGB 11.8* 09/08/2014   HCT 34.5* 09/08/2014   MCV 96.1 09/08/2014   PLT 163 09/08/2014      Chemistry      Component Value Date/Time   NA 137 09/08/2014 0959   NA 139 11/08/2013 1524   K 4.4 09/08/2014 0959   K 4.1 11/08/2013 1524   CL 101 11/08/2013 1524   CO2 26 09/08/2014 0959   CO2 30 11/08/2013 1524   BUN 20.8 09/08/2014 0959   BUN 13 11/08/2013 1524   CREATININE 1.0 09/08/2014 0959   CREATININE 0.8 11/08/2013 1524      Component Value Date/Time   CALCIUM 10.0 09/08/2014 0959   CALCIUM 9.5 11/08/2013 1524   ALKPHOS 91 09/08/2014 0959   ALKPHOS 118* 11/08/2013 1524   AST 17 09/08/2014 0959   AST 20 11/08/2013 1524   ALT  15 09/08/2014 0959   ALT 19 11/08/2013 1524   BILITOT 0.39 09/08/2014 0959   BILITOT 0.6 11/08/2013 1524       RADIOGRAPHIC STUDIES: Ct Chest W Contrast  07/28/2014   CLINICAL DATA:  Followup lung cancer  EXAM: CT CHEST WITH CONTRAST  TECHNIQUE: Multidetector CT imaging of the chest was performed during intravenous contrast administration.  CONTRAST:  2mL OMNIPAQUE IOHEXOL 300 MG/ML  SOLN  COMPARISON:  03/25/2014 and PET-CT from 04/24/2014  FINDINGS: Mediastinum: The heart size is normal. No pericardial effusion. Calcified atherosclerotic disease involves the thoracic aorta as well as the LAD coronary artery. Trachea appears patent and is midline. There is a large hiatal hernia. AP window lymph node measures 1.1 cm, image 23/series 2. Previously 1.6 cm. Right paratracheal lymph node measures 0.8 cm, image 16/ series 2. Previously 0.7 cm 6 mm right paratracheal lymph node is noted, image 22/ series 2. Previously 0.8 cm  Lungs/Pleura:  Moderate changes of centrilobular emphysema noted. Pleural thickening and loculated pleural overlies the posterior left midlung. Changes of external beam radiation are identified within the superior segment of the left lower lobe. Cavitary lesion within the superior segment of left lower lobe measures 1.8 x 1.6 cm. Previously this measured 3.7 x 4.2 cm.  Upper Abdomen: The visualized portions of the liver appear normal. The adrenal glands are both normal. The visualized portions of the spleen and pancreas are normal.  Musculoskeletal: Review of the visualized osseous structures is negative for aggressive or lytic or sclerotic bone lesion.  IMPRESSION: 1. Interval response to therapy. The cavitary lesion within the left lung has decreased in size from previous exam. 2. Interval decrease in size of AP window adenopathy. 3. Atherosclerotic disease 4. Emphysema.   Electronically Signed   By: Kerby Moors M.D.   On: 07/28/2014 16:20   ASSESSMENT AND PLAN: This is a very pleasant 73 years old white male recently diagnosed with a stage IIIB non-small cell lung cancer, adenocarcinoma with large left lower lobe lung mass in addition to ipsilateral and contralateral mediastinal lymphadenopathy. He was not a candidate for concurrent radiotherapy because of the large volume of treatment area.  He is currently undergoing systemic chemotherapy with carboplatin and Alimta status post 5 cycles and tolerated his treatment fairly well.  His nausea is better controlled with the addition of Zofran.  Patient was discussed with and also seen by Dr. Julien Nordmann. His last CT scan revealed interval response to therapy.  He will proceed to cycle #6 today as scheduled. He'll continue with weekly labs as scheduled. He'll return in 3 weeks with a restaging CT scan of the chest with contrast to reevaluate his disease.f Pending the outcome of his next restaging CT scan, the patient would be interested in a break chemotherapy.   He was  advised to call immediately if he has any concerning symptoms in the interval.  The patient voices understanding of current disease status and treatment options and is in agreement with the current care plan.  All questions were answered. The patient knows to call the clinic with any problems, questions or concerns. We can certainly see the patient much sooner if necessary.    Disclaimer: This note was dictated with voice recognition software. Similar sounding words can inadvertently be transcribed and may be missed upon review. Carlton Adam, PA-C 09/08/2014  ADDENDUM: Hematology/Oncology Attending:  I had a face to face encounter with the patient. I recommended his care plan. This is a very pleasant 73 years  old white male with stage IIIB non-small cell lung cancer currently undergoing systemic chemotherapy with carboplatin and Alimta status post 5 cycles. The patient is tolerating his treatment fairly well with no significant adverse effects. I recommended for him to continue his current treatment with the same regimen. He will receive cycle #6 today. He would come back for follow-up visit in 3 weeks after repeating CT scan of the chest, abdomen and pelvis for restaging of his disease. He was advised to call immediately if he has any concerning symptoms in the interval.  Disclaimer: This note was dictated with voice recognition software. Similar sounding words can inadvertently be transcribed and may be missed upon review. Eilleen Kempf., MD 09/13/2014

## 2014-09-10 NOTE — Patient Instructions (Signed)
Continue labs and chemotherapy as scheduled Follow-up in 3 weeks with a restaging CT scan of your chest to reevaluate your disease

## 2014-09-16 ENCOUNTER — Telehealth: Payer: Self-pay | Admitting: Internal Medicine

## 2014-09-16 ENCOUNTER — Other Ambulatory Visit: Payer: Self-pay | Admitting: Physician Assistant

## 2014-09-16 DIAGNOSIS — C3432 Malignant neoplasm of lower lobe, left bronchus or lung: Secondary | ICD-10-CM

## 2014-09-16 NOTE — Telephone Encounter (Signed)
s.w. pt and advised on NOV appts...done...pt ok adn aware

## 2014-09-19 ENCOUNTER — Other Ambulatory Visit (HOSPITAL_BASED_OUTPATIENT_CLINIC_OR_DEPARTMENT_OTHER): Payer: Medicare Other

## 2014-09-19 DIAGNOSIS — C3490 Malignant neoplasm of unspecified part of unspecified bronchus or lung: Secondary | ICD-10-CM

## 2014-09-19 DIAGNOSIS — C778 Secondary and unspecified malignant neoplasm of lymph nodes of multiple regions: Secondary | ICD-10-CM

## 2014-09-19 DIAGNOSIS — C3432 Malignant neoplasm of lower lobe, left bronchus or lung: Secondary | ICD-10-CM

## 2014-09-19 LAB — CBC WITH DIFFERENTIAL/PLATELET
BASO%: 0.2 % (ref 0.0–2.0)
BASOS ABS: 0 10*3/uL (ref 0.0–0.1)
EOS%: 1.5 % (ref 0.0–7.0)
Eosinophils Absolute: 0.1 10*3/uL (ref 0.0–0.5)
HEMATOCRIT: 29.3 % — AB (ref 38.4–49.9)
HEMOGLOBIN: 10.2 g/dL — AB (ref 13.0–17.1)
LYMPH%: 46 % (ref 14.0–49.0)
MCH: 33.9 pg — AB (ref 27.2–33.4)
MCHC: 34.8 g/dL (ref 32.0–36.0)
MCV: 97.3 fL (ref 79.3–98.0)
MONO#: 0.9 10*3/uL (ref 0.1–0.9)
MONO%: 23.1 % — AB (ref 0.0–14.0)
NEUT#: 1.2 10*3/uL — ABNORMAL LOW (ref 1.5–6.5)
NEUT%: 29.2 % — AB (ref 39.0–75.0)
Platelets: 61 10*3/uL — ABNORMAL LOW (ref 140–400)
RBC: 3.01 10*6/uL — ABNORMAL LOW (ref 4.20–5.82)
RDW: 16.5 % — ABNORMAL HIGH (ref 11.0–14.6)
WBC: 4 10*3/uL (ref 4.0–10.3)
lymph#: 1.9 10*3/uL (ref 0.9–3.3)

## 2014-09-19 LAB — COMPREHENSIVE METABOLIC PANEL (CC13)
ALK PHOS: 119 U/L (ref 40–150)
ALT: 24 U/L (ref 0–55)
AST: 24 U/L (ref 5–34)
Albumin: 3.8 g/dL (ref 3.5–5.0)
Anion Gap: 8 mEq/L (ref 3–11)
BILIRUBIN TOTAL: 0.46 mg/dL (ref 0.20–1.20)
BUN: 29.1 mg/dL — AB (ref 7.0–26.0)
CALCIUM: 9.2 mg/dL (ref 8.4–10.4)
CO2: 26 mEq/L (ref 22–29)
Chloride: 104 mEq/L (ref 98–109)
Creatinine: 1.2 mg/dL (ref 0.7–1.3)
Glucose: 98 mg/dl (ref 70–140)
Potassium: 4.6 mEq/L (ref 3.5–5.1)
Sodium: 138 mEq/L (ref 136–145)
Total Protein: 6.8 g/dL (ref 6.4–8.3)

## 2014-09-24 ENCOUNTER — Other Ambulatory Visit: Payer: Self-pay | Admitting: *Deleted

## 2014-09-24 DIAGNOSIS — R918 Other nonspecific abnormal finding of lung field: Secondary | ICD-10-CM

## 2014-09-26 ENCOUNTER — Encounter (HOSPITAL_COMMUNITY): Payer: Self-pay

## 2014-09-26 ENCOUNTER — Ambulatory Visit (HOSPITAL_COMMUNITY)
Admission: RE | Admit: 2014-09-26 | Discharge: 2014-09-26 | Disposition: A | Discharge status: Home / Self Care | Payer: Medicare Other | Source: Ambulatory Visit | Attending: Physician Assistant | Admitting: Physician Assistant

## 2014-09-26 ENCOUNTER — Other Ambulatory Visit (HOSPITAL_BASED_OUTPATIENT_CLINIC_OR_DEPARTMENT_OTHER): Payer: Medicare Other

## 2014-09-26 DIAGNOSIS — C3432 Malignant neoplasm of lower lobe, left bronchus or lung: Secondary | ICD-10-CM

## 2014-09-26 DIAGNOSIS — R918 Other nonspecific abnormal finding of lung field: Secondary | ICD-10-CM

## 2014-09-26 DIAGNOSIS — Z79899 Other long term (current) drug therapy: Secondary | ICD-10-CM | POA: Insufficient documentation

## 2014-09-26 DIAGNOSIS — R0602 Shortness of breath: Secondary | ICD-10-CM | POA: Diagnosis present

## 2014-09-26 DIAGNOSIS — J90 Pleural effusion, not elsewhere classified: Secondary | ICD-10-CM | POA: Diagnosis not present

## 2014-09-26 DIAGNOSIS — C3490 Malignant neoplasm of unspecified part of unspecified bronchus or lung: Secondary | ICD-10-CM | POA: Diagnosis not present

## 2014-09-26 LAB — CBC WITH DIFFERENTIAL/PLATELET
BASO%: 0.5 % (ref 0.0–2.0)
BASOS ABS: 0 10*3/uL (ref 0.0–0.1)
EOS ABS: 0.2 10*3/uL (ref 0.0–0.5)
EOS%: 3.5 % (ref 0.0–7.0)
HCT: 29.6 % — ABNORMAL LOW (ref 38.4–49.9)
HGB: 10 g/dL — ABNORMAL LOW (ref 13.0–17.1)
LYMPH%: 40.4 % (ref 14.0–49.0)
MCH: 33.8 pg — ABNORMAL HIGH (ref 27.2–33.4)
MCHC: 33.7 g/dL (ref 32.0–36.0)
MCV: 100.3 fL — ABNORMAL HIGH (ref 79.3–98.0)
MONO#: 0.6 10*3/uL (ref 0.1–0.9)
MONO%: 11.5 % (ref 0.0–14.0)
NEUT%: 44.1 % (ref 39.0–75.0)
NEUTROS ABS: 2.1 10*3/uL (ref 1.5–6.5)
PLATELETS: 135 10*3/uL — AB (ref 140–400)
RBC: 2.96 10*6/uL — ABNORMAL LOW (ref 4.20–5.82)
RDW: 19.6 % — AB (ref 11.0–14.6)
WBC: 4.9 10*3/uL (ref 4.0–10.3)
lymph#: 2 10*3/uL (ref 0.9–3.3)

## 2014-09-26 LAB — COMPREHENSIVE METABOLIC PANEL (CC13)
ALBUMIN: 3.9 g/dL (ref 3.5–5.0)
ALK PHOS: 102 U/L (ref 40–150)
ALT: 14 U/L (ref 0–55)
AST: 19 U/L (ref 5–34)
Anion Gap: 11 mEq/L (ref 3–11)
BUN: 23 mg/dL (ref 7.0–26.0)
CALCIUM: 9.5 mg/dL (ref 8.4–10.4)
CHLORIDE: 105 meq/L (ref 98–109)
CO2: 27 mEq/L (ref 22–29)
Creatinine: 1.3 mg/dL (ref 0.7–1.3)
GLUCOSE: 112 mg/dL (ref 70–140)
POTASSIUM: 4.6 meq/L (ref 3.5–5.1)
Sodium: 143 mEq/L (ref 136–145)
Total Bilirubin: 0.3 mg/dL (ref 0.20–1.20)
Total Protein: 6.7 g/dL (ref 6.4–8.3)

## 2014-09-26 MED ORDER — IOHEXOL 300 MG/ML  SOLN
80.0000 mL | Freq: Once | INTRAMUSCULAR | Status: AC | PRN
  Administered 2014-09-26: 80 mL via INTRAVENOUS

## 2014-09-29 ENCOUNTER — Ambulatory Visit (HOSPITAL_BASED_OUTPATIENT_CLINIC_OR_DEPARTMENT_OTHER): Payer: Medicare Other | Admitting: Internal Medicine

## 2014-09-29 ENCOUNTER — Encounter: Payer: Self-pay | Admitting: Internal Medicine

## 2014-09-29 VITALS — BP 128/60 | HR 62 | Temp 97.6°F | Resp 18 | Ht 74.0 in | Wt 226.4 lb

## 2014-09-29 DIAGNOSIS — C3402 Malignant neoplasm of left main bronchus: Secondary | ICD-10-CM | POA: Insufficient documentation

## 2014-09-29 DIAGNOSIS — C3432 Malignant neoplasm of lower lobe, left bronchus or lung: Secondary | ICD-10-CM

## 2014-09-29 DIAGNOSIS — C3430 Malignant neoplasm of lower lobe, unspecified bronchus or lung: Secondary | ICD-10-CM

## 2014-09-29 MED ORDER — PROCHLORPERAZINE MALEATE 10 MG PO TABS: 10.0000 mg | ORAL_TABLET | Freq: Four times a day (QID) | ORAL | Status: DC | PRN

## 2014-09-29 MED ORDER — ONDANSETRON HCL 8 MG PO TABS: 8.0000 mg | ORAL_TABLET | Freq: Three times a day (TID) | ORAL | Status: DC | PRN

## 2014-09-29 MED ORDER — OXYCODONE-ACETAMINOPHEN 5-325 MG PO TABS: 1.0000 | ORAL_TABLET | Freq: Four times a day (QID) | ORAL | Status: DC | PRN

## 2014-09-29 NOTE — Progress Notes (Signed)
[facility] [phone]   Fax:(336) [place], MD [address] [place] [address]  DIAGNOSIS: Stage IIIB (T2b, N3, M0) non-small cell lung cancer, adenocarcinoma, diagnosed in June of 2015 with large left lower lobe lung mass in addition to ipsilateral and contralateral mediastinal lymphadenopathy.  PRIOR THERAPY: Systemic chemotherapy with carboplatin for AUC of 5 and Alimta 500 mg/M2 every 3 weeks but status post 6 cycles. Last dose was given 09/08/2014 with partial response.  CURRENT THERAPY: Maintenance chemotherapy with single agent Alimta 500 MG/M2 every 3 weeks. First dose 10/27/2014.  INTERVAL HISTORY: Tyler [name] 73 y.o. male returns to the clinic today for followup visit accompanied by his wife. He tolerated the last cycle of his systemic chemotherapy with carboplatin and Alimta fairly well except for mild fatigue. He continues to have baseline shortness of breath and currently on home oxygen. He denied having any significant chest pain, cough or hemoptysis. He has no weight loss or night sweats. The patient denied having any significant vomiting, no fever or chills. He had repeat CT scan of the chest performed recently and he is here for evaluation and discussion of his scan results.  MEDICAL HISTORY: Past Medical History  Diagnosis Date  . Allergic rhinitis   . BPH (benign prostatic hyperplasia)   . GERD (gastroesophageal reflux disease)   . COPD (chronic obstructive pulmonary disease)   . Shortness of breath   . Arthritis   . Enlarged prostate   . Hypertension     dr Margreta Journey little in [place]   pcp      dr Stanford Breed  cardiac md  . Mental disorder   . Constipation   . Chronic chest pain   . Pneumonia     hx  . Cancer Lung  . Lung cancer 04/11/14    lul carina lesion=Adenocarcinoma    ALLERGIES:  is allergic to ketorolac tromethamine; codeine; morphine and related; tramadol; and tussionex  pennkinetic er.  MEDICATIONS:  Current Outpatient Prescriptions  Medication Sig Dispense Refill  . albuterol (PROVENTIL HFA;VENTOLIN HFA) 108 (90 BASE) MCG/ACT inhaler Inhale 2 puffs into the lungs every 6 (six) hours as needed for wheezing or shortness of breath.    Marland Kitchen albuterol (PROVENTIL) (2.5 MG/3ML) 0.083% nebulizer solution Take 3 mLs (2.5 mg total) by nebulization every 6 (six) hours as needed for wheezing. 75 mL 3  . dexamethasone (DECADRON) 4 MG tablet Take 1 tablet by mouth twice a day, the day before, the day of and the day after chemotherapy. Take with food 30 tablet 1  . escitalopram (LEXAPRO) 20 MG tablet   12  . flurazepam (DALMANE) 30 MG capsule Take 30 mg by mouth at bedtime as needed for sleep.    . folic acid (FOLVITE) 1 MG tablet TAKE 1 TABLET (1 MG TOTAL) BY MOUTH DAILY. 30 tablet 1  . naproxen (NAPROSYN) 500 MG tablet Take 500 mg by mouth 2 (two) times daily with a meal.    . nebivolol (BYSTOLIC) 10 MG tablet Take 10 mg by mouth 2 (two) times daily. 1 tab bid    . omeprazole (PRILOSEC) 20 MG capsule Take 20 mg by mouth 2 (two) times daily before a meal.     . ondansetron (ZOFRAN) 8 MG tablet Take 1 tablet (8 mg total) by mouth every 8 (eight) hours as needed for nausea or vomiting. 20 tablet 1  . oxyCODONE-acetaminophen (PERCOCET/ROXICET) 5-325 MG per tablet Take  1-2 tablets by mouth every 6 (six) hours as needed for severe pain. 60 tablet 0  . prochlorperazine (COMPAZINE) 10 MG tablet Take 1 tablet (10 mg total) by mouth every 6 (six) hours as needed for nausea or vomiting. 30 tablet 1  . Tamsulosin HCl (FLOMAX) 0.4 MG CAPS Take 0.4 mg by mouth every morning.      No current facility-administered medications for this visit.    SURGICAL HISTORY:  Past Surgical History  Procedure Laterality Date  . Ankle surgery  2005  . Prostate surgery  11/25/2010  . Hernia repair    . Mediastinoscopy  06/11/2012    Procedure: MEDIASTINOSCOPY;  Surgeon: Grace Isaac, MD;   Location: [facility];  Service: Thoracic;  Laterality: N/A;  . Inguinal hernia repair      left  . Eye surgery      cataract right  . Video bronchoscopy with endobronchial ultrasound  10/08/2012    Procedure: VIDEO BRONCHOSCOPY WITH ENDOBRONCHIAL ULTRASOUND;  Surgeon: Collene Gobble, MD;  Location: [facility];  Service: Pulmonary;  Laterality: N/A;  . Video bronchoscopy Bilateral 04/11/2014    Procedure: VIDEO BRONCHOSCOPY WITHOUT FLUORO;  Surgeon: Collene Gobble, MD;  Location: [facility];  Service: Cardiopulmonary;  Laterality: Bilateral;    REVIEW OF SYSTEMS:  Constitutional: positive for fatigue Eyes: negative Ears, nose, mouth, throat, and face: negative Respiratory: positive for dyspnea on exertion Cardiovascular: negative Gastrointestinal: negative Genitourinary:negative Integument/breast: negative Hematologic/lymphatic: negative Musculoskeletal:negative Neurological: negative Behavioral/Psych: negative Endocrine: negative Allergic/Immunologic: negative   PHYSICAL EXAMINATION: General appearance: alert, cooperative and no distress Head: Normocephalic, without obvious abnormality, atraumatic Neck: no adenopathy, no JVD, supple, symmetrical, trachea midline and thyroid not enlarged, symmetric, no tenderness/mass/nodules Lymph nodes: Cervical, supraclavicular, and axillary nodes normal. Resp: wheezes bilaterally Back: symmetric, no curvature. ROM normal. No CVA tenderness. Cardio: regular rate and rhythm, S1, S2 normal, no murmur, click, rub or gallop GI: soft, non-tender; bowel sounds normal; no masses,  no organomegaly Extremities: extremities normal, atraumatic, no cyanosis or edema Neurologic: Alert and oriented X 3, normal strength and tone. Normal symmetric reflexes. Normal coordination and gait  ECOG PERFORMANCE STATUS: 1 - Symptomatic but completely ambulatory  Blood pressure 128/60, pulse 62, temperature 97.6 F (36.4 C), temperature source Oral, resp. rate 18, height 6\' 2"   (1.88 m), weight 226 lb 6.4 oz (102.694 kg), SpO2 96 %.  LABORATORY DATA: Lab Results  Component Value Date   WBC 4.9 09/26/2014   HGB 10.0* 09/26/2014   HCT 29.6* 09/26/2014   MCV 100.3* 09/26/2014   PLT 135* 09/26/2014      Chemistry      Component Value Date/Time   NA 143 09/26/2014 1354   NA 139 11/08/2013 1524   K 4.6 09/26/2014 1354   K 4.1 11/08/2013 1524   CL 101 11/08/2013 1524   CO2 27 09/26/2014 1354   CO2 30 11/08/2013 1524   BUN 23.0 09/26/2014 1354   BUN 13 11/08/2013 1524   CREATININE 1.3 09/26/2014 1354   CREATININE 0.8 11/08/2013 1524      Component Value Date/Time   CALCIUM 9.5 09/26/2014 1354   CALCIUM 9.5 11/08/2013 1524   ALKPHOS 102 09/26/2014 1354   ALKPHOS 118* 11/08/2013 1524   AST 19 09/26/2014 1354   AST 20 11/08/2013 1524   ALT 14 09/26/2014 1354   ALT 19 11/08/2013 1524   BILITOT 0.30 09/26/2014 1354   BILITOT 0.6 11/08/2013 1524       RADIOGRAPHIC STUDIES: Ct Chest W Contrast  09/26/2014   CLINICAL DATA:  Follow-up left lower lobe of small cell lung cancer, chemotherapy ongoing, shortness of breath  EXAM: CT CHEST WITH CONTRAST  TECHNIQUE: Multidetector CT imaging of the chest was performed during intravenous contrast administration.  CONTRAST:  34mL OMNIPAQUE IOHEXOL 300 MG/ML  SOLN  COMPARISON:  PET-CT dated 04/24/2014.  CT chest dated 03/25/2014.  FINDINGS: Residual 2.2 x 2.1 cm spiculated left lower lobe nodule (series 5/ image 34), previously a 3.7 x 4.2 cm cavitary mass, corresponding to the known primary bronchogenic neoplasm. Associated pleural tail (series 5/ image 36). Volume loss in the left lower lobe with mild bronchiectasis (series 5/image 46). Underlying increased interstitial markings in the left lower lobe persist (series 5/ image 50), although the frank pulmonary nodularity suspicious for lymphangitic carcinomatosis has resolved. Trace left pleural effusion.  Lungs otherwise clear. Mild centrilobular emphysematous changes.  Mild biapical pleural parenchymal scarring. No pneumothorax. Visualized thyroid is unremarkable.  The heart is normal in size. No pericardial effusion. Coronary atherosclerosis. Atherosclerotic calcifications of the aortic arch.  Small mediastinal nodes, including a 6 mm right paratracheal node, a 10 mm short axis AP window, and a 5 mm short axis subcarinal node.  Moderate hiatal hernia.  Visualized upper abdomen is notable for vascular calcifications.  Degenerative changes of the visualized thoracolumbar spine.  IMPRESSION: Residual 2.2 cm spiculated left lower lobe nodule, decreased. Associated trace left pleural effusion.  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  Mild mediastinal lymph nodes are improved.   Electronically Signed   By: Julian Hy M.D.   On: 09/26/2014 15:07   ASSESSMENT AND PLAN: This is a very pleasant 73 years old white male recently diagnosed with a stage IIIB non-small cell lung cancer, adenocarcinoma with large left lower lobe lung mass in addition to ipsilateral and contralateral mediastinal lymphadenopathy. He was not a candidate for concurrent radiotherapy because of the large volume of treatment area.  The patient underwent a course of systemic chemotherapy with carboplatin and Alimta status post 6 cycles. He tolerated his treatment fairly well and the recent CT scan of the chest showed further improvement in his disease. For pain management he will continue on Percocet on as-needed basis. The patient would come back for followup visit in 3 weeks for evaluation after repeating CT scan of the chest for reevaluation of his disease. I discussed the scan results with the patient and his wife. I gave him the option of continuous observation and close monitoring versus consideration of maintenance chemotherapy with single agent Alimta. The patient is interested in the maintenance therapy but he would like to delay the start of this  treatment until after Christmas. I will arrange for him to start the first cycle of this treatment on 10/27/2014. He would come back for follow-up visit at that time. He was advised to call immediately if he has any concerning symptoms in the interval.  The patient voices understanding of current disease status and treatment options and is in agreement with the current care plan.  All questions were answered. The patient knows to call the clinic with any problems, questions or concerns. We can certainly see the patient much sooner if necessary.  Disclaimer: This note was dictated with voice recognition software. Similar sounding words can inadvertently be transcribed and may be missed upon review.

## 2014-09-30 ENCOUNTER — Telehealth: Payer: Self-pay | Admitting: Internal Medicine

## 2014-09-30 NOTE — Telephone Encounter (Signed)
s.w. pt and advised on Dec and Jan 2016 appt...ok and aware

## 2014-10-13 ENCOUNTER — Telehealth: Payer: Self-pay | Admitting: *Deleted

## 2014-10-13 NOTE — Telephone Encounter (Signed)
Received all from Dr Jeneen Rinks Little's office.  Pt has a narcotic contract with Dr. Rex Kras so he will only receive narcotics from Dr Eddie Dibbles office.  Informed Dr Vista Mink.  Called and informed Bobby at Dr Eddie Dibbles office that we will make a note in his chart.

## 2014-10-19 ENCOUNTER — Emergency Department (HOSPITAL_COMMUNITY): Payer: Medicare Other

## 2014-10-19 ENCOUNTER — Encounter (HOSPITAL_COMMUNITY): Payer: Self-pay | Admitting: Emergency Medicine

## 2014-10-19 ENCOUNTER — Emergency Department (HOSPITAL_COMMUNITY)
Admission: EM | Admit: 2014-10-19 | Discharge: 2014-10-19 | Disposition: A | Discharge status: Home / Self Care | Payer: Medicare Other | Attending: Emergency Medicine | Admitting: Emergency Medicine

## 2014-10-19 DIAGNOSIS — Z87891 Personal history of nicotine dependence: Secondary | ICD-10-CM | POA: Insufficient documentation

## 2014-10-19 DIAGNOSIS — K219 Gastro-esophageal reflux disease without esophagitis: Secondary | ICD-10-CM | POA: Insufficient documentation

## 2014-10-19 DIAGNOSIS — M1990 Unspecified osteoarthritis, unspecified site: Secondary | ICD-10-CM | POA: Insufficient documentation

## 2014-10-19 DIAGNOSIS — I10 Essential (primary) hypertension: Secondary | ICD-10-CM | POA: Diagnosis not present

## 2014-10-19 DIAGNOSIS — Z8709 Personal history of other diseases of the respiratory system: Secondary | ICD-10-CM

## 2014-10-19 DIAGNOSIS — F99 Mental disorder, not otherwise specified: Secondary | ICD-10-CM | POA: Insufficient documentation

## 2014-10-19 DIAGNOSIS — G8929 Other chronic pain: Secondary | ICD-10-CM | POA: Insufficient documentation

## 2014-10-19 DIAGNOSIS — Z8701 Personal history of pneumonia (recurrent): Secondary | ICD-10-CM | POA: Diagnosis not present

## 2014-10-19 DIAGNOSIS — Z791 Long term (current) use of non-steroidal anti-inflammatories (NSAID): Secondary | ICD-10-CM | POA: Diagnosis not present

## 2014-10-19 DIAGNOSIS — N400 Enlarged prostate without lower urinary tract symptoms: Secondary | ICD-10-CM | POA: Insufficient documentation

## 2014-10-19 DIAGNOSIS — R0602 Shortness of breath: Secondary | ICD-10-CM | POA: Diagnosis present

## 2014-10-19 DIAGNOSIS — Z79899 Other long term (current) drug therapy: Secondary | ICD-10-CM | POA: Diagnosis not present

## 2014-10-19 DIAGNOSIS — J441 Chronic obstructive pulmonary disease with (acute) exacerbation: Secondary | ICD-10-CM | POA: Diagnosis not present

## 2014-10-19 DIAGNOSIS — R0600 Dyspnea, unspecified: Secondary | ICD-10-CM

## 2014-10-19 DIAGNOSIS — Z85118 Personal history of other malignant neoplasm of bronchus and lung: Secondary | ICD-10-CM | POA: Diagnosis not present

## 2014-10-19 LAB — BASIC METABOLIC PANEL
Anion gap: 15 (ref 5–15)
BUN: 21 mg/dL (ref 6–23)
CO2: 25 mEq/L (ref 19–32)
Calcium: 9.8 mg/dL (ref 8.4–10.5)
Chloride: 98 mEq/L (ref 96–112)
Creatinine, Ser: 1.04 mg/dL (ref 0.50–1.35)
GFR calc non Af Amer: 70 mL/min — ABNORMAL LOW (ref >90)
GFR, EST AFRICAN AMERICAN: 81 mL/min — AB (ref >90)
Glucose, Bld: 120 mg/dL — ABNORMAL HIGH (ref 70–99)
POTASSIUM: 4.8 meq/L (ref 3.7–5.3)
SODIUM: 138 meq/L (ref 137–147)

## 2014-10-19 LAB — CBC WITH DIFFERENTIAL/PLATELET
Basophils Absolute: 0 10*3/uL (ref 0.0–0.1)
Basophils Relative: 0 % (ref 0–1)
Eosinophils Absolute: 0.1 10*3/uL (ref 0.0–0.7)
Eosinophils Relative: 1 % (ref 0–5)
HCT: 35.3 % — ABNORMAL LOW (ref 39.0–52.0)
Hemoglobin: 12 g/dL — ABNORMAL LOW (ref 13.0–17.0)
LYMPHS ABS: 1.3 10*3/uL (ref 0.7–4.0)
LYMPHS PCT: 18 % (ref 12–46)
MCH: 35.2 pg — ABNORMAL HIGH (ref 26.0–34.0)
MCHC: 34 g/dL (ref 30.0–36.0)
MCV: 103.5 fL — ABNORMAL HIGH (ref 78.0–100.0)
Monocytes Absolute: 0.6 10*3/uL (ref 0.1–1.0)
Monocytes Relative: 8 % (ref 3–12)
NEUTROS PCT: 73 % (ref 43–77)
Neutro Abs: 5.2 10*3/uL (ref 1.7–7.7)
PLATELETS: 149 10*3/uL — AB (ref 150–400)
RBC: 3.41 MIL/uL — AB (ref 4.22–5.81)
RDW: 16 % — ABNORMAL HIGH (ref 11.5–15.5)
WBC: 7.3 10*3/uL (ref 4.0–10.5)

## 2014-10-19 LAB — PRO B NATRIURETIC PEPTIDE: Pro B Natriuretic peptide (BNP): 128.3 pg/mL — ABNORMAL HIGH (ref 0–125)

## 2014-10-19 LAB — TROPONIN I

## 2014-10-19 MED ORDER — ONDANSETRON HCL 4 MG/2ML IJ SOLN
4.0000 mg | Freq: Once | INTRAMUSCULAR | Status: AC
  Administered 2014-10-19: 4 mg via INTRAVENOUS
  Filled 2014-10-19: qty 2

## 2014-10-19 MED ORDER — IOHEXOL 350 MG/ML SOLN
100.0000 mL | Freq: Once | INTRAVENOUS | Status: AC | PRN
  Administered 2014-10-19: 100 mL via INTRAVENOUS

## 2014-10-19 MED ORDER — AZITHROMYCIN 250 MG PO TABS: ORAL_TABLET | ORAL | Status: DC

## 2014-10-19 MED ORDER — IPRATROPIUM-ALBUTEROL 0.5-2.5 (3) MG/3ML IN SOLN
3.0000 mL | Freq: Once | RESPIRATORY_TRACT | Status: AC
  Administered 2014-10-19: 3 mL via RESPIRATORY_TRACT
  Filled 2014-10-19: qty 3

## 2014-10-19 NOTE — ED Notes (Signed)
He is in CT as I write this.

## 2014-10-19 NOTE — ED Notes (Signed)
Pt with Hx of stage 3 lung cancer, c/o SOB since yesterday, last treatment of chemo was 3 weeks ago, no radiation. Pt is diaphoretic, states he has felt feverish x few days.

## 2014-10-19 NOTE — Discharge Instructions (Signed)
If you were given medicines take as directed.  If you are on coumadin or contraceptives realize their levels and effectiveness is altered by many different medicines.  If you have any reaction (rash, tongues swelling, other) to the medicines stop taking and see a physician.   Please follow up as directed and return to the ER or see a physician for new or worsening symptoms.  Thank you. Filed Vitals:   10/19/14 0907 10/19/14 1021 10/19/14 1138 10/19/14 1213  BP:  152/69  181/83  Pulse:  56  87  Temp: 96.9 F (36.1 C)   98 F (36.7 C)  TempSrc: Rectal Oral  Oral  Resp:  15  18  SpO2:  99% 95% 98%

## 2014-10-19 NOTE — ED Notes (Signed)
Bed: TG28 Expected date:  Expected time:  Means of arrival:  Comments: Triage 1

## 2014-10-19 NOTE — ED Notes (Signed)
His marked shortness of breath (but never dyspneic) has nearly completely abated and he is comfortably visiting with his wife.

## 2014-10-19 NOTE — ED Provider Notes (Signed)
CSN: [id number]     Arrival date & time 10/19/14  8469 History   First MD Initiated Contact with Patient 10/19/14 [id number]     Chief Complaint  Patient presents with  . Shortness of Breath     (Consider location/radiation/quality/duration/timing/severity/associated sxs/prior Treatment) HPI Comments: 73 year old male with history of COPD on home oxygen, lung cancer with chemotherapy 3 weeks ago stage III, high blood pressure presents with worsening shortness of breath for the past 2 days. Patient felt feverish no documented fever, mild cough, symptoms fairly constant. No new leg swelling or calf pain, no recent surgeries, no blood clot history, no cardiac history. Past smoker.  The history is provided by the patient.    Past Medical History  Diagnosis Date  . Allergic rhinitis   . BPH (benign prostatic hyperplasia)   . GERD (gastroesophageal reflux disease)   . COPD (chronic obstructive pulmonary disease)   . Shortness of breath   . Arthritis   . Enlarged prostate   . Hypertension     dr Margreta Journey little in [place]   pcp      dr Stanford Breed  cardiac md  . Mental disorder   . Constipation   . Chronic chest pain   . Pneumonia     hx  . Cancer Lung  . 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 Ankle surgery  2005  . Prostate surgery  11/25/2010  . Hernia repair    . Mediastinoscopy  06/11/2012    Procedure: MEDIASTINOSCOPY;  Surgeon: Grace Isaac, MD;  Location: [facility];  Service: Thoracic;  Laterality: N/A;  . Inguinal hernia repair      left  . Eye surgery      cataract right  . Video bronchoscopy with endobronchial ultrasound  10/08/2012    Procedure: VIDEO BRONCHOSCOPY WITH ENDOBRONCHIAL ULTRASOUND;  Surgeon: Collene Gobble, MD;  Location: [facility];  Service: Pulmonary;  Laterality: N/A;  . Video bronchoscopy Bilateral 04/11/2014    Procedure: VIDEO BRONCHOSCOPY WITHOUT FLUORO;  Surgeon: Collene Gobble, MD;   Location: [facility];  Service: Cardiopulmonary;  Laterality: Bilateral;   Family History  Problem Relation Age of Onset  . Heart disease Mother   . Arthritis Sister   . Arthritis Sister   . Liver cancer Father    History  Substance Use Topics  . Smoking status: Former Smoker -- 2.00 packs/day for 50 years    Types: Cigarettes    Quit date: 12/26/2009  . Smokeless tobacco: Never Used  . Alcohol Use: 14.4 oz/week    24 Cans of beer per week     Comment: 4 beers every night    Review of Systems  Constitutional: Positive for fever. Negative for chills.  HENT: Negative for congestion.   Eyes: Negative for visual disturbance.  Respiratory: Positive for cough and shortness of breath.   Cardiovascular: Negative for chest pain.  Gastrointestinal: Negative for vomiting and abdominal pain.  Genitourinary: Negative for dysuria and flank pain.  Musculoskeletal: Negative for back pain, neck pain and neck stiffness.  Skin: Negative for rash.  Neurological: Negative for light-headedness and headaches.      Allergies  Ketorolac tromethamine; Codeine; Morphine and related; Tramadol; and Tussionex pennkinetic er  Home Medications   Prior to Admission medications   Medication Sig Start Date End Date Taking? Authorizing Provider  albuterol (PROVENTIL HFA;VENTOLIN HFA) 108 (90 BASE) MCG/ACT inhaler Inhale 2 puffs into the lungs  every 6 (six) hours as needed for wheezing or shortness of breath.   Yes Historical Provider, MD  albuterol (PROVENTIL) (2.5 MG/3ML) 0.083% nebulizer solution Take 3 mLs (2.5 mg total) by nebulization every 6 (six) hours as needed for wheezing. 04/14/14  Yes Collene Gobble, MD  dexamethasone (DECADRON) 4 MG tablet Take 1 tablet by mouth twice a day, the day before, the day of and the day after chemotherapy. Take with food 05/19/14  Yes Adrena E Johnson, PA-C  escitalopram (LEXAPRO) 20 MG tablet Take 20 mg by mouth daily.  08/16/14  Yes Historical Provider, MD   flurazepam (DALMANE) 30 MG capsule Take 30 mg by mouth at bedtime as needed for sleep.   Yes Historical Provider, MD  folic acid (FOLVITE) 1 MG tablet TAKE 1 TABLET (1 MG TOTAL) BY MOUTH DAILY. 09/16/14  Yes Adrena E Johnson, PA-C  naproxen (NAPROSYN) 500 MG tablet Take 500 mg by mouth 2 (two) times daily with a meal.   Yes Historical Provider, MD  nebivolol (BYSTOLIC) 10 MG tablet Take 10 mg by mouth 2 (two) times daily. 1 tab bid   Yes Historical Provider, MD  omeprazole (PRILOSEC) 20 MG capsule Take 20 mg by mouth 2 (two) times daily before a meal.    Yes Historical Provider, MD  ondansetron (ZOFRAN) 8 MG tablet Take 1 tablet (8 mg total) by mouth every 8 (eight) hours as needed for nausea or vomiting. 09/29/14  Yes Curt Bears, MD  oxyCODONE (ROXICODONE) 15 MG immediate release tablet Take 15 mg by mouth every 4 (four) hours as needed for pain.   Yes Historical Provider, MD  PRESCRIPTION MEDICATION Chem[facility] at [facility] (Dr. Julien Nordmann).  Last dose Carboplatin / Alimta was 09/08/14.  Planning to start single agent Alimta every 3 weeks beginning 10/27/14.   Yes Historical Provider, MD  prochlorperazine (COMPAZINE) 10 MG tablet Take 1 tablet (10 mg total) by mouth every 6 (six) hours as needed for nausea or vomiting. 09/29/14  Yes Curt Bears, MD  Tamsulosin HCl (FLOMAX) 0.4 MG CAPS Take 0.4 mg by mouth every morning.    Yes Historical Provider, MD  oxyCODONE-acetaminophen (PERCOCET/ROXICET) 5-325 MG per tablet Take 1-2 tablets by mouth every 6 (six) hours as needed for severe pain. Patient not taking: Reported on 10/19/2014 09/29/14   Curt Bears, MD   BP 181/83 mmHg  Pulse 87  Temp(Src) 98 F (36.7 C) (Oral)  Resp 18  SpO2 98% Physical Exam  Constitutional: He is oriented to person, place, and time. He appears well-developed and well-nourished.  HENT:  Head: Normocephalic and atraumatic.  Eyes: Right eye exhibits no discharge. Left eye exhibits no discharge.  Neck: Normal range  of motion. Neck supple. No tracheal deviation present.  Cardiovascular: Normal rate and regular rhythm.   Pulmonary/Chest: Breath sounds normal. Respiratory distress: mild increased effort.  Abdominal: Soft. He exhibits no distension. There is no tenderness. There is no guarding.  Musculoskeletal: He exhibits no edema.  Neurological: He is alert and oriented to person, place, and time.  Skin: Skin is warm. No rash noted.  Psychiatric: He has a normal mood and affect.  Nursing note and vitals reviewed.   ED Course  Procedures (including critical care time)   EMERGENCY DEPARTMENT [place] CARDIAC EXAM "Study: Limited Ultrasound of the heart and pericardium"  INDICATIONS:Dyspnea Multiple views of the heart and pericardium were obtained in real-time with a multi-frequency probe.  PERFORMED UJ:[id number]  IMAGES ARCHIVED?: Yes  FINDINGS: No pericardial effusion, Normal contractility and Tamponade  physiology absent  LIMITATIONS:  Body habitus  VIEWS USED: Parasternal long axis, Parasternal short axis and Apical 4 chamber   INTERPRETATION: Cardiac activity present, Pericardial effusioin absent, Cardiac tamponade absent and Normal contractility   Labs Review Labs Reviewed  BASIC METABOLIC PANEL - Abnormal; Notable for the following:    Glucose, Bld 120 (*)    GFR calc non Af Amer 70 (*)    GFR calc Af Amer 81 (*)    All other components within normal limits  CBC WITH DIFFERENTIAL - Abnormal; Notable for the following:    RBC 3.41 (*)    Hemoglobin 12.0 (*)    HCT 35.3 (*)    MCV 103.5 (*)    MCH 35.2 (*)    RDW 16.0 (*)    Platelets 149 (*)    All other components within normal limits  PRO B NATRIURETIC PEPTIDE - Abnormal; Notable for the following:    Pro B Natriuretic peptide (BNP) 128.3 (*)    All other components within normal limits  TROPONIN I    Imaging Review Dg Chest 2 View  10/19/2014   CLINICAL DATA:  Shortness of breath.  History of lung cancer.  COPD  EXAM: CHEST   2 VIEW  COMPARISON:  Chest radiograph 08/26/2014  FINDINGS: Exam limited secondary to motion artifact. Multiple monitoring leads overlie the patient. Stable enlarged cardiac and mediastinal contours. Persistent heterogeneous opacities left lung base. Small left pleural effusion. No definite pneumothorax. Regional skeleton is unremarkable.  IMPRESSION: Persistent heterogeneous opacities left lung base. Small left pleural effusion.   Electronically Signed   By: Lovey Newcomer M.D.   On: 10/19/2014 10:02   Ct Angio Chest Pe W/cm &/or Wo Cm  10/19/2014   CLINICAL DATA:  Diaphoresis.  Stage III lung carcinoma  EXAM: CT ANGIOGRAPHY CHEST WITH CONTRAST  TECHNIQUE: Multidetector CT imaging of the chest was performed using the standard protocol during bolus administration of intravenous contrast. Multiplanar CT image reconstructions and MIPs were obtained to evaluate the vascular anatomy.  CONTRAST:  137mL OMNIPAQUE IOHEXOL 350 MG/ML SOLN  COMPARISON:  Chest CT September 26, 2014 and chest radiograph October 19, 2014  FINDINGS: There is no demonstrable pulmonary embolus. Several lower lobe pulmonary arteries are encased by tumor and consolidation/fibrosis. There is atherosclerotic change in the aorta but no appreciable thoracic aortic aneurysm or dissection.  There is a degree of underlying centrilobular type emphysematous change. There is a small left effusion. The spiculated lesion in the left lower lobe noted previously is again noted measuring 2.3 x 1.9 cm, not felt to be significantly changed. There is surrounding consolidation and likely fibrosis. There is patchy atelectasis in this area as well, stable.  There is lower lobe bronchiectatic change bilaterally which is stable. These changes are more severe in the area of the mass and fibrosis in the left lower lobe than on the right, although these changes are present bilaterally.  There is no new lung opacity.  There are multiple small mediastinal lymph nodes,, stable.  There is no frank adenopathy by size criteria currently.  Pericardium is not thickened. There is a moderate hiatal type hernia.  In the visualized upper abdomen, there are a few small hepatic granulomas, calcified.  There are no blastic or lytic bone lesions. Thyroid appears unremarkable.  Review of the MIP images confirms the above findings.  IMPRESSION: No demonstrable pulmonary embolus.  Stable mass with surrounding atelectasis and fibrosis. Left effusion, stable. Underlying emphysema with bilateral lower lobe bronchiectatic change,  stable. Multiple small mediastinal lymph nodes but no frank adenopathy by size criteria. No new parenchymal lung lesion.  Hiatal hernia again noted.   Electronically Signed   By: Lowella Grip M.D.   On: 10/19/2014 11:48     EKG Interpretation None     EKG reviewed heart rate 63 sinus, no acute ST elevation, poor baseline, normal QT. MDM   Final diagnoses:  Dyspnea  History of COPD   Patient with worsening dyspnea and subjective fever. Discussed differential diagnosis including pneumonia, pulmonary embolism, cardiac related pericardial effusion, worsening cancer, COPD, other. Patient's lungs fairly clear which leans against COPD, plan for bedside ultrasound and chest x-ray with cardiac blood work.  Chest x-ray reviewed no new infiltrate. CT chest ordered to look for pulmonary embolus. CT results reviewed no acute pulmonary embolism, stable lung mass. Patient given a breathing treatment in the ER. On recheck patient feels improved no distress on his home oxygen. No no indication for admission at this time, patient comfortable with close outpatient follow-up.  Results and differential diagnosis were discussed with the patient/parent/guardian. Close follow up outpatient was discussed, comfortable with the plan.   Medications  ondansetron (ZOFRAN) injection 4 mg (4 mg Intravenous Given 10/19/14 1017)  iohexol (OMNIPAQUE) 350 MG/ML injection 100 mL (100 mLs  Intravenous Contrast Given 10/19/14 1126)  ipratropium-albuterol (DUONEB) 0.5-2.5 (3) MG/3ML nebulizer solution 3 mL (3 mLs Nebulization Given 10/19/14 1138)    Filed Vitals:   10/19/14 0907 10/19/14 1021 10/19/14 1138 10/19/14 1213  BP:  152/69  181/83  Pulse:  56  87  Temp: 96.9 F (36.1 C)   98 F (36.7 C)  TempSrc: Rectal Oral  Oral  Resp:  15  18  SpO2:  99% 95% 98%    Final diagnoses:  Dyspnea  History of COPD       Mariea Clonts, MD 10/19/14 1328

## 2014-10-22 ENCOUNTER — Telehealth: Payer: Self-pay | Admitting: *Deleted

## 2014-10-22 NOTE — Telephone Encounter (Signed)
Received phone call from patient's sister stating that patient is not feeling well today.  Called patient and he stated that he feels "sick to his stomach".  Patient stated he has not taken his nausea medicine today. Informed patient to take his Zofran as prescribed and to call back with worsening symptoms.  Patient verbalized understanding.

## 2014-10-23 ENCOUNTER — Other Ambulatory Visit: Payer: Self-pay | Admitting: Medical Oncology

## 2014-10-23 DIAGNOSIS — C3490 Malignant neoplasm of unspecified part of unspecified bronchus or lung: Secondary | ICD-10-CM

## 2014-10-27 ENCOUNTER — Encounter: Payer: Self-pay | Admitting: Physician Assistant

## 2014-10-27 ENCOUNTER — Telehealth: Payer: Self-pay | Admitting: *Deleted

## 2014-10-27 ENCOUNTER — Telehealth: Payer: Self-pay | Admitting: Internal Medicine

## 2014-10-27 ENCOUNTER — Other Ambulatory Visit (HOSPITAL_BASED_OUTPATIENT_CLINIC_OR_DEPARTMENT_OTHER): Payer: Medicare Other

## 2014-10-27 ENCOUNTER — Ambulatory Visit (HOSPITAL_BASED_OUTPATIENT_CLINIC_OR_DEPARTMENT_OTHER): Payer: Medicare Other | Admitting: Physician Assistant

## 2014-10-27 ENCOUNTER — Ambulatory Visit (HOSPITAL_BASED_OUTPATIENT_CLINIC_OR_DEPARTMENT_OTHER): Payer: Medicare Other

## 2014-10-27 VITALS — BP 129/55 | HR 62 | Temp 97.6°F | Resp 18 | Ht 74.0 in | Wt 220.7 lb

## 2014-10-27 DIAGNOSIS — C3432 Malignant neoplasm of lower lobe, left bronchus or lung: Secondary | ICD-10-CM

## 2014-10-27 DIAGNOSIS — C3402 Malignant neoplasm of left main bronchus: Secondary | ICD-10-CM

## 2014-10-27 DIAGNOSIS — C3490 Malignant neoplasm of unspecified part of unspecified bronchus or lung: Secondary | ICD-10-CM

## 2014-10-27 DIAGNOSIS — Z5111 Encounter for antineoplastic chemotherapy: Secondary | ICD-10-CM

## 2014-10-27 LAB — CBC WITH DIFFERENTIAL/PLATELET
BASO%: 0.3 % (ref 0.0–2.0)
BASOS ABS: 0 10*3/uL (ref 0.0–0.1)
EOS ABS: 0 10*3/uL (ref 0.0–0.5)
EOS%: 0 % (ref 0.0–7.0)
HCT: 34 % — ABNORMAL LOW (ref 38.4–49.9)
HGB: 11.5 g/dL — ABNORMAL LOW (ref 13.0–17.1)
LYMPH%: 5.3 % — AB (ref 14.0–49.0)
MCH: 34.8 pg — AB (ref 27.2–33.4)
MCHC: 33.8 g/dL (ref 32.0–36.0)
MCV: 103 fL — ABNORMAL HIGH (ref 79.3–98.0)
MONO#: 0.5 10*3/uL (ref 0.1–0.9)
MONO%: 5 % (ref 0.0–14.0)
NEUT#: 8.5 10*3/uL — ABNORMAL HIGH (ref 1.5–6.5)
NEUT%: 89.4 % — ABNORMAL HIGH (ref 39.0–75.0)
Platelets: 177 10*3/uL (ref 140–400)
RBC: 3.3 10*6/uL — AB (ref 4.20–5.82)
RDW: 16.3 % — AB (ref 11.0–14.6)
WBC: 9.5 10*3/uL (ref 4.0–10.3)
lymph#: 0.5 10*3/uL — ABNORMAL LOW (ref 0.9–3.3)

## 2014-10-27 LAB — COMPREHENSIVE METABOLIC PANEL (CC13)
ALBUMIN: 4 g/dL (ref 3.5–5.0)
ALT: 20 U/L (ref 0–55)
AST: 19 U/L (ref 5–34)
Alkaline Phosphatase: 103 U/L (ref 40–150)
Anion Gap: 9 mEq/L (ref 3–11)
BILIRUBIN TOTAL: 0.42 mg/dL (ref 0.20–1.20)
BUN: 25.5 mg/dL (ref 7.0–26.0)
CALCIUM: 9.7 mg/dL (ref 8.4–10.4)
CO2: 29 mEq/L (ref 22–29)
CREATININE: 1.2 mg/dL (ref 0.7–1.3)
Chloride: 102 mEq/L (ref 98–109)
EGFR: 58 mL/min/{1.73_m2} — AB (ref >90)
Glucose: 111 mg/dl (ref 70–140)
Potassium: 4.4 mEq/L (ref 3.5–5.1)
Sodium: 141 mEq/L (ref 136–145)
Total Protein: 7 g/dL (ref 6.4–8.3)

## 2014-10-27 MED ORDER — SODIUM CHLORIDE 0.9 % IV SOLN
500.0000 mg/m2 | Freq: Once | INTRAVENOUS | Status: AC
  Administered 2014-10-27: 1150 mg via INTRAVENOUS
  Filled 2014-10-27: qty 46

## 2014-10-27 MED ORDER — ONDANSETRON 8 MG/NS 50 ML IVPB
INTRAVENOUS | Status: AC
  Filled 2014-10-27: qty 8

## 2014-10-27 MED ORDER — SODIUM CHLORIDE 0.9 % IV SOLN
Freq: Once | INTRAVENOUS | Status: AC
  Administered 2014-10-27: 13:00:00 via INTRAVENOUS

## 2014-10-27 MED ORDER — DEXAMETHASONE SODIUM PHOSPHATE 10 MG/ML IJ SOLN
INTRAMUSCULAR | Status: AC
  Filled 2014-10-27: qty 1

## 2014-10-27 MED ORDER — ONDANSETRON 8 MG/50ML IVPB (CHCC)
8.0000 mg | Freq: Once | INTRAVENOUS | Status: AC
  Administered 2014-10-27: 8 mg via INTRAVENOUS

## 2014-10-27 MED ORDER — DEXAMETHASONE SODIUM PHOSPHATE 10 MG/ML IJ SOLN
10.0000 mg | Freq: Once | INTRAMUSCULAR | Status: AC
  Administered 2014-10-27: 10 mg via INTRAVENOUS

## 2014-10-27 NOTE — Progress Notes (Addendum)
[place] [phone]   Fax:([phone]) [place], MD [address] [place]  [phone]  DIAGNOSIS: Stage IIIB (T2b, N3, M0) non-small cell lung cancer, adenocarcinoma, diagnosed in June of 2015 with large left lower lobe lung mass in addition to ipsilateral and contralateral mediastinal lymphadenopathy.  PRIOR THERAPY: Systemic chemotherapy with carboplatin for AUC of 5 and Alimta 500 mg/M2 every 3 weeks but status post 6 cycles. Last dose was given 09/08/2014 with partial response.  CURRENT THERAPY: Maintenance chemotherapy with single agent Alimta 500 MG/M2 every 3 weeks. First dose 10/27/2014.  INTERVAL HISTORY: Tyler [name] 73 y.o. male returns to the clinic today for followup visit accompanied by his wife. He tolerated the last cycle of his systemic chemotherapy with carboplatin and Alimta fairly well except for mild fatigue. He continues to have baseline shortness of breath and currently on home oxygen. He denied having any significant chest pain, cough or hemoptysis. He has no weight loss or night sweats. The patient denied having any significant vomiting, no fever or chills. He presents to start his first cycle of maintenance chemotherapy with single agent Alimta.   MEDICAL HISTORY: Past Medical History  Diagnosis Date  . Allergic rhinitis   . BPH (benign prostatic hyperplasia)   . GERD (gastroesophageal reflux disease)   . COPD (chronic obstructive pulmonary disease)   . Shortness of breath   . Arthritis   . Enlarged prostate   . Hypertension     dr Margreta Journey little in [place]   pcp      dr Stanford Breed  cardiac md  . Mental disorder   . Constipation   . Chronic chest pain   . Pneumonia     hx  . Cancer Lung  . Lung cancer 04/11/14    lul carina lesion=Adenocarcinoma    ALLERGIES:  is allergic to ketorolac tromethamine; codeine; morphine and related; tramadol; and tussionex pennkinetic er.  MEDICATIONS:    Current Outpatient Prescriptions  Medication Sig Dispense Refill  . albuterol (PROVENTIL) (2.5 MG/3ML) 0.083% nebulizer solution Take 3 mLs (2.5 mg total) by nebulization every 6 (six) hours as needed for wheezing. 75 mL 3  . dexamethasone (DECADRON) 4 MG tablet Take 1 tablet by mouth twice a day, the day before, the day of and the day after chemotherapy. Take with food 30 tablet 1  . escitalopram (LEXAPRO) 20 MG tablet Take 20 mg by mouth daily.   12  . flurazepam (DALMANE) 30 MG capsule Take 30 mg by mouth at bedtime as needed for sleep.    . folic acid (FOLVITE) 1 MG tablet TAKE 1 TABLET (1 MG TOTAL) BY MOUTH DAILY. 30 tablet 1  . naproxen (NAPROSYN) 500 MG tablet Take 500 mg by mouth 2 (two) times daily with a meal.    . nebivolol (BYSTOLIC) 10 MG tablet Take 10 mg by mouth 2 (two) times daily. 1 tab bid    . omeprazole (PRILOSEC) 20 MG capsule Take 20 mg by mouth 2 (two) times daily before a meal.     . ondansetron (ZOFRAN) 8 MG tablet Take 1 tablet (8 mg total) by mouth every 8 (eight) hours as needed for nausea or vomiting. 20 tablet 1  . oxyCODONE (ROXICODONE) 15 MG immediate release tablet Take 15 mg by mouth every 4 (four) hours as needed for pain.    Marland Kitchen oxyCODONE-acetaminophen (PERCOCET/ROXICET) 5-325 MG per tablet Take 1-2 tablets by mouth every 6 (six)  hours as needed for severe pain. 60 tablet 0  . PRESCRIPTION MEDICATION Chemotherapy at [facility] (Dr. Julien Nordmann).  Last dose Carboplatin / Alimta was 09/08/14.  Planning to start single agent Alimta every 3 weeks beginning 10/27/14.    Marland Kitchen prochlorperazine (COMPAZINE) 10 MG tablet Take 1 tablet (10 mg total) by mouth every 6 (six) hours as needed for nausea or vomiting. 30 tablet 1  . Tamsulosin HCl (FLOMAX) 0.4 MG CAPS Take 0.4 mg by mouth every morning.     Marland Kitchen albuterol (PROVENTIL HFA;VENTOLIN HFA) 108 (90 BASE) MCG/ACT inhaler Inhale 2 puffs into the lungs every 6 (six) hours as needed for wheezing or shortness of breath.     No current  facility-administered medications for this visit.    SURGICAL HISTORY:  Past Surgical History  Procedure Laterality Date  . Ankle surgery  2005  . Prostate surgery  11/25/2010  . Hernia repair    . Mediastinoscopy  06/11/2012    Procedure: MEDIASTINOSCOPY;  Surgeon: Grace Isaac, MD;  Location: [place];  Service: Thoracic;  Laterality: N/A;  . Inguinal hernia repair      left  . Eye surgery      cataract right  . Video bronchoscopy with endobronchial ultrasound  10/08/2012    Procedure: VIDEO BRONCHOSCOPY WITH ENDOBRONCHIAL ULTRASOUND;  Surgeon: Collene Gobble, MD;  Location: [facility];  Service: Pulmonary;  Laterality: N/A;  . Video bronchoscopy Bilateral 04/11/2014    Procedure: VIDEO BRONCHOSCOPY WITHOUT FLUORO;  Surgeon: Collene Gobble, MD;  Location: [facility];  Service: Cardiopulmonary;  Laterality: Bilateral;    REVIEW OF SYSTEMS:  Constitutional: positive for fatigue Eyes: negative Ears, nose, mouth, throat, and face: negative Respiratory: positive for dyspnea on exertion Cardiovascular: negative Gastrointestinal: negative Genitourinary:negative Integument/breast: negative Hematologic/lymphatic: negative Musculoskeletal:negative Neurological: negative Behavioral/Psych: negative Endocrine: negative Allergic/Immunologic: negative   PHYSICAL EXAMINATION: General appearance: alert, cooperative and no distress Head: Normocephalic, without obvious abnormality, atraumatic Neck: no adenopathy, no JVD, supple, symmetrical, trachea midline and thyroid not enlarged, symmetric, no tenderness/mass/nodules Lymph nodes: Cervical, supraclavicular, and axillary nodes normal. Resp: wheezes bilaterally Back: symmetric, no curvature. ROM normal. No CVA tenderness. Cardio: regular rate and rhythm, S1, S2 normal, no murmur, click, rub or gallop GI: soft, non-tender; bowel sounds normal; no masses,  no organomegaly Extremities: extremities normal, atraumatic, no cyanosis or  edema Neurologic: Alert and oriented X 3, normal strength and tone. Normal symmetric reflexes. Normal coordination and gait  ECOG PERFORMANCE STATUS: 1 - Symptomatic but completely ambulatory  Blood pressure 129/55, pulse 62, temperature 97.6 F (36.4 C), temperature source Oral, resp. rate 18, height 6\' 2"  (1.88 m), weight 220 lb 11.2 oz (100.109 kg), SpO2 95 %.  LABORATORY DATA: Lab Results  Component Value Date   WBC 9.5 10/27/2014   HGB 11.5* 10/27/2014   HCT 34.0* 10/27/2014   MCV 103.0* 10/27/2014   PLT 177 10/27/2014      Chemistry      Component Value Date/Time   NA 141 10/27/2014 1125   NA 138 10/19/2014 0900   K 4.4 10/27/2014 1125   K 4.8 10/19/2014 0900   CL 98 10/19/2014 0900   CO2 29 10/27/2014 1125   CO2 25 10/19/2014 0900   BUN 25.5 10/27/2014 1125   BUN 21 10/19/2014 0900   CREATININE 1.2 10/27/2014 1125   CREATININE 1.04 10/19/2014 0900      Component Value Date/Time   CALCIUM 9.7 10/27/2014 1125   CALCIUM 9.8 10/19/2014 0900   ALKPHOS 103 10/27/2014 1125  ALKPHOS 118* 11/08/2013 1524   AST 19 10/27/2014 1125   AST 20 11/08/2013 1524   ALT 20 10/27/2014 1125   ALT 19 11/08/2013 1524   BILITOT 0.42 10/27/2014 1125   BILITOT 0.6 11/08/2013 1524       RADIOGRAPHIC STUDIES: Dg Chest 2 View  10/19/2014   CLINICAL DATA:  Shortness of breath.  History of lung cancer.  COPD  EXAM: CHEST  2 VIEW  COMPARISON:  Chest radiograph 08/26/2014  FINDINGS: Exam limited secondary to motion artifact. Multiple monitoring leads overlie the patient. Stable enlarged cardiac and mediastinal contours. Persistent heterogeneous opacities left lung base. Small left pleural effusion. No definite pneumothorax. Regional skeleton is unremarkable.  IMPRESSION: Persistent heterogeneous opacities left lung base. Small left pleural effusion.   Electronically Signed   By: Lovey Newcomer M.D.   On: 10/19/2014 10:02   Ct Angio Chest Pe W/cm &/or Wo Cm  10/19/2014   CLINICAL DATA:   Diaphoresis.  Stage III lung carcinoma  EXAM: CT ANGIOGRAPHY CHEST WITH CONTRAST  TECHNIQUE: Multidetector CT imaging of the chest was performed using the standard protocol during bolus administration of intravenous contrast. Multiplanar CT image reconstructions and MIPs were obtained to evaluate the vascular anatomy.  CONTRAST:  168mL OMNIPAQUE IOHEXOL 350 MG/ML SOLN  COMPARISON:  Chest CT September 26, 2014 and chest radiograph October 19, 2014  FINDINGS: There is no demonstrable pulmonary embolus. Several lower lobe pulmonary arteries are encased by tumor and consolidation/fibrosis. There is atherosclerotic change in the aorta but no appreciable thoracic aortic aneurysm or dissection.  There is a degree of underlying centrilobular type emphysematous change. There is a small left effusion. The spiculated lesion in the left lower lobe noted previously is again noted measuring 2.3 x 1.9 cm, not felt to be significantly changed. There is surrounding consolidation and likely fibrosis. There is patchy atelectasis in this area as well, stable.  There is lower lobe bronchiectatic change bilaterally which is stable. These changes are more severe in the area of the mass and fibrosis in the left lower lobe than on the right, although these changes are present bilaterally.  There is no new lung opacity.  There are multiple small mediastinal lymph nodes,, stable. There is no frank adenopathy by size criteria currently.  Pericardium is not thickened. There is a moderate hiatal type hernia.  In the visualized upper abdomen, there are a few small hepatic granulomas, calcified.  There are no blastic or lytic bone lesions. Thyroid appears unremarkable.  Review of the MIP images confirms the above findings.  IMPRESSION: No demonstrable pulmonary embolus.  Stable mass with surrounding atelectasis and fibrosis. Left effusion, stable. Underlying emphysema with bilateral lower lobe bronchiectatic change, stable. Multiple small  mediastinal lymph nodes but no frank adenopathy by size criteria. No new parenchymal lung lesion.  Hiatal hernia again noted.   Electronically Signed   By: Lowella Grip M.D.   On: 10/19/2014 11:48   ASSESSMENT AND PLAN: This is a very pleasant 73 years old white male recently diagnosed with a stage IIIB non-small cell lung cancer, adenocarcinoma with large left lower lobe lung mass in addition to ipsilateral and contralateral mediastinal lymphadenopathy. He was not a candidate for concurrent radiotherapy because of the large volume of treatment area.  The patient underwent a course of systemic chemotherapy with carboplatin and Alimta status post 6 cycles. He tolerated his treatment fairly well and the recent CT scan of the chest showed further improvement in his disease. For pain management he  will continue on Percocet on as-needed basis. Patient was discussed with and also seen by Dr. Julien Nordmann. He will proceed with cycle #1 maintenance chemotherapy with single agent Alimta at 500 mg/m to be given every 3 weeks. He will follow-up in 3 weeks for another symptom management visit prior to cycle #2.  He was advised to call immediately if he has any concerning symptoms in the interval.  The patient voices understanding of current disease status and treatment options and is in agreement with the current care plan.  All questions were answered. The patient knows to call the clinic with any problems, questions or concerns. We can certainly see the patient much sooner if necessary.  Carlton Adam, PA-C 10/27/2014  ADDENDUM: Hematology/Oncology Attending: I had a face to face encounter with the patient today. I recommended his care plan. This is a very pleasant 73 years old white male with stage IIIB non-small cell lung cancer status post 6 cycles of systemic chemotherapy with carboplatin and Alimta with partial response. The patient is feeling fine today and he is ready to start the first cycle of  maintenance chemotherapy with single agent Alimta. I reminded the patient adverse effect of this treatment. He will proceed with the first cycle today as a scheduled. He will come back for follow-up visit in 3 weeks with the next cycle of his treatment. He was advised to call immediately if he has any concerning symptoms in the interval.  Disclaimer: This note was dictated with voice recognition software. Similar sounding words can inadvertently be transcribed and may be missed upon review. Eilleen Kempf., MD 10/27/2014

## 2014-10-27 NOTE — Patient Instructions (Signed)
Follow-up in 3 weeks prior to your next scheduled cycle of maintenance chemotherapy

## 2014-10-27 NOTE — Patient Instructions (Signed)
[place] Discharge Instructions for Patients Receiving Chemotherapy  Today you received the following chemotherapy agents:  Alimta  To help prevent nausea and vomiting after your treatment, we encourage you to take your nausea medication as ordered per MD.   If you develop nausea and vomiting that is not controlled by your nausea medication, call the clinic.   BELOW ARE SYMPTOMS THAT SHOULD BE REPORTED IMMEDIATELY:  *FEVER GREATER THAN 100.5 F  *CHILLS WITH OR WITHOUT FEVER  NAUSEA AND VOMITING THAT IS NOT CONTROLLED WITH YOUR NAUSEA MEDICATION  *UNUSUAL SHORTNESS OF BREATH  *UNUSUAL BRUISING OR BLEEDING  TENDERNESS IN MOUTH AND THROAT WITH OR WITHOUT PRESENCE OF ULCERS  *URINARY PROBLEMS  *BOWEL PROBLEMS  UNUSUAL RASH Items with * indicate a potential emergency and should be followed up as soon as possible.  Feel free to call the clinic you have any questions or concerns. The clinic phone number is [phone].

## 2014-10-27 NOTE — Telephone Encounter (Signed)
Per staff message and POF I have scheduled appts. Advised scheduler of appts. JMW  

## 2014-10-30 ENCOUNTER — Telehealth: Payer: Self-pay | Admitting: Emergency Medicine

## 2014-10-30 NOTE — Telephone Encounter (Addendum)
Called and spoke to pt. Pt stated he is doing well and does not feel he needs to come in for an appt. Informed pt, the point of the visit it to follow up and re-group after the ED visit to try and prevent any hospital visit or his COPD worsening. Pt verbalized understanding but continued to refuse an appt. Informed pt to not hesitate to call if he changes his mind or if his breathing in any way worsens. Pt verbalized understanding and denied any further questions or concerns at this time.   Will forward to Dr. Lamonte Sakai as Juluis Rainier.

## 2014-11-02 ENCOUNTER — Encounter (HOSPITAL_COMMUNITY): Payer: Self-pay | Admitting: Emergency Medicine

## 2014-11-02 ENCOUNTER — Emergency Department (HOSPITAL_COMMUNITY): Payer: Medicare Other

## 2014-11-02 ENCOUNTER — Emergency Department (HOSPITAL_COMMUNITY)
Admission: EM | Admit: 2014-11-02 | Discharge: 2014-11-02 | Disposition: A | Discharge status: Home / Self Care | Payer: Medicare Other | Attending: Emergency Medicine | Admitting: Emergency Medicine

## 2014-11-02 DIAGNOSIS — H538 Other visual disturbances: Secondary | ICD-10-CM | POA: Diagnosis not present

## 2014-11-02 DIAGNOSIS — Z79899 Other long term (current) drug therapy: Secondary | ICD-10-CM | POA: Diagnosis not present

## 2014-11-02 DIAGNOSIS — R4182 Altered mental status, unspecified: Secondary | ICD-10-CM | POA: Diagnosis not present

## 2014-11-02 DIAGNOSIS — H539 Unspecified visual disturbance: Secondary | ICD-10-CM

## 2014-11-02 DIAGNOSIS — R9431 Abnormal electrocardiogram [ECG] [EKG]: Secondary | ICD-10-CM

## 2014-11-02 DIAGNOSIS — Z8659 Personal history of other mental and behavioral disorders: Secondary | ICD-10-CM | POA: Insufficient documentation

## 2014-11-02 DIAGNOSIS — N400 Enlarged prostate without lower urinary tract symptoms: Secondary | ICD-10-CM | POA: Insufficient documentation

## 2014-11-02 DIAGNOSIS — Z87891 Personal history of nicotine dependence: Secondary | ICD-10-CM | POA: Diagnosis not present

## 2014-11-02 DIAGNOSIS — Z8701 Personal history of pneumonia (recurrent): Secondary | ICD-10-CM | POA: Insufficient documentation

## 2014-11-02 DIAGNOSIS — Z85118 Personal history of other malignant neoplasm of bronchus and lung: Secondary | ICD-10-CM | POA: Insufficient documentation

## 2014-11-02 DIAGNOSIS — I10 Essential (primary) hypertension: Secondary | ICD-10-CM | POA: Diagnosis not present

## 2014-11-02 DIAGNOSIS — J449 Chronic obstructive pulmonary disease, unspecified: Secondary | ICD-10-CM | POA: Diagnosis not present

## 2014-11-02 DIAGNOSIS — I4581 Long QT syndrome: Secondary | ICD-10-CM | POA: Insufficient documentation

## 2014-11-02 DIAGNOSIS — K219 Gastro-esophageal reflux disease without esophagitis: Secondary | ICD-10-CM | POA: Diagnosis not present

## 2014-11-02 DIAGNOSIS — M1990 Unspecified osteoarthritis, unspecified site: Secondary | ICD-10-CM | POA: Insufficient documentation

## 2014-11-02 DIAGNOSIS — Z791 Long term (current) use of non-steroidal anti-inflammatories (NSAID): Secondary | ICD-10-CM | POA: Diagnosis not present

## 2014-11-02 LAB — BASIC METABOLIC PANEL
Anion gap: 9 (ref 5–15)
BUN: 32 mg/dL — AB (ref 6–23)
CHLORIDE: 101 meq/L (ref 96–112)
CO2: 27 mmol/L (ref 19–32)
CREATININE: 1.16 mg/dL (ref 0.50–1.35)
Calcium: 8.9 mg/dL (ref 8.4–10.5)
GFR calc Af Amer: 71 mL/min — ABNORMAL LOW (ref >90)
GFR calc non Af Amer: 61 mL/min — ABNORMAL LOW (ref >90)
Glucose, Bld: 134 mg/dL — ABNORMAL HIGH (ref 70–99)
Potassium: 3.6 mmol/L (ref 3.5–5.1)
Sodium: 137 mmol/L (ref 135–145)

## 2014-11-02 LAB — CBC WITH DIFFERENTIAL/PLATELET
Basophils Absolute: 0 10*3/uL (ref 0.0–0.1)
Basophils Relative: 1 % (ref 0–1)
EOS ABS: 0.1 10*3/uL (ref 0.0–0.7)
Eosinophils Relative: 3 % (ref 0–5)
HCT: 32.7 % — ABNORMAL LOW (ref 39.0–52.0)
Hemoglobin: 11.2 g/dL — ABNORMAL LOW (ref 13.0–17.0)
Lymphocytes Relative: 20 % (ref 12–46)
Lymphs Abs: 0.8 10*3/uL (ref 0.7–4.0)
MCH: 34.9 pg — AB (ref 26.0–34.0)
MCHC: 34.3 g/dL (ref 30.0–36.0)
MCV: 101.9 fL — AB (ref 78.0–100.0)
MONO ABS: 0.2 10*3/uL (ref 0.1–1.0)
MONOS PCT: 4 % (ref 3–12)
Neutro Abs: 3.1 10*3/uL (ref 1.7–7.7)
Neutrophils Relative %: 72 % (ref 43–77)
Platelets: 128 10*3/uL — ABNORMAL LOW (ref 150–400)
RBC: 3.21 MIL/uL — ABNORMAL LOW (ref 4.22–5.81)
RDW: 14 % (ref 11.5–15.5)
WBC: 4.2 10*3/uL (ref 4.0–10.5)

## 2014-11-02 LAB — SEDIMENTATION RATE: Sed Rate: 60 mm/hr — ABNORMAL HIGH (ref 0–16)

## 2014-11-02 MED ORDER — TETRACAINE HCL 0.5 % OP SOLN
1.0000 [drp] | Freq: Once | OPHTHALMIC | Status: DC
Start: 2014-11-02 — End: 2014-11-02
  Filled 2014-11-02: qty 2

## 2014-11-02 MED ORDER — TROPICAMIDE 1 % OP SOLN
1.0000 [drp] | Freq: Once | OPHTHALMIC | Status: AC
  Administered 2014-11-02: 1 [drp] via OPHTHALMIC
  Filled 2014-11-02: qty 2

## 2014-11-02 MED ORDER — PHENYLEPHRINE HCL 2.5 % OP SOLN
1.0000 [drp] | Freq: Once | OPHTHALMIC | Status: DC
  Filled 2014-11-02: qty 2

## 2014-11-02 NOTE — ED Notes (Addendum)
Pt from home c/o left eye with blurred vision around 12 today. He reports confusion yesterday but is better today but feels fatigued. Pt denies weakness. He answers questions appropriately.

## 2014-11-02 NOTE — ED Notes (Signed)
Lab called stating that need re-collect on lavender tube due to not enough specimen to run sed rate.

## 2014-11-02 NOTE — Consult Note (Signed)
Tyler [name]                                                                                         11/02/2014                                               Ophthalmology Evaluation                                         I.   IDENTIFYING DATA      A. NAME@ a 74 y.o. male. MRN is [id number].      B.  Primary care physician: Tamsen Roers, MD      C.  Attending Provider: Quintella Reichert, [place] COMPLAINT/REASON FOR CONSULTATION        Consult Requested by: Dr. Ralene Bathe        Consultation requested for:  Decreased vision Left eye   III.   HISTORY OF PRESENT ILLNESS       Tyler [name] is a 74 y.o. man presenting with vision change of the Left eye.  He reports noting this today at around noon "at lunchtime," but is unsure of when the change in vision started. He describes "it may have also been worse on the drive to lunch."  He also reports "confusion" and disorientation without focal weakness yesterday evening, and is uncertain if his vision was normal in both eyes at that time. He does report that he "felt better" this morning without symptoms of confusion today.  Upon speaking with the patient, he reports "I think I'm seeing better now." He specifically denies flashes, floaters, eye pain.  He also denies weight changes and headache, he is unsure of changes with scalp tenderness recently, he reports appetite changes only with chemo over the last few months, and specifically denies pain with chewing or moving jaw. Tyler [name] reports that he was scheduled for cataract surgery in the left eye last year but canceled, last eye exam was approximately 1 year ago at that time, he is unsure of location, believes "at [facility]."   IV.  PAST OCULAR HISTORY       Past Ocular Hx:      Cataract OS, Pseudophakia OD       Past Ocular Surgical Hx:         Cataract surgery Right eye 7-8 years ago per patient       Family Ocular Hx:                        Sister: cataracts       Ocular  Medications:                     None   V.   PAST MEDICAL HISTORY        Past Medical History  Diagnosis Date  .  Allergic rhinitis   . BPH (benign prostatic hyperplasia)   . GERD (gastroesophageal reflux disease)   . COPD (chronic obstructive pulmonary disease)   . Shortness of breath   . Arthritis   . Enlarged prostate   . Hypertension     dr Margreta Journey little in [place]   pcp      dr Stanford Breed  cardiac md  . Mental disorder   . Constipation   . Chronic chest pain   . Pneumonia     hx  . Cancer Lung  . Lung cancer 04/11/14    lul carina lesion=Adenocarcinoma            Active Ambulatory Problems    Diagnosis Date Noted  . COPD (chronic obstructive pulmonary disease) 02/09/2012  . Lung mass 02/09/2012  . Preop cardiovascular exam 05/23/2012  . Chest pain 05/23/2012  . Hypertension 05/23/2012  . Acute delirium 10/12/2012  . Dyspnea 11/08/2013  . Chronic respiratory failure 11/09/2013  . Lung cancer 04/23/2014  . Neoplasm related pain 08/26/2014  . Constipation 08/26/2014  . Nausea without vomiting 08/26/2014  . Anorexia 08/26/2014  . Weight loss 08/26/2014  . Neoplastic malignant related fatigue 08/26/2014  . Other pancytopenia 08/26/2014  . Malignant neoplasm of hilus of left lung 09/29/2014   Resolved Ambulatory Problems    Diagnosis Date Noted  . No Resolved Ambulatory Problems   Past Medical History  Diagnosis Date  . Allergic rhinitis   . BPH (benign prostatic hyperplasia)   . GERD (gastroesophageal reflux disease)   . Shortness of breath   . Arthritis   . Enlarged prostate   . Mental disorder   . Chronic chest pain   . Pneumonia   . Cancer Lung         VI.  PAST SURGICAL HISTORY        Past Surgical History  Procedure Laterality Date  . Ankle surgery  2005  . Prostate surgery  11/25/2010  . Hernia repair    . Mediastinoscopy  06/11/2012    Procedure: MEDIASTINOSCOPY;  Surgeon: Grace Isaac, MD;  Location: [facility];  Service: Thoracic;  Laterality:  N/A;  . Inguinal hernia repair      left  . Eye surgery      cataract right  . Video bronchoscopy with endobronchial ultrasound  10/08/2012    Procedure: VIDEO BRONCHOSCOPY WITH ENDOBRONCHIAL ULTRASOUND;  Surgeon: Collene Gobble, MD;  Location: [facility];  Service: Pulmonary;  Laterality: N/A;  . Video bronchoscopy Bilateral 04/11/2014    Procedure: VIDEO BRONCHOSCOPY WITHOUT FLUORO;  Surgeon: Collene Gobble, MD;  Location: [facility];  Service: Cardiopulmonary;  Laterality: Bilateral;      VII.  MEDICATIONS       A. Outpatient meds:  See epic chart       B. Inpatient meds: . tetracaine  1 drop Both Eyes Once  . tropicamide  1 drop Both Eyes Once     VIII. ALLERGIES       Allergies  Allergen Reactions  . Ketorolac Tromethamine Anaphylaxis    REACTION TO Tordal  . Codeine Itching  . Morphine And Related     Broke him out in a rash on abdomen  . Tramadol Rash  . Tussionex Pennkinetic Er [Hydrocod Polst-Cpm Polst Er] Rash      IX.  FAMILY HISTORY        A.  Family History  Problem Relation Age of Onset  . Heart disease Mother   . Arthritis Sister   .  Arthritis Sister   . Liver cancer Father       X.   LABS/RAD  CT HEAD WITHOUT CONTRAST:  "IMPRESSION: 1. No acute intracranial abnormality. 2. Stable remote left thalamic infarct "     XI.  PHYSICAL EXAM        A. Visual acuity:         Near acuity:   cc OD  20/25         Pinhole OD  NI      cc OS 20/ 70-2   Pinhole OS  NI        B. Pupils:              OD: 4 mm, 2+ reaction             OS: 4 mm, 2+ reaction           APD: None        D. Extraocular motility: full        E. Confrontation Visual fields (confrontation): grossly full OU        F. Intraocular Pressure / Tonometry (Tonopen):                                     Right Eye:       12 mmhg                                     Left Eye:         12 mmhg   Dilation:  both eyes  At 7:00 PM     Medication used Tropicamide 1%, Cyclogyl  XII.  SLIT LAMP  EXAM        A. External: normal        B. Lids/Lashes:       OD:  3+ Dermatochalasis      OS:  3+ Dermatochalasis        C.Conjunctiva/sclera:              OD: white and quiet              OS: white and quiet        D. Cornea:     OD: 2-3+ central SPK   OS: 1+ scattered SPK        E.  Anterior chamber/ iris:   OD: deep and quiet   OS: deep and quiet        F. Lens:     OD: PCIOL     OS: 3-4+ brunescent nuclear sclerosis   XIII.  DILATED EXAM - moderate hazy view OS with dense cataract        A. Vitreous:     OD: PVD, syneresis     OS: moderate hazy view with dense cataract, PVD, syneresis        B. Optic nerve:     OD:  flat, pink, sharp edges, no pallor     OS:  flat, pink, sharp edges, no pallor        C.  Cup-to-disc ratio:     OD: C/D: 0.6     OS: C/D: 0.6        D.  Macula:     OD: few RPE changes     OS: moderate hazy view with dense cataract,  few RPE changes  E.  Vessels:     OD: mild attenuation     OS: moderate hazy view with dense cataract, mild attenuation         F.  Periphery     OD: no holes or tears     OS: moderate hazy view with dense cataract, no holes or tears    XIV.  ASSESSMENT AND PLAN              1. Vision changes, OS   - Uncertain onset; patient feels vision "is getting better" at time of exam   - Baseline decreased VA OS vs. OD due to dense cataract OS   - Cannot rule out amaurosis fugax / TIA     - Improving vision on exam this evening / history of confusion yesterday    - No sign of current opthalmic vascular occlusion or ON pallor/edema on exam   - Recommend cardiovascular evaluation with carotid studies, lab studies, etc.   - ESR / CRP pending, though without present concerns for GCA on patient interview / ROS  2. Dense brunescent nuclear sclerosis OS - outpatient evaluation  3. Pseudophakia OD - good corrected distance visual acuity  4. 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  - Plan for follow-up with visual field examination and recheck of visual acuity tomorrow             - Follow up at [facility] on 11/03/2014                          [id number]                         [place], [place] [address]                          Call [phone] in AM to make appointment    - All questions were answered, importance of follow-up was emphasized. Patient and sister who is present were instructed to return to the Emergency Department if there are any additional vision changes or other concerns.  The importance of follow-up was emphasized.    Signed: 11/02/2014 7:53 PM Georgana Curio, DO

## 2014-11-02 NOTE — Discharge Instructions (Signed)
You need to stop taking your compazine and naproxen.  Get rechecked immediately if you have any new problems such as confusion, weakness, numbness, difficulty breathing.    Transient Ischemic Attack A transient ischemic attack (TIA) is a "warning stroke" that causes stroke-like symptoms. Unlike a stroke, a TIA does not cause permanent damage to the brain. The symptoms of a TIA can happen very fast and do not last long. It is important to know the symptoms of a TIA and what to do. This can help prevent a major stroke or death. CAUSES   A TIA is caused by a temporary blockage in an artery in the brain or neck (carotid artery). The blockage does not allow the brain to get the blood supply it needs and can cause different symptoms. The blockage can be caused by either:  A blood clot.  Fatty buildup (plaque) in a neck or brain artery. RISK FACTORS  High blood pressure (hypertension).  High cholesterol.  Diabetes mellitus.  Heart disease.  The build up of plaque in the blood vessels (peripheral artery disease or atherosclerosis).  The build up of plaque in the blood vessels providing blood and oxygen to the brain (carotid artery stenosis).  An abnormal heart rhythm (atrial fibrillation).  Obesity.  Smoking.  Taking oral contraceptives (especially in combination with smoking).  Physical inactivity.  A diet high in fats, salt (sodium), and calories.  Alcohol use.  Use of illegal drugs (especially cocaine and methamphetamine).  Being male.  Being African American.  Being over the age of 10.  Family history of stroke.  Previous history of blood clots, stroke, TIA, or heart attack.  Sickle cell disease. SYMPTOMS  TIA symptoms are the same as a stroke but are temporary. These symptoms usually develop suddenly, or may be newly present upon awakening from sleep:  Sudden weakness or numbness of the face, arm, or leg, especially on one side of the body.  Sudden trouble  walking or difficulty moving arms or legs.  Sudden confusion.  Sudden personality changes.  Trouble speaking (aphasia) or understanding.  Difficulty swallowing.  Sudden trouble seeing in one or both eyes.  Double vision.  Dizziness.  Loss of balance or coordination.  Sudden severe headache with no known cause.  Trouble reading or writing.  Loss of bowel or bladder control.  Loss of consciousness. DIAGNOSIS  Your caregiver may be able to determine the presence or absence of a TIA based on your symptoms, history, and physical exam. Computed tomography (CT scan) of the brain is usually performed to help identify a TIA. Other tests may be done to diagnose a TIA. These tests may include:  Electrocardiography.  Continuous heart monitoring.  Echocardiography.  Carotid ultrasonography.  Magnetic resonance imaging (MRI).  A scan of the brain circulation.  Blood tests. PREVENTION  The risk of a TIA can be decreased by appropriately treating high blood pressure, high cholesterol, diabetes, heart disease, and obesity and by quitting smoking, limiting alcohol, and staying physically active. TREATMENT  Time is of the essence. Since the symptoms of TIA are the same as a stroke, it is important to seek treatment as soon as possible because you may need a medicine to dissolve the clot (thrombolytic) that cannot be given if too much time has passed. Treatment options vary. Treatment options may include rest, oxygen, intravenous (IV) fluids, and medicines to thin the blood (anticoagulants). Medicines and diet may be used to address diabetes, high blood pressure, and other risk factors. Measures will be  taken to prevent short-term and long-term complications, including infection from breathing foreign material into the lungs (aspiration pneumonia), blood clots in the legs, and falls. Treatment options include procedures to either remove plaque in the carotid arteries or dilate carotid  arteries that have narrowed due to plaque. Those procedures are:  Carotid endarterectomy.  Carotid angioplasty and stenting. HOME CARE INSTRUCTIONS   Take all medicines prescribed by your caregiver. Follow the directions carefully. Medicines may be used to control risk factors for a stroke. Be sure you understand all your medicine instructions.  You may be told to take aspirin or the anticoagulant warfarin. Warfarin needs to be taken exactly as instructed.  Taking too much or too little warfarin is dangerous. Too much warfarin increases the risk of bleeding. Too little warfarin continues to allow the risk for blood clots. While taking warfarin, you will need to have regular blood tests to measure your blood clotting time. A PT blood test measures how long it takes for blood to clot. Your PT is used to calculate another value called an INR. Your PT and INR help your caregiver to adjust your dose of warfarin. The dose can change for many reasons. It is critically important that you take warfarin exactly as prescribed.  Many foods, especially foods high in vitamin K can interfere with warfarin and affect the PT and INR. Foods high in vitamin K include spinach, kale, broccoli, cabbage, collard and turnip greens, brussels sprouts, peas, cauliflower, seaweed, and parsley as well as beef and pork liver, green tea, and soybean oil. You should eat a consistent amount of foods high in vitamin K. Avoid major changes in your diet, or notify your caregiver before changing your diet. Arrange a visit with a dietitian to answer your questions.  Many medicines can interfere with warfarin and affect the PT and INR. You must tell your caregiver about any and all medicines you take, this includes all vitamins and supplements. Be especially cautious with aspirin and anti-inflammatory medicines. Do not take or discontinue any prescribed or over-the-counter medicine except on the advice of your caregiver or  pharmacist.  Warfarin can have side effects, such as excessive bruising or bleeding. You will need to hold pressure over cuts for longer than usual. Your caregiver or pharmacist will discuss other potential side effects.  Avoid sports or activities that may cause injury or bleeding.  Be mindful when shaving, flossing your teeth, or handling sharp objects.  Alcohol can change the body's ability to handle warfarin. It is best to avoid alcoholic drinks or consume only very small amounts while taking warfarin. Notify your caregiver if you change your alcohol intake.  Notify your dentist or other caregivers before procedures.  Eat a diet that includes 5 or more servings of fruits and vegetables each day. This may reduce the risk of stroke. Certain diets may be prescribed to address high blood pressure, high cholesterol, diabetes, or obesity.  A low-sodium, low-saturated fat, low-trans fat, low-cholesterol diet is recommended to manage high blood pressure.  A low-saturated fat, low-trans fat, low-cholesterol, and high-fiber diet may control cholesterol levels.  A controlled-carbohydrate, controlled-sugar diet is recommended to manage diabetes.  A reduced-calorie, low-sodium, low-saturated fat, low-trans fat, low-cholesterol diet is recommended to manage obesity.  Maintain a healthy weight.  Stay physically active. It is recommended that you get at least 30 minutes of activity on most or all days.  Do not smoke.  Limit alcohol use even if you are not taking warfarin. Moderate alcohol use  is considered to be:  No more than 2 drinks each day for men.  No more than 1 drink each day for nonpregnant women.  Stop drug abuse.  Home safety. A safe home environment is important to reduce the risk of falls. Your caregiver may arrange for specialists to evaluate your home. Having grab bars in the bedroom and bathroom is often important. Your caregiver may arrange for equipment to be used at home,  such as raised toilets and a seat for the shower.  Follow all instructions for follow-up with your caregiver. This is very important. This includes any referrals and lab tests. Proper follow up can prevent a stroke or another TIA from occurring. SEEK MEDICAL CARE IF:  You have personality changes.  You have difficulty swallowing.  You are seeing double.  You have dizziness.  You have a fever.  You have skin breakdown. SEEK IMMEDIATE MEDICAL CARE IF:  Any of these symptoms may represent a serious problem that is an emergency. Do not wait to see if the symptoms will go away. Get medical help right away. Call your local emergency services (911 in [place] Do not drive yourself to the hospital.  You have sudden weakness or numbness of the face, arm, or leg, especially on one side of the body.  You have sudden trouble walking or difficulty moving arms or legs.  You have sudden confusion.  You have trouble speaking (aphasia) or understanding.  You have sudden trouble seeing in one or both eyes.  You have a loss of balance or coordination.  You have a sudden, severe headache with no known cause.  You have new chest pain or an irregular heartbeat.  You have a partial or total loss of consciousness. MAKE SURE YOU:   Understand these instructions.  Will watch your condition.  Will get help right away if you are not doing well or get worse. Document Released: 07/27/2005 Document Revised: 10/22/2013 Document Reviewed: 01/22/2014 [facility] Patient Information 2015 [place], [place]. This information is not intended to replace advice given to you by your health care provider. Make sure you discuss any questions you have with your health care provider.

## 2014-11-02 NOTE — ED Provider Notes (Signed)
CSN: [id number]     Arrival date & time 11/02/14  1415 History   First MD Initiated Contact with Patient 11/02/14 1455     Chief Complaint  Patient presents with  . Altered Mental Status  . Blurred Vision     Patient is a 74 y.o. male presenting with altered mental status. The history is provided by the patient. No language interpreter was used.  Altered Mental Status  Tyler [name] presents for decreased vision in his left eye. His symptoms started at 11:30 today when he was getting ready to eat lunch. He reports mild associated headache which she states is barely there. He states that his vision was markedly decreased and very blurry where he could not even read with the left eye in the right eye was unaffected. He states that his vision is increasing significantly at this time, he states it is still blurry and he can't quite read he can see much more than he had earlier. He states he is currently receiving chemotherapy for lung cancer and he felt poorly yesterday with a small amount of vomiting and he was confused and couldn't figure out how to work his phone. He states the vomiting and confusion have resolved today. He wears glasses and has not had problems with vision loss suddenly before. Symptoms are moderate, constant, improving.  Past Medical History  Diagnosis Date  . Allergic rhinitis   . BPH (benign prostatic hyperplasia)   . GERD (gastroesophageal reflux disease)   . COPD (chronic obstructive pulmonary disease)   . Shortness of breath   . Arthritis   . Enlarged prostate   . Hypertension     dr Margreta Journey little in [place]   pcp      dr Stanford Breed  cardiac md  . Mental disorder   . Constipation   . Chronic chest pain   . Pneumonia     hx  . Cancer Lung  . 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 Ankle surgery  2005  . Prostate surgery  11/25/2010  . Hernia repair    . Mediastinoscopy  06/11/2012    Procedure:  MEDIASTINOSCOPY;  Surgeon: Grace Isaac, MD;  Location: [facility];  Service: Thoracic;  Laterality: N/A;  . Inguinal hernia repair      left  . Eye surgery      cataract right  . Video bronchoscopy with endobronchial ultrasound  10/08/2012    Procedure: VIDEO BRONCHOSCOPY WITH ENDOBRONCHIAL ULTRASOUND;  Surgeon: Collene Gobble, MD;  Location: [place];  Service: Pulmonary;  Laterality: N/A;  . Video bronchoscopy Bilateral 04/11/2014    Procedure: VIDEO BRONCHOSCOPY WITHOUT FLUORO;  Surgeon: Collene Gobble, MD;  Location: [facility];  Service: Cardiopulmonary;  Laterality: Bilateral;   Family History  Problem Relation Age of Onset  . Heart disease Mother   . Arthritis Sister   . Arthritis Sister   . Liver cancer Father    History  Substance Use Topics  . Smoking status: Former Smoker -- 2.00 packs/day for 50 years    Types: Cigarettes    Quit date: 12/26/2009  . Smokeless tobacco: Never Used  . Alcohol Use: 14.4 oz/week    24 Cans of beer per week     Comment: 4 beers every night    Review of Systems  All other systems reviewed and are negative.     Allergies  Ketorolac tromethamine; Codeine; Morphine and related; Tramadol;  and Tussionex pennkinetic er  Home Medications   Prior to Admission medications   Medication Sig Start Date End Date Taking? Authorizing Provider  albuterol (PROVENTIL HFA;VENTOLIN HFA) 108 (90 BASE) MCG/ACT inhaler Inhale 2 puffs into the lungs every 6 (six) hours as needed for wheezing or shortness of breath.    Historical Provider, MD  albuterol (PROVENTIL) (2.5 MG/3ML) 0.083% nebulizer solution Take 3 mLs (2.5 mg total) by nebulization every 6 (six) hours as needed for wheezing. 04/14/14   Collene Gobble, MD  dexamethasone (DECADRON) 4 MG tablet Take 1 tablet by mouth twice a day, the day before, the day of and the day after chemotherapy. Take with food 05/19/14   Carlton Adam, PA-C  escitalopram (LEXAPRO) 20 MG tablet Take 20 mg by mouth daily.   08/16/14   Historical Provider, MD  flurazepam (DALMANE) 30 MG capsule Take 30 mg by mouth at bedtime as needed for sleep.    Historical Provider, MD  folic acid (FOLVITE) 1 MG tablet TAKE 1 TABLET (1 MG TOTAL) BY MOUTH DAILY. 09/16/14   Carlton Adam, PA-C  naproxen (NAPROSYN) 500 MG tablet Take 500 mg by mouth 2 (two) times daily with a meal.    Historical Provider, MD  nebivolol (BYSTOLIC) 10 MG tablet Take 10 mg by mouth 2 (two) times daily. 1 tab bid    Historical Provider, MD  omeprazole (PRILOSEC) 20 MG capsule Take 20 mg by mouth 2 (two) times daily before a meal.     Historical Provider, MD  ondansetron (ZOFRAN) 8 MG tablet Take 1 tablet (8 mg total) by mouth every 8 (eight) hours as needed for nausea or vomiting. 09/29/14   Curt Bears, MD  oxyCODONE (ROXICODONE) 15 MG immediate release tablet Take 15 mg by mouth every 4 (four) hours as needed for pain.    Historical Provider, MD  oxyCODONE-acetaminophen (PERCOCET/ROXICET) 5-325 MG per tablet Take 1-2 tablets by mouth every 6 (six) hours as needed for severe pain. 09/29/14   Curt Bears, MD  PRESCRIPTION MEDICATION Chemotherapy at [facility] (Dr. Julien Nordmann).  Last dose Carboplatin / Alimta was 09/08/14.  Planning to start single agent Alimta every 3 weeks beginning 10/27/14.    Historical Provider, MD  prochlorperazine (COMPAZINE) 10 MG tablet Take 1 tablet (10 mg total) by mouth every 6 (six) hours as needed for nausea or vomiting. 09/29/14   Curt Bears, MD  Tamsulosin HCl (FLOMAX) 0.4 MG CAPS Take 0.4 mg by mouth every morning.     Historical Provider, MD   BP 138/60 mmHg  Pulse 75  Temp(Src) 97.8 F (36.6 C) (Oral)  SpO2 95% Physical Exam  Constitutional: He is oriented to person, place, and time. He appears well-developed and well-nourished. No distress.  HENT:  Head: Normocephalic and atraumatic.  Eyes: EOM are normal. Pupils are equal, round, and reactive to light.  Able to counts fingers with right eye and left eye  individually  Cardiovascular: Normal rate and regular rhythm.   No murmur heard. Pulmonary/Chest: Effort normal. No respiratory distress.  Mild rhonchi bilaterally  Abdominal: Soft. There is no tenderness. There is no rebound and no guarding.  Musculoskeletal: He exhibits no edema or tenderness.  Neurological: He is alert and oriented to person, place, and time. No cranial nerve deficit.  5 out of 5 strength in bilateral upper extremities and lower extremities, sensation to light touch intact in all 4 extremities  Skin: Skin is warm and dry.  Psychiatric: He has a normal mood and affect.  His behavior is normal.  Nursing note and vitals reviewed.   ED Course  Procedures (including critical care time) Labs Review Labs Reviewed  BASIC METABOLIC PANEL - Abnormal; Notable for the following:    Glucose, Bld 134 (*)    BUN 32 (*)    GFR calc non Af Amer 61 (*)    GFR calc Af Amer 71 (*)    All other components within normal limits  CBC WITH DIFFERENTIAL - Abnormal; Notable for the following:    RBC 3.21 (*)    Hemoglobin 11.2 (*)    HCT 32.7 (*)    MCV 101.9 (*)    MCH 34.9 (*)    Platelets 128 (*)    All other components within normal limits  SEDIMENTATION RATE - Abnormal; Notable for the following:    Sed Rate 60 (*)    All other components within normal limits  C-REACTIVE PROTEIN    Imaging Review Ct Head Wo Contrast  11/02/2014   CLINICAL DATA:  Confusion and vision loss. History of lung cancer diagnosed 6 months ago.  EXAM: CT HEAD WITHOUT CONTRAST  TECHNIQUE: Contiguous axial images were obtained from the base of the skull through the vertex without intravenous contrast.  COMPARISON:  CT head without contrast 05/05/2014.  FINDINGS: A remote lacunar infarct is again noted within the left thalamus. No acute cortical infarct, hemorrhage, or mass lesion is present. The ventricles are of normal size. No significant extra-axial fluid collection is present.  The paranasal sinuses and  mastoid air cells are clear. The calvarium is intact.  IMPRESSION: 1. No acute intracranial abnormality. 2. Stable remote left thalamic infarct   Electronically Signed   By: Lawrence Santiago M.D.   On: 11/02/2014 16:08     EKG Interpretation   Date/Time:  Sunday November 02 2014 15:21:23 EST Ventricular Rate:  66 PR Interval:  195 QRS Duration: 97 QT Interval:  579 QTC Calculation: 607 R Axis:   33 Text Interpretation:  Sinus rhythm Low voltage, precordial leads Prolonged  QT interval Baseline wander in lead(s) II Confirmed by Hazle Coca [phone])  on 11/02/2014 4:07:52 PM      MDM   Final diagnoses:  Prolonged Q-T interval on ECG  Change in vision    Patient here for evaluation of change in his vision, changes in mental status yesterday which has resolved today. Is currently getting treated for lung cancer. Discussed patient with Dr. Leonarda Salon with ophthalmology, who came in to evaluate the patient. While she recommends further evaluation for TIA. Patient's symptoms did improve in the emergency department and he stated that this patient was close to baseline obtained of the patient's emergency department stay. CT scan without evidence of acute stroke. Clinical picture is not consistent with temporal arteritis, patient without scalp tenderness, patient denies any local pain in the eye area pain. Discussed with patient recommendation for admission for TIA workup, especially considering patient does have prolonged QT on EKG which is new compared to priors. Patient declines workup at this time.  Discussed with patient risk of stroke or cardiac arrhythmia, and patient is understanding of these possibilities. Discussed with patient importance of PCP follow-up as well as ophthalmology follow-up and return precautions. Discussed with patient stopping his QT prolonged needing medications at this time including his Compazine.  Quintella Reichert, MD 11/02/14 [phone]

## 2014-11-02 NOTE — ED Notes (Signed)
Opthamologist at bedside.

## 2014-11-03 LAB — C-REACTIVE PROTEIN: CRP: 8 mg/dL — ABNORMAL HIGH (ref <0.60)

## 2014-11-17 ENCOUNTER — Ambulatory Visit (HOSPITAL_BASED_OUTPATIENT_CLINIC_OR_DEPARTMENT_OTHER): Payer: Medicare Other | Admitting: Internal Medicine

## 2014-11-17 ENCOUNTER — Ambulatory Visit (HOSPITAL_BASED_OUTPATIENT_CLINIC_OR_DEPARTMENT_OTHER): Payer: Medicare Other

## 2014-11-17 ENCOUNTER — Encounter: Payer: Self-pay | Admitting: Internal Medicine

## 2014-11-17 ENCOUNTER — Telehealth: Payer: Self-pay | Admitting: Internal Medicine

## 2014-11-17 ENCOUNTER — Other Ambulatory Visit: Payer: Medicare Other

## 2014-11-17 ENCOUNTER — Other Ambulatory Visit (HOSPITAL_BASED_OUTPATIENT_CLINIC_OR_DEPARTMENT_OTHER): Payer: Medicare Other

## 2014-11-17 VITALS — BP 109/61 | HR 67 | Resp 17 | Ht 74.0 in | Wt 216.4 lb

## 2014-11-17 DIAGNOSIS — C3432 Malignant neoplasm of lower lobe, left bronchus or lung: Secondary | ICD-10-CM

## 2014-11-17 DIAGNOSIS — C3402 Malignant neoplasm of left main bronchus: Secondary | ICD-10-CM

## 2014-11-17 DIAGNOSIS — Z5111 Encounter for antineoplastic chemotherapy: Secondary | ICD-10-CM

## 2014-11-17 LAB — COMPREHENSIVE METABOLIC PANEL (CC13)
ALBUMIN: 3.4 g/dL — AB (ref 3.5–5.0)
ALT: 22 U/L (ref 0–55)
ANION GAP: 8 meq/L (ref 3–11)
AST: 16 U/L (ref 5–34)
Alkaline Phosphatase: 80 U/L (ref 40–150)
BILIRUBIN TOTAL: 0.38 mg/dL (ref 0.20–1.20)
BUN: 32.3 mg/dL — AB (ref 7.0–26.0)
CO2: 28 mEq/L (ref 22–29)
Calcium: 8.7 mg/dL (ref 8.4–10.4)
Chloride: 100 mEq/L (ref 98–109)
Creatinine: 1 mg/dL (ref 0.7–1.3)
EGFR: 77 mL/min/{1.73_m2} — ABNORMAL LOW (ref >90)
GLUCOSE: 101 mg/dL (ref 70–140)
POTASSIUM: 4.1 meq/L (ref 3.5–5.1)
Sodium: 135 mEq/L — ABNORMAL LOW (ref 136–145)
Total Protein: 6.3 g/dL — ABNORMAL LOW (ref 6.4–8.3)

## 2014-11-17 LAB — CBC WITH DIFFERENTIAL/PLATELET
BASO%: 0.7 % (ref 0.0–2.0)
BASOS ABS: 0.1 10*3/uL (ref 0.0–0.1)
EOS%: 1.3 % (ref 0.0–7.0)
Eosinophils Absolute: 0.2 10*3/uL (ref 0.0–0.5)
HCT: 38.7 % (ref 38.4–49.9)
HGB: 12.8 g/dL — ABNORMAL LOW (ref 13.0–17.1)
LYMPH%: 12.6 % — ABNORMAL LOW (ref 14.0–49.0)
MCH: 33.9 pg — AB (ref 27.2–33.4)
MCHC: 33.1 g/dL (ref 32.0–36.0)
MCV: 102.5 fL — AB (ref 79.3–98.0)
MONO#: 1.4 10*3/uL — ABNORMAL HIGH (ref 0.1–0.9)
MONO%: 11.4 % (ref 0.0–14.0)
NEUT#: 8.8 10*3/uL — ABNORMAL HIGH (ref 1.5–6.5)
NEUT%: 74 % (ref 39.0–75.0)
Platelets: 202 10*3/uL (ref 140–400)
RBC: 3.78 10*6/uL — ABNORMAL LOW (ref 4.20–5.82)
RDW: 14.6 % (ref 11.0–14.6)
WBC: 11.9 10*3/uL — AB (ref 4.0–10.3)
lymph#: 1.5 10*3/uL (ref 0.9–3.3)

## 2014-11-17 MED ORDER — DEXAMETHASONE SODIUM PHOSPHATE 10 MG/ML IJ SOLN
INTRAMUSCULAR | Status: AC
  Filled 2014-11-17: qty 1

## 2014-11-17 MED ORDER — ONDANSETRON 8 MG/50ML IVPB (CHCC)
8.0000 mg | Freq: Once | INTRAVENOUS | Status: AC
  Administered 2014-11-17: 8 mg via INTRAVENOUS

## 2014-11-17 MED ORDER — ONDANSETRON 8 MG/NS 50 ML IVPB
INTRAVENOUS | Status: AC
  Filled 2014-11-17: qty 8

## 2014-11-17 MED ORDER — DEXAMETHASONE SODIUM PHOSPHATE 10 MG/ML IJ SOLN
10.0000 mg | Freq: Once | INTRAMUSCULAR | Status: AC
  Administered 2014-11-17: 10 mg via INTRAVENOUS

## 2014-11-17 MED ORDER — SODIUM CHLORIDE 0.9 % IV SOLN
Freq: Once | INTRAVENOUS | Status: AC
  Administered 2014-11-17: 12:00:00 via INTRAVENOUS

## 2014-11-17 MED ORDER — SODIUM CHLORIDE 0.9 % IV SOLN
500.0000 mg/m2 | Freq: Once | INTRAVENOUS | Status: AC
  Administered 2014-11-17: 1150 mg via INTRAVENOUS
  Filled 2014-11-17: qty 46

## 2014-11-17 NOTE — Progress Notes (Signed)
[place] [phone]   Fax:(336) [place], MD [address] [place] [address]  DIAGNOSIS: Stage IIIB (T2b, N3, M0) non-small cell lung cancer, adenocarcinoma, diagnosed in June of 2015 with large left lower lobe lung mass in addition to ipsilateral and contralateral mediastinal lymphadenopathy.  PRIOR THERAPY: Systemic chemotherapy with carboplatin for AUC of 5 and Alimta 500 mg/M2 every 3 weeks but status post 6 cycles. Last dose was given 09/08/2014 with partial response.  CURRENT THERAPY: Maintenance chemotherapy with single agent Alimta 500 MG/M2 every 3 weeks. Status post one cycle. First dose 10/27/2014.  INTERVAL HISTORY: Tyler [name] 74 y.o. male returns to the clinic today for followup visit accompanied by his wife. He tolerated the first cycle of his maintenance chemotherapy with single agent Alimta fairly well with no significant complaints. He lost around 6 pounds since his last visit but the patient mentions that he eats very well. He continues to have baseline shortness of breath and currently on home oxygen. He denied having any significant chest pain, cough or hemoptysis. The patient denied having any significant vomiting, no fever or chills. He is here today to start cycle #2 of his maintenance chemotherapy.  MEDICAL HISTORY: Past Medical History  Diagnosis Date  . Allergic rhinitis   . BPH (benign prostatic hyperplasia)   . GERD (gastroesophageal reflux disease)   . COPD (chronic obstructive pulmonary disease)   . Shortness of breath   . Arthritis   . Enlarged prostate   . Hypertension     dr Margreta Journey little in [place]   pcp      dr Stanford Breed  cardiac md  . Mental disorder   . Constipation   . Chronic chest pain   . Pneumonia     hx  . Cancer Lung  . Lung cancer 04/11/14    lul carina lesion=Adenocarcinoma    ALLERGIES:  is allergic to ketorolac tromethamine; codeine; morphine and  related; tramadol; and tussionex pennkinetic er.  MEDICATIONS:  Current Outpatient Prescriptions  Medication Sig Dispense Refill  . albuterol (PROVENTIL HFA;VENTOLIN HFA) 108 (90 BASE) MCG/ACT inhaler Inhale 2 puffs into the lungs every 6 (six) hours as needed for wheezing or shortness of breath.    Marland Kitchen albuterol (PROVENTIL) (2.5 MG/3ML) 0.083% nebulizer solution Take 3 mLs (2.5 mg total) by nebulization every 6 (six) hours as needed for wheezing. 75 mL 3  . dexamethasone (DECADRON) 4 MG tablet Take 1 tablet by mouth twice a day, the day before, the day of and the day after chemotherapy. Take with food 30 tablet 1  . escitalopram (LEXAPRO) 20 MG tablet Take 20 mg by mouth daily.   12  . flurazepam (DALMANE) 30 MG capsule Take 30 mg by mouth at bedtime as needed for sleep.    . folic acid (FOLVITE) 1 MG tablet TAKE 1 TABLET (1 MG TOTAL) BY MOUTH DAILY. 30 tablet 1  . naproxen (NAPROSYN) 250 MG tablet Take 250 mg by mouth 2 (two) times daily.    . nebivolol (BYSTOLIC) 10 MG tablet Take 10 mg by mouth 2 (two) times daily.     Marland Kitchen omeprazole (PRILOSEC) 20 MG capsule Take 20 mg by mouth 2 (two) times daily before a meal.     . ondansetron (ZOFRAN) 8 MG tablet Take 1 tablet (8 mg total) by mouth every 8 (eight) hours as needed for nausea or vomiting. 20 tablet 1  . oxyCODONE (ROXICODONE)  15 MG immediate release tablet Take 15 mg by mouth every 4 (four) hours as needed for pain.    Marland Kitchen oxyCODONE-acetaminophen (PERCOCET/ROXICET) 5-325 MG per tablet Take 1-2 tablets by mouth every 6 (six) hours as needed for severe pain. 60 tablet 0  . PRESCRIPTION MEDICATION Chemotherapy at [facility] (Dr. Julien Nordmann).  Last dose Carboplatin / Alimta was 09/08/14.  Planning to start single agent Alimta every 3 weeks beginning 10/27/14.    Marland Kitchen prochlorperazine (COMPAZINE) 10 MG tablet Take 1 tablet (10 mg total) by mouth every 6 (six) hours as needed for nausea or vomiting. 30 tablet 1  . Tamsulosin HCl (FLOMAX) 0.4 MG CAPS Take 0.4 mg by  mouth every morning.      No current facility-administered medications for this visit.    SURGICAL HISTORY:  Past Surgical History  Procedure Laterality Date  . Ankle surgery  2005  . Prostate surgery  11/25/2010  . Hernia repair    . Mediastinoscopy  06/11/2012    Procedure: MEDIASTINOSCOPY;  Surgeon: Grace Isaac, MD;  Location: [place];  Service: Thoracic;  Laterality: N/A;  . Inguinal hernia repair      left  . Eye surgery      cataract right  . Video bronchoscopy with endobronchial ultrasound  10/08/2012    Procedure: VIDEO BRONCHOSCOPY WITH ENDOBRONCHIAL ULTRASOUND;  Surgeon: Collene Gobble, MD;  Location: [place];  Service: Pulmonary;  Laterality: N/A;  . Video bronchoscopy Bilateral 04/11/2014    Procedure: VIDEO BRONCHOSCOPY WITHOUT FLUORO;  Surgeon: Collene Gobble, MD;  Location: [facility];  Service: Cardiopulmonary;  Laterality: Bilateral;    REVIEW OF SYSTEMS:  Constitutional: positive for fatigue Eyes: negative Ears, nose, mouth, throat, and face: negative Respiratory: positive for dyspnea on exertion Cardiovascular: negative Gastrointestinal: negative Genitourinary:negative Integument/breast: negative Hematologic/lymphatic: negative Musculoskeletal:negative Neurological: negative Behavioral/Psych: negative Endocrine: negative Allergic/Immunologic: negative   PHYSICAL EXAMINATION: General appearance: alert, cooperative and no distress Head: Normocephalic, without obvious abnormality, atraumatic Neck: no adenopathy, no JVD, supple, symmetrical, trachea midline and thyroid not enlarged, symmetric, no tenderness/mass/nodules Lymph nodes: Cervical, supraclavicular, and axillary nodes normal. Resp: wheezes bilaterally Back: symmetric, no curvature. ROM normal. No CVA tenderness. Cardio: regular rate and rhythm, S1, S2 normal, no murmur, click, rub or gallop GI: soft, non-tender; bowel sounds normal; no masses,  no organomegaly Extremities: extremities normal,  atraumatic, no cyanosis or edema Neurologic: Alert and oriented X 3, normal strength and tone. Normal symmetric reflexes. Normal coordination and gait  ECOG PERFORMANCE STATUS: 1 - Symptomatic but completely ambulatory  Blood pressure 109/61, pulse 67, temperature 0 F (-17.8 C), resp. rate 17, height 6\' 2"  (1.88 m), weight 216 lb 6.4 oz (98.158 kg), SpO2 97 %.  LABORATORY DATA: Lab Results  Component Value Date   WBC 11.9* 11/17/2014   HGB 12.8* 11/17/2014   HCT 38.7 11/17/2014   MCV 102.5* 11/17/2014   PLT 202 11/17/2014      Chemistry      Component Value Date/Time   NA 137 11/02/2014 1516   NA 141 10/27/2014 1125   K 3.6 11/02/2014 1516   K 4.4 10/27/2014 1125   CL 101 11/02/2014 1516   CO2 27 11/02/2014 1516   CO2 29 10/27/2014 1125   BUN 32* 11/02/2014 1516   BUN 25.5 10/27/2014 1125   CREATININE 1.16 11/02/2014 1516   CREATININE 1.2 10/27/2014 1125      Component Value Date/Time   CALCIUM 8.9 11/02/2014 1516   CALCIUM 9.7 10/27/2014 1125   ALKPHOS 103  10/27/2014 1125   ALKPHOS 118* 11/08/2013 1524   AST 19 10/27/2014 1125   AST 20 11/08/2013 1524   ALT 20 10/27/2014 1125   ALT 19 11/08/2013 1524   BILITOT 0.42 10/27/2014 1125   BILITOT 0.6 11/08/2013 1524       RADIOGRAPHIC STUDIES:  ASSESSMENT AND PLAN: This is a very pleasant 74 years old white male recently diagnosed with a stage IIIB non-small cell lung cancer, adenocarcinoma with large left lower lobe lung mass in addition to ipsilateral and contralateral mediastinal lymphadenopathy. He was not a candidate for concurrent radiotherapy because of the large volume of treatment area.  The patient underwent a course of systemic chemotherapy with carboplatin and Alimta status post 6 cycles. He tolerated his treatment fairly well and the recent CT scan of the chest showed further improvement in his disease. He is currently undergoing maintenance chemotherapy with single agent Alimta and tolerated the first  cycle of his treatment well. We will proceed with cycle #2 today as scheduled. For pain management he will continue on Percocet on as-needed basis. The patient would come back for followup visit in 3 weeks for evaluation with the start of cycle #3. He was advised to call immediately if he has any concerning symptoms in the interval.  The patient voices understanding of current disease status and treatment options and is in agreement with the current care plan.  All questions were answered. The patient knows to call the clinic with any problems, questions or concerns. We can certainly see the patient much sooner if necessary.  Disclaimer: This note was dictated with voice recognition software. Similar sounding words can inadvertently be transcribed and may be missed upon review.

## 2014-11-17 NOTE — Patient Instructions (Signed)
[place] Discharge Instructions for Patients Receiving Chemotherapy  Today you received the following chemotherapy agents Alimta.  To help prevent nausea and vomiting after your treatment, we encourage you to take your nausea medication as prescribed.   If you develop nausea and vomiting that is not controlled by your nausea medication, call the clinic.   BELOW ARE SYMPTOMS THAT SHOULD BE REPORTED IMMEDIATELY:  *FEVER GREATER THAN 100.5 F  *CHILLS WITH OR WITHOUT FEVER  NAUSEA AND VOMITING THAT IS NOT CONTROLLED WITH YOUR NAUSEA MEDICATION  *UNUSUAL SHORTNESS OF BREATH  *UNUSUAL BRUISING OR BLEEDING  TENDERNESS IN MOUTH AND THROAT WITH OR WITHOUT PRESENCE OF ULCERS  *URINARY PROBLEMS  *BOWEL PROBLEMS  UNUSUAL RASH Items with * indicate a potential emergency and should be followed up as soon as possible.  Feel free to call the clinic you have any questions or concerns. The clinic phone number is [phone].

## 2014-11-17 NOTE — Telephone Encounter (Signed)
Gave avs & cal for Feb. °

## 2014-11-19 ENCOUNTER — Other Ambulatory Visit: Payer: Self-pay

## 2014-11-19 DIAGNOSIS — C3432 Malignant neoplasm of lower lobe, left bronchus or lung: Secondary | ICD-10-CM

## 2014-11-19 MED ORDER — FOLIC ACID 1 MG PO TABS: 1.0000 mg | ORAL_TABLET | Freq: Every day | ORAL | Status: DC

## 2014-12-08 ENCOUNTER — Other Ambulatory Visit (HOSPITAL_BASED_OUTPATIENT_CLINIC_OR_DEPARTMENT_OTHER): Payer: Medicare Other

## 2014-12-08 ENCOUNTER — Ambulatory Visit (HOSPITAL_BASED_OUTPATIENT_CLINIC_OR_DEPARTMENT_OTHER): Payer: Medicare Other | Admitting: Physician Assistant

## 2014-12-08 ENCOUNTER — Telehealth: Payer: Self-pay | Admitting: *Deleted

## 2014-12-08 ENCOUNTER — Encounter: Payer: Self-pay | Admitting: Physician Assistant

## 2014-12-08 ENCOUNTER — Ambulatory Visit (HOSPITAL_BASED_OUTPATIENT_CLINIC_OR_DEPARTMENT_OTHER): Payer: Medicare Other

## 2014-12-08 ENCOUNTER — Telehealth: Payer: Self-pay | Admitting: Physician Assistant

## 2014-12-08 VITALS — BP 152/58 | HR 69 | Temp 97.6°F | Resp 21 | Ht 74.0 in | Wt 226.9 lb

## 2014-12-08 DIAGNOSIS — C3402 Malignant neoplasm of left main bronchus: Secondary | ICD-10-CM

## 2014-12-08 DIAGNOSIS — C3432 Malignant neoplasm of lower lobe, left bronchus or lung: Secondary | ICD-10-CM

## 2014-12-08 DIAGNOSIS — Z5111 Encounter for antineoplastic chemotherapy: Secondary | ICD-10-CM

## 2014-12-08 DIAGNOSIS — R079 Chest pain, unspecified: Secondary | ICD-10-CM

## 2014-12-08 DIAGNOSIS — C3430 Malignant neoplasm of lower lobe, unspecified bronchus or lung: Secondary | ICD-10-CM

## 2014-12-08 LAB — COMPREHENSIVE METABOLIC PANEL (CC13)
ALT: 24 U/L (ref 0–55)
AST: 23 U/L (ref 5–34)
Albumin: 3.7 g/dL (ref 3.5–5.0)
Alkaline Phosphatase: 107 U/L (ref 40–150)
Anion Gap: 11 mEq/L (ref 3–11)
BUN: 20.6 mg/dL (ref 7.0–26.0)
CALCIUM: 9.6 mg/dL (ref 8.4–10.4)
CO2: 25 mEq/L (ref 22–29)
Chloride: 104 mEq/L (ref 98–109)
Creatinine: 0.9 mg/dL (ref 0.7–1.3)
EGFR: 81 mL/min/{1.73_m2} — AB (ref >90)
GLUCOSE: 222 mg/dL — AB (ref 70–140)
Potassium: 4.1 mEq/L (ref 3.5–5.1)
Sodium: 139 mEq/L (ref 136–145)
TOTAL PROTEIN: 7 g/dL (ref 6.4–8.3)
Total Bilirubin: 0.21 mg/dL (ref 0.20–1.20)

## 2014-12-08 LAB — CBC WITH DIFFERENTIAL/PLATELET
BASO%: 0.2 % (ref 0.0–2.0)
Basophils Absolute: 0 10*3/uL (ref 0.0–0.1)
EOS%: 0 % (ref 0.0–7.0)
Eosinophils Absolute: 0 10*3/uL (ref 0.0–0.5)
HCT: 33.8 % — ABNORMAL LOW (ref 38.4–49.9)
HGB: 11.4 g/dL — ABNORMAL LOW (ref 13.0–17.1)
LYMPH%: 12.3 % — ABNORMAL LOW (ref 14.0–49.0)
MCH: 34.1 pg — ABNORMAL HIGH (ref 27.2–33.4)
MCHC: 33.7 g/dL (ref 32.0–36.0)
MCV: 101.3 fL — ABNORMAL HIGH (ref 79.3–98.0)
MONO#: 0.3 10*3/uL (ref 0.1–0.9)
MONO%: 4.6 % (ref 0.0–14.0)
NEUT#: 5.9 10*3/uL (ref 1.5–6.5)
NEUT%: 82.9 % — ABNORMAL HIGH (ref 39.0–75.0)
Platelets: 251 10*3/uL (ref 140–400)
RBC: 3.34 10*6/uL — ABNORMAL LOW (ref 4.20–5.82)
RDW: 14.4 % (ref 11.0–14.6)
WBC: 7.1 10*3/uL (ref 4.0–10.3)
lymph#: 0.9 10*3/uL (ref 0.9–3.3)

## 2014-12-08 MED ORDER — ONDANSETRON HCL 8 MG PO TABS: 8.0000 mg | ORAL_TABLET | Freq: Three times a day (TID) | ORAL | Status: DC | PRN

## 2014-12-08 MED ORDER — CYANOCOBALAMIN 1000 MCG/ML IJ SOLN
1000.0000 ug | Freq: Once | INTRAMUSCULAR | Status: AC
  Administered 2014-12-08: 1000 ug via INTRAMUSCULAR

## 2014-12-08 MED ORDER — CYANOCOBALAMIN 1000 MCG/ML IJ SOLN
INTRAMUSCULAR | Status: AC
  Filled 2014-12-08: qty 1

## 2014-12-08 MED ORDER — OXYCODONE HCL 15 MG PO TABS: 15.0000 mg | ORAL_TABLET | ORAL | Status: DC | PRN

## 2014-12-08 MED ORDER — ONDANSETRON 8 MG/50ML IVPB (CHCC)
8.0000 mg | Freq: Once | INTRAVENOUS | Status: AC
  Administered 2014-12-08: 8 mg via INTRAVENOUS

## 2014-12-08 MED ORDER — SODIUM CHLORIDE 0.9 % IV SOLN
500.0000 mg/m2 | Freq: Once | INTRAVENOUS | Status: AC
  Administered 2014-12-08: 1150 mg via INTRAVENOUS
  Filled 2014-12-08: qty 46

## 2014-12-08 MED ORDER — SODIUM CHLORIDE 0.9 % IV SOLN
Freq: Once | INTRAVENOUS | Status: AC
  Administered 2014-12-08: 12:00:00 via INTRAVENOUS

## 2014-12-08 MED ORDER — ONDANSETRON 8 MG/NS 50 ML IVPB
INTRAVENOUS | Status: AC
  Filled 2014-12-08: qty 8

## 2014-12-08 MED ORDER — DEXAMETHASONE SODIUM PHOSPHATE 10 MG/ML IJ SOLN
10.0000 mg | Freq: Once | INTRAMUSCULAR | Status: AC
  Administered 2014-12-08: 10 mg via INTRAVENOUS

## 2014-12-08 MED ORDER — DEXAMETHASONE SODIUM PHOSPHATE 10 MG/ML IJ SOLN
INTRAMUSCULAR | Status: AC
  Filled 2014-12-08: qty 1

## 2014-12-08 MED ORDER — PROCHLORPERAZINE MALEATE 10 MG PO TABS: 10.0000 mg | ORAL_TABLET | Freq: Four times a day (QID) | ORAL | Status: DC | PRN

## 2014-12-08 NOTE — Telephone Encounter (Signed)
Gave avs & calendar for February. Sent message to schedule treatment. °

## 2014-12-08 NOTE — Patient Instructions (Signed)
Follow up in 3 weeks with a restaging CT scan of your chest to re-evaluate your disease

## 2014-12-08 NOTE — Telephone Encounter (Signed)
Per staff message and POF I have scheduled appts. Advised scheduler of appts. JMW  

## 2014-12-08 NOTE — Patient Instructions (Signed)
[place] Discharge Instructions for Patients Receiving Chemotherapy  Today you received the following chemotherapy agents alimta  To help prevent nausea and vomiting after your treatment, we encourage you to take your nausea medication as directed   If you develop nausea and vomiting that is not controlled by your nausea medication, call the clinic.   BELOW ARE SYMPTOMS THAT SHOULD BE REPORTED IMMEDIATELY:  *FEVER GREATER THAN 100.5 F  *CHILLS WITH OR WITHOUT FEVER  NAUSEA AND VOMITING THAT IS NOT CONTROLLED WITH YOUR NAUSEA MEDICATION  *UNUSUAL SHORTNESS OF BREATH  *UNUSUAL BRUISING OR BLEEDING  TENDERNESS IN MOUTH AND THROAT WITH OR WITHOUT PRESENCE OF ULCERS  *URINARY PROBLEMS  *BOWEL PROBLEMS  UNUSUAL RASH Items with * indicate a potential emergency and should be followed up as soon as possible.  Feel free to call the clinic you have any questions or concerns. The clinic phone number is [phone].

## 2014-12-08 NOTE — Progress Notes (Signed)
[name] Telephone:([phone]   Fax:(336) [place], MD [address] [place] [address]  DIAGNOSIS: Stage IIIB (T2b, N3, M0) non-small cell lung cancer, adenocarcinoma, diagnosed in June of 2015 with large left lower lobe lung mass in addition to ipsilateral and contralateral mediastinal lymphadenopathy.  PRIOR THERAPY: Systemic chemotherapy with carboplatin for AUC of 5 and Alimta 500 mg/M2 every 3 weeks but status post 6 cycles. Last dose was given 09/08/2014 with partial response.  CURRENT THERAPY: Maintenance chemotherapy with single agent Alimta 500 MG/M2 every 3 weeks. Status post 2 cycles. First dose 10/27/2014.  INTERVAL HISTORY: Tyler [name] 74 y.o. male returns to the clinic today for followup visit accompanied by his wife. He is tolerating his maintenance chemotherapy with single agent Alimta fairly well with no significant complaints. He continues to complain of left sided chest pain/discomfort. He describes it as non-radiating, not associated with nausea, vomiting or diaphoresis. He takes his oxycodone 15 mg as needed for relief. He requests a refill for the oxycodone tablets.  He continues to have baseline shortness of breath.  He denied having any significant chest pain, cough or hemoptysis. The patient denied having any significant vomiting, no fever or chills. He is here today to start cycle #3 of his maintenance chemotherapy.  MEDICAL HISTORY: Past Medical History  Diagnosis Date  . Allergic rhinitis   . BPH (benign prostatic hyperplasia)   . GERD (gastroesophageal reflux disease)   . COPD (chronic obstructive pulmonary disease)   . Shortness of breath   . Arthritis   . Enlarged prostate   . Hypertension     dr Margreta Journey little in [place]   pcp      dr Stanford Breed  cardiac md  . Mental disorder   . Constipation   . Chronic chest pain   . Pneumonia     hx  . Cancer Lung  . Lung cancer 04/11/14    lul  carina lesion=Adenocarcinoma    ALLERGIES:  is allergic to ketorolac tromethamine; codeine; morphine and related; tramadol; and tussionex pennkinetic er.  MEDICATIONS:  Current Outpatient Prescriptions  Medication Sig Dispense Refill  . albuterol (PROVENTIL HFA;VENTOLIN HFA) 108 (90 BASE) MCG/ACT inhaler Inhale 2 puffs into the lungs every 6 (six) hours as needed for wheezing or shortness of breath.    Marland Kitchen albuterol (PROVENTIL) (2.5 MG/3ML) 0.083% nebulizer solution Take 3 mLs (2.5 mg total) by nebulization every 6 (six) hours as needed for wheezing. 75 mL 3  . dexamethasone (DECADRON) 4 MG tablet Take 1 tablet by mouth twice a day, the day before, the day of and the day after chemotherapy. Take with food 30 tablet 1  . escitalopram (LEXAPRO) 20 MG tablet Take 20 mg by mouth daily.   12  . flurazepam (DALMANE) 30 MG capsule Take 30 mg by mouth at bedtime as needed for sleep.    . folic acid (FOLVITE) 1 MG tablet Take 1 tablet (1 mg total) by mouth daily. 30 tablet 1  . naproxen (NAPROSYN) 250 MG tablet Take 250 mg by mouth 2 (two) times daily.    . nebivolol (BYSTOLIC) 10 MG tablet Take 10 mg by mouth 2 (two) times daily.     Marland Kitchen omeprazole (PRILOSEC) 20 MG capsule Take 20 mg by mouth 2 (two) times daily before a meal.     . ondansetron (ZOFRAN) 8 MG tablet Take 1 tablet (8 mg total) by mouth every 8 (  eight) hours as needed for nausea or vomiting. 20 tablet 1  . oxyCODONE (ROXICODONE) 15 MG immediate release tablet Take 1 tablet (15 mg total) by mouth every 4 (four) hours as needed for pain. 30 tablet 0  . Tamsulosin HCl (FLOMAX) 0.4 MG CAPS Take 0.4 mg by mouth every morning.     Marland Kitchen PRESCRIPTION MEDICATION Chemotherapy at [facility] (Dr. Julien Nordmann).  Last dose Carboplatin / Alimta was 09/08/14.  Planning to start single agent Alimta every 3 weeks beginning 10/27/14.    Marland Kitchen prochlorperazine (COMPAZINE) 10 MG tablet Take 1 tablet (10 mg total) by mouth every 6 (six) hours as needed for nausea or vomiting. 30  tablet 1   No current facility-administered medications for this visit.    SURGICAL HISTORY:  Past Surgical History  Procedure Laterality Date  . Ankle surgery  2005  . Prostate surgery  11/25/2010  . Hernia repair    . Mediastinoscopy  06/11/2012    Procedure: MEDIASTINOSCOPY;  Surgeon: Grace Isaac, MD;  Location: [place];  Service: Thoracic;  Laterality: N/A;  . Inguinal hernia repair      left  . Eye surgery      cataract right  . Video bronchoscopy with endobronchial ultrasound  10/08/2012    Procedure: VIDEO BRONCHOSCOPY WITH ENDOBRONCHIAL ULTRASOUND;  Surgeon: Collene Gobble, MD;  Location: [place];  Service: Pulmonary;  Laterality: N/A;  . Video bronchoscopy Bilateral 04/11/2014    Procedure: VIDEO BRONCHOSCOPY WITHOUT FLUORO;  Surgeon: Collene Gobble, MD;  Location: [facility];  Service: Cardiopulmonary;  Laterality: Bilateral;    REVIEW OF SYSTEMS:  Constitutional: positive for fatigue Eyes: negative Ears, nose, mouth, throat, and face: negative Respiratory: positive for dyspnea on exertion Cardiovascular: negative Gastrointestinal: negative Genitourinary:negative Integument/breast: negative Hematologic/lymphatic: negative Musculoskeletal:positive for bone pain and left chest wall Neurological: negative Behavioral/Psych: negative Endocrine: negative Allergic/Immunologic: negative   PHYSICAL EXAMINATION: General appearance: alert, cooperative and no distress Head: Normocephalic, without obvious abnormality, atraumatic Neck: no adenopathy, no JVD, supple, symmetrical, trachea midline and thyroid not enlarged, symmetric, no tenderness/mass/nodules Lymph nodes: Cervical, supraclavicular, and axillary nodes normal. Resp: wheezes bilaterally and minimal and point tenderness left mid anterior ribcage Back: symmetric, no curvature. ROM normal. No CVA tenderness. Cardio: regular rate and rhythm, S1, S2 normal, no murmur, click, rub or gallop GI: soft, non-tender; bowel  sounds normal; no masses,  no organomegaly Extremities: extremities normal, atraumatic, no cyanosis or edema Neurologic: Alert and oriented X 3, normal strength and tone. Normal symmetric reflexes. Normal coordination and gait  ECOG PERFORMANCE STATUS: 1 - Symptomatic but completely ambulatory  Blood pressure 152/58, pulse 69, temperature 97.6 F (36.4 C), temperature source Oral, resp. rate 21, height 6\' 2"  (1.88 m), weight 226 lb 14.4 oz (102.921 kg), SpO2 97 %.  LABORATORY DATA: Lab Results  Component Value Date   WBC 7.1 12/08/2014   HGB 11.4* 12/08/2014   HCT 33.8* 12/08/2014   MCV 101.3* 12/08/2014   PLT 251 12/08/2014      Chemistry      Component Value Date/Time   NA 139 12/08/2014 1022   NA 137 11/02/2014 1516   K 4.1 12/08/2014 1022   K 3.6 11/02/2014 1516   CL 101 11/02/2014 1516   CO2 25 12/08/2014 1022   CO2 27 11/02/2014 1516   BUN 20.6 12/08/2014 1022   BUN 32* 11/02/2014 1516   CREATININE 0.9 12/08/2014 1022   CREATININE 1.16 11/02/2014 1516      Component Value Date/Time   CALCIUM  9.6 12/08/2014 1022   CALCIUM 8.9 11/02/2014 1516   ALKPHOS 107 12/08/2014 1022   ALKPHOS 118* 11/08/2013 1524   AST 23 12/08/2014 1022   AST 20 11/08/2013 1524   ALT 24 12/08/2014 1022   ALT 19 11/08/2013 1524   BILITOT 0.21 12/08/2014 1022   BILITOT 0.6 11/08/2013 1524       RADIOGRAPHIC STUDIES:  ASSESSMENT AND PLAN: This is a very pleasant 74 years old white male recently diagnosed with a stage IIIB non-small cell lung cancer, adenocarcinoma with large left lower lobe lung mass in addition to ipsilateral and contralateral mediastinal lymphadenopathy. He was not a candidate for concurrent radiotherapy because of the large volume of treatment area.  The patient underwent a course of systemic chemotherapy with carboplatin and Alimta status post 6 cycles. He tolerated his treatment fairly well and the recent CT scan of the chest showed further improvement in his  disease. He is currently undergoing maintenance chemotherapy with single agent Alimta. He is status post 2 cycles. The patient was discussed with and also seen by Dr. Julien Nordmann. He will proceed with cycle #3 today as scheduled. For pain management he will continue on oxycodone on as-needed basis and he was given a refill prescription today.Marland Kitchen He will return in 3 weeks prior to cycle #4 with a restaging CT scan of the chest with contrast to re-evaluate his disease. He was advised to call immediately if he has any concerning symptoms in the interval.  The patient voices understanding of current disease status and treatment options and is in agreement with the current care plan.  All questions were answered. The patient knows to call the clinic with any problems, questions or concerns. We can certainly see the patient much sooner if necessary.  Carlton Adam, PA-C 12/08/2014  ADDENDUM: Hematology/Oncology Attending: I had a face to face encounter with the patient today. I recommended his care plan. This is a very pleasant 74 years old white male with a stage IIIB non-small cell lung cancer status post induction chemotherapy with carboplatin and Alimta and he is currently undergoing maintenance treatment with single agent Alimta status post 2 cycles. The patient is feeling fine today was no specific complaints except for intermittent reproducible left sided chest pain most likely secondary to musculoskeletal pain. He takes oxycodone on as-needed basis for pain management. I recommended for the patient to continue his treatment with maintenance Alimta as a scheduled. He would come back for follow-up visit in 3 weeks for reevaluation after repeating CT scan of the chest for restaging of his disease. He was advised to call immediately if he has any concerning symptoms in the interval.  Disclaimer: This note was dictated with voice recognition software. Similar sounding words can inadvertently be  transcribed and may be missed upon review. Eilleen Kempf., MD 12/08/2014

## 2014-12-26 ENCOUNTER — Ambulatory Visit (HOSPITAL_COMMUNITY)
Admission: RE | Admit: 2014-12-26 | Discharge: 2014-12-26 | Disposition: A | Discharge status: Home / Self Care | Payer: Medicare Other | Source: Ambulatory Visit | Attending: Physician Assistant | Admitting: Physician Assistant

## 2014-12-26 ENCOUNTER — Encounter (HOSPITAL_COMMUNITY): Payer: Self-pay

## 2014-12-26 DIAGNOSIS — Z08 Encounter for follow-up examination after completed treatment for malignant neoplasm: Secondary | ICD-10-CM | POA: Diagnosis present

## 2014-12-26 DIAGNOSIS — R0602 Shortness of breath: Secondary | ICD-10-CM | POA: Diagnosis not present

## 2014-12-26 DIAGNOSIS — C3432 Malignant neoplasm of lower lobe, left bronchus or lung: Secondary | ICD-10-CM | POA: Insufficient documentation

## 2014-12-26 DIAGNOSIS — Z79899 Other long term (current) drug therapy: Secondary | ICD-10-CM | POA: Insufficient documentation

## 2014-12-26 DIAGNOSIS — C3430 Malignant neoplasm of lower lobe, unspecified bronchus or lung: Secondary | ICD-10-CM

## 2014-12-26 DIAGNOSIS — R079 Chest pain, unspecified: Secondary | ICD-10-CM | POA: Insufficient documentation

## 2014-12-26 MED ORDER — IOHEXOL 300 MG/ML  SOLN
80.0000 mL | Freq: Once | INTRAMUSCULAR | Status: AC | PRN
  Administered 2014-12-26: 80 mL via INTRAVENOUS

## 2014-12-29 ENCOUNTER — Other Ambulatory Visit (HOSPITAL_BASED_OUTPATIENT_CLINIC_OR_DEPARTMENT_OTHER): Payer: Medicare Other

## 2014-12-29 ENCOUNTER — Encounter: Payer: Self-pay | Admitting: *Deleted

## 2014-12-29 ENCOUNTER — Telehealth: Payer: Self-pay | Admitting: Internal Medicine

## 2014-12-29 ENCOUNTER — Ambulatory Visit (HOSPITAL_BASED_OUTPATIENT_CLINIC_OR_DEPARTMENT_OTHER): Payer: Medicare Other | Admitting: Internal Medicine

## 2014-12-29 ENCOUNTER — Ambulatory Visit (HOSPITAL_BASED_OUTPATIENT_CLINIC_OR_DEPARTMENT_OTHER): Payer: Medicare Other

## 2014-12-29 ENCOUNTER — Encounter: Payer: Self-pay | Admitting: Internal Medicine

## 2014-12-29 VITALS — BP 133/69 | HR 62 | Temp 97.9°F | Resp 19 | Ht 74.0 in | Wt 228.2 lb

## 2014-12-29 DIAGNOSIS — C3402 Malignant neoplasm of left main bronchus: Secondary | ICD-10-CM

## 2014-12-29 DIAGNOSIS — C3432 Malignant neoplasm of lower lobe, left bronchus or lung: Secondary | ICD-10-CM

## 2014-12-29 DIAGNOSIS — C778 Secondary and unspecified malignant neoplasm of lymph nodes of multiple regions: Secondary | ICD-10-CM

## 2014-12-29 DIAGNOSIS — Z5111 Encounter for antineoplastic chemotherapy: Secondary | ICD-10-CM

## 2014-12-29 LAB — CBC WITH DIFFERENTIAL/PLATELET
BASO%: 0.7 % (ref 0.0–2.0)
Basophils Absolute: 0 10*3/uL (ref 0.0–0.1)
EOS%: 1.9 % (ref 0.0–7.0)
Eosinophils Absolute: 0.1 10*3/uL (ref 0.0–0.5)
HCT: 34 % — ABNORMAL LOW (ref 38.4–49.9)
HGB: 11.3 g/dL — ABNORMAL LOW (ref 13.0–17.1)
LYMPH%: 26.2 % (ref 14.0–49.0)
MCH: 33.7 pg — ABNORMAL HIGH (ref 27.2–33.4)
MCHC: 33.2 g/dL (ref 32.0–36.0)
MCV: 101.8 fL — ABNORMAL HIGH (ref 79.3–98.0)
MONO#: 0.7 10*3/uL (ref 0.1–0.9)
MONO%: 12.4 % (ref 0.0–14.0)
NEUT#: 3.4 10*3/uL (ref 1.5–6.5)
NEUT%: 58.8 % (ref 39.0–75.0)
Platelets: 165 10*3/uL (ref 140–400)
RBC: 3.34 10*6/uL — ABNORMAL LOW (ref 4.20–5.82)
RDW: 14.6 % (ref 11.0–14.6)
WBC: 5.8 10*3/uL (ref 4.0–10.3)
lymph#: 1.5 10*3/uL (ref 0.9–3.3)

## 2014-12-29 LAB — COMPREHENSIVE METABOLIC PANEL (CC13)
ALT: 18 U/L (ref 0–55)
AST: 19 U/L (ref 5–34)
Albumin: 2.9 g/dL — ABNORMAL LOW (ref 3.5–5.0)
Alkaline Phosphatase: 94 U/L (ref 40–150)
Anion Gap: 9 mEq/L (ref 3–11)
BUN: 16.3 mg/dL (ref 7.0–26.0)
CO2: 27 mEq/L (ref 22–29)
Calcium: 9.3 mg/dL (ref 8.4–10.4)
Chloride: 106 mEq/L (ref 98–109)
Creatinine: 0.9 mg/dL (ref 0.7–1.3)
EGFR: 87 mL/min/{1.73_m2} — ABNORMAL LOW (ref >90)
Glucose: 100 mg/dl (ref 70–140)
Potassium: 3.9 mEq/L (ref 3.5–5.1)
Sodium: 142 mEq/L (ref 136–145)
Total Bilirubin: 0.32 mg/dL (ref 0.20–1.20)
Total Protein: 6.3 g/dL — ABNORMAL LOW (ref 6.4–8.3)

## 2014-12-29 MED ORDER — ONDANSETRON 8 MG/NS 50 ML IVPB
INTRAVENOUS | Status: AC
  Filled 2014-12-29: qty 8

## 2014-12-29 MED ORDER — SODIUM CHLORIDE 0.9 % IV SOLN
Freq: Once | INTRAVENOUS | Status: AC
  Administered 2014-12-29: 11:00:00 via INTRAVENOUS

## 2014-12-29 MED ORDER — OXYCODONE-ACETAMINOPHEN 5-325 MG PO TABS
ORAL_TABLET | ORAL | Status: AC
  Filled 2014-12-29: qty 2

## 2014-12-29 MED ORDER — DEXAMETHASONE SODIUM PHOSPHATE 10 MG/ML IJ SOLN
10.0000 mg | Freq: Once | INTRAMUSCULAR | Status: AC
  Administered 2014-12-29: 10 mg via INTRAVENOUS

## 2014-12-29 MED ORDER — DEXAMETHASONE SODIUM PHOSPHATE 10 MG/ML IJ SOLN
INTRAMUSCULAR | Status: AC
  Filled 2014-12-29: qty 1

## 2014-12-29 MED ORDER — SODIUM CHLORIDE 0.9 % IV SOLN
470.0000 mg/m2 | Freq: Once | INTRAVENOUS | Status: AC
  Administered 2014-12-29: 1100 mg via INTRAVENOUS
  Filled 2014-12-29: qty 44

## 2014-12-29 MED ORDER — OXYCODONE-ACETAMINOPHEN 5-325 MG PO TABS
2.0000 | ORAL_TABLET | Freq: Once | ORAL | Status: AC
  Administered 2014-12-29: 2 via ORAL

## 2014-12-29 MED ORDER — ONDANSETRON 8 MG/50ML IVPB (CHCC)
8.0000 mg | Freq: Once | INTRAVENOUS | Status: AC
  Administered 2014-12-29: 8 mg via INTRAVENOUS

## 2014-12-29 NOTE — Patient Instructions (Signed)
[place] Discharge Instructions for Patients Receiving Chemotherapy  Today you received the following chemotherapy agents: Alimta.  To help prevent nausea and vomiting after your treatment, we encourage you to take your nausea medication: Compazine 10 mg every 6 hours as needed and Zpfran 8 mg every 8 hours as needed.   If you develop nausea and vomiting that is not controlled by your nausea medication, call the clinic.   BELOW ARE SYMPTOMS THAT SHOULD BE REPORTED IMMEDIATELY:  *FEVER GREATER THAN 100.5 F  *CHILLS WITH OR WITHOUT FEVER  NAUSEA AND VOMITING THAT IS NOT CONTROLLED WITH YOUR NAUSEA MEDICATION  *UNUSUAL SHORTNESS OF BREATH  *UNUSUAL BRUISING OR BLEEDING  TENDERNESS IN MOUTH AND THROAT WITH OR WITHOUT PRESENCE OF ULCERS  *URINARY PROBLEMS  *BOWEL PROBLEMS  UNUSUAL RASH Items with * indicate a potential emergency and should be followed up as soon as possible.  Feel free to call the clinic you have any questions or concerns. The clinic phone number is [phone].

## 2014-12-29 NOTE — [facility] Oncology Navigator Note (Unsigned)
Spoke with patient today at [facility].  He stated he was having back pain.  Patient was assessed by doctor Julien Nordmann.  Patient will be given pain medication during his treatment.  Other than patient's pain, he stated he was doing well.

## 2014-12-29 NOTE — Progress Notes (Signed)
[facility] Telephone:([phone]) [phone]   Fax:(336) [place], MD [address] [place] [address]  DIAGNOSIS: Stage IIIB (T2b, N3, M0) non-small cell lung cancer, adenocarcinoma, diagnosed in June of 2015 with large left lower lobe lung mass in addition to ipsilateral and contralateral mediastinal lymphadenopathy.  PRIOR THERAPY: Systemic chemotherapy with carboplatin for AUC of 5 and Alimta 500 mg/M2 every 3 weeks but status post 6 cycles. Last dose was given 09/08/2014 with partial response.  CURRENT THERAPY: Maintenance chemotherapy with single agent Alimta 500 MG/M2 every 3 weeks. Status post 3 cycles. First dose 10/27/2014.  INTERVAL HISTORY: Tyler [name] 74 y.o. male returns to the clinic today for followup visit accompanied by his wife. He tolerated the last cycle of his maintenance chemotherapy with single agent Alimta fairly well with no significant complaints. He has been complaining of low back pain with radiation to the left iliac crest. He is currently on pain medication with Percocet but he did not take his medication earlier today. He denied having any significant chest pain, shortness of breath, cough or hemoptysis. The patient denied having any significant vomiting, no fever or chills. He had repeat CT scan of the chest performed recently and he is here for evaluation and discussion of his scan results.  MEDICAL HISTORY: Past Medical History  Diagnosis Date  . Allergic rhinitis   . BPH (benign prostatic hyperplasia)   . GERD (gastroesophageal reflux disease)   . COPD (chronic obstructive pulmonary disease)   . Shortness of breath   . Arthritis   . Enlarged prostate   . Hypertension     dr Margreta Journey little in [place]   pcp      dr Stanford Breed  cardiac md  . Mental disorder   . Constipation   . Chronic chest pain   . Pneumonia     hx  . Cancer Lung  . Lung cancer 04/11/14    lul carina lesion=Adenocarcinoma     ALLERGIES:  is allergic to ketorolac tromethamine; codeine; morphine and related; tramadol; and tussionex pennkinetic er.  MEDICATIONS:  Current Outpatient Prescriptions  Medication Sig Dispense Refill  . albuterol (PROVENTIL) (2.5 MG/3ML) 0.083% nebulizer solution Take 3 mLs (2.5 mg total) by nebulization every 6 (six) hours as needed for wheezing. 75 mL 3  . LUMIGAN 0.01 % SOLN   1  . nebivolol (BYSTOLIC) 10 MG tablet Take 10 mg by mouth 2 (two) times daily.     Marland Kitchen omeprazole (PRILOSEC) 20 MG capsule Take 20 mg by mouth 2 (two) times daily before a meal.     . oxyCODONE (ROXICODONE) 15 MG immediate release tablet Take 1 tablet (15 mg total) by mouth every 4 (four) hours as needed for pain. 30 tablet 0  . PRESCRIPTION MEDICATION Chemotherapy at [facility] (Dr. Julien Nordmann).  Last dose Carboplatin / Alimta was 09/08/14.  Planning to start single agent Alimta every 3 weeks beginning 10/27/14.    . Tamsulosin HCl (FLOMAX) 0.4 MG CAPS Take 0.4 mg by mouth every morning.     Marland Kitchen albuterol (PROVENTIL HFA;VENTOLIN HFA) 108 (90 BASE) MCG/ACT inhaler Inhale 2 puffs into the lungs every 6 (six) hours as needed for wheezing or shortness of breath.    Marland Kitchen amitriptyline (ELAVIL) 10 MG tablet Take 10 mg by mouth at bedtime as needed.  2  . dexamethasone (DECADRON) 4 MG tablet Take 1 tablet by mouth twice a day, the day before, the day  of and the day after chemotherapy. Take with food (Patient not taking: Reported on 12/29/2014) 30 tablet 1  . escitalopram (LEXAPRO) 20 MG tablet Take 20 mg by mouth daily.   12  . flurazepam (DALMANE) 30 MG capsule Take 30 mg by mouth at bedtime as needed for sleep.    . folic acid (FOLVITE) 1 MG tablet Take 1 tablet (1 mg total) by mouth daily. (Patient not taking: Reported on 12/29/2014) 30 tablet 1  . ondansetron (ZOFRAN) 8 MG tablet Take 1 tablet (8 mg total) by mouth every 8 (eight) hours as needed for nausea or vomiting. (Patient not taking: Reported on 12/29/2014) 20 tablet 1  .  prochlorperazine (COMPAZINE) 10 MG tablet Take 1 tablet (10 mg total) by mouth every 6 (six) hours as needed for nausea or vomiting. (Patient not taking: Reported on 12/29/2014) 30 tablet 1   No current facility-administered medications for this visit.    SURGICAL HISTORY:  Past Surgical History  Procedure Laterality Date  . Ankle surgery  2005  . Prostate surgery  11/25/2010  . Hernia repair    . Mediastinoscopy  06/11/2012    Procedure: MEDIASTINOSCOPY;  Surgeon: Grace Isaac, MD;  Location: [facility];  Service: Thoracic;  Laterality: N/A;  . Inguinal hernia repair      left  . Eye surgery      cataract right  . Video bronchoscopy with endobronchial ultrasound  10/08/2012    Procedure: VIDEO BRONCHOSCOPY WITH ENDOBRONCHIAL ULTRASOUND;  Surgeon: Collene Gobble, MD;  Location: [facility];  Service: Pulmonary;  Laterality: N/A;  . Video bronchoscopy Bilateral 04/11/2014    Procedure: VIDEO BRONCHOSCOPY WITHOUT FLUORO;  Surgeon: Collene Gobble, MD;  Location: [facility];  Service: Cardiopulmonary;  Laterality: Bilateral;    REVIEW OF SYSTEMS:  Constitutional: positive for fatigue Eyes: negative Ears, nose, mouth, throat, and face: negative Respiratory: negative Cardiovascular: negative Gastrointestinal: negative Genitourinary:negative Integument/breast: negative Hematologic/lymphatic: negative Musculoskeletal:positive for back pain Neurological: negative Behavioral/Psych: negative Endocrine: negative Allergic/Immunologic: negative   PHYSICAL EXAMINATION: General appearance: alert, cooperative and no distress Head: Normocephalic, without obvious abnormality, atraumatic Neck: no adenopathy, no JVD, supple, symmetrical, trachea midline and thyroid not enlarged, symmetric, no tenderness/mass/nodules Lymph nodes: Cervical, supraclavicular, and axillary nodes normal. Resp: wheezes bilaterally Back: symmetric, no curvature. ROM normal. No CVA tenderness. Cardio: regular rate and rhythm,  S1, S2 normal, no murmur, click, rub or gallop GI: soft, non-tender; bowel sounds normal; no masses,  no organomegaly Extremities: extremities normal, atraumatic, no cyanosis or edema Neurologic: Alert and oriented X 3, normal strength and tone. Normal symmetric reflexes. Normal coordination and gait  ECOG PERFORMANCE STATUS: 1 - Symptomatic but completely ambulatory  Blood pressure 133/69, pulse 62, temperature 97.9 F (36.6 C), temperature source Oral, resp. rate 19, height 6\' 2"  (1.88 m), weight 228 lb 3.2 oz (103.511 kg), SpO2 96 %.  LABORATORY DATA: Lab Results  Component Value Date   WBC 5.8 12/29/2014   HGB 11.3* 12/29/2014   HCT 34.0* 12/29/2014   MCV 101.8* 12/29/2014   PLT 165 12/29/2014      Chemistry      Component Value Date/Time   NA 142 12/29/2014 0812   NA 137 11/02/2014 1516   K 3.9 12/29/2014 0812   K 3.6 11/02/2014 1516   CL 101 11/02/2014 1516   CO2 27 12/29/2014 0812   CO2 27 11/02/2014 1516   BUN 16.3 12/29/2014 0812   BUN 32* 11/02/2014 1516   CREATININE 0.9 12/29/2014 2542  CREATININE 1.16 11/02/2014 1516      Component Value Date/Time   CALCIUM 9.3 12/29/2014 0812   CALCIUM 8.9 11/02/2014 1516   ALKPHOS 94 12/29/2014 0812   ALKPHOS 118* 11/08/2013 1524   AST 19 12/29/2014 0812   AST 20 11/08/2013 1524   ALT 18 12/29/2014 0812   ALT 19 11/08/2013 1524   BILITOT 0.32 12/29/2014 0812   BILITOT 0.6 11/08/2013 1524       RADIOGRAPHIC STUDIES: Ct Chest W Contrast  12/26/2014   CLINICAL DATA:  Stage III non-small-cell lung cancer. Chemotherapy in progress. Chest pain and shortness of breath.  EXAM: CT CHEST WITH CONTRAST  TECHNIQUE: Multidetector CT imaging of the chest was performed during intravenous contrast administration.  CONTRAST:  6mL OMNIPAQUE IOHEXOL 300 MG/ML  SOLN  COMPARISON:  CTA chest 10/19/14.  FINDINGS: No appreciable change in the spiculated LEFT lower lobe mass as measured on comparable image 29 series 3 to the previous image  49 series 7 radiology different. Moderate LEFT pleural effusion, not significantly increased. Moderate fibrotic change along the LEFT hemithorax, also stable. Mild peribronchial thickening LEFT lower lobe not significantly worse.  No significant enlargement of the previously identified hilar and mediastinal nodes, up to 10 mm short axis LEFT paratracheal AP window.  Normal heart size. No pericardial effusion. Coronary artery calcification is present. Moderately large hiatal hernia projects into the retrocardiac region, stable. Moderate atheromatous change of the transverse descending aorta, without progression.  Unremarkable appearing upper abdominal structures.  Mild emphysematous change in the lung bases particularly is stable. No pulmonary nodules.  IMPRESSION: Stable LEFT lower lobe mass corresponding to the known lung cancer. No progression of disease.   Electronically Signed   By: Rolla Flatten M.D.   On: 12/26/2014 09:25   ASSESSMENT AND PLAN: This is a very pleasant 74 years old white male recently diagnosed with a stage IIIB non-small cell lung cancer, adenocarcinoma with large left lower lobe lung mass in addition to ipsilateral and contralateral mediastinal lymphadenopathy. He was not a candidate for concurrent radiotherapy because of the large volume of treatment area.  The patient underwent a course of systemic chemotherapy with carboplatin and Alimta status post 6 cycles. He tolerated his treatment fairly well and the recent CT scan of the chest showed further improvement in his disease. He is currently undergoing maintenance chemotherapy with single agent Alimta status post 3 cycles and tolerating his treatment well. The recent CT scan of the chest showed no evidence for disease progression. I discussed the scan results with the patient and his wife. I recommended for him to continue his current treatment with Alimta. For pain management, he will continue on Percocet on as-needed basis. I will  give him a dose of Percocet in the clinic today during his treatment. The patient would come back for followup visit in 3 weeks for evaluation with the start of cycle #5. He was advised to call immediately if he has any concerning symptoms in the interval.  The patient voices understanding of current disease status and treatment options and is in agreement with the current care plan.  All questions were answered. The patient knows to call the clinic with any problems, questions or concerns. We can certainly see the patient much sooner if necessary.  Disclaimer: This note was dictated with voice recognition software. Similar sounding words can inadvertently be transcribed and may be missed upon review.

## 2014-12-29 NOTE — Telephone Encounter (Signed)
appts made and avs printed for pt

## 2015-01-02 ENCOUNTER — Other Ambulatory Visit: Payer: Self-pay | Admitting: Nurse Practitioner

## 2015-01-02 ENCOUNTER — Encounter: Payer: Medicare Other | Admitting: Nurse Practitioner

## 2015-01-02 ENCOUNTER — Encounter (HOSPITAL_COMMUNITY): Payer: Self-pay | Admitting: *Deleted

## 2015-01-02 ENCOUNTER — Telehealth: Payer: Self-pay | Admitting: Nurse Practitioner

## 2015-01-02 ENCOUNTER — Emergency Department (HOSPITAL_COMMUNITY): Payer: Medicare Other

## 2015-01-02 ENCOUNTER — Inpatient Hospital Stay (HOSPITAL_COMMUNITY)
Admission: EM | Admit: 2015-01-02 | Discharge: 2015-01-08 | DRG: 982 | Disposition: A | Discharge status: Skilled Nursing Facility | Payer: Medicare Other | Attending: Internal Medicine | Admitting: Internal Medicine

## 2015-01-02 ENCOUNTER — Other Ambulatory Visit: Payer: Self-pay

## 2015-01-02 ENCOUNTER — Ambulatory Visit: Payer: Medicare Other

## 2015-01-02 ENCOUNTER — Telehealth: Payer: Self-pay

## 2015-01-02 DIAGNOSIS — Y95 Nosocomial condition: Secondary | ICD-10-CM | POA: Diagnosis present

## 2015-01-02 DIAGNOSIS — M549 Dorsalgia, unspecified: Secondary | ICD-10-CM | POA: Diagnosis present

## 2015-01-02 DIAGNOSIS — C3432 Malignant neoplasm of lower lobe, left bronchus or lung: Secondary | ICD-10-CM | POA: Diagnosis present

## 2015-01-02 DIAGNOSIS — J438 Other emphysema: Secondary | ICD-10-CM

## 2015-01-02 DIAGNOSIS — I10 Essential (primary) hypertension: Secondary | ICD-10-CM

## 2015-01-02 DIAGNOSIS — J189 Pneumonia, unspecified organism: Secondary | ICD-10-CM | POA: Diagnosis not present

## 2015-01-02 DIAGNOSIS — J181 Lobar pneumonia, unspecified organism: Secondary | ICD-10-CM | POA: Diagnosis present

## 2015-01-02 DIAGNOSIS — N390 Urinary tract infection, site not specified: Secondary | ICD-10-CM | POA: Diagnosis present

## 2015-01-02 DIAGNOSIS — Z8249 Family history of ischemic heart disease and other diseases of the circulatory system: Secondary | ICD-10-CM

## 2015-01-02 DIAGNOSIS — C3412 Malignant neoplasm of upper lobe, left bronchus or lung: Secondary | ICD-10-CM | POA: Diagnosis present

## 2015-01-02 DIAGNOSIS — K8000 Calculus of gallbladder with acute cholecystitis without obstruction: Secondary | ICD-10-CM | POA: Diagnosis present

## 2015-01-02 DIAGNOSIS — Z9079 Acquired absence of other genital organ(s): Secondary | ICD-10-CM | POA: Diagnosis present

## 2015-01-02 DIAGNOSIS — Z9981 Dependence on supplemental oxygen: Secondary | ICD-10-CM

## 2015-01-02 DIAGNOSIS — Z79899 Other long term (current) drug therapy: Secondary | ICD-10-CM

## 2015-01-02 DIAGNOSIS — G8929 Other chronic pain: Secondary | ICD-10-CM | POA: Diagnosis present

## 2015-01-02 DIAGNOSIS — J449 Chronic obstructive pulmonary disease, unspecified: Secondary | ICD-10-CM | POA: Diagnosis present

## 2015-01-02 DIAGNOSIS — T451X5A Adverse effect of antineoplastic and immunosuppressive drugs, initial encounter: Secondary | ICD-10-CM | POA: Diagnosis present

## 2015-01-02 DIAGNOSIS — N400 Enlarged prostate without lower urinary tract symptoms: Secondary | ICD-10-CM | POA: Diagnosis present

## 2015-01-02 DIAGNOSIS — K819 Cholecystitis, unspecified: Secondary | ICD-10-CM

## 2015-01-02 DIAGNOSIS — R102 Pelvic and perineal pain: Secondary | ICD-10-CM

## 2015-01-02 DIAGNOSIS — C3400 Malignant neoplasm of unspecified main bronchus: Secondary | ICD-10-CM

## 2015-01-02 DIAGNOSIS — F329 Major depressive disorder, single episode, unspecified: Secondary | ICD-10-CM | POA: Diagnosis present

## 2015-01-02 DIAGNOSIS — D701 Agranulocytosis secondary to cancer chemotherapy: Secondary | ICD-10-CM | POA: Diagnosis not present

## 2015-01-02 DIAGNOSIS — Z800 Family history of malignant neoplasm of digestive organs: Secondary | ICD-10-CM | POA: Diagnosis not present

## 2015-01-02 DIAGNOSIS — C3490 Malignant neoplasm of unspecified part of unspecified bronchus or lung: Secondary | ICD-10-CM

## 2015-01-02 DIAGNOSIS — K219 Gastro-esophageal reflux disease without esophagitis: Secondary | ICD-10-CM | POA: Diagnosis present

## 2015-01-02 DIAGNOSIS — Z87891 Personal history of nicotine dependence: Secondary | ICD-10-CM

## 2015-01-02 DIAGNOSIS — K810 Acute cholecystitis: Secondary | ICD-10-CM

## 2015-01-02 DIAGNOSIS — R0602 Shortness of breath: Secondary | ICD-10-CM | POA: Diagnosis present

## 2015-01-02 LAB — CBC
HEMATOCRIT: 31.2 % — AB (ref 39.0–52.0)
Hemoglobin: 10.5 g/dL — ABNORMAL LOW (ref 13.0–17.0)
MCH: 34 pg (ref 26.0–34.0)
MCHC: 33.7 g/dL (ref 30.0–36.0)
MCV: 101 fL — AB (ref 78.0–100.0)
PLATELETS: 174 10*3/uL (ref 150–400)
RBC: 3.09 MIL/uL — ABNORMAL LOW (ref 4.22–5.81)
RDW: 14.1 % (ref 11.5–15.5)
WBC: 5.1 10*3/uL (ref 4.0–10.5)

## 2015-01-02 LAB — BASIC METABOLIC PANEL
ANION GAP: 7 (ref 5–15)
BUN: 18 mg/dL (ref 6–23)
CALCIUM: 8.7 mg/dL (ref 8.4–10.5)
CHLORIDE: 96 mmol/L (ref 96–112)
CO2: 29 mmol/L (ref 19–32)
CREATININE: 0.91 mg/dL (ref 0.50–1.35)
GFR calc non Af Amer: 83 mL/min — ABNORMAL LOW (ref >90)
Glucose, Bld: 113 mg/dL — ABNORMAL HIGH (ref 70–99)
Potassium: 3.5 mmol/L (ref 3.5–5.1)
SODIUM: 132 mmol/L — AB (ref 135–145)

## 2015-01-02 LAB — I-STAT TROPONIN, ED: Troponin i, poc: 0.01 ng/mL (ref 0.00–0.08)

## 2015-01-02 LAB — BRAIN NATRIURETIC PEPTIDE: B NATRIURETIC PEPTIDE 5: 151.1 pg/mL — AB (ref 0.0–100.0)

## 2015-01-02 LAB — STREP PNEUMONIAE URINARY ANTIGEN: Strep Pneumo Urinary Antigen: NEGATIVE

## 2015-01-02 MED ORDER — SODIUM CHLORIDE 0.9 % IV SOLN
2000.0000 mg | Freq: Once | INTRAVENOUS | Status: AC
  Administered 2015-01-02: 2000 mg via INTRAVENOUS
  Filled 2015-01-02: qty 2000

## 2015-01-02 MED ORDER — ACETAMINOPHEN 325 MG PO TABS: 650.0000 mg | ORAL_TABLET | Freq: Four times a day (QID) | ORAL | Status: DC | PRN

## 2015-01-02 MED ORDER — ONDANSETRON HCL 4 MG/2ML IJ SOLN
4.0000 mg | Freq: Four times a day (QID) | INTRAMUSCULAR | Status: DC | PRN
  Administered 2015-01-06 (×2): 4 mg via INTRAVENOUS
  Filled 2015-01-02 (×3): qty 2

## 2015-01-02 MED ORDER — ALBUTEROL SULFATE (2.5 MG/3ML) 0.083% IN NEBU: 2.5000 mg | INHALATION_SOLUTION | Freq: Four times a day (QID) | RESPIRATORY_TRACT | Status: DC | PRN

## 2015-01-02 MED ORDER — OXYCODONE HCL 5 MG PO TABS
15.0000 mg | ORAL_TABLET | ORAL | Status: DC | PRN
  Administered 2015-01-02 – 2015-01-06 (×16): 15 mg via ORAL
  Filled 2015-01-02 (×16): qty 3

## 2015-01-02 MED ORDER — LATANOPROST 0.005 % OP SOLN
1.0000 [drp] | Freq: Every day | OPHTHALMIC | Status: DC
  Administered 2015-01-02 – 2015-01-07 (×6): 1 [drp] via OPHTHALMIC
  Filled 2015-01-02 (×2): qty 2.5

## 2015-01-02 MED ORDER — ACETAMINOPHEN 650 MG RE SUPP: 650.0000 mg | Freq: Four times a day (QID) | RECTAL | Status: DC | PRN

## 2015-01-02 MED ORDER — IOHEXOL 300 MG/ML  SOLN: 100.0000 mL | Freq: Once | INTRAMUSCULAR | Status: DC | PRN

## 2015-01-02 MED ORDER — NEBIVOLOL HCL 10 MG PO TABS
10.0000 mg | ORAL_TABLET | Freq: Two times a day (BID) | ORAL | Status: DC
  Administered 2015-01-02 – 2015-01-06 (×8): 10 mg via ORAL
  Filled 2015-01-02 (×10): qty 1

## 2015-01-02 MED ORDER — ENOXAPARIN SODIUM 60 MG/0.6ML ~~LOC~~ SOLN
50.0000 mg | SUBCUTANEOUS | Status: DC
  Administered 2015-01-02 – 2015-01-05 (×4): 50 mg via SUBCUTANEOUS
  Filled 2015-01-02 (×5): qty 0.6

## 2015-01-02 MED ORDER — TAMSULOSIN HCL 0.4 MG PO CAPS
0.4000 mg | ORAL_CAPSULE | Freq: Every morning | ORAL | Status: DC
  Administered 2015-01-03 – 2015-01-08 (×6): 0.4 mg via ORAL
  Filled 2015-01-02 (×6): qty 1

## 2015-01-02 MED ORDER — PANTOPRAZOLE SODIUM 40 MG PO TBEC
40.0000 mg | DELAYED_RELEASE_TABLET | Freq: Every day | ORAL | Status: DC
  Administered 2015-01-03 – 2015-01-06 (×4): 40 mg via ORAL
  Filled 2015-01-02 (×4): qty 1

## 2015-01-02 MED ORDER — VANCOMYCIN HCL IN DEXTROSE 750-5 MG/150ML-% IV SOLN
750.0000 mg | Freq: Two times a day (BID) | INTRAVENOUS | Status: DC
  Administered 2015-01-03 – 2015-01-04 (×4): 750 mg via INTRAVENOUS
  Filled 2015-01-02 (×4): qty 150

## 2015-01-02 MED ORDER — IOHEXOL 350 MG/ML SOLN
100.0000 mL | Freq: Once | INTRAVENOUS | Status: AC | PRN
  Administered 2015-01-02: 100 mL via INTRAVENOUS

## 2015-01-02 MED ORDER — DEXTROSE 5 % IV SOLN
1.0000 g | Freq: Three times a day (TID) | INTRAVENOUS | Status: DC
  Administered 2015-01-02 – 2015-01-06 (×11): 1 g via INTRAVENOUS
  Filled 2015-01-02 (×12): qty 1

## 2015-01-02 MED ORDER — HYDROMORPHONE HCL 1 MG/ML IJ SOLN
1.0000 mg | Freq: Once | INTRAMUSCULAR | Status: AC
  Administered 2015-01-02: 1 mg via INTRAVENOUS
  Filled 2015-01-02: qty 1

## 2015-01-02 MED ORDER — DEXTROSE 5 % IV SOLN
2.0000 g | Freq: Once | INTRAVENOUS | Status: AC
  Administered 2015-01-02: 2 g via INTRAVENOUS
  Filled 2015-01-02: qty 2

## 2015-01-02 MED ORDER — TEMAZEPAM 15 MG PO CAPS
15.0000 mg | ORAL_CAPSULE | Freq: Every evening | ORAL | Status: DC | PRN
  Administered 2015-01-04: 15 mg via ORAL
  Filled 2015-01-02: qty 1

## 2015-01-02 MED ORDER — ONDANSETRON HCL 4 MG/2ML IJ SOLN
4.0000 mg | Freq: Once | INTRAMUSCULAR | Status: AC
  Administered 2015-01-02: 4 mg via INTRAVENOUS
  Filled 2015-01-02: qty 2

## 2015-01-02 MED ORDER — ONDANSETRON HCL 4 MG PO TABS: 4.0000 mg | ORAL_TABLET | Freq: Four times a day (QID) | ORAL | Status: DC | PRN

## 2015-01-02 MED ORDER — ESCITALOPRAM OXALATE 20 MG PO TABS
20.0000 mg | ORAL_TABLET | Freq: Every day | ORAL | Status: DC
  Administered 2015-01-02 – 2015-01-06 (×5): 20 mg via ORAL
  Filled 2015-01-02 (×6): qty 1

## 2015-01-02 NOTE — Telephone Encounter (Signed)
Called for pt in lobby, pt unavailable. Looked in scheduling, looked in bathrooms.

## 2015-01-02 NOTE — H&P (Signed)
History and Physical  Tyler [name] LKG:[id number] DOB: 1941/07/31 DOA: 01/02/2015   PCP: Tamsen Roers, MD   Chief Complaint: Shortness of breath  HPI:  74 year old male with a history of COPD, hypertension, non-small cell lung carcinoma diagnosed in June 2015 (Dr. Julien Nordmann), depression presents with 3 day history of worsening shortness of breath. At baseline, the patient uses 2 L nasal cannula when necessary activity and shortness of breath at home. The patient denies any hemoptysis but complains of a cough with white sputum. He denies any fevers, chills, hemoptysis, nausea, vomiting, diarrhea. There's been no orthopnea or PND. The patient last received chemotherapy proximally 4 days prior to this admission. He denies any abdominal pain, dysuria, hematochezia, melena.  In the emergency department, hemoglobin was 10.5, WBC 5.1, sodium 132. Otherwise BMP was unremarkable. EKG shows sinus rhythm without any ST-T wave changes. CT abdomen and chest was negative for pulmonary embolus but showed his usual left lower lobe lung mass. There was increased consolidation and collapse in the left lower lobe. There was also multifocal patchy opacities. Assessment/Plan: HCAP/Postobstructive pneumonia -Although the patient's CT and her grandmother chest suggested maybe interstitial edema/pulmonary edema, the patient's clinical exam is not suggestive of fluid overload. -CT angiogram of chest suggested extrinsic compression on the left lower lobe airways by his lung mass  -blood cultures have been obtained -Continue vancomycin and cefepime - check BNP and echocardiogram  -Although the patient does not have leukocytosis his recent chemotherapy is certainly be confounding the picture  -check procalcitonin COPD -no wheeze -albuterol neb prn sob, wheeze Stage IIIB (T2b, N3, M0) non-small cell lung cancer -last chemo on 12/29/14 -Placed on Dr. Worthy Flank HTN -continue Bystolic Chronic back pain -continue  home oxycodone       Past Medical History  Diagnosis Date  . Allergic rhinitis   . BPH (benign prostatic hyperplasia)   . GERD (gastroesophageal reflux disease)   . COPD (chronic obstructive pulmonary disease)   . Shortness of breath   . Arthritis   . Enlarged prostate   . Hypertension     dr Margreta Journey little in [place]   pcp      dr Stanford Breed  cardiac md  . Mental disorder   . Constipation   . Chronic chest pain   . Pneumonia     hx  . Cancer Lung  . 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 Ankle surgery  2005  . Prostate surgery  11/25/2010  . Hernia repair    . Mediastinoscopy  06/11/2012    Procedure: MEDIASTINOSCOPY;  Surgeon: Grace Isaac, MD;  Location: [facility];  Service: Thoracic;  Laterality: N/A;  . Inguinal hernia repair      left  . Eye surgery      cataract right  . Video bronchoscopy with endobronchial ultrasound  10/08/2012    Procedure: VIDEO BRONCHOSCOPY WITH ENDOBRONCHIAL ULTRASOUND;  Surgeon: Collene Gobble, MD;  Location: [facility];  Service: Pulmonary;  Laterality: N/A;  . Video bronchoscopy Bilateral 04/11/2014    Procedure: VIDEO BRONCHOSCOPY WITHOUT FLUORO;  Surgeon: Collene Gobble, MD;  Location: [facility];  Service: Cardiopulmonary;  Laterality: Bilateral;   Social History:  reports that he quit smoking about 5 years ago. His smoking use included Cigarettes. He has a 100 pack-year smoking history. He has never used smokeless tobacco. He reports that he drinks about 14.4 oz of alcohol per week. He  reports that he does not use illicit drugs.   Family History  Problem Relation Age of Onset  . Heart disease Mother   . Arthritis Sister   . Arthritis Sister   . Liver cancer Father      Allergies  Allergen Reactions  . Ketorolac Tromethamine Anaphylaxis    REACTION TO Tordal  . Codeine Itching  . Morphine And Related     Broke him out in a rash on abdomen  . Tramadol Rash  .  Tussionex Pennkinetic Er [Hydrocod Polst-Cpm Polst Er] Rash      Prior to Admission medications   Medication Sig Start Date End Date Taking? Authorizing Provider  albuterol (PROVENTIL HFA;VENTOLIN HFA) 108 (90 BASE) MCG/ACT inhaler Inhale 2 puffs into the lungs every 6 (six) hours as needed for wheezing or shortness of breath.   Yes Historical Provider, MD  albuterol (PROVENTIL) (2.5 MG/3ML) 0.083% nebulizer solution Take 3 mLs (2.5 mg total) by nebulization every 6 (six) hours as needed for wheezing. 04/14/14  Yes Collene Gobble, MD  amitriptyline (ELAVIL) 10 MG tablet Take 10 mg by mouth at bedtime as needed. 12/09/14  Yes Historical Provider, MD  dexamethasone (DECADRON) 4 MG tablet Take 1 tablet by mouth twice a day, the day before, the day of and the day after chemotherapy. Take with food 05/19/14  Yes Adrena E Johnson, PA-C  escitalopram (LEXAPRO) 20 MG tablet Take 20 mg by mouth daily.  08/16/14  Yes Historical Provider, MD  flurazepam (DALMANE) 30 MG capsule Take 30 mg by mouth at bedtime as needed for sleep.   Yes Historical Provider, MD  folic acid (FOLVITE) 1 MG tablet Take 1 tablet (1 mg total) by mouth daily. 11/19/14  Yes Adrena E Johnson, PA-C  LUMIGAN 0.01 % SOLN Place 1 drop into both eyes at bedtime.  12/10/14  Yes Historical Provider, MD  naproxen (NAPROSYN) 250 MG tablet Take 1 tablet by mouth 3 (three) times daily as needed (arthritis).  11/30/14  Yes Historical Provider, MD  nebivolol (BYSTOLIC) 10 MG tablet Take 10 mg by mouth 2 (two) times daily.    Yes Historical Provider, MD  omeprazole (PRILOSEC) 20 MG capsule Take 20 mg by mouth 2 (two) times daily before a meal.    Yes Historical Provider, MD  oxyCODONE (ROXICODONE) 15 MG immediate release tablet Take 1 tablet (15 mg total) by mouth every 4 (four) hours as needed for pain. 12/08/14  Yes Carlton Adam, PA-C  PRESCRIPTION MEDICATION Chemotherapy at [facility] (Dr. Julien Nordmann).  Last dose of  Alimta 12/29/2014   Yes Historical Provider,  MD  Tamsulosin HCl (FLOMAX) 0.4 MG CAPS Take 0.4 mg by mouth every morning.    Yes Historical Provider, MD  ondansetron (ZOFRAN) 8 MG tablet Take 1 tablet (8 mg total) by mouth every 8 (eight) hours as needed for nausea or vomiting. Patient not taking: Reported on 12/29/2014 12/08/14   Carlton Adam, PA-C  prochlorperazine (COMPAZINE) 10 MG tablet Take 1 tablet (10 mg total) by mouth every 6 (six) hours as needed for nausea or vomiting. Patient not taking: Reported on 12/29/2014 12/08/14   Carlton Adam, PA-C    Review of Systems:  Constitutional:  No weight loss, night sweats, Fevers, chills,  Head&Eyes:  No headache.  No vision loss.  No eye pain or scotoma ENT:  No Difficulty swallowing,Tooth/dental problems,Sore throat,   Cardio-vascular:  No chest pain, Orthopnea, PND, swelling in lower extremities,  dizziness, palpitations  GI:  No  abdominal pain, nausea, vomiting, diarrhea, loss of appetite, hematochezia, melena, heartburn, indigestion, Resp:   No coughing up of blood .No wheezing.No chest wall deformity  Skin:  no rash or lesions.  GU:  no dysuria, change in color of urine, no urgency or frequency. No flank pain.  Musculoskeletal:  No joint pain or swelling. No decreased range of motion.  Psych:  No change in mood or affect.  Neurologic: No headache, no dysesthesia, no focal weakness, no vision loss. No syncope  Physical Exam: Filed Vitals:   01/02/15 1230 01/02/15 1452 01/02/15 1631 01/02/15 1648  BP: 145/67 138/68  156/70  Pulse: 74 72 67 71  Temp:    98.2 F (36.8 C)  TempSrc:    Oral  Resp: 18 18  18   Height:      Weight:      SpO2: 97% 100% 96% 99%   General:  A&O x 3, NAD, nontoxic, pleasant/cooperative Head/Eye: No conjunctival hemorrhage, no icterus, Bailey/AT, No nystagmus ENT:  No icterus,  No thrush, good dentition, no pharyngeal exudate Neck:  No masses, no lymphadenpathy, no bruits CV:  RRR, no rub, no gallop, no S3 Lung: Bilateral rales. Left  greater than right. No wheezing. Abdomen: soft/NT, +BS, nondistended, no peritoneal signs Ext: No cyanosis, No rashes, No petechiae, No lymphangitis, No edema   Labs on Admission:  Basic Metabolic Panel:  Recent Labs Lab 12/29/14 0812 01/02/15 1104  NA 142 132*  K 3.9 3.5  CL  --  96  CO2 27 29  GLUCOSE 100 113*  BUN 16.3 18  CREATININE 0.9 0.91  CALCIUM 9.3 8.7   Liver Function Tests:  Recent Labs Lab 12/29/14 0812  AST 19  ALT 18  ALKPHOS 94  BILITOT 0.32  PROT 6.3*  ALBUMIN 2.9*   No results for input(s): LIPASE, AMYLASE in the last 168 hours. No results for input(s): AMMONIA in the last 168 hours. CBC:  Recent Labs Lab 12/29/14 0813 01/02/15 1104  WBC 5.8 5.1  NEUTROABS 3.4  --   HGB 11.3* 10.5*  HCT 34.0* 31.2*  MCV 101.8* 101.0*  PLT 165 174   Cardiac Enzymes: No results for input(s): CKTOTAL, CKMB, CKMBINDEX, TROPONINI in the last 168 hours. BNP: Invalid input(s): POCBNP CBG: No results for input(s): GLUCAP in the last 168 hours.  Radiological Exams on Admission: Dg Chest 2 View  01/02/2015   CLINICAL DATA:  Shortness of breath.  EXAM: CHEST  2 VIEW  COMPARISON:  October 19, 2014.  FINDINGS: Stable cardiomediastinal silhouette. No pneumothorax or significant pleural effusion is noted. Right lung is clear. Mild left basilar opacity is noted most consistent with subsegmental atelectasis. Bony thorax is intact.  IMPRESSION: Mild left basilar subsegmental atelectasis.   Electronically Signed   By: Marijo Conception, M.D.   On: 01/02/2015 12:51   Ct Angio Chest Pe W/cm &/or Wo Cm  01/02/2015   CLINICAL DATA:  Short of breath. Onset of symptoms 3-4 days after last chemotherapy. LEFT lower lobe cancer. Chronic back pain.  EXAM: CT ANGIOGRAPHY CHEST WITH CONTRAST  TECHNIQUE: Multidetector CT imaging of the chest was performed using the standard protocol during bolus administration of intravenous contrast. Multiplanar CT image reconstructions and MIPs were  obtained to evaluate the vascular anatomy.  CONTRAST:  184mL OMNIPAQUE IOHEXOL 350 MG/ML SOLN  COMPARISON:  10/19/2014.  12/26/2014.  FINDINGS: Bones: No aggressive osseous lesions. Lower cervical spondylosis. No thoracic spine compression fractures.  Cardiovascular: No acute aortic abnormality. Aortic arch atherosclerosis. Coronary  artery atherosclerosis is present. If office based assessment of coronary risk factors has not been performed, it is now recommended.  No pulmonary embolus is present. There is extrinsic mass effect on the LEFT lower lobe pulmonary arteries associated with LEFT hilar and LEFT lower lobe mass. This nearly occludes the LEFT lower lobe pulmonary artery. No convincing evidence of acute pulmonary embolus or bland thrombus. The narrowing of the vessel appears extrinsic rather than from filling defects.  Comparing the heart size to the recent prior exam, the heart appears more dilated on today's study with LEFT atrial enlargement.  Lungs:  LEFT lower lobe mass is present and has enlarged slightly compared to the prior exam. When measured on soft tissue windows, this measures 36 mm x 26 mm at a similar location to the prior exam. Soft tissue density extends into the LEFT hilum.  Distal to the mass, there is increasing collapse/consolidation of the superior segment of the LEFT lower lobe that is most compatible with postobstructive pneumonia.  Severe emphysema. There is multifocal multilobar patchy airspace opacity. Notably, this is new compared to the recent chest CT. Interlobular septal thickening is present at the apices. The differential considerations are pulmonary edema versus infection. Aspiration is considered less likely. This is more prominent in the lingula and LEFT lower lobe.  Increasing collapse/consolidation in the basal LEFT lower lobe, some of which is probably due to lower volumes and effusion.  Central airways: Trachea patent. Extrinsic mass effect on the LEFT lung bronchi.   Effusions: Small LEFT-greater-than-RIGHT bilateral dependently layering pleural effusions. Small amount of posterior pericardial fluid or thickening is increased compared to prior.  Lymphadenopathy: No axillary adenopathy. Small prevascular nodes. Low RIGHT peritracheal old borderline adenopathy. This appears larger than prior. LEFT hilar mass and adenopathy associated with LEFT lower lobe tumor again noted.  Esophagus: Moderate hiatal hernia.  Esophagus appears normal.  Upper abdomen: Adrenal glands within normal limits. Old granulomatous disease in the liver.  Other: None.  Review of the MIP images confirms the above findings.  IMPRESSION: 1. Negative for pulmonary embolus. LEFT lower lobe superior segment pulmonary mass extending to the LEFT hilum today measuring 36 mm x 26 mm, larger than on the most recent comparison. Extrinsic compression of the LEFT lower lobe airways and pulmonary vessels. 2. Constellation of findings compatible with mild CHF. Interstitial and mild alveolar or pulmonary edema is favored over multifocal bronchopneumonia. 3. Underlying emphysema and coronary artery disease. 4. Increasing consolidation of the LEFT lower lobe distal to the mass, suggesting postobstructive pneumonia.   Electronically Signed   By: Dereck Ligas M.D.   On: 01/02/2015 14:38    EKG: Independently reviewed. NSR, no ST-T changes    Time spent:60 minutes Code Status:   FULL Family Communication:   Sister updated at bedside   Bashir Marchetti, DO  [facility] Pager [phone]  If 7PM-7AM, please contact night-coverage [email or web address] Password [email or web address] 01/02/2015, 4:53 PM

## 2015-01-02 NOTE — Telephone Encounter (Signed)
S/w pt's sister to advise we have scheduled pt for labs/ov per 03/04 POF, pt's sister states he was hurting so bad that he had to leave and go to ER and he was just hooked up to IV's when I called her. Status is put pt left without being seen.... KJ

## 2015-01-02 NOTE — Progress Notes (Signed)
ANTIBIOTIC CONSULT NOTE - INITIAL  Pharmacy Consult for Cefepime & Cefepime Indication: HCAP  Allergies  Allergen Reactions  . Ketorolac Tromethamine Anaphylaxis    REACTION TO Tordal  . Codeine Itching  . Morphine And Related     Broke him out in a rash on abdomen  . Tramadol Rash  . Tussionex Pennkinetic Er [Hydrocod Polst-Cpm Polst Er] Rash   Patient Measurements: Height: 6\' 2"  (188 cm) Weight: 228 lb (103.42 kg) IBW/kg (Calculated) : 82.2  Vital Signs: Temp: 98.2 F (36.8 C) (03/04 1648) Temp Source: Oral (03/04 1648) BP: 156/70 mmHg (03/04 1648) Pulse Rate: 71 (03/04 1648) Intake/Output from previous day:   Intake/Output from this shift:    Labs:  Recent Labs  01/02/15 1104  WBC 5.1  HGB 10.5*  PLT 174  CREATININE 0.91   Estimated Creatinine Clearance: 94.1 mL/min (by C-G formula based on Cr of 0.91). No results for input(s): VANCOTROUGH, VANCOPEAK, VANCORANDOM, GENTTROUGH, GENTPEAK, GENTRANDOM, TOBRATROUGH, TOBRAPEAK, TOBRARND, AMIKACINPEAK, AMIKACINTROU, AMIKACIN in the last 72 hours.   Microbiology: No results found for this or any previous visit (from the past 720 hour(s)).  Medical History: Past Medical History  Diagnosis Date  . Allergic rhinitis   . BPH (benign prostatic hyperplasia)   . GERD (gastroesophageal reflux disease)   . COPD (chronic obstructive pulmonary disease)   . Shortness of breath   . Arthritis   . Enlarged prostate   . Hypertension     dr Margreta Journey little in [place]   pcp      dr Stanford Breed  cardiac md  . Mental disorder   . Constipation   . Chronic chest pain   . Pneumonia     hx  . Cancer Lung  . Lung cancer 04/11/14    lul carina lesion=Adenocarcinoma   Assessment: 35 yoM with PMH of NSCLC-last chemo 2/29, presents with acute on chronic dyspnea, chills, and cough. Chest CT: likely postobstructive PNA.  Pharmacy consulted to dose Cefepime & Vancomycin for HCAP.  3/4 >> Vancomycin >> 3/4 >> Cefepime >>    Tmax:  98.19F WBCs: WNL, 5.1 Renal: SCr 0.91, CrCl ~ 68 ml/min (normalized)  3/4 blood x 2: sent  Goal of Therapy:  Appropriate antibiotic dosing for renal function and indication Eradication of infection Vancomycin trough 15-20 mcg/ml  Plan:   Cefepime 2g IV x 1, then 1g IV q8h  Vancomycin 2gm x1, then 750mg  q12  Monitor renal function, cultures, clinical course  Minda Ditto PharmD Pager [phone] 01/02/2015, 5:38 PM

## 2015-01-02 NOTE — ED Notes (Signed)
Pt reports SOB x3-4 days that started after last chemo treatment on Monday. Hx lung Ca. C/o chronic back pain, no change. Denies chest pain. Cough x3-4 days. Productive, white mucus. SOB worse with exertion.

## 2015-01-02 NOTE — Progress Notes (Signed)
ANTIBIOTIC CONSULT NOTE - INITIAL  Pharmacy Consult for Cefepime Indication: HCAP  Allergies  Allergen Reactions  . Ketorolac Tromethamine Anaphylaxis    REACTION TO Tordal  . Codeine Itching  . Morphine And Related     Broke him out in a rash on abdomen  . Tramadol Rash  . Tussionex Pennkinetic Er [Hydrocod Polst-Cpm Polst Er] Rash    Patient Measurements: Height: 6\' 2"  (188 cm) Weight: 228 lb (103.42 kg) IBW/kg (Calculated) : 82.2   Vital Signs: Temp: 98.6 F (37 C) (03/04 1048) Temp Source: Oral (03/04 1048) BP: 138/68 mmHg (03/04 1452) Pulse Rate: 72 (03/04 1452) Intake/Output from previous day:   Intake/Output from this shift:    Labs:  Recent Labs  01/02/15 1104  WBC 5.1  HGB 10.5*  PLT 174  CREATININE 0.91   Estimated Creatinine Clearance: 94.1 mL/min (by C-G formula based on Cr of 0.91). No results for input(s): VANCOTROUGH, VANCOPEAK, VANCORANDOM, GENTTROUGH, GENTPEAK, GENTRANDOM, TOBRATROUGH, TOBRAPEAK, TOBRARND, AMIKACINPEAK, AMIKACINTROU, AMIKACIN in the last 72 hours.   Microbiology: No results found for this or any previous visit (from the past 720 hour(s)).  Medical History: Past Medical History  Diagnosis Date  . Allergic rhinitis   . BPH (benign prostatic hyperplasia)   . GERD (gastroesophageal reflux disease)   . COPD (chronic obstructive pulmonary disease)   . Shortness of breath   . Arthritis   . Enlarged prostate   . Hypertension     dr Margreta Journey little in [place]   pcp      dr Stanford Breed  cardiac md  . Mental disorder   . Constipation   . Chronic chest pain   . Pneumonia     hx  . Cancer Lung  . 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 Pharmacy consulted to dose Cefepime for HCAP.  3/4 >> Vancomycin x 1 3/4 >> Cefepime >>    Tmax: 98.52F WBCs: WNL, 5.1 Renal: SCr 0.91, CrCl ~ 94 mL/min  3/4 blood x 2:  sent  Goal of Therapy:  Appropriate antibiotic dosing for renal function and indication Eradication of infection  Plan:   Cefepime 2g IV x 1, then 1g IV q8h  Monitor renal function, cultures, clinical course.  F/u on ? continuation of Vancomycin therapy.   Lindell Spar, PharmD, BCPS Pager: [phone] 01/02/2015 3:02 PM

## 2015-01-02 NOTE — Telephone Encounter (Signed)
Called to let us know pt went to ED. His sister said he was hurting too bad and could not wait. Cyndee notified.

## 2015-01-02 NOTE — ED Provider Notes (Signed)
CSN: [id number]     Arrival date & time 01/02/15  1034 History   First MD Initiated Contact with Patient 01/02/15 1103     Chief Complaint  Patient presents with  . Shortness of Breath     (Consider location/radiation/quality/duration/timing/severity/associated sxs/prior Treatment) Patient is a 74 y.o. male presenting with cough.  Cough Cough characteristics:  Productive Sputum characteristics:  Clear Severity:  Moderate Onset quality:  Gradual Duration:  3 days Timing:  Constant Progression:  Worsening Chronicity:  Recurrent Smoker: no   Context comment:  Ho st 3 lung ca, currently on chemo Relieved by:  Nothing Worsened by:  Deep breathing Associated symptoms: chills and shortness of breath (chronically on home O2 intermittently 3L)   Associated symptoms: no chest pain, no fever and no rash     Past Medical History  Diagnosis Date  . Allergic rhinitis   . BPH (benign prostatic hyperplasia)   . GERD (gastroesophageal reflux disease)   . COPD (chronic obstructive pulmonary disease)   . Shortness of breath   . Arthritis   . Enlarged prostate   . Hypertension     dr Margreta Journey little in [place]   pcp      dr Stanford Breed  cardiac md  . Mental disorder   . Constipation   . Chronic chest pain   . Pneumonia     hx  . Cancer Lung  . 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 Ankle surgery  2005  . Prostate surgery  11/25/2010  . Hernia repair    . Mediastinoscopy  06/11/2012    Procedure: MEDIASTINOSCOPY;  Surgeon: Grace Isaac, MD;  Location: [facility];  Service: Thoracic;  Laterality: N/A;  . Inguinal hernia repair      left  . Eye surgery      cataract right  . Video bronchoscopy with endobronchial ultrasound  10/08/2012    Procedure: VIDEO BRONCHOSCOPY WITH ENDOBRONCHIAL ULTRASOUND;  Surgeon: Collene Gobble, MD;  Location: [facility];  Service: Pulmonary;  Laterality: N/A;  . Video bronchoscopy Bilateral  04/11/2014    Procedure: VIDEO BRONCHOSCOPY WITHOUT FLUORO;  Surgeon: Collene Gobble, MD;  Location: [facility];  Service: Cardiopulmonary;  Laterality: Bilateral;   Family History  Problem Relation Age of Onset  . Heart disease Mother   . Arthritis Sister   . Arthritis Sister   . Liver cancer Father    History  Substance Use Topics  . Smoking status: Former Smoker -- 2.00 packs/day for 50 years    Types: Cigarettes    Quit date: 12/26/2009  . Smokeless tobacco: Never Used  . Alcohol Use: 14.4 oz/week    24 Cans of beer per week     Comment: 4 beers every night    Review of Systems  Constitutional: Positive for chills. Negative for fever.  Respiratory: Positive for cough and shortness of breath (chronically on home O2 intermittently 3L).   Cardiovascular: Negative for chest pain.  Skin: Negative for rash.  All other systems reviewed and are negative.     Allergies  Ketorolac tromethamine; Codeine; Morphine and related; Tramadol; and Tussionex pennkinetic er  Home Medications   Prior to Admission medications   Medication Sig Start Date End Date Taking? Authorizing Provider  albuterol (PROVENTIL HFA;VENTOLIN HFA) 108 (90 BASE) MCG/ACT inhaler Inhale 2 puffs into the lungs every 6 (six) hours as needed for wheezing or shortness of breath.   Yes Historical  Provider, MD  albuterol (PROVENTIL) (2.5 MG/3ML) 0.083% nebulizer solution Take 3 mLs (2.5 mg total) by nebulization every 6 (six) hours as needed for wheezing. 04/14/14  Yes Collene Gobble, MD  amitriptyline (ELAVIL) 10 MG tablet Take 10 mg by mouth at bedtime as needed. 12/09/14  Yes Historical Provider, MD  dexamethasone (DECADRON) 4 MG tablet Take 1 tablet by mouth twice a day, the day before, the day of and the day after chemotherapy. Take with food 05/19/14  Yes Adrena E Johnson, PA-C  escitalopram (LEXAPRO) 20 MG tablet Take 20 mg by mouth daily.  08/16/14  Yes Historical Provider, MD  flurazepam (DALMANE) 30 MG capsule  Take 30 mg by mouth at bedtime as needed for sleep.   Yes Historical Provider, MD  folic acid (FOLVITE) 1 MG tablet Take 1 tablet (1 mg total) by mouth daily. 11/19/14  Yes Adrena E Johnson, PA-C  LUMIGAN 0.01 % SOLN Place 1 drop into both eyes at bedtime.  12/10/14  Yes Historical Provider, MD  naproxen (NAPROSYN) 250 MG tablet Take 1 tablet by mouth 3 (three) times daily as needed (arthritis).  11/30/14  Yes Historical Provider, MD  nebivolol (BYSTOLIC) 10 MG tablet Take 10 mg by mouth 2 (two) times daily.    Yes Historical Provider, MD  omeprazole (PRILOSEC) 20 MG capsule Take 20 mg by mouth 2 (two) times daily before a meal.    Yes Historical Provider, MD  oxyCODONE (ROXICODONE) 15 MG immediate release tablet Take 1 tablet (15 mg total) by mouth every 4 (four) hours as needed for pain. 12/08/14  Yes Carlton Adam, PA-C  PRESCRIPTION MEDICATION Chemotherapy at [facility] (Dr. Julien Nordmann).  Last dose of  Alimta 12/29/2014   Yes Historical Provider, MD  Tamsulosin HCl (FLOMAX) 0.4 MG CAPS Take 0.4 mg by mouth every morning.    Yes Historical Provider, MD  ondansetron (ZOFRAN) 8 MG tablet Take 1 tablet (8 mg total) by mouth every 8 (eight) hours as needed for nausea or vomiting. Patient not taking: Reported on 12/29/2014 12/08/14   Carlton Adam, PA-C  prochlorperazine (COMPAZINE) 10 MG tablet Take 1 tablet (10 mg total) by mouth every 6 (six) hours as needed for nausea or vomiting. Patient not taking: Reported on 12/29/2014 12/08/14   Burnetta Sabin E Johnson, PA-C   BP 151/69 mmHg  Pulse 65  Temp(Src) 98.2 F (36.8 C) (Oral)  Resp 18  Ht 6\' 2"  (1.88 m)  Wt 228 lb (103.42 kg)  BMI 29.26 kg/m2  SpO2 99% Physical Exam  Constitutional: He is oriented to person, place, and time. He appears well-developed and well-nourished.  HENT:  Head: Normocephalic and atraumatic.  Eyes: Conjunctivae and EOM are normal.  Neck: Normal range of motion. Neck supple.  Cardiovascular: Normal rate, regular rhythm and normal heart  sounds.   Pulmonary/Chest: Effort normal. No respiratory distress. He has rales in the right lower field and the left lower field.  Abdominal: He exhibits no distension. There is no tenderness. There is no rebound and no guarding.  Musculoskeletal: Normal range of motion.  Neurological: He is alert and oriented to person, place, and time.  Skin: Skin is warm and dry.  Vitals reviewed.   ED Course  Procedures (including critical care time) Labs Review Labs Reviewed  CBC - Abnormal; Notable for the following:    RBC 3.09 (*)    Hemoglobin 10.5 (*)    HCT 31.2 (*)    MCV 101.0 (*)    All other components within  normal limits  BASIC METABOLIC PANEL - Abnormal; Notable for the following:    Sodium 132 (*)    Glucose, Bld 113 (*)    GFR calc non Af Amer 83 (*)    All other components within normal limits  COMPREHENSIVE METABOLIC PANEL - Abnormal; Notable for the following:    Glucose, Bld 132 (*)    Albumin 2.9 (*)    GFR calc non Af Amer 86 (*)    All other components within normal limits  CBC - Abnormal; Notable for the following:    WBC 2.6 (*)    RBC 2.88 (*)    Hemoglobin 9.9 (*)    HCT 28.8 (*)    MCH 34.4 (*)    Platelets 139 (*)    All other components within normal limits  BRAIN NATRIURETIC PEPTIDE - Abnormal; Notable for the following:    B Natriuretic Peptide 151.1 (*)    All other components within normal limits  CULTURE, BLOOD (ROUTINE X 2)  CULTURE, BLOOD (ROUTINE X 2)  STREP PNEUMONIAE URINARY ANTIGEN  LEGIONELLA ANTIGEN, URINE  I-STAT TROPOININ, ED    Imaging Review Dg Chest 2 View  01/02/2015   CLINICAL DATA:  Shortness of breath.  EXAM: CHEST  2 VIEW  COMPARISON:  October 19, 2014.  FINDINGS: Stable cardiomediastinal silhouette. No pneumothorax or significant pleural effusion is noted. Right lung is clear. Mild left basilar opacity is noted most consistent with subsegmental atelectasis. Bony thorax is intact.  IMPRESSION: Mild left basilar subsegmental  atelectasis.   Electronically Signed   By: Marijo Conception, M.D.   On: 01/02/2015 12:51   Ct Angio Chest Pe W/cm &/or Wo Cm  01/02/2015   CLINICAL DATA:  Short of breath. Onset of symptoms 3-4 days after last chemotherapy. LEFT lower lobe cancer. Chronic back pain.  EXAM: CT ANGIOGRAPHY CHEST WITH CONTRAST  TECHNIQUE: Multidetector CT imaging of the chest was performed using the standard protocol during bolus administration of intravenous contrast. Multiplanar CT image reconstructions and MIPs were obtained to evaluate the vascular anatomy.  CONTRAST:  179mL OMNIPAQUE IOHEXOL 350 MG/ML SOLN  COMPARISON:  10/19/2014.  12/26/2014.  FINDINGS: Bones: No aggressive osseous lesions. Lower cervical spondylosis. No thoracic spine compression fractures.  Cardiovascular: No acute aortic abnormality. Aortic arch atherosclerosis. Coronary artery atherosclerosis is present. If office based assessment of coronary risk factors has not been performed, it is now recommended.  No pulmonary embolus is present. There is extrinsic mass effect on the LEFT lower lobe pulmonary arteries associated with LEFT hilar and LEFT lower lobe mass. This nearly occludes the LEFT lower lobe pulmonary artery. No convincing evidence of acute pulmonary embolus or bland thrombus. The narrowing of the vessel appears extrinsic rather than from filling defects.  Comparing the heart size to the recent prior exam, the heart appears more dilated on today's study with LEFT atrial enlargement.  Lungs:  LEFT lower lobe mass is present and has enlarged slightly compared to the prior exam. When measured on soft tissue windows, this measures 36 mm x 26 mm at a similar location to the prior exam. Soft tissue density extends into the LEFT hilum.  Distal to the mass, there is increasing collapse/consolidation of the superior segment of the LEFT lower lobe that is most compatible with postobstructive pneumonia.  Severe emphysema. There is multifocal multilobar patchy  airspace opacity. Notably, this is new compared to the recent chest CT. Interlobular septal thickening is present at the apices. The differential considerations are pulmonary  edema versus infection. Aspiration is considered less likely. This is more prominent in the lingula and LEFT lower lobe.  Increasing collapse/consolidation in the basal LEFT lower lobe, some of which is probably due to lower volumes and effusion.  Central airways: Trachea patent. Extrinsic mass effect on the LEFT lung bronchi.  Effusions: Small LEFT-greater-than-RIGHT bilateral dependently layering pleural effusions. Small amount of posterior pericardial fluid or thickening is increased compared to prior.  Lymphadenopathy: No axillary adenopathy. Small prevascular nodes. Low RIGHT peritracheal old borderline adenopathy. This appears larger than prior. LEFT hilar mass and adenopathy associated with LEFT lower lobe tumor again noted.  Esophagus: Moderate hiatal hernia.  Esophagus appears normal.  Upper abdomen: Adrenal glands within normal limits. Old granulomatous disease in the liver.  Other: None.  Review of the MIP images confirms the above findings.  IMPRESSION: 1. Negative for pulmonary embolus. LEFT lower lobe superior segment pulmonary mass extending to the LEFT hilum today measuring 36 mm x 26 mm, larger than on the most recent comparison. Extrinsic compression of the LEFT lower lobe airways and pulmonary vessels. 2. Constellation of findings compatible with mild CHF. Interstitial and mild alveolar or pulmonary edema is favored over multifocal bronchopneumonia. 3. Underlying emphysema and coronary artery disease. 4. Increasing consolidation of the LEFT lower lobe distal to the mass, suggesting postobstructive pneumonia.   Electronically Signed   By: Dereck Ligas M.D.   On: 01/02/2015 14:38     EKG Interpretation   Date/Time:  Friday January 02 2015 10:45:13 EST Ventricular Rate:  72 PR Interval:  198 QRS Duration: 83 QT  Interval:  392 QTC Calculation: 429 R Axis:   44 Text Interpretation:  Sinus rhythm No significant change since last  tracing Confirmed by Debby Freiberg [phone]) on 01/02/2015 11:24:43 AM      MDM   Final diagnoses:  HCAP (healthcare-associated pneumonia)    74 y.o. male with pertinent PMH of lung ca st 3 presents with acute on chronic dyspnea, chills, and cough.  Currently in chemo round.  Physical exam today as above.    Wu with post obstructive PNA.  Consulted medicine for admission.  Vanc/zosyn given.  I have reviewed all laboratory and imaging studies if ordered as above  1. HCAP (healthcare-associated pneumonia)         Debby Freiberg, MD 01/03/15 [phone]

## 2015-01-03 DIAGNOSIS — J449 Chronic obstructive pulmonary disease, unspecified: Secondary | ICD-10-CM

## 2015-01-03 DIAGNOSIS — R0602 Shortness of breath: Secondary | ICD-10-CM

## 2015-01-03 LAB — COMPREHENSIVE METABOLIC PANEL
ALBUMIN: 2.9 g/dL — AB (ref 3.5–5.2)
ALT: 19 U/L (ref 0–53)
AST: 21 U/L (ref 0–37)
Alkaline Phosphatase: 62 U/L (ref 39–117)
Anion gap: 8 (ref 5–15)
BILIRUBIN TOTAL: 0.9 mg/dL (ref 0.3–1.2)
BUN: 15 mg/dL (ref 6–23)
CO2: 31 mmol/L (ref 19–32)
CREATININE: 0.83 mg/dL (ref 0.50–1.35)
Calcium: 8.8 mg/dL (ref 8.4–10.5)
Chloride: 98 mmol/L (ref 96–112)
GFR calc non Af Amer: 86 mL/min — ABNORMAL LOW (ref >90)
Glucose, Bld: 132 mg/dL — ABNORMAL HIGH (ref 70–99)
Potassium: 3.8 mmol/L (ref 3.5–5.1)
Sodium: 137 mmol/L (ref 135–145)
TOTAL PROTEIN: 6.3 g/dL (ref 6.0–8.3)

## 2015-01-03 LAB — CBC
HCT: 28.8 % — ABNORMAL LOW (ref 39.0–52.0)
Hemoglobin: 9.9 g/dL — ABNORMAL LOW (ref 13.0–17.0)
MCH: 34.4 pg — AB (ref 26.0–34.0)
MCHC: 34.4 g/dL (ref 30.0–36.0)
MCV: 100 fL (ref 78.0–100.0)
Platelets: 139 10*3/uL — ABNORMAL LOW (ref 150–400)
RBC: 2.88 MIL/uL — ABNORMAL LOW (ref 4.22–5.81)
RDW: 13.8 % (ref 11.5–15.5)
WBC: 2.6 10*3/uL — AB (ref 4.0–10.5)

## 2015-01-03 LAB — PROCALCITONIN

## 2015-01-03 MED ORDER — CALCIUM CARBONATE ANTACID 500 MG PO CHEW
1.0000 | CHEWABLE_TABLET | Freq: Four times a day (QID) | ORAL | Status: DC | PRN
  Administered 2015-01-03: 200 mg via ORAL
  Filled 2015-01-03: qty 1

## 2015-01-03 NOTE — Progress Notes (Signed)
Echocardiogram 2D Echocardiogram has been performed.  Doyle Askew 01/03/2015, 10:13 AM

## 2015-01-03 NOTE — Progress Notes (Signed)
Tyler [name] EQA:[id number] DOB: 1941/04/28 DOA: 01/02/2015 PCP: Tamsen Roers, MD  Brief narrative: 74 y/o ? 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 wob despite intermittent home oxygen that he uses 2 L-cough + white sputum, negative hemoptysis nausea and diarrhea-given Alimta 12/29/14  Past medical history-As per Problem list Chart reviewed as below- Reviewed  Consultants:  None  I have let his oncologist now he is here  Procedures:  None  Antibiotics:  Cefepime 1 g 3/4   Subjective  Feels fair.  Does not like the food here Still coughing a little bit No chills no rigor No chest pain No blurred or double vision  Objective    Interim History:   Telemetry: Sinus   Objective: Filed Vitals:   01/02/15 2108 01/03/15 0532 01/03/15 0856 01/03/15 1405  BP: 134/61 151/69 167/72 140/59  Pulse: 73 65 74 76  Temp: 98.8 F (37.1 C) 98.2 F (36.8 C) 97.9 F (36.6 C) 98.2 F (36.8 C)  TempSrc: Oral Oral Oral Oral  Resp: 18 18 20 18   Height:      Weight:      SpO2: 99% 99% 100% 100%    Intake/Output Summary (Last 24 hours) at 01/03/15 1423 Last data filed at 01/03/15 1036  Gross per 24 hour  Intake    480 ml  Output    850 ml  Net   -370 ml    Exam:  General: EOMI NCAT Cardiovascular: S1-S2 no murmur rub or gallop Respiratory: Increased crackles left lower lung fields Abdomen: Soft nontender nondistended no rebound Skin no lower extremity edema Neuro intact  Data Reviewed: Basic Metabolic Panel:  Recent Labs Lab 12/29/14 0812 01/02/15 1104 01/03/15 0523  NA 142 132* 137  K 3.9 3.5 3.8  CL  --  96 98  CO2 27 29 31   GLUCOSE 100 113* 132*  BUN 16.3 18 15   CREATININE 0.9 0.91 0.83  CALCIUM 9.3 8.7 8.8   Liver Function  Tests:  Recent Labs Lab 12/29/14 0812 01/03/15 0523  AST 19 21  ALT 18 19  ALKPHOS 94 62  BILITOT 0.32 0.9  PROT 6.3* 6.3  ALBUMIN 2.9* 2.9*   No results for input(s): LIPASE, AMYLASE in the last 168 hours. No results for input(s): AMMONIA in the last 168 hours. CBC:  Recent Labs Lab 12/29/14 0813 01/02/15 1104 01/03/15 0523  WBC 5.8 5.1 2.6*  NEUTROABS 3.4  --   --   HGB 11.3* 10.5* 9.9*  HCT 34.0* 31.2* 28.8*  MCV 101.8* 101.0* 100.0  PLT 165 174 139*   Cardiac Enzymes: No results for input(s): CKTOTAL, CKMB, CKMBINDEX, TROPONINI in the last 168 hours. BNP: Invalid input(s): POCBNP CBG: No results for input(s): GLUCAP in the last 168 hours.  Recent Results (from the past 240 hour(s))  Blood culture (routine x 2)     Status: None (Preliminary result)   Collection Time: 01/02/15  3:19 PM  Result Value Ref Range Status   Specimen Description BLOOD LEFT ARM  Final   Special Requests   Final    BOTTLES DRAWN AEROBIC AND ANAEROBIC 5CC BOTH BOTTLES   Culture   Final           BLOOD CULTURE RECEIVED NO GROWTH TO DATE CULTURE WILL BE HELD FOR  5 DAYS BEFORE ISSUING A FINAL NEGATIVE REPORT Performed at [facility]    Report Status PENDING  Incomplete  Blood culture (routine x 2)     Status: None (Preliminary result)   Collection Time: 01/02/15  3:20 PM  Result Value Ref Range Status   Specimen Description BLOOD LEFT HAND  Final   Special Requests BOTTLES DRAWN AEROBIC ONLY 2CC  Final   Culture   Final           BLOOD CULTURE RECEIVED NO GROWTH TO DATE CULTURE WILL BE HELD FOR 5 DAYS BEFORE ISSUING A FINAL NEGATIVE REPORT Performed at [facility]    Report Status PENDING  Incomplete     Studies:              All Imaging reviewed and is as per above notation   Scheduled Meds: . ceFEPime (MAXIPIME) IV  1 g Intravenous 3 times per day  . enoxaparin (LOVENOX) injection  50 mg Subcutaneous Q24H  . escitalopram  20 mg Oral Daily  . latanoprost  1  drop Both Eyes QHS  . nebivolol  10 mg Oral BID  . pantoprazole  40 mg Oral Daily  . tamsulosin  0.4 mg Oral q morning - 10a  . vancomycin  750 mg Intravenous Q12H   Continuous Infusions:    Assessment/Plan: 1. Probable postobstructive pneumonia-continue cefepime 3 times a day 1 g, continue vancomycin 750 every 12-repeat chest x-ray a.m. as CT scan confirmed hospital pneumonia however chest x-ray two-view on 3/4 did not. Might be able to D escalate antibiotics soon-await blood culture 2. Elevated BNP-await echocardiogram-BNP can be elevated in states of infection EKG from admission does not show any LVH criteria 3. Chemotherapy-induced neutropenia -White count down to 2.6, platelet 139. Repeat CBC plus differential in a.m.-I have alerted oncologist that he normally sees as to his presence here. 4. Metastatic NSCLC-hold chemotherapy while inpatient 5. Prior tobacco alcohol abuse-does not use currently. Monitor 6. Depression continue Lexapro 20 mg daily, flurazepam 30 mg daily at bedtime, Elavil 10 mg daily/daily at bedtime on hold as therapeutic duplication with flurazepam daily at bedtime-this needs to be delineated as an outpatient 7. Moderate to poorly controlled hypertension-continue nebivolol 10 twice a day-might need to add thiazide diuretic versus ACE inhibitor as is Caucasian 8. BPH status post TUR-continue Flomax 0.4 every morning\ 9. BMI 28   Code Status: Presumed full Family Communication: None at bedside Disposition Plan: Inpatient pending resolution   Verneita Griffes, MD  [facility] Pager [phone] 01/03/2015, 2:23 PM    LOS: 1 day

## 2015-01-04 DIAGNOSIS — J181 Lobar pneumonia, unspecified organism: Secondary | ICD-10-CM

## 2015-01-04 LAB — COMPREHENSIVE METABOLIC PANEL
ALT: 18 U/L (ref 0–53)
AST: 22 U/L (ref 0–37)
Albumin: 3 g/dL — ABNORMAL LOW (ref 3.5–5.2)
Alkaline Phosphatase: 65 U/L (ref 39–117)
Anion gap: 7 (ref 5–15)
BUN: 13 mg/dL (ref 6–23)
CO2: 30 mmol/L (ref 19–32)
Calcium: 8.6 mg/dL (ref 8.4–10.5)
Chloride: 97 mmol/L (ref 96–112)
Creatinine, Ser: 0.85 mg/dL (ref 0.50–1.35)
GFR, EST NON AFRICAN AMERICAN: 85 mL/min — AB (ref >90)
GLUCOSE: 127 mg/dL — AB (ref 70–99)
POTASSIUM: 3.5 mmol/L (ref 3.5–5.1)
Sodium: 134 mmol/L — ABNORMAL LOW (ref 135–145)
Total Bilirubin: 0.8 mg/dL (ref 0.3–1.2)
Total Protein: 6.2 g/dL (ref 6.0–8.3)

## 2015-01-04 LAB — URINALYSIS, ROUTINE W REFLEX MICROSCOPIC
BILIRUBIN URINE: NEGATIVE
Glucose, UA: NEGATIVE mg/dL
Hgb urine dipstick: NEGATIVE
KETONES UR: NEGATIVE mg/dL
Leukocytes, UA: NEGATIVE
NITRITE: NEGATIVE
Protein, ur: NEGATIVE mg/dL
Specific Gravity, Urine: 1.013 (ref 1.005–1.030)
Urobilinogen, UA: 1 mg/dL (ref 0.0–1.0)
pH: 5.5 (ref 5.0–8.0)

## 2015-01-04 LAB — CBC WITH DIFFERENTIAL/PLATELET
BASOS ABS: 0 10*3/uL (ref 0.0–0.1)
Basophils Relative: 1 % (ref 0–1)
Eosinophils Absolute: 0.1 10*3/uL (ref 0.0–0.7)
Eosinophils Relative: 5 % (ref 0–5)
HCT: 27.7 % — ABNORMAL LOW (ref 39.0–52.0)
Hemoglobin: 9.5 g/dL — ABNORMAL LOW (ref 13.0–17.0)
LYMPHS ABS: 1 10*3/uL (ref 0.7–4.0)
Lymphocytes Relative: 61 % — ABNORMAL HIGH (ref 12–46)
MCH: 34.1 pg — ABNORMAL HIGH (ref 26.0–34.0)
MCHC: 34.3 g/dL (ref 30.0–36.0)
MCV: 99.3 fL (ref 78.0–100.0)
Monocytes Absolute: 0.1 10*3/uL (ref 0.1–1.0)
Monocytes Relative: 5 % (ref 3–12)
NEUTROS ABS: 0.5 10*3/uL — AB (ref 1.7–7.7)
Neutrophils Relative %: 28 % — ABNORMAL LOW (ref 43–77)
Platelets: 140 10*3/uL — ABNORMAL LOW (ref 150–400)
RBC: 2.79 MIL/uL — ABNORMAL LOW (ref 4.22–5.81)
RDW: 13.5 % (ref 11.5–15.5)
WBC: 1.7 10*3/uL — ABNORMAL LOW (ref 4.0–10.5)

## 2015-01-04 LAB — VANCOMYCIN, TROUGH: VANCOMYCIN TR: 10.4 ug/mL (ref 10.0–20.0)

## 2015-01-04 LAB — PROTIME-INR
INR: 1.08 (ref 0.00–1.49)
Prothrombin Time: 14.1 seconds (ref 11.6–15.2)

## 2015-01-04 MED ORDER — TBO-FILGRASTIM 300 MCG/0.5ML ~~LOC~~ SOSY
300.0000 ug | PREFILLED_SYRINGE | Freq: Every day | SUBCUTANEOUS | Status: DC
  Administered 2015-01-04 – 2015-01-05 (×2): 300 ug via SUBCUTANEOUS
  Filled 2015-01-04 (×5): qty 0.5

## 2015-01-04 MED ORDER — VANCOMYCIN HCL IN DEXTROSE 1-5 GM/200ML-% IV SOLN
1000.0000 mg | Freq: Two times a day (BID) | INTRAVENOUS | Status: DC
  Administered 2015-01-05 – 2015-01-06 (×3): 1000 mg via INTRAVENOUS
  Filled 2015-01-04 (×4): qty 200

## 2015-01-04 NOTE — Progress Notes (Addendum)
ANTIBIOTIC CONSULT NOTE - Follow up  Pharmacy Consult for Vancomycin & Cefepime Indication: HCAP, post-obstructive?  Allergies  Allergen Reactions  . Ketorolac Tromethamine Anaphylaxis    REACTION TO Tordal  . Codeine Itching  . Morphine And Related     Broke him out in a rash on abdomen  . Tramadol Rash  . Tussionex Pennkinetic Er [Hydrocod Polst-Cpm Polst Er] Rash   Patient Measurements: Height: 6\' 2"  (188 cm) Weight: 228 lb (103.42 kg) IBW/kg (Calculated) : 82.2  Vital Signs: Temp: 98.2 F (36.8 C) (03/06 0535) Temp Source: Oral (03/06 0535) BP: 135/62 mmHg (03/06 0933) Pulse Rate: 65 (03/06 0933) Intake/Output from previous day: 03/05 0701 - 03/06 0700 In: 3825 [P.O.:720; IV Piggyback:450] Out: 0539 [Urine:1450] Intake/Output from this shift:    Labs:  Recent Labs  01/02/15 1104 01/03/15 0523 01/04/15 0520  WBC 5.1 2.6* 1.7*  HGB 10.5* 9.9* 9.5*  PLT 174 139* 140*  CREATININE 0.91 0.83 0.85   Estimated Creatinine Clearance: 100.8 mL/min (by C-G formula based on Cr of 0.85). No results for input(s): VANCOTROUGH, VANCOPEAK, VANCORANDOM, GENTTROUGH, GENTPEAK, GENTRANDOM, TOBRATROUGH, TOBRAPEAK, TOBRARND, AMIKACINPEAK, AMIKACINTROU, AMIKACIN in the last 72 hours.   Microbiology: Recent Results (from the past 720 hour(s))  Blood culture (routine x 2)     Status: None (Preliminary result)   Collection Time: 01/02/15  3:19 PM  Result Value Ref Range Status   Specimen Description BLOOD LEFT ARM  Final   Special Requests   Final    BOTTLES DRAWN AEROBIC AND ANAEROBIC 5CC BOTH BOTTLES   Culture   Final           BLOOD CULTURE RECEIVED NO GROWTH TO DATE CULTURE WILL BE HELD FOR 5 DAYS BEFORE ISSUING A FINAL NEGATIVE REPORT Performed at [facility]    Report Status PENDING  Incomplete  Blood culture (routine x 2)     Status: None (Preliminary result)   Collection Time: 01/02/15  3:20 PM  Result Value Ref Range Status   Specimen Description BLOOD LEFT  HAND  Final   Special Requests BOTTLES DRAWN AEROBIC ONLY 2CC  Final   Culture   Final           BLOOD CULTURE RECEIVED NO GROWTH TO DATE CULTURE WILL BE HELD FOR 5 DAYS BEFORE ISSUING A FINAL NEGATIVE REPORT Performed at [facility]    Report Status PENDING  Incomplete    Medical History: Past Medical History  Diagnosis Date  . Allergic rhinitis   . BPH (benign prostatic hyperplasia)   . GERD (gastroesophageal reflux disease)   . COPD (chronic obstructive pulmonary disease)   . Shortness of breath   . Arthritis   . Enlarged prostate   . Hypertension     dr Margreta Journey little in [place]   pcp      dr Stanford Breed  cardiac md  . Mental disorder   . Constipation   . Chronic chest pain   . Pneumonia     hx  . Cancer Lung  . Lung cancer 04/11/14    lul carina lesion=Adenocarcinoma   Assessment: 10 y/oF with PMH of NSCLC, last Alimta C4 was 2/29, presents with acute on chronic dyspnea, chills and cough. Pharmacy consulted to dose Cefepime & Vanc for HCAP. Chest CT: probable postobstructive PNA, noted trachea patent, no mention of trach in H&P, other notes.  3/4 >> Vancomycin >> 3/4 >> Cefepime >>   Tmax: AF WBCs: falling, 1.7. Granix 357mcg given 3/6. Renal: SCr wnl,  CrCl ~ 100CG  3/4 blood x 2: ngtd  Goal of Therapy:  Appropriate antibiotic dosing for renal function and indication Eradication of infection Vancomycin trough 15-20 mcg/ml  Plan:  Cont Cefepime 1g IV q8h. Cont Vancomycin 750mg  IV q12h. Measure Vanc trough this afternoon. Follow up renal fxn, culture results, and clinical course.  Romeo Rabon, PharmD, pager [phone]. 01/04/2015,11:14 AM.  Addendum:  Vanc trough = 10.4mg /l. Will increase to 1g IV q12h.  Romeo Rabon, PharmD, pager [phone]. 01/04/2015,4:58 PM.

## 2015-01-04 NOTE — Progress Notes (Signed)
INITIAL NUTRITION ASSESSMENT  DOCUMENTATION CODES Per approved criteria  -Not Applicable   INTERVENTION: Encourage PO intake  NUTRITION DIAGNOSIS: Increased nutrient needs related to lung cancer and associated treatments as evidenced by estimated nutritional needs.   Goal: Pt to meet >/= 90% of their estimated nutrition needs   Monitor:  PO and supplemental intake, weight, labs, I/O's  Reason for Assessment: Pt identified as at nutrition risk on the Malnutrition Screen Tool  Admitting Dx: Shortness of breath  ASSESSMENT: 74 year old male with a history of COPD, hypertension, non-small cell lung carcinoma diagnosed in June 2015 (Dr. Julien Nordmann), depression presents with 3 day history of worsening shortness of breath.The patient last received chemotherapy proximally 4 days prior to this admission.   Pt reports poor appetite but seems to be eating well here. PO intake 95-100%. Pt states that he wasn't eating well for 3 days PTA. Last chemotherapy was 2/29.  Pt's weight is stable. Encouraged pt to consume small frequent meals, emphasizing protein consumption.  Pt does not like nutritional supplements like Ensure or Boost. Pt declines other supplement options at this time.  Nutrition focused physical exam shows no sign of depletion of muscle mass or body fat.  Labs reviewed: Low Na  Height: Ht Readings from Last 1 Encounters:  01/02/15 6\' 2"  (1.88 m)    Weight: Wt Readings from Last 1 Encounters:  01/02/15 228 lb (103.42 kg)    Ideal Body Weight: 190 lb  % Ideal Body Weight: 120%  Wt Readings from Last 10 Encounters:  01/02/15 228 lb (103.42 kg)  12/29/14 228 lb 3.2 oz (103.511 kg)  12/08/14 226 lb 14.4 oz (102.921 kg)  11/17/14 216 lb 6.4 oz (98.158 kg)  10/27/14 220 lb 11.2 oz (100.109 kg)  09/29/14 226 lb 6.4 oz (102.694 kg)  09/08/14 221 lb 6.4 oz (100.426 kg)  08/26/14 220 lb 6.4 oz (99.973 kg)  08/18/14 223 lb 11.2 oz (101.47 kg)  07/28/14 219 lb 9.6 oz (99.61  kg)    Usual Body Weight: 222 lb -per pt  % Usual Body Weight: 103%  BMI:  Body mass index is 29.26 kg/(m^2).  Estimated Nutritional Needs: Kcal: 2200-2400 Protein: 110-120g Fluid: 2.2L/day  Skin: intact  Diet Order: Diet regular  EDUCATION NEEDS: -Education needs addressed   Intake/Output Summary (Last 24 hours) at 01/04/15 1025 Last data filed at 01/04/15 0536  Gross per 24 hour  Intake    930 ml  Output   1050 ml  Net   -120 ml    Last BM: 3/1  Labs:   Recent Labs Lab 01/02/15 1104 01/03/15 0523 01/04/15 0520  NA 132* 137 134*  K 3.5 3.8 3.5  CL 96 98 97  CO2 29 31 30   BUN 18 15 13   CREATININE 0.91 0.83 0.85  CALCIUM 8.7 8.8 8.6  GLUCOSE 113* 132* 127*    CBG (last 3)  No results for input(s): GLUCAP in the last 72 hours.  Scheduled Meds: . ceFEPime (MAXIPIME) IV  1 g Intravenous 3 times per day  . enoxaparin (LOVENOX) injection  50 mg Subcutaneous Q24H  . escitalopram  20 mg Oral Daily  . latanoprost  1 drop Both Eyes QHS  . nebivolol  10 mg Oral BID  . pantoprazole  40 mg Oral Daily  . tamsulosin  0.4 mg Oral q morning - 10a  . Tbo-Filgrastim  300 mcg Subcutaneous q1800  . vancomycin  750 mg Intravenous Q12H    Continuous Infusions:   Past Medical History  Diagnosis Date  . Allergic rhinitis   . BPH (benign prostatic hyperplasia)   . GERD (gastroesophageal reflux disease)   . COPD (chronic obstructive pulmonary disease)   . Shortness of breath   . Arthritis   . Enlarged prostate   . Hypertension     dr Margreta Journey little in [place]   pcp      dr Stanford Breed  cardiac md  . Mental disorder   . Constipation   . Chronic chest pain   . Pneumonia     hx  . Cancer Lung  . 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 Ankle surgery  2005  . Prostate surgery  11/25/2010  . Hernia repair    . Mediastinoscopy  06/11/2012    Procedure: MEDIASTINOSCOPY;  Surgeon: Grace Isaac, MD;  Location: [facility];  Service: Thoracic;  Laterality: N/A;  . Inguinal hernia repair      left  . Eye surgery      cataract right  . Video bronchoscopy with endobronchial ultrasound  10/08/2012    Procedure: VIDEO BRONCHOSCOPY WITH ENDOBRONCHIAL ULTRASOUND;  Surgeon: Collene Gobble, MD;  Location: [facility];  Service: Pulmonary;  Laterality: N/A;  . Video bronchoscopy Bilateral 04/11/2014    Procedure: VIDEO BRONCHOSCOPY WITHOUT FLUORO;  Surgeon: Collene Gobble, MD;  Location: [facility];  Service: Cardiopulmonary;  Laterality: Bilateral;    Clayton Bibles, MS, RD, LDN Pager: [phone] After Hours Pager: [phone]

## 2015-01-04 NOTE — Progress Notes (Signed)
Tyler [name] IWL:[id number] DOB: 11/19/1940 DOA: 01/02/2015 PCP: Tamsen Roers, MD  Brief narrative: 74 y/o ? 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 wob despite intermittent home oxygen that he uses 2 L-cough + white sputum, negative hemoptysis nausea and diarrhea-given Alimta 12/29/14  Past medical history-As per Problem list Chart reviewed as below- Reviewed  Consultants:  None  I have let his oncologist now he is here  Procedures:  None  Antibiotics:  Cefepime 1 g 3/4\  Vancomycin 3/4   Subjective   Doing fair. Has some burning in his urine-known to have prostate issues No chest pain, no chills or fever   Objective    Interim History:   Telemetry: Sinus   Objective: Filed Vitals:   01/03/15 2123 01/04/15 0535 01/04/15 0933 01/04/15 1405  BP: 135/53 119/59 135/62 140/64  Pulse: 63 58 65 77  Temp: 98.6 F (37 C) 98.2 F (36.8 C)  98 F (36.7 C)  TempSrc: Oral Oral  Oral  Resp: 18 18  18   Height:      Weight:      SpO2: 97% 98% 98% 97%    Intake/Output Summary (Last 24 hours) at 01/04/15 1550 Last data filed at 01/04/15 1312  Gross per 24 hour  Intake   1330 ml  Output   1650 ml  Net   -320 ml    Exam:  General: EOMI NCAT Cardiovascular: S1-S2 no murmur rub or gallop Respiratory: Increased crackles left lower lung fields Abdomen: Soft nontender nondistended no rebound Skin no lower extremity edema Neuro intact  Data Reviewed: Basic Metabolic Panel:  Recent Labs Lab 12/29/14 0812  01/02/15 1104 01/03/15 0523 01/04/15 0520  NA 142  --  132* 137 134*  K 3.9  < > 3.5 3.8 3.5  CL  --   --  96 98 97  CO2 27  --  29 31 30   GLUCOSE 100  --  113* 132* 127*  BUN 16.3  --  18 15 13   CREATININE 0.9  --  0.91 0.83  0.85  CALCIUM 9.3  --  8.7 8.8 8.6  < > = values in this interval not displayed. Liver Function Tests:  Recent Labs Lab 12/29/14 0812 01/03/15 0523 01/04/15 0520  AST 19 21 22   ALT 18 19 18   ALKPHOS 94 62 65  BILITOT 0.32 0.9 0.8  PROT 6.3* 6.3 6.2  ALBUMIN 2.9* 2.9* 3.0*   No results for input(s): LIPASE, AMYLASE in the last 168 hours. No results for input(s): AMMONIA in the last 168 hours. CBC:  Recent Labs Lab 12/29/14 0813 01/02/15 1104 01/03/15 0523 01/04/15 0520  WBC 5.8 5.1 2.6* 1.7*  NEUTROABS 3.4  --   --  0.5*  HGB 11.3* 10.5* 9.9* 9.5*  HCT 34.0* 31.2* 28.8* 27.7*  MCV 101.8* 101.0* 100.0 99.3  PLT 165 174 139* 140*   Cardiac Enzymes: No results for input(s): CKTOTAL, CKMB, CKMBINDEX, TROPONINI in the last 168 hours. BNP: Invalid input(s): POCBNP CBG: No results for input(s): GLUCAP in the last 168 hours.  Recent Results (from the past 240 hour(s))  Blood culture (routine x 2)     Status: None (Preliminary result)   Collection Time: 01/02/15  3:19 PM  Result Value Ref Range Status   Specimen  Description BLOOD LEFT ARM  Final   Special Requests   Final    BOTTLES DRAWN AEROBIC AND ANAEROBIC 5CC BOTH BOTTLES   Culture   Final           BLOOD CULTURE RECEIVED NO GROWTH TO DATE CULTURE WILL BE HELD FOR 5 DAYS BEFORE ISSUING A FINAL NEGATIVE REPORT Performed at [facility]    Report Status PENDING  Incomplete  Blood culture (routine x 2)     Status: None (Preliminary result)   Collection Time: 01/02/15  3:20 PM  Result Value Ref Range Status   Specimen Description BLOOD LEFT HAND  Final   Special Requests BOTTLES DRAWN AEROBIC ONLY 2CC  Final   Culture   Final           BLOOD CULTURE RECEIVED NO GROWTH TO DATE CULTURE WILL BE HELD FOR 5 DAYS BEFORE ISSUING A FINAL NEGATIVE REPORT Performed at [facility]    Report Status PENDING  Incomplete     Studies:              All Imaging reviewed and is as per above notation    Scheduled Meds: . ceFEPime (MAXIPIME) IV  1 g Intravenous 3 times per day  . enoxaparin (LOVENOX) injection  50 mg Subcutaneous Q24H  . escitalopram  20 mg Oral Daily  . latanoprost  1 drop Both Eyes QHS  . nebivolol  10 mg Oral BID  . pantoprazole  40 mg Oral Daily  . tamsulosin  0.4 mg Oral q morning - 10a  . Tbo-Filgrastim  300 mcg Subcutaneous q1800  . vancomycin  750 mg Intravenous Q12H   Continuous Infusions:    Assessment/Plan:  1. Probable postobstructive pneumonia-continue cefepime 3 times a day 1 g, continue vancomycin 750 every 12-repeat chest x-ray a.m. as CT scan confirmed hospital pneumonia however chest x-ray two-view on 3/4 did not. Might be able to D escalate antibiotics soon-await blood culture from 3/4 2. Elevated BNP-await echocardiogram-BNP can be elevated in states of infection EKG from admission does not show any LVH criteria 3. Chemotherapy-induced neutropenia -secondary to Alimta, ANC 500-started Granix 300 g daily under the instruction of Dr. Sabino Niemann. Earlie Server to comment on this 4. Metastatic NSCLC-hold chemotherapy while inpatient 5. Prior tobacco alcohol abuse-does not use currently. Monitor 6. Depression continue Lexapro 20 mg daily, flurazepam 30 mg daily at bedtime, Elavil 10 mg daily/daily at bedtime on hold as therapeutic duplication with flurazepam daily at bedtime-this needs to be delineated as an outpatient 7. Moderate controlled hypertension-continue nebivolol 10 twice a day 8. BPH status post TUR-continue Flomax 0.4 every morning-we will get a UA as he was having some dysuria on 3/6  9. BMI 28   Code Status: Presumed full Family Communication: None at bedside Disposition Plan: Inpatient pending resolution   20 minutes  Verneita Griffes, MD  [facility] Pager [phone] 01/04/2015, 3:50 PM    LOS: 2 days

## 2015-01-05 LAB — COMPREHENSIVE METABOLIC PANEL
ALK PHOS: 63 U/L (ref 39–117)
ALT: 16 U/L (ref 0–53)
ANION GAP: 5 (ref 5–15)
AST: 23 U/L (ref 0–37)
Albumin: 3 g/dL — ABNORMAL LOW (ref 3.5–5.2)
BILIRUBIN TOTAL: 0.8 mg/dL (ref 0.3–1.2)
BUN: 11 mg/dL (ref 6–23)
CHLORIDE: 101 mmol/L (ref 96–112)
CO2: 31 mmol/L (ref 19–32)
Calcium: 8.7 mg/dL (ref 8.4–10.5)
Creatinine, Ser: 0.83 mg/dL (ref 0.50–1.35)
GFR, EST NON AFRICAN AMERICAN: 86 mL/min — AB (ref >90)
GLUCOSE: 117 mg/dL — AB (ref 70–99)
POTASSIUM: 3.4 mmol/L — AB (ref 3.5–5.1)
Sodium: 137 mmol/L (ref 135–145)
TOTAL PROTEIN: 6.2 g/dL (ref 6.0–8.3)

## 2015-01-05 LAB — URINE CULTURE
Colony Count: NO GROWTH
Culture: NO GROWTH

## 2015-01-05 LAB — CBC WITH DIFFERENTIAL/PLATELET
BASOS ABS: 0 10*3/uL (ref 0.0–0.1)
Basophils Relative: 0 % (ref 0–1)
Eosinophils Absolute: 0.1 10*3/uL (ref 0.0–0.7)
Eosinophils Relative: 5 % (ref 0–5)
HEMATOCRIT: 28.2 % — AB (ref 39.0–52.0)
HEMOGLOBIN: 9.6 g/dL — AB (ref 13.0–17.0)
LYMPHS ABS: 1.1 10*3/uL (ref 0.7–4.0)
LYMPHS PCT: 42 % (ref 12–46)
MCH: 33.6 pg (ref 26.0–34.0)
MCHC: 34 g/dL (ref 30.0–36.0)
MCV: 98.6 fL (ref 78.0–100.0)
Monocytes Absolute: 0.3 10*3/uL (ref 0.1–1.0)
Monocytes Relative: 13 % — ABNORMAL HIGH (ref 3–12)
NEUTROS PCT: 40 % — AB (ref 43–77)
Neutro Abs: 1.1 10*3/uL — ABNORMAL LOW (ref 1.7–7.7)
Platelets: 100 10*3/uL — ABNORMAL LOW (ref 150–400)
RBC: 2.86 MIL/uL — AB (ref 4.22–5.81)
RDW: 13.6 % (ref 11.5–15.5)
WBC: 2.6 10*3/uL — ABNORMAL LOW (ref 4.0–10.5)

## 2015-01-05 LAB — LEGIONELLA ANTIGEN, URINE

## 2015-01-05 LAB — PROCALCITONIN

## 2015-01-05 MED ORDER — HYDROXYZINE HCL 10 MG/5ML PO SYRP
10.0000 mg | ORAL_SOLUTION | Freq: Four times a day (QID) | ORAL | Status: DC | PRN
  Administered 2015-01-05: 10 mg via ORAL
  Filled 2015-01-05 (×2): qty 5

## 2015-01-05 NOTE — Progress Notes (Signed)
Clinical Social Work Department BRIEF PSYCHOSOCIAL ASSESSMENT 01/05/2015  Patient:  Tyler [name], Tyler [name]     Account Number:  [id number]     Admit date:  01/02/2015  Clinical Social Worker:  Peri Maris, [place]  Date/Time:  01/05/2015 03:37 PM  Referred by:  Physician  Date Referred:  01/05/2015 Referred for  SNF Placement   Other Referral:   Interview type:  Patient Other interview type:    PSYCHOSOCIAL DATA Living Status:  ALONE Admitted from facility:   Level of care:   Primary support name:  [name],[name]- daughter Primary support relationship to patient:  CHILD, ADULT Degree of support available:   Lacking    CURRENT CONCERNS Current Concerns  Post-Acute Placement   Other Concerns:    SOCIAL WORK ASSESSMENT / PLAN CSW received referral to assess for Pt desire to go to SNF for rehab. Pt reports that  he understands that he will need to go to a rehab facility for futher rehab. Pt reports having minimal support in his home environment as his children do not live close. Pt is requesting a referral to [facility]; CSW agreeable to make a referral there but discussed with Pt the necessity to fax to other facilities. pt reports that he wants to stay in [facility].   Assessment/plan status:  Psychosocial Support/Ongoing Assessment of Needs Other assessment/ plan:   N/A   Information/referral to community resources:   Pt provided with list of SNF facilities.    PATIENT'S/FAMILY'S RESPONSE TO PLAN OF CARE: Pt is agreeable to short term placement in a [facility] facility and specifically requests [facility]. Pt reports wanting to be close to his sister who is also at [facility] as a long term resident. Pt expresses wanting to get stronger so he can achieve his normal level of functioning. Pt was pleasant during assessment and was appreciative of list of facilities and information provided.     Peri Maris, [place] 01/05/2015 3:45 PM

## 2015-01-05 NOTE — Evaluation (Signed)
Physical Therapy Evaluation Patient Details Name: Tyler [name] MRN: [id number] DOB: 1941/04/17 Today's Date: 01/05/2015   History of Present Illness  74 year old male with a history of COPD, hypertension, non-small cell lung carcinoma diagnosed in June 2015 (Dr. Julien Nordmann), depression presents with 3 day history of worsening shortness of breath. At baseline, the patient uses 2 L nasal cannula when necessary activity and shortness of breath at home  Clinical Impression  Pt admitted with above diagnosis. Pt currently with functional limitations due to the deficits listed below (see PT Problem List).  Pt will benefit from skilled PT to increase their independence and safety with mobility to allow discharge to the venue listed below.  Pt doesn't feel he can manage at home at his current status, PT in agreement, will likely need STSNF      Follow Up Recommendations SNF;Supervision for mobility/OOB    Equipment Recommendations  None recommended by PT    Recommendations for Other Services       Precautions / Restrictions Precautions Precautions: Fall Precaution Comments: oxygen dependent at home per pt Restrictions Weight Bearing Restrictions: No      Mobility  Bed Mobility Overal bed mobility: Needs Assistance Bed Mobility: Sit to Supine       Sit to supine: Supervision      Transfers Overall transfer level: Needs assistance Equipment used: None Transfers: Sit to/from [facility] Sit to Stand: Min assist Stand pivot transfers: Min assist       General transfer comment: cues for safety awareness  Ambulation/Gait Ambulation/Gait assistance: Min assist;Min guard Ambulation Distance (Feet): 50 Feet Assistive device: None Gait Pattern/deviations: Shuffle;Drifts right/left;Trunk flexed     General Gait Details: pt faitgues quickly during gait but sats remain >93% on RA; sats >95% on RA at rest; DOE 3/4; cues for incr step length  Stairs             Wheelchair Mobility    Modified Rankin (Stroke Patients Only)       Balance Overall balance assessment: Needs assistance           Standing balance-Leahy Scale: Good Standing balance comment: pt found up in bathroom having gotten in there by himself; one sock on and one off;                              Pertinent Vitals/Pain Pain Assessment: 0-10 Pain Score: 5  Pain Location: back Pain Descriptors / Indicators: Constant Pain Intervention(s): Monitored during session;Limited activity within patient's tolerance    Home Living Family/patient expects to be discharged to:: Private residence Living Arrangements: Alone   Type of Home: House Home Access: Stairs to enter;Ramped entrance   Entrance Stairs-Number of Steps: 3 Home Layout: One level Home Equipment: Walker - 2 wheels;Bedside commode Additional Comments: pt admits to furniture walking  at home    Prior Function Level of Independence: Independent               Hand Dominance        Extremity/Trunk Assessment   Upper Extremity Assessment: Defer to OT evaluation           Lower Extremity Assessment: Generalized weakness         Communication   Communication: No difficulties  Cognition Arousal/Alertness: Awake/alert Behavior During Therapy: WFL for tasks assessed/performed Overall Cognitive Status: Within Functional Limits for tasks assessed  General Comments      Exercises        Assessment/Plan    PT Assessment Patient needs continued PT services  PT Diagnosis Difficulty walking   PT Problem List Decreased activity tolerance;Decreased balance;Decreased mobility  PT Treatment Interventions DME instruction;Gait training;Functional mobility training;Therapeutic activities;Balance training;Therapeutic exercise   PT Goals (Current goals can be found in the Care Plan section) Acute Rehab PT Goals Patient Stated Goal: go to rehab PT Goal  Formulation: With patient Time For Goal Achievement: 01/19/15 Potential to Achieve Goals: Good    Frequency Min 3X/week   Barriers to discharge        Co-evaluation PT/OT/SLP Co-Evaluation/Treatment: Yes Reason for Co-Treatment: Other (comment) (endurance very limited) PT goals addressed during session: Mobility/safety with mobility         End of Session   Activity Tolerance: Patient limited by fatigue Patient left: in bed;with call bell/phone within reach, bed alarm on; Nurse Communication: Mobility status         Time: 8416-6063 PT Time Calculation (min) (ACUTE ONLY): 19 min   Charges:   PT Evaluation $Initial PT Evaluation Tier I: 1 Procedure     PT G CodesKenyon Ana 01/24/15, 11:12 AM

## 2015-01-05 NOTE — Evaluation (Signed)
Occupational Therapy Evaluation Patient Details Name: Tyler [name] MRN: [id number] DOB: Jun 20, 1941 Today's Date: 01/05/2015    History of Present Illness 74 year old male with a history of COPD, hypertension, non-small cell lung carcinoma diagnosed in June 2015 (Dr. Julien Nordmann), depression presents with 3 day history of worsening shortness of breath. At baseline, the patient uses 2 L nasal cannula when necessary activity and shortness of breath at home   Clinical Impression   Pt admitted with SOB. Pt currently with functional limitations due to the deficits listed below (see OT Problem List).  Pt will benefit from skilled OT to increase their safety and independence with ADL and functional mobility for ADL to facilitate discharge to venue listed below.      Follow Up Recommendations  SNF    Equipment Recommendations  None recommended by OT    Recommendations for Other Services       Precautions / Restrictions Precautions Precautions: Fall Precaution Comments: oxygen Restrictions Weight Bearing Restrictions: No      Mobility Bed Mobility Overal bed mobility: Needs Assistance Bed Mobility: Sit to Supine       Sit to supine: Supervision      Transfers Overall transfer level: Needs assistance   Transfers: Sit to/from Stand;Stand Pivot Transfers Sit to Stand: Min assist Stand pivot transfers: Min assist                 ADL Overall ADL's : Needs assistance/impaired Eating/Feeding: Supervision/ safety   Grooming: Minimal assistance;Standing   Upper Body Bathing: Minimal assitance;Sitting   Lower Body Bathing: Moderate assistance;Sit to/from stand   Upper Body Dressing : Set up;Sitting       Toilet Transfer: Moderate assistance Toilet Transfer Details (indicate cue type and reason): pt unsteady         Functional mobility during ADLs: Moderate assistance General ADL Comments: pt fatigues VERY quickly. sats 94-95 even with fatigue              Pertinent Vitals/Pain Pain Assessment: 0-10 Pain Score: 5  Pain Location: back Pain Descriptors / Indicators: Constant Pain Intervention(s): Monitored during session;Limited activity within patient's tolerance     Hand Dominance     Extremity/Trunk Assessment Upper Extremity Assessment Upper Extremity Assessment: Generalized weakness           Communication     Cognition Arousal/Alertness: Awake/alert Behavior During Therapy: WFL for tasks assessed/performed Overall Cognitive Status: Within Functional Limits for tasks assessed                                Home Living Family/patient expects to be discharged to:: Private residence Living Arrangements: Alone   Type of Home: House Home Access: Stairs to enter;Ramped entrance Entrance Stairs-Number of Steps: 3   Home Layout: One level     Bathroom Shower/Tub: Teacher, early years/pre: Standard     Home Equipment: Environmental consultant - 2 wheels;Bedside commode               OT Diagnosis: Generalized weakness   OT Problem List: Decreased strength;Decreased activity tolerance   OT Treatment/Interventions: Self-care/ADL training;Patient/family education;DME and/or AE instruction    OT Goals(Current goals can be found in the care plan section) Acute Rehab OT Goals Patient Stated Goal: go to rehab OT Goal Formulation: With patient Time For Goal Achievement: 01/19/15 Potential to Achieve Goals: Good  OT Frequency: Min 2X/week   Barriers to D/C:  Co-evaluation              End of Session Nurse Communication: Mobility status  Activity Tolerance: Patient limited by fatigue Patient left: in bed   Time: 1005-1024 OT Time Calculation (min): 19 min Charges:  OT General Charges $OT Visit: 1 Procedure OT Evaluation $Initial OT Evaluation Tier I: 1 Procedure G-Codes:    Betsy Pries 01-18-15, 10:56 AM

## 2015-01-05 NOTE — Progress Notes (Signed)
Tyler [name] [id number]:[id number] DOB: 1941-02-05 DOA: 01/02/2015 PCP: Tamsen Roers, MD  Brief narrative: 74 y/o ? 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 wob despite intermittent home oxygen that he uses 2 L-cough + white sputum, negative hemoptysis nausea and diarrhea-given Alimta 12/29/14 ANC dropped 540-Given Granix 300 MCG   Past medical history-As per Problem list Chart reviewed as below- Reviewed  Consultants:  None  I have let his oncologist now he is here  Procedures:  None  Antibiotics:  Cefepime 1 g 3/4  Vancomycin 3/4   Subjective   Improved C/o RLE itching and scratching No f/chill, rigor, dysuria   Objective    Interim History:   Telemetry: Sinus   Objective: Filed Vitals:   01/04/15 1405 01/04/15 2110 01/05/15 0514 01/05/15 1423  BP: 140/64 119/98 119/56 136/65  Pulse: 77 77 62 68  Temp: 98 F (36.7 C) 98.4 F (36.9 C) 98 F (36.7 C) 98.2 F (36.8 C)  TempSrc: Oral Oral Oral Oral  Resp: 18 18 18 18   Height:      Weight:      SpO2: 97% 97% 97% 96%    Intake/Output Summary (Last 24 hours) at 01/05/15 1607 Last data filed at 01/05/15 1538  Gross per 24 hour  Intake   1170 ml  Output   1150 ml  Net     20 ml    Exam:  General: EOMI NCAT Cardiovascular: S1-S2 no murmur rub or gallop Respiratory: Increased crackles left lower lung fields Abdomen: Soft nontender nondistended no rebound Skin no lower extremity edema--mild excoriation RLE Neuro intact  Data Reviewed: Basic Metabolic Panel:  Recent Labs Lab 01/02/15 1104 01/03/15 0523 01/04/15 0520 01/05/15 0540  NA 132* 137 134* 137  K 3.5 3.8 3.5 3.4*  CL 96 98 97 101  CO2 29 31 30 31   GLUCOSE 113* 132* 127* 117*  BUN 18 15 13 11   CREATININE 0.91  0.83 0.85 0.83  CALCIUM 8.7 8.8 8.6 8.7   Liver Function Tests:  Recent Labs Lab 01/03/15 0523 01/04/15 0520 01/05/15 0540  AST 21 22 23   ALT 19 18 16   ALKPHOS 62 65 63  BILITOT 0.9 0.8 0.8  PROT 6.3 6.2 6.2  ALBUMIN 2.9* 3.0* 3.0*   No results for input(s): LIPASE, AMYLASE in the last 168 hours. No results for input(s): AMMONIA in the last 168 hours. CBC:  Recent Labs Lab 01/02/15 1104 01/03/15 0523 01/04/15 0520 01/05/15 0540  WBC 5.1 2.6* 1.7* 2.6*  NEUTROABS  --   --  0.5* 1.1*  HGB 10.5* 9.9* 9.5* 9.6*  HCT 31.2* 28.8* 27.7* 28.2*  MCV 101.0* 100.0 99.3 98.6  PLT 174 139* 140* 100*   Cardiac Enzymes: No results for input(s): CKTOTAL, CKMB, CKMBINDEX, TROPONINI in the last 168 hours. BNP: Invalid input(s): POCBNP CBG: No results for input(s): GLUCAP in the last 168 hours.  Recent Results (from the past 240 hour(s))  Blood culture (routine x 2)     Status: None (Preliminary result)   Collection Time: 01/02/15  3:19 PM  Result Value Ref Range Status   Specimen Description BLOOD LEFT ARM  Final   Special Requests   Final    BOTTLES DRAWN AEROBIC AND ANAEROBIC 5CC BOTH BOTTLES   Culture   Final  BLOOD CULTURE RECEIVED NO GROWTH TO DATE CULTURE WILL BE HELD FOR 5 DAYS BEFORE ISSUING A FINAL NEGATIVE REPORT Performed at [facility]    Report Status PENDING  Incomplete  Blood culture (routine x 2)     Status: None (Preliminary result)   Collection Time: 01/02/15  3:20 PM  Result Value Ref Range Status   Specimen Description BLOOD LEFT HAND  Final   Special Requests BOTTLES DRAWN AEROBIC ONLY 2CC  Final   Culture   Final           BLOOD CULTURE RECEIVED NO GROWTH TO DATE CULTURE WILL BE HELD FOR 5 DAYS BEFORE ISSUING A FINAL NEGATIVE REPORT Performed at [facility]    Report Status PENDING  Incomplete     Studies:              All Imaging reviewed and is as per above notation   Scheduled Meds: . ceFEPime (MAXIPIME) IV  1 g  Intravenous 3 times per day  . enoxaparin (LOVENOX) injection  50 mg Subcutaneous Q24H  . escitalopram  20 mg Oral Daily  . latanoprost  1 drop Both Eyes QHS  . nebivolol  10 mg Oral BID  . pantoprazole  40 mg Oral Daily  . tamsulosin  0.4 mg Oral q morning - 10a  . Tbo-Filgrastim  300 mcg Subcutaneous q1800  . vancomycin  1,000 mg Intravenous Q12H   Continuous Infusions:    Assessment/Plan:  1. Probable postobstructive pneumonia- cefepime 3 times a day 1 Vancomycin 750 every 12--->Levaquin.  repeat chest x-ray a.m.  BC x 2 neg 2. Elevated BNP-await echocardiogram-BNP can be elevated in states of infection EKG from admission does not show any LVH criteria 3. Chemotherapy-induced neutropenia -secondary to Alimta, ANC 500-->1100 3/7-continue Granix 300 g daily under the instruction of Dr. Sabino Niemann. Earlie Server to comment on this 4. Metastatic NSCLC-hold chemotherapy while inpatient 5. Prior tobacco alcohol abuse-does not use currently. Monitor 6. Depression continue Lexapro 20 mg daily, flurazepam 30 mg daily at bedtime, Elavil 10 mg daily/daily at bedtime on hold as therapeutic duplication with flurazepam daily at bedtime-this needs to be delineated as an outpatient 7. Moderate controlled hypertension-continue nebivolol 10 twice a day 8. BPH status post TUR-continue Flomax 0.4 every morning- 9. Asymptomatic UA 2/2 to dysuria on 3/6 -UC pending 10. BMI 28   Code Status: Presumed full Family Communication: discussed c sister bedside Disposition Plan: Inpatient pending resolution--Probable d/c if ANC ? in am and no F or Tmax 3/8  20 minutes  Verneita Griffes, MD  [facility] Pager [phone] 01/05/2015, 4:07 PM    LOS: 3 days

## 2015-01-05 NOTE — Progress Notes (Addendum)
Clinical Social Work Department CLINICAL SOCIAL WORK PLACEMENT NOTE 01/05/2015  Patient:  [name], [name]  Account Number:  [id number] Admit date:  01/02/2015  Clinical Social Worker:  Peri Maris, CLINICAL SOCIAL WORKER  Date/time:  01/05/2015 03:30 PM  Clinical Social Work is seeking post-discharge placement for this patient at the following level of care:   SKILLED NURSING   (*CSW will update this form in Epic as items are completed)   01/05/2015  Patient/family provided with [place] Department of Clinical Social Work's list of facilities offering this level of care within the geographic area requested by the patient (or if unable, by the patient's family).  01/05/2015  Patient/family informed of their freedom to choose among providers that offer the needed level of care, that participate in Medicare, Medicaid or managed care program needed by the patient, have an available bed and are willing to accept the patient.  01/05/2015  Patient/family informed of [facility]' ownership interest in [facility], as well as of the fact that they are under no obligation to receive care at this facility.  PASARR submitted to EDS on existing PASARR number received on   FL2 transmitted to all facilities in geographic area requested by pt/family on  01/05/2015 FL2 transmitted to all facilities within larger geographic area on   Patient informed that his/her managed care company has contracts with or will negotiate with  certain facilities, including the following:     Patient/family informed of bed offers received:  01/05/15 Patient chooses bed at [facility] Physician recommends and patient chooses bed at    Patient to be transferred to [facility] on  01/08/15 Patient to be transferred to facility by [facility] Patient and family notified of transfer on 01/08/15 Name of family member notified:  [name]-dtr via phone  The following physician request were entered in Epic:   Additional  Comments:   Peri Maris, [place] 01/05/2015 3:48 PM

## 2015-01-05 NOTE — Clinical Documentation Improvement (Signed)
Supporting Information:  74 year old male with a history of COPD, hypertension, non-small cell lung carcinoma diagnosed in June 2015 (Dr. Julien Nordmann), depression presents with 3 day history of worsening shortness of breath. At baseline, the patient uses 2 L nasal cannula when necessary activity and shortness of breath at home per 3/04 progress notes.     Possible Clinical Condition? . Document acuity: --Acute Respiratory Failure --Chronic Respiratory Failure --Acute on Chronic Respiratory Failure . Document inclusion of: --Hypoxia --Hypercapnia . Document tobacco: --Use --Abuse --History of . Document any associated diagnoses/conditions     Thank Sherian Maroon Documentation Specialist [phone] Lavana [email or web address]

## 2015-01-05 NOTE — Progress Notes (Signed)
Around 2230 Patient complained of chest pain.  Pain seem to be unrelieved with 15mg  of OxyIR.  VS 156/74, P 74 R18  ,Sat 100. Notified NP on call, EKG ordered. 12 lead EKG showed NSR.  Monitoring patient closely, Patient is on TELE, Informed NP.

## 2015-01-06 ENCOUNTER — Encounter (HOSPITAL_COMMUNITY): Payer: Self-pay | Admitting: Surgery

## 2015-01-06 ENCOUNTER — Inpatient Hospital Stay (HOSPITAL_COMMUNITY): Payer: Medicare Other

## 2015-01-06 DIAGNOSIS — R0789 Other chest pain: Secondary | ICD-10-CM

## 2015-01-06 DIAGNOSIS — K810 Acute cholecystitis: Secondary | ICD-10-CM

## 2015-01-06 LAB — CBC WITH DIFFERENTIAL/PLATELET
Basophils Absolute: 0.1 10*3/uL (ref 0.0–0.1)
Basophils Relative: 1 % (ref 0–1)
EOS PCT: 2 % (ref 0–5)
Eosinophils Absolute: 0.1 10*3/uL (ref 0.0–0.7)
HCT: 29.1 % — ABNORMAL LOW (ref 39.0–52.0)
Hemoglobin: 9.8 g/dL — ABNORMAL LOW (ref 13.0–17.0)
LYMPHS ABS: 0.8 10*3/uL (ref 0.7–4.0)
Lymphocytes Relative: 16 % (ref 12–46)
MCH: 32.9 pg (ref 26.0–34.0)
MCHC: 33.7 g/dL (ref 30.0–36.0)
MCV: 97.7 fL (ref 78.0–100.0)
MONOS PCT: 13 % — AB (ref 3–12)
Monocytes Absolute: 0.7 10*3/uL (ref 0.1–1.0)
Neutro Abs: 3.4 10*3/uL (ref 1.7–7.7)
Neutrophils Relative %: 68 % (ref 43–77)
PLATELETS: 89 10*3/uL — AB (ref 150–400)
RBC: 2.98 MIL/uL — ABNORMAL LOW (ref 4.22–5.81)
RDW: 13.6 % (ref 11.5–15.5)
WBC: 5.1 10*3/uL (ref 4.0–10.5)

## 2015-01-06 LAB — TROPONIN I
Troponin I: <0.03 ng/mL (ref <0.031)
Troponin I: <0.03 ng/mL (ref <0.031)
Troponin I: <0.03 ng/mL (ref <0.031)

## 2015-01-06 LAB — COMPREHENSIVE METABOLIC PANEL
ALK PHOS: 74 U/L (ref 39–117)
ALT: 17 U/L (ref 0–53)
AST: 26 U/L (ref 0–37)
Albumin: 3.2 g/dL — ABNORMAL LOW (ref 3.5–5.2)
Anion gap: 4 — ABNORMAL LOW (ref 5–15)
BILIRUBIN TOTAL: 1 mg/dL (ref 0.3–1.2)
BUN: 12 mg/dL (ref 6–23)
CHLORIDE: 101 mmol/L (ref 96–112)
CO2: 30 mmol/L (ref 19–32)
Calcium: 8.5 mg/dL (ref 8.4–10.5)
Creatinine, Ser: 0.78 mg/dL (ref 0.50–1.35)
GFR calc Af Amer: >90 mL/min (ref >90)
GFR calc non Af Amer: 88 mL/min — ABNORMAL LOW (ref >90)
Glucose, Bld: 165 mg/dL — ABNORMAL HIGH (ref 70–99)
Potassium: 3.6 mmol/L (ref 3.5–5.1)
Sodium: 135 mmol/L (ref 135–145)
TOTAL PROTEIN: 6.7 g/dL (ref 6.0–8.3)

## 2015-01-06 LAB — LIPASE, BLOOD: Lipase: 12 U/L (ref 11–59)

## 2015-01-06 MED ORDER — PIPERACILLIN-TAZOBACTAM 3.375 G IVPB
3.3750 g | Freq: Three times a day (TID) | INTRAVENOUS | Status: DC
  Administered 2015-01-06 – 2015-01-08 (×6): 3.375 g via INTRAVENOUS
  Filled 2015-01-06 (×7): qty 50

## 2015-01-06 MED ORDER — ACETAMINOPHEN 500 MG PO TABS
1000.0000 mg | ORAL_TABLET | Freq: Three times a day (TID) | ORAL | Status: DC
  Administered 2015-01-06 – 2015-01-08 (×5): 1000 mg via ORAL
  Filled 2015-01-06 (×7): qty 2

## 2015-01-06 MED ORDER — HYDROMORPHONE HCL 1 MG/ML IJ SOLN
0.5000 mg | INTRAMUSCULAR | Status: DC | PRN
  Administered 2015-01-06: 0.5 mg via INTRAVENOUS
  Filled 2015-01-06: qty 1

## 2015-01-06 MED ORDER — PHENOL 1.4 % MT LIQD: 2.0000 | OROMUCOSAL | Status: DC | PRN

## 2015-01-06 MED ORDER — PROMETHAZINE HCL 25 MG/ML IJ SOLN: 6.2500 mg | INTRAMUSCULAR | Status: DC | PRN

## 2015-01-06 MED ORDER — IOHEXOL 300 MG/ML  SOLN
25.0000 mL | INTRAMUSCULAR | Status: AC
  Administered 2015-01-06 (×2): 25 mL via ORAL

## 2015-01-06 MED ORDER — IOHEXOL 300 MG/ML  SOLN
100.0000 mL | Freq: Once | INTRAMUSCULAR | Status: AC | PRN
  Administered 2015-01-06: 100 mL via INTRAVENOUS

## 2015-01-06 MED ORDER — MAGIC MOUTHWASH
15.0000 mL | Freq: Four times a day (QID) | ORAL | Status: DC | PRN
  Filled 2015-01-06: qty 15

## 2015-01-06 MED ORDER — LIP MEDEX EX OINT
1.0000 "application " | TOPICAL_OINTMENT | Freq: Two times a day (BID) | CUTANEOUS | Status: DC
  Administered 2015-01-07 – 2015-01-08 (×2): 1 via TOPICAL
  Filled 2015-01-06 (×2): qty 7

## 2015-01-06 MED ORDER — GI COCKTAIL ~~LOC~~
30.0000 mL | Freq: Once | ORAL | Status: AC
  Administered 2015-01-06: 30 mL via ORAL
  Filled 2015-01-06: qty 30

## 2015-01-06 MED ORDER — LACTATED RINGERS IV SOLN
INTRAVENOUS | Status: DC
  Administered 2015-01-06: 17:00:00 via INTRAVENOUS

## 2015-01-06 MED ORDER — ALUM & MAG HYDROXIDE-SIMETH 200-200-20 MG/5ML PO SUSP: 30.0000 mL | Freq: Four times a day (QID) | ORAL | Status: DC | PRN

## 2015-01-06 MED ORDER — FLUTICASONE PROPIONATE 50 MCG/ACT NA SUSP
1.0000 | Freq: Every day | NASAL | Status: DC
  Administered 2015-01-06 – 2015-01-08 (×3): 1 via NASAL
  Filled 2015-01-06: qty 16

## 2015-01-06 MED ORDER — METOPROLOL TARTRATE 1 MG/ML IV SOLN
5.0000 mg | Freq: Four times a day (QID) | INTRAVENOUS | Status: DC
  Administered 2015-01-07 – 2015-01-08 (×6): 5 mg via INTRAVENOUS
  Filled 2015-01-06 (×12): qty 5

## 2015-01-06 MED ORDER — MENTHOL 3 MG MT LOZG: 1.0000 | LOZENGE | OROMUCOSAL | Status: DC | PRN

## 2015-01-06 MED ORDER — LEVOFLOXACIN 500 MG PO TABS
500.0000 mg | ORAL_TABLET | Freq: Every day | ORAL | Status: DC
  Administered 2015-01-06: 500 mg via ORAL
  Filled 2015-01-06: qty 1

## 2015-01-06 MED ORDER — HYDROMORPHONE HCL 1 MG/ML IJ SOLN
0.5000 mg | INTRAMUSCULAR | Status: DC | PRN
  Administered 2015-01-06 – 2015-01-08 (×4): 1 mg via INTRAVENOUS
  Filled 2015-01-06 (×4): qty 1

## 2015-01-06 MED ORDER — HYDROMORPHONE HCL 1 MG/ML IJ SOLN
1.0000 mg | Freq: Once | INTRAMUSCULAR | Status: AC
  Administered 2015-01-06: 1 mg via INTRAVENOUS
  Filled 2015-01-06: qty 1

## 2015-01-06 MED ORDER — OXYCODONE HCL 5 MG PO TABS
5.0000 mg | ORAL_TABLET | Freq: Four times a day (QID) | ORAL | Status: DC | PRN
  Administered 2015-01-08 (×2): 10 mg via ORAL
  Filled 2015-01-06 (×2): qty 2

## 2015-01-06 NOTE — Progress Notes (Signed)
ANTIBIOTIC CONSULT NOTE - Initial  Pharmacy Consult for Zosyn Indication: Intra-abdominal infection   Allergies  Allergen Reactions  . Ketorolac Tromethamine Anaphylaxis    REACTION TO Tordal  . Codeine Itching  . Morphine And Related     Broke him out in a rash on abdomen  . Tramadol Rash  . Tussionex Pennkinetic Er [Hydrocod Polst-Cpm Polst Er] Rash   Patient Measurements: Height: 6\' 2"  (188 cm) Weight: 228 lb (103.42 kg) IBW/kg (Calculated) : 82.2  Vital Signs: Temp: 99 F (37.2 C) (03/08 1407) Temp Source: Oral (03/08 1407) BP: 141/71 mmHg (03/08 1407) Pulse Rate: 73 (03/08 1407) Intake/Output from previous day: 03/07 0701 - 03/08 0700 In: 970 [P.O.:720; IV Piggyback:250] Out: 800 [Urine:800] Intake/Output from this shift:    Labs:  Recent Labs  01/04/15 0520 01/05/15 0540 01/06/15 0306  WBC 1.7* 2.6* 5.1  HGB 9.5* 9.6* 9.8*  PLT 140* 100* 89*  CREATININE 0.85 0.83 0.78   Estimated Creatinine Clearance: 107.1 mL/min (by C-G formula based on Cr of 0.78).  Recent Labs  01/04/15 1505  [place] 10.4     Microbiology: Recent Results (from the past 720 hour(s))  Blood culture (routine x 2)     Status: None (Preliminary result)   Collection Time: 01/02/15  3:19 PM  Result Value Ref Range Status   Specimen Description BLOOD LEFT ARM  Final   Special Requests   Final    BOTTLES DRAWN AEROBIC AND ANAEROBIC 5CC BOTH BOTTLES   Culture   Final           BLOOD CULTURE RECEIVED NO GROWTH TO DATE CULTURE WILL BE HELD FOR 5 DAYS BEFORE ISSUING A FINAL NEGATIVE REPORT Performed at [facility]    Report Status PENDING  Incomplete  Blood culture (routine x 2)     Status: None (Preliminary result)   Collection Time: 01/02/15  3:20 PM  Result Value Ref Range Status   Specimen Description BLOOD LEFT HAND  Final   Special Requests BOTTLES DRAWN AEROBIC ONLY 2CC  Final   Culture   Final           BLOOD CULTURE RECEIVED NO GROWTH TO DATE CULTURE WILL BE  HELD FOR 5 DAYS BEFORE ISSUING A FINAL NEGATIVE REPORT Performed at [facility]    Report Status PENDING  Incomplete  Culture, Urine     Status: None   Collection Time: 01/04/15  6:03 PM  Result Value Ref Range Status   Specimen Description URINE, CLEAN CATCH  Final   Special Requests NONE  Final   Colony Count NO GROWTH Performed at [facility]   Final   Culture NO GROWTH Performed at [facility]   Final   Report Status 01/05/2015 FINAL  Final    Medical History: Past Medical History  Diagnosis Date  . Allergic rhinitis   . BPH (benign prostatic hyperplasia)   . GERD (gastroesophageal reflux disease)   . COPD (chronic obstructive pulmonary disease)   . Shortness of breath   . Arthritis   . Enlarged prostate   . Hypertension     dr Margreta Journey little in [place]   pcp      dr Stanford Breed  cardiac md  . Mental disorder   . Constipation   . Chronic chest pain   . Pneumonia     hx  . Cancer Lung  . Lung cancer 04/11/14    lul carina lesion=Adenocarcinoma   Assessment: Patient known to pharmacy from previous  dosing of Vancomycin and Cefepime 3/4 - 3/8  73 y/oF with PMH of NSCLC, last Alimta C4 was 2/29, presented with acute on chronic dyspnea, chills and cough. Pharmacy consulted to dose Cefepime & Vanc for HCAP.    3/4 >> Vancomycin >> 3/8 3/4 >> Cefepime >> 3/8 3/8 >> Levaquin >> 3/8 3/8 >> Zosyn >>  Tmax: AF WBCs: 5.1 Granix 353mcg given 3/6. Renal: SCr wnl, CrCl ~ 107CG  3/4 blood x 2: ngtd  3/8 Update:  CT scan shows acute cholecystitis.  Levaquin d/c'ed and pharmacy consulted to dose Zosyn IV for intra-abdominal infection  Goal of Therapy:  Appropriate antibiotic dosing for renal function and indication Eradication of infection  Plan:  Zosyn 3.375gm IV q8h (each dose infused over 4 hrs) Follow up renal fxn, culture results, and clinical course.  Leone Haven, PharmD  01/06/2015,5:27 PM.

## 2015-01-06 NOTE — Progress Notes (Signed)
Tyler [name] HAL:[id number] DOB: 01/14/1941 DOA: 01/02/2015 PCP: Tamsen Roers, MD  Brief narrative: 74 y/o ? 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 wob despite intermittent home oxygen that he uses 2 L-cough + white sputum, negative hemoptysis nausea and diarrhea-given Alimta 12/29/14 ANC dropped 540-Given Granix 300 MCG x 2/7 and counts ?  Started to have CP/N/ without vomit not relieved Gi cocktail 01/06/15   Past medical history-As per Problem list Chart reviewed as below- Reviewed  Consultants:  None  I have let his oncologist know he is here  Procedures:  None  Antibiotics:  Cefepime 1 g 3/4  Vancomycin 3/4   Subjective   N, no vomit Started last pm Also c/o nasal stuffiness No focal issue-No radiation of pain to Arm Feels like a "heaviness" not burning No diarrea Passed stool 3/7 Doesn't feel like eating   Objective    Interim History:   Telemetry: Sinus   Objective: Filed Vitals:   01/06/15 0233 01/06/15 0502 01/06/15 0812 01/06/15 0945  BP: 165/87 135/68 179/80 149/72  Pulse: 81 76 77 80  Temp:  98.6 F (37 C) 97 F (36.1 C) 97.8 F (36.6 C)  TempSrc: Oral Oral Oral Oral  Resp: 16 15 20 18   Height:      Weight:      SpO2: 98% 95% 97% 98%    Intake/Output Summary (Last 24 hours) at 01/06/15 1027 Last data filed at 01/05/15 1800  Gross per 24 hour  Intake    730 ml  Output    600 ml  Net    130 ml    Exam:  General: EOMI NCAT Cardiovascular: S1-S2 no murmur rub or gallop Respiratory: Increased crackles left lower lung fields Abdomen: Soft nontender nondistended no rebound Skin no lower extremity edema--mild excoriation RLE Neuro intact  Data Reviewed: Basic Metabolic Panel:  Recent Labs Lab  01/02/15 1104 01/03/15 0523 01/04/15 0520 01/05/15 0540 01/06/15 0306  NA 132* 137 134* 137 135  K 3.5 3.8 3.5 3.4* 3.6  CL 96 98 97 101 101  CO2 29 31 30 31 30   GLUCOSE 113* 132* 127* 117* 165*  BUN 18 15 13 11 12   CREATININE 0.91 0.83 0.85 0.83 0.78  CALCIUM 8.7 8.8 8.6 8.7 8.5   Liver Function Tests:  Recent Labs Lab 01/03/15 0523 01/04/15 0520 01/05/15 0540 01/06/15 0306  AST 21 22 23 26   ALT 19 18 16 17   ALKPHOS 62 65 63 74  BILITOT 0.9 0.8 0.8 1.0  PROT 6.3 6.2 6.2 6.7  ALBUMIN 2.9* 3.0* 3.0* 3.2*   No results for input(s): LIPASE, AMYLASE in the last 168 hours. No results for input(s): AMMONIA in the last 168 hours. CBC:  Recent Labs Lab 01/02/15 1104 01/03/15 0523 01/04/15 0520 01/05/15 0540 01/06/15 0306  WBC 5.1 2.6* 1.7* 2.6* 5.1  NEUTROABS  --   --  0.5* 1.1* 3.4  HGB 10.5* 9.9* 9.5* 9.6* 9.8*  HCT 31.2* 28.8* 27.7* 28.2* 29.1*  MCV 101.0* 100.0 99.3 98.6 97.7  PLT 174 139* 140* 100* 89*   Cardiac Enzymes:  Recent Labs Lab 01/06/15 0306  TROPONINI <0.03   BNP: Invalid input(s): POCBNP CBG: No results for input(s): GLUCAP in the last 168 hours.  Recent Results (from the past 240  hour(s))  Blood culture (routine x 2)     Status: None (Preliminary result)   Collection Time: 01/02/15  3:19 PM  Result Value Ref Range Status   Specimen Description BLOOD LEFT ARM  Final   Special Requests   Final    BOTTLES DRAWN AEROBIC AND ANAEROBIC 5CC BOTH BOTTLES   Culture   Final           BLOOD CULTURE RECEIVED NO GROWTH TO DATE CULTURE WILL BE HELD FOR 5 DAYS BEFORE ISSUING A FINAL NEGATIVE REPORT Performed at [facility]    Report Status PENDING  Incomplete  Blood culture (routine x 2)     Status: None (Preliminary result)   Collection Time: 01/02/15  3:20 PM  Result Value Ref Range Status   Specimen Description BLOOD LEFT HAND  Final   Special Requests BOTTLES DRAWN AEROBIC ONLY 2CC  Final   Culture   Final           BLOOD CULTURE  RECEIVED NO GROWTH TO DATE CULTURE WILL BE HELD FOR 5 DAYS BEFORE ISSUING A FINAL NEGATIVE REPORT Performed at [facility]    Report Status PENDING  Incomplete  Culture, Urine     Status: None   Collection Time: 01/04/15  6:03 PM  Result Value Ref Range Status   Specimen Description URINE, CLEAN CATCH  Final   Special Requests NONE  Final   Colony Count NO GROWTH Performed at [facility]   Final   Culture NO GROWTH Performed at [facility]   Final   Report Status 01/05/2015 FINAL  Final     Studies:              All Imaging reviewed and is as per above notation   Scheduled Meds: . enoxaparin (LOVENOX) injection  50 mg Subcutaneous Q24H  . escitalopram  20 mg Oral Daily  . fluticasone  1 spray Each Nare Daily  . latanoprost  1 drop Both Eyes QHS  . levofloxacin  500 mg Oral Daily  . nebivolol  10 mg Oral BID  . pantoprazole  40 mg Oral Daily  . tamsulosin  0.4 mg Oral q morning - 10a   Continuous Infusions:    Assessment/Plan:             1. Abd pain-Stat Lipase ordered-Granix associated in some cases with splenomegaly etc.etc.--Considering CT abd pelvis-d/c Dr. Earlie Server who agrees with CT abd, although cites his disease was mainly in the chest  2. Probable postobstructive pneumonia- cefepime 3 times a day 1 Vancomycin 750 every 12--->Levaquin 01/06/15.  repeat chest x-ray a.m. Essentially unchanged per my read.   BC x 2 neg 3. Elevated BNP-await echocardiogram-BNP can be elevated in states of infection EKG from admission does not show any LVH criteria 4. Chemotherapy-induced neutropenia -secondary to Alimta, ANC 500-->1100 3/7- Granix 300 g held after discussion Dr. Earlie Server 2/2 to #1 5. Metastatic NSCLC-hold chemotherapy while inpatient 6. Prior tobacco alcohol abuse-does not use currently. Monitor 7. Depression continue Lexapro 20 mg daily, flurazepam 30 mg daily at bedtime, Elavil 10 mg daily/daily at bedtime on hold as therapeutic duplication  with flurazepam daily at bedtime-this needs to be delineated as an outpatient 8. Moderate controlled hypertension-continue nebivolol 10 twice a day 9. BPH status post TUR-continue Flomax 0.4 every morning 10. Asymptomatic UA 2/2 to dysuria on 3/6 -UC pending 11. BMI 28   Code Status: Presumed full Family Communication: discussed c sister bedside Disposition Plan: Inpatient pending  resolution--Probable d/c if ANC ? in am and no F or Tmax 3/8  20 minutes  Verneita Griffes, MD  [facility] Pager [phone] 01/06/2015, 10:27 AM    LOS: 4 days

## 2015-01-06 NOTE — Progress Notes (Signed)
At this time patient is  sleeping soundly. Does not appear to be in any distress.

## 2015-01-06 NOTE — Consult Note (Addendum)
[facility]  [address] [place], [place] [address] Phone: [phone] FAX: [place]  1940/11/23 [id number]  CARE TEAM:  PCP: Tamsen Roers, MD  Outpatient Care Team: Patient Care Team: Tamsen Roers, MD as PCP - General (Family Medicine) Curt Bears, MD as Consulting Physician (Oncology)  Inpatient Treatment Team: Treatment Team: Attending Provider: Nita Sells, MD; Technician: Dayton Scrape, NT; Registered Nurse: Delena Bali, RN; Rounding Team: Ian Bushman, MD; Technician: Wilder Glade, NT; Licensed Practical Nurse: Marcina Millard, LPN; Registered Nurse: Martyn Malay, RN; Consulting Physician: Nolon Nations, MD  This patient is a 74 y.o.male who presents today for surgical evaluation at the request of Dr Verlon Au.   Reason for evaluation: Acute cholecystitis  Pleasant elderly gentleman with numerous health issues.  COPD.  Lung cancer on chemotherapy.  Reflux disease.  Poorly controlled hypertension.  Had dyspnea and hypoxia.  Admitted for pneumonia.  Patient developed nausea and abdominal pain.  Has become more intense in the right upper quadrant.  Abdominal exam concerning.  CT scan performed concerning for thickened gallbladder for possible cholecystitis.  Surgical consultation recommended.  Patient normally eats anything he wants.  Does have some mild heartburn and reflux but usually controlled with medicines.  No severe constipation or diarrhea although some constipation in the past in the chart at least.  Patient denies this much now.  He has had inguinal hernia surgery but no other major abdominal surgery.  Has had nausea but has not thrown up.  No improvement with GI cocktail.  Can walk a little bit but does get short of breath after walking about 10 minutes.  No severe exertional chest pain.  Past Medical History  Diagnosis Date  . Allergic rhinitis   . BPH (benign prostatic hyperplasia)    . GERD (gastroesophageal reflux disease)   . COPD (chronic obstructive pulmonary disease)   . Shortness of breath   . Arthritis   . Enlarged prostate   . Hypertension     dr Margreta Journey little in [place]   pcp      dr Stanford Breed  cardiac md  . Mental disorder   . Constipation   . Chronic chest pain   . Pneumonia     hx  . Cancer Lung  . 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 Ankle surgery  2005  . Prostate surgery  11/25/2010  . Hernia repair    . Mediastinoscopy  06/11/2012    Procedure: MEDIASTINOSCOPY;  Surgeon: Grace Isaac, MD;  Location: [facility];  Service: Thoracic;  Laterality: N/A;  . Inguinal hernia repair      left  . Eye surgery      cataract right  . Video bronchoscopy with endobronchial ultrasound  10/08/2012    Procedure: VIDEO BRONCHOSCOPY WITH ENDOBRONCHIAL ULTRASOUND;  Surgeon: Collene Gobble, MD;  Location: [facility];  Service: Pulmonary;  Laterality: N/A;  . Video bronchoscopy Bilateral 04/11/2014    Procedure: VIDEO BRONCHOSCOPY WITHOUT FLUORO;  Surgeon: Collene Gobble, MD;  Location: [facility];  Service: Cardiopulmonary;  Laterality: Bilateral;    History   Social History  . Marital Status: Single    Spouse Name: N/A  . Number of Children: 2  . Years of Education: N/A   Occupational History  . RETIRED     worked at a service station  Social History Main Topics  . Smoking status: Former Smoker -- 2.00 packs/day for 50 years    Types: Cigarettes    Quit date: 12/26/2009  . Smokeless tobacco: Never Used  . Alcohol Use: 14.4 oz/week    24 Cans of beer per week     Comment: 4 beers every night  . Drug Use: No  . Sexual Activity: No   Other Topics Concern  . Not on file   Social History Narrative    Family History  Problem Relation Age of Onset  . Heart disease Mother   . Arthritis Sister   . Arthritis Sister   . Liver cancer Father     Current Facility-Administered  Medications  Medication Dose Route Frequency Provider Last Rate Last Dose  . acetaminophen (TYLENOL) tablet 650 mg  650 mg Oral Q6H PRN Orson Eva, MD       Or  . acetaminophen (TYLENOL) suppository 650 mg  650 mg Rectal Q6H PRN Orson Eva, MD      . albuterol (PROVENTIL) (2.5 MG/3ML) 0.083% nebulizer solution 2.5 mg  2.5 mg Nebulization Q6H PRN Orson Eva, MD      . fluticasone (FLONASE) 50 MCG/ACT nasal spray 1 spray  1 spray Each Nare Daily Nita Sells, MD   1 spray at 01/06/15 1143  . HYDROmorphone (DILAUDID) injection 0.5 mg  0.5 mg Intravenous Q2H PRN Nita Sells, MD   0.5 mg at 01/06/15 1726  . lactated ringers infusion   Intravenous Continuous Nita Sells, MD 75 mL/hr at 01/06/15 1727    . latanoprost (XALATAN) 0.005 % ophthalmic solution 1 drop  1 drop Both Eyes QHS Orson Eva, MD   1 drop at 01/05/15 2152  . metoprolol (LOPRESSOR) injection 5 mg  5 mg Intravenous 4 times per day Nita Sells, MD   5 mg at 01/06/15 1727  . ondansetron (ZOFRAN) injection 4 mg  4 mg Intravenous Q6H PRN Orson Eva, MD   4 mg at 01/06/15 1727  . tamsulosin (FLOMAX) capsule 0.4 mg  0.4 mg Oral q morning - 10a Orson Eva, MD   0.4 mg at 01/06/15 1026     Allergies  Allergen Reactions  . Ketorolac Tromethamine Anaphylaxis    REACTION TO Tordal  . Codeine Itching  . Morphine And Related     Broke him out in a rash on abdomen  . Tramadol Rash  . Tussionex Pennkinetic Er [Hydrocod Polst-Cpm Polst Er] Rash    ROS: Constitutional:  No fevers, chills, sweats.  Weight stable Eyes:  No vision changes, No discharge HENT:  No sore throats, nasal drainage Lymph: No neck swelling, No bruising easily Pulmonary:  No cough, ? + productive sputum CV: No orthopnea, PND  Patient walks 10 minutes slowly without difficulty.  No exertional chest/neck/shoulder/arm pain. GI: No personal nor family history of GI/colon cancer, inflammatory bowel disease, irritable bowel syndrome, allergy such as  Celiac Sprue, dietary/dairy problems, colitis, ulcers.  No recent sick contacts/gastroenteritis.  No travel outside the country.  No changes in diet. Renal: No UTIs, No hematuria Genital:  No drainage, bleeding, masses Musculoskeletal: No severe joint pain.  Motor joint pain that narcotics and naproxen help control.  Good ROM major joints Skin:  No sores or lesions.  No rashes Heme/Lymph:  No easy bleeding.  No swollen lymph nodes Neuro: No focal weakness/numbness.  No seizures Psych: No suicidal ideation.  No hallucinations  BP 141/71 mmHg  Pulse 73  Temp(Src) 99 F (37.2 C) (Oral)  Resp  18  Ht $R'6\' 2"'qg$  (1.88 m)  Wt 228 lb (103.42 kg)  BMI 29.26 kg/m2  SpO2 100%  Physical Exam: General: Pt awake/alert/oriented x4 in mild acute distress Eyes: PERRL, normal EOM. Sclera nonicteric Neuro: CN II-XII intact w/o focal sensory/motor deficits. Lymph: No head/neck/groin lymphadenopathy Psych:  No delerium/psychosis/paranoia.  Somewhat jumpy.  A little anxious but consolable. HENT: Normocephalic, Mucus membranes moist.  No thrush Neck: Supple, No tracheal deviation Chest: No pain.  OK respiratory excursion.  Decreased breath sounds at bases CV:  Pulses intact.  Regular rhythm Abdomen: Soft, Nondistended.  Sharp tenderness and right upper quadrant with Murphy sign.  Rest of abdomen is nontender.  No incarcerated hernias. Ext:  SCDs BLE.  No significant edema.  No cyanosis Skin: No petechiae / purpurea.  No major sores Musculoskeletal: No severe joint pain.  Good ROM major joints   Results:   Labs: Results for orders placed or performed during the hospital encounter of 01/02/15 (from the past 48 hour(s))  Culture, Urine     Status: None   Collection Time: 01/04/15  6:03 PM  Result Value Ref Range   Specimen Description URINE, CLEAN CATCH    Special Requests NONE    Colony Count NO GROWTH Performed at [facility]     Culture NO GROWTH Performed at [facility]      Report Status 01/05/2015 FINAL   Urinalysis, Routine w reflex microscopic     Status: None   Collection Time: 01/04/15  6:03 PM  Result Value Ref Range   Color, Urine YELLOW YELLOW   APPearance CLEAR CLEAR   Specific Gravity, Urine 1.013 1.005 - 1.030   pH 5.5 5.0 - 8.0   Glucose, UA NEGATIVE NEGATIVE mg/dL   Hgb urine dipstick NEGATIVE NEGATIVE   Bilirubin Urine NEGATIVE NEGATIVE   Ketones, ur NEGATIVE NEGATIVE mg/dL   Protein, ur NEGATIVE NEGATIVE mg/dL   Urobilinogen, UA 1.0 0.0 - 1.0 mg/dL   Nitrite NEGATIVE NEGATIVE   Leukocytes, UA NEGATIVE NEGATIVE    Comment: MICROSCOPIC NOT DONE ON URINES WITH NEGATIVE PROTEIN, BLOOD, LEUKOCYTES, NITRITE, OR GLUCOSE <1000 mg/dL.  Procalcitonin     Status: None   Collection Time: 01/05/15  5:40 AM  Result Value Ref Range   Procalcitonin <0.10 ng/mL    Comment:        Interpretation: PCT (Procalcitonin) <= 0.5 ng/mL: Systemic infection (sepsis) is not likely. Local bacterial infection is possible. (NOTE)         ICU PCT Algorithm               Non ICU PCT Algorithm    ----------------------------     ------------------------------         PCT < 0.25 ng/mL                 PCT < 0.1 ng/mL     Stopping of antibiotics            Stopping of antibiotics       strongly encouraged.               strongly encouraged.    ----------------------------     ------------------------------       PCT level decrease by               PCT < 0.25 ng/mL       >= 80% from peak PCT       OR PCT 0.25 - 0.5 ng/mL  Stopping of antibiotics                                             encouraged.     Stopping of antibiotics           encouraged.    ----------------------------     ------------------------------       PCT level decrease by              PCT >= 0.25 ng/mL       < 80% from peak PCT        AND PCT >= 0.5 ng/mL            Continuin g antibiotics                                              encouraged.       Continuing antibiotics             encouraged.    ----------------------------     ------------------------------     PCT level increase compared          PCT > 0.5 ng/mL         with peak PCT AND          PCT >= 0.5 ng/mL             Escalation of antibiotics                                          strongly encouraged.      Escalation of antibiotics        strongly encouraged.   CBC with Differential/Platelet     Status: Abnormal   Collection Time: 01/05/15  5:40 AM  Result Value Ref Range   WBC 2.6 (L) 4.0 - 10.5 K/uL   RBC 2.86 (L) 4.22 - 5.81 MIL/uL   Hemoglobin 9.6 (L) 13.0 - 17.0 g/dL   HCT 28.2 (L) 39.0 - 52.0 %   MCV 98.6 78.0 - 100.0 fL   MCH 33.6 26.0 - 34.0 pg   MCHC 34.0 30.0 - 36.0 g/dL   RDW 13.6 11.5 - 15.5 %   Platelets 100 (L) 150 - 400 K/uL    Comment: SPECIMEN CHECKED FOR CLOTS REPEATED TO VERIFY PLATELET COUNT CONFIRMED BY SMEAR DELTA CHECK NOTED    Neutrophils Relative % 40 (L) 43 - 77 %   Neutro Abs 1.1 (L) 1.7 - 7.7 K/uL   Lymphocytes Relative 42 12 - 46 %   Lymphs Abs 1.1 0.7 - 4.0 K/uL   Monocytes Relative 13 (H) 3 - 12 %   Monocytes Absolute 0.3 0.1 - 1.0 K/uL   Eosinophils Relative 5 0 - 5 %   Eosinophils Absolute 0.1 0.0 - 0.7 K/uL   Basophils Relative 0 0 - 1 %   Basophils Absolute 0.0 0.0 - 0.1 K/uL  Comprehensive metabolic panel     Status: Abnormal   Collection Time: 01/05/15  5:40 AM  Result Value Ref Range   Sodium 137 135 - 145 mmol/L   Potassium 3.4 (L) 3.5 - 5.1 mmol/L   Chloride 101 96 - 112 mmol/L  CO2 31 19 - 32 mmol/L   Glucose, Bld 117 (H) 70 - 99 mg/dL   BUN 11 6 - 23 mg/dL   Creatinine, Ser 0.83 0.50 - 1.35 mg/dL   Calcium 8.7 8.4 - 10.5 mg/dL   Total Protein 6.2 6.0 - 8.3 g/dL   Albumin 3.0 (L) 3.5 - 5.2 g/dL   AST 23 0 - 37 U/L   ALT 16 0 - 53 U/L   Alkaline Phosphatase 63 39 - 117 U/L   Total Bilirubin 0.8 0.3 - 1.2 mg/dL   GFR calc non Af Amer 86 (L) >90 mL/min   GFR calc Af Amer >90 >90 mL/min    Comment: (NOTE) The eGFR has been calculated  using the CKD EPI equation. This calculation has not been validated in all clinical situations. eGFR's persistently <90 mL/min signify possible Chronic Kidney Disease.    Anion gap 5 5 - 15  Comprehensive metabolic panel     Status: Abnormal   Collection Time: 01/06/15  3:06 AM  Result Value Ref Range   Sodium 135 135 - 145 mmol/L   Potassium 3.6 3.5 - 5.1 mmol/L   Chloride 101 96 - 112 mmol/L   CO2 30 19 - 32 mmol/L   Glucose, Bld 165 (H) 70 - 99 mg/dL   BUN 12 6 - 23 mg/dL   Creatinine, Ser 0.78 0.50 - 1.35 mg/dL   Calcium 8.5 8.4 - 10.5 mg/dL   Total Protein 6.7 6.0 - 8.3 g/dL   Albumin 3.2 (L) 3.5 - 5.2 g/dL   AST 26 0 - 37 U/L   ALT 17 0 - 53 U/L   Alkaline Phosphatase 74 39 - 117 U/L   Total Bilirubin 1.0 0.3 - 1.2 mg/dL   GFR calc non Af Amer 88 (L) >90 mL/min   GFR calc Af Amer >90 >90 mL/min    Comment: (NOTE) The eGFR has been calculated using the CKD EPI equation. This calculation has not been validated in all clinical situations. eGFR's persistently <90 mL/min signify possible Chronic Kidney Disease.    Anion gap 4 (L) 5 - 15  CBC with Differential/Platelet     Status: Abnormal   Collection Time: 01/06/15  3:06 AM  Result Value Ref Range   WBC 5.1 4.0 - 10.5 K/uL   RBC 2.98 (L) 4.22 - 5.81 MIL/uL   Hemoglobin 9.8 (L) 13.0 - 17.0 g/dL   HCT 29.1 (L) 39.0 - 52.0 %   MCV 97.7 78.0 - 100.0 fL   MCH 32.9 26.0 - 34.0 pg   MCHC 33.7 30.0 - 36.0 g/dL   RDW 13.6 11.5 - 15.5 %   Platelets 89 (L) 150 - 400 K/uL    Comment: REPEATED TO VERIFY SPECIMEN CHECKED FOR CLOTS PLATELET COUNT CONFIRMED BY SMEAR    Neutrophils Relative % 68 43 - 77 %   Lymphocytes Relative 16 12 - 46 %   Monocytes Relative 13 (H) 3 - 12 %   Eosinophils Relative 2 0 - 5 %   Basophils Relative 1 0 - 1 %   Neutro Abs 3.4 1.7 - 7.7 K/uL   Lymphs Abs 0.8 0.7 - 4.0 K/uL   Monocytes Absolute 0.7 0.1 - 1.0 K/uL   Eosinophils Absolute 0.1 0.0 - 0.7 K/uL   Basophils Absolute 0.1 0.0 - 0.1 K/uL    RBC Morphology RARE NRBCs    WBC Morphology WHITE COUNT CONFIRMED ON SMEAR     Comment: MILD LEFT SHIFT (1-5% METAS, OCC MYELO,  OCC BANDS)  Troponin I (q 6hr x 3)     Status: None   Collection Time: 01/06/15  3:06 AM  Result Value Ref Range   Troponin I <0.03 <0.031 ng/mL    Comment:        NO INDICATION OF MYOCARDIAL INJURY.   Troponin I (q 6hr x 3)     Status: None   Collection Time: 01/06/15  8:17 AM  Result Value Ref Range   Troponin I <0.03 <0.031 ng/mL    Comment:        NO INDICATION OF MYOCARDIAL INJURY.   Lipase, blood     Status: None   Collection Time: 01/06/15  9:30 AM  Result Value Ref Range   Lipase 12 11 - 59 U/L  Troponin I (q 6hr x 3)     Status: None   Collection Time: 01/06/15  2:25 PM  Result Value Ref Range   Troponin I <0.03 <0.031 ng/mL    Comment:        NO INDICATION OF MYOCARDIAL INJURY.     Imaging / Studies: Dg Chest 2 View  01/06/2015   CLINICAL DATA:  Patient being treated for pneumonia. History of hypertension.  EXAM: CHEST  2 VIEW  COMPARISON:  Chest radiograph 01/02/2015.  FINDINGS: Multiple monitoring leads overlie the patient. Stable cardiomegaly. Interval increase in left lower lobe heterogeneous opacities likely secondary to known left lower lobe pulmonary mass as well as adjacent pulmonary consolidation and left pleural effusion. Minimal right basilar atelectasis. Degenerative changes mid thoracic spine. Large hiatal hernia.  IMPRESSION: Increased heterogeneous opacities left lung base likely secondary to known left lower lobe pulmonary mass, associated peripheral heterogeneous opacities which may represent postobstructive atelectasis or pneumonia. Additionally there is a small left pleural effusion.   Electronically Signed   By: Lovey Newcomer M.D.   On: 01/06/2015 09:49   Dg Chest 2 View  01/02/2015   CLINICAL DATA:  Shortness of breath.  EXAM: CHEST  2 VIEW  COMPARISON:  October 19, 2014.  FINDINGS: Stable cardiomediastinal silhouette. No  pneumothorax or significant pleural effusion is noted. Right lung is clear. Mild left basilar opacity is noted most consistent with subsegmental atelectasis. Bony thorax is intact.  IMPRESSION: Mild left basilar subsegmental atelectasis.   Electronically Signed   By: Marijo Conception, M.D.   On: 01/02/2015 12:51   Ct Chest W Contrast  12/26/2014   CLINICAL DATA:  Stage III non-small-cell lung cancer. Chemotherapy in progress. Chest pain and shortness of breath.  EXAM: CT CHEST WITH CONTRAST  TECHNIQUE: Multidetector CT imaging of the chest was performed during intravenous contrast administration.  CONTRAST:  29mL OMNIPAQUE IOHEXOL 300 MG/ML  SOLN  COMPARISON:  CTA chest 10/19/14.  FINDINGS: No appreciable change in the spiculated LEFT lower lobe mass as measured on comparable image 29 series 3 to the previous image 49 series 7 radiology different. Moderate LEFT pleural effusion, not significantly increased. Moderate fibrotic change along the LEFT hemithorax, also stable. Mild peribronchial thickening LEFT lower lobe not significantly worse.  No significant enlargement of the previously identified hilar and mediastinal nodes, up to 10 mm short axis LEFT paratracheal AP window.  Normal heart size. No pericardial effusion. Coronary artery calcification is present. Moderately large hiatal hernia projects into the retrocardiac region, stable. Moderate atheromatous change of the transverse descending aorta, without progression.  Unremarkable appearing upper abdominal structures.  Mild emphysematous change in the lung bases particularly is stable. No pulmonary nodules.  IMPRESSION: Stable LEFT  lower lobe mass corresponding to the known lung cancer. No progression of disease.   Electronically Signed   By: Rolla Flatten M.D.   On: 12/26/2014 09:25   Ct Angio Chest Pe W/cm &/or Wo Cm  01/02/2015   CLINICAL DATA:  Short of breath. Onset of symptoms 3-4 days after last chemotherapy. LEFT lower lobe cancer. Chronic back pain.   EXAM: CT ANGIOGRAPHY CHEST WITH CONTRAST  TECHNIQUE: Multidetector CT imaging of the chest was performed using the standard protocol during bolus administration of intravenous contrast. Multiplanar CT image reconstructions and MIPs were obtained to evaluate the vascular anatomy.  CONTRAST:  191mL OMNIPAQUE IOHEXOL 350 MG/ML SOLN  COMPARISON:  10/19/2014.  12/26/2014.  FINDINGS: Bones: No aggressive osseous lesions. Lower cervical spondylosis. No thoracic spine compression fractures.  Cardiovascular: No acute aortic abnormality. Aortic arch atherosclerosis. Coronary artery atherosclerosis is present. If office based assessment of coronary risk factors has not been performed, it is now recommended.  No pulmonary embolus is present. There is extrinsic mass effect on the LEFT lower lobe pulmonary arteries associated with LEFT hilar and LEFT lower lobe mass. This nearly occludes the LEFT lower lobe pulmonary artery. No convincing evidence of acute pulmonary embolus or bland thrombus. The narrowing of the vessel appears extrinsic rather than from filling defects.  Comparing the heart size to the recent prior exam, the heart appears more dilated on today's study with LEFT atrial enlargement.  Lungs:  LEFT lower lobe mass is present and has enlarged slightly compared to the prior exam. When measured on soft tissue windows, this measures 36 mm x 26 mm at a similar location to the prior exam. Soft tissue density extends into the LEFT hilum.  Distal to the mass, there is increasing collapse/consolidation of the superior segment of the LEFT lower lobe that is most compatible with postobstructive pneumonia.  Severe emphysema. There is multifocal multilobar patchy airspace opacity. Notably, this is new compared to the recent chest CT. Interlobular septal thickening is present at the apices. The differential considerations are pulmonary edema versus infection. Aspiration is considered less likely. This is more prominent in the  lingula and LEFT lower lobe.  Increasing collapse/consolidation in the basal LEFT lower lobe, some of which is probably due to lower volumes and effusion.  Central airways: Trachea patent. Extrinsic mass effect on the LEFT lung bronchi.  Effusions: Small LEFT-greater-than-RIGHT bilateral dependently layering pleural effusions. Small amount of posterior pericardial fluid or thickening is increased compared to prior.  Lymphadenopathy: No axillary adenopathy. Small prevascular nodes. Low RIGHT peritracheal old borderline adenopathy. This appears larger than prior. LEFT hilar mass and adenopathy associated with LEFT lower lobe tumor again noted.  Esophagus: Moderate hiatal hernia.  Esophagus appears normal.  Upper abdomen: Adrenal glands within normal limits. Old granulomatous disease in the liver.  Other: None.  Review of the MIP images confirms the above findings.  IMPRESSION: 1. Negative for pulmonary embolus. LEFT lower lobe superior segment pulmonary mass extending to the LEFT hilum today measuring 36 mm x 26 mm, larger than on the most recent comparison. Extrinsic compression of the LEFT lower lobe airways and pulmonary vessels. 2. Constellation of findings compatible with mild CHF. Interstitial and mild alveolar or pulmonary edema is favored over multifocal bronchopneumonia. 3. Underlying emphysema and coronary artery disease. 4. Increasing consolidation of the LEFT lower lobe distal to the mass, suggesting postobstructive pneumonia.   Electronically Signed   By: Dereck Ligas M.D.   On: 01/02/2015 14:38   Ct Abdomen W  Contrast  01/06/2015   CLINICAL DATA:  Epigastric pain and nausea for 3 days.  EXAM: CT ABDOMEN WITH CONTRAST  TECHNIQUE: Multidetector CT imaging of the abdomen was performed using the standard protocol following bolus administration of intravenous contrast.  CONTRAST:  13mL OMNIPAQUE IOHEXOL 300 MG/ML  SOLN  COMPARISON:  PET-CT scan 04/24/2014  FINDINGS: Lower chest: There are new bilateral  small effusions left great than right. Mild atelectasis at the left lung base. No pericardial fluid. Large hiatal hernia noted posterior to right atrium.  Hepatobiliary: No focal hepatic lesion.  No biliary duct dilatation.  The gallbladder is distended to 49 mm. The gallbladder wall is mildly thickened. There is small amount of pericholecystic fluid. There is inflammatory stranding within the fat adjacent to the gallbladder (image 36 of series 10). There is inflation inflammation in the porta hepatis. The common bile duct appears normal caliber. There are no radiodense gallstones evident.  Pancreas: The pancreas is atrophic. There is no pancreatic duct dilatation or significant inflammation.  Spleen: Normal spleen.  Normal splenic vein.  Adrenals/urinary tract: Adrenal glands and kidneys are normal.  Stomach/Bowel: There is mild inflammation along the second portion duodenum. No bowel obstruction. Limited view of the small bowel and colon are unremarkable. The entirety of the bowel is not imaged.  Vascular/Lymphatic: Abdominal aorta is normal caliber without focal calcifications. No retroperitoneal periportal lymphadenopathy.  Musculoskeletal: No aggressive osseous lesion.  Other:  IMPRESSION: 1. Distended gallbladder with gallbladder wall thickening and pericholecystic fluid is most consistent with acute cholecystitis. 2. No biliary duct dilatation. 3. Pancreas appears normal. 4. Bilateral small effusions. 5. Large hiatal hernia. Findings conveyed toFloor nurse Davidon 01/06/2015  at17:06.   Electronically Signed   By: Suzy Bouchard M.D.   On: 01/06/2015 17:06    Medications / Allergies: per chart  Antibiotics: Anti-infectives    Start     Dose/Rate Route Frequency Ordered Stop   01/06/15 1000  levofloxacin (LEVAQUIN) tablet 500 mg  Status:  Discontinued     500 mg Oral Daily 01/06/15 0907 01/06/15 1715   01/04/15 2359  vancomycin (VANCOCIN) IVPB 1000 mg/200 mL premix  Status:  Discontinued     1,000  mg 200 mL/hr over 60 Minutes Intravenous Every 12 hours 01/04/15 1659 01/06/15 0907   01/03/15 0400  vancomycin (VANCOCIN) IVPB 750 mg/150 ml premix  Status:  Discontinued     750 mg 150 mL/hr over 60 Minutes Intravenous Every 12 hours 01/02/15 1738 01/04/15 1659   01/02/15 2200  ceFEPIme (MAXIPIME) 1 g in dextrose 5 % 50 mL IVPB  Status:  Discontinued     1 g 100 mL/hr over 30 Minutes Intravenous 3 times per day 01/02/15 1457 01/06/15 0907   01/02/15 1500  vancomycin (VANCOCIN) 2,000 mg in sodium chloride 0.9 % 500 mL IVPB     2,000 mg 250 mL/hr over 120 Minutes Intravenous  Once 01/02/15 1453 01/02/15 1744   01/02/15 1500  ceFEPIme (MAXIPIME) 2 g in dextrose 5 % 50 mL IVPB     2 g 100 mL/hr over 30 Minutes Intravenous  Once 01/02/15 1457 01/02/15 1655     Patient Active Problem List   Diagnosis Date Noted  . Cholecystitis, acute 01/06/2015  . Lobar pneumonia due to unspecified organism 01/02/2015  . Adenocarcinoma of lung, stage 3 01/02/2015  . Benign essential HTN 01/02/2015  . HCAP (healthcare-associated pneumonia)   . Malignant neoplasm of hilus of left lung 09/29/2014  . Neoplasm related pain 08/26/2014  . Constipation  08/26/2014  . Nausea without vomiting 08/26/2014  . Anorexia 08/26/2014  . Weight loss 08/26/2014  . Neoplastic malignant related fatigue 08/26/2014  . Other pancytopenia 08/26/2014  . Lung cancer 04/23/2014  . Chronic respiratory failure 11/09/2013  . Dyspnea 11/08/2013  . Acute delirium 10/12/2012  . Chest pain 05/23/2012  . Hypertension 05/23/2012  . COPD (chronic obstructive pulmonary disease) 02/09/2012   Active Problems:   COPD (chronic obstructive pulmonary disease)   Lobar pneumonia due to unspecified organism   Adenocarcinoma of lung, stage 3   Benign essential HTN   HCAP (healthcare-associated pneumonia)   Cholecystitis, acute    Assessment  Acute cholecystitis.  Plan   Switch to more intense IV antibiotics.  Recommend switched to  Zosyn given immunosuppressed and deconditioned state.  Standard of care would be laparoscopic cholecystectomy.  However in his deconditioned state, operative risks are increased.  Would palliate the situation with percutaneous cholecystostomy tube drainage and IV antibiotics.  Once the cholecystitis acute phase has resolved, can consider elective cholecystectomy in 6 weeks.  Could consider cholangiography through the percutaneous cholecystostomy tube.  If cystic duct pain and no stones, could be acalculus cholecystitis.  Remove drain and avoid cholecystectomy.  If persistent cystic duct obstruction or diagnosis of any gallstones, more strongly consider elective cholecystectomy.  Either way try and hold off on surgery during this admission with pneumonia, hypertension, thrombocytopenia, immunosuppressed state on chemotherapy.  Patient would like to avoid surgery now but is open to the idea later.  The anatomy & physiology of hepatobiliary & pancreatic function was discussed.  The pathophysiology of gallbladder dysfunction was discussed.  Natural history risks without surgery was discussed.   I feel the risks of no intervention will lead to serious problems that outweigh the operative risks; therefore, I recommended cholecystectomy to remove the pathology.  I explained laparoscopic techniques with possible need for an open approach.  Probable cholangiogram to evaluate the bilary tract was explained as well.    Risks such as bleeding, infection, abscess, leak, injury to other organs, need for further treatment, stroke, heart attack, death, and other risks were discussed.  I noted a good likelihood this will help address the problem.  Possibility that this will not correct all abdominal symptoms was explained.  Goals of post-operative recovery were discussed as well.  We will work to minimize complications.  An educational handout further explaining the pathology and treatment options was given as well.  Questions  were answered.  The patient expresses understanding & wishes to proceed with surgery later if possible.    Continue proton pump inhibitor for reflux disease.  Pneumonia, hypertension, other health issues per primary service.    We will follow.  [name], M.D., F.A.C.S. Gastrointestinal and Minimally Invasive Surgery [facility] [address] [place], [place] [address] [phone] Main / Paging

## 2015-01-06 NOTE — Progress Notes (Signed)
Shift event:   Notified by RN regarding pt c/o CP. 12-Lead EKG obtained. At bedside pt noted resting quietly w/ eyes closed. He awakens easily and is oriented x 3. He reports persistent SSCP CP since dinner tonight (approx 6p) that has been 8/10 though currently 5/10. It is non-radiating and described at dull ache. Denies similar CP in the past. He reports he has had SOB but is not objectively SOB. Pt has also c/o nausea. Unable to report if anything makes pain better or worse. Does admit to some TTP over (L) sternal region. There is no diaphoresis, and he denies dizziness. He does endorse nausea. VSS and pt is afebrile. EKG reveals NSR w/o ectopy and w/o any acute changes.  Assessment/Plan: 1. Chest pain: EKG w/o acute changes. Given history and exam could have a musculoskeletal vs GI component. Does not sound pleuritic in nature. ACS considered though atypical. Will cycle CE's. Give GI cocktail and IV dil for pain as PO Oxy IR did not help pain. Continue to monitor closely on telemetry.  Jeryl Columbia, NP-C [facility] Pager [phone]

## 2015-01-06 NOTE — Significant Event (Signed)
   Addendum    CT scan shows acute cholecystitis with gallbladder measuring 49 mm and pericholecystic fluid.  I discussed case with Dr. gross and our mutual opinion this patient is in no shape at this present time for laparoscopic cholecystectomy  Patient will probably need a percutaneous biliary drain and reevaluation as an outpatient by general surgeon  I have discontinued most of his oral medications, kept him nothing by mouth and start him on LR  75 mL per hour  We appreciate general surgery and IR input in advance

## 2015-01-07 ENCOUNTER — Inpatient Hospital Stay (HOSPITAL_COMMUNITY): Payer: Medicare Other

## 2015-01-07 DIAGNOSIS — K810 Acute cholecystitis: Secondary | ICD-10-CM

## 2015-01-07 LAB — PROCALCITONIN: Procalcitonin: 0.38 ng/mL

## 2015-01-07 LAB — CBC WITH DIFFERENTIAL/PLATELET
BASOS PCT: 0 % (ref 0–1)
Basophils Absolute: 0 10*3/uL (ref 0.0–0.1)
EOS PCT: 1 % (ref 0–5)
Eosinophils Absolute: 0.1 10*3/uL (ref 0.0–0.7)
HCT: 27.8 % — ABNORMAL LOW (ref 39.0–52.0)
HEMOGLOBIN: 9.6 g/dL — AB (ref 13.0–17.0)
LYMPHS ABS: 1.3 10*3/uL (ref 0.7–4.0)
Lymphocytes Relative: 13 % (ref 12–46)
MCH: 34.2 pg — ABNORMAL HIGH (ref 26.0–34.0)
MCHC: 34.5 g/dL (ref 30.0–36.0)
MCV: 98.9 fL (ref 78.0–100.0)
MONO ABS: 1.1 10*3/uL — AB (ref 0.1–1.0)
MONOS PCT: 11 % (ref 3–12)
Neutro Abs: 7.8 10*3/uL — ABNORMAL HIGH (ref 1.7–7.7)
Neutrophils Relative %: 75 % (ref 43–77)
PLATELETS: 85 10*3/uL — AB (ref 150–400)
RBC: 2.81 MIL/uL — AB (ref 4.22–5.81)
RDW: 14 % (ref 11.5–15.5)
WBC: 10.3 10*3/uL (ref 4.0–10.5)

## 2015-01-07 LAB — APTT: APTT: 33 s (ref 24–37)

## 2015-01-07 LAB — PROTIME-INR
INR: 1.22 (ref 0.00–1.49)
PROTHROMBIN TIME: 15.5 s — AB (ref 11.6–15.2)

## 2015-01-07 MED ORDER — MIDAZOLAM HCL 2 MG/2ML IJ SOLN
INTRAMUSCULAR | Status: AC | PRN
  Administered 2015-01-07 (×2): 0.5 mg via INTRAVENOUS
  Administered 2015-01-07: 1 mg via INTRAVENOUS

## 2015-01-07 MED ORDER — TEMAZEPAM 15 MG PO CAPS: 15.0000 mg | ORAL_CAPSULE | Freq: Every evening | ORAL | Status: DC | PRN

## 2015-01-07 MED ORDER — LIDOCAINE HCL 1 % IJ SOLN
INTRAMUSCULAR | Status: AC
  Filled 2015-01-07: qty 20

## 2015-01-07 MED ORDER — FENTANYL CITRATE 0.05 MG/ML IJ SOLN
INTRAMUSCULAR | Status: AC
  Filled 2015-01-07: qty 4

## 2015-01-07 MED ORDER — ESCITALOPRAM OXALATE 20 MG PO TABS
20.0000 mg | ORAL_TABLET | Freq: Every day | ORAL | Status: DC
  Administered 2015-01-07 – 2015-01-08 (×2): 20 mg via ORAL
  Filled 2015-01-07 (×2): qty 1

## 2015-01-07 MED ORDER — MIDAZOLAM HCL 2 MG/2ML IJ SOLN
INTRAMUSCULAR | Status: AC
  Filled 2015-01-07: qty 6

## 2015-01-07 MED ORDER — IOHEXOL 300 MG/ML  SOLN
10.0000 mL | Freq: Once | INTRAMUSCULAR | Status: AC | PRN
  Administered 2015-01-07: 10 mL

## 2015-01-07 MED ORDER — FENTANYL CITRATE 0.05 MG/ML IJ SOLN
INTRAMUSCULAR | Status: AC | PRN
  Administered 2015-01-07 (×2): 25 ug via INTRAVENOUS
  Administered 2015-01-07: 50 ug via INTRAVENOUS

## 2015-01-07 NOTE — Progress Notes (Signed)
OT Cancellation Note  Patient Details Name: Tyler [name] MRN: [id number] DOB: Sep 29, 1941   Cancelled Treatment:    Reason Eval/Treat Not Completed: Other (comment) Note pt with pain when PT attempted. Also for procedure/OR  today. Will check later time.  [place], [place] 01/07/2015, 10:44 AM

## 2015-01-07 NOTE — Progress Notes (Signed)
Patient ID: Tyler [name], male   DOB: 08-04-1941, 74 y.o.   MRN: [id number]    Subjective: Pt c/o abdominal pain this morning.   Objective: Vital signs in last 24 hours: Temp:  [97.8 F (36.6 C)-99 F (37.2 C)] 97.9 F (36.6 C) (03/09 0548) Pulse Rate:  [73-80] 74 (03/09 0548) Resp:  [18-20] 20 (03/09 0548) BP: (127-149)/(65-72) 127/69 mmHg (03/09 0548) SpO2:  [96 %-100 %] 97 % (03/09 0548) Last BM Date: 01/04/15  Intake/Output from previous day: 03/08 0701 - 03/09 0700 In: -  Out: 200 [Urine:200] Intake/Output this shift:    PE: Abd: soft, tender in RUQ, +BS, ND Heart: regular Lungs: CTAB, decreased at bases  Lab Results:   Recent Labs  01/06/15 0306 01/07/15 0510  WBC 5.1 10.3  HGB 9.8* 9.6*  HCT 29.1* 27.8*  PLT 89* 85*   BMET  Recent Labs  01/05/15 0540 01/06/15 0306  NA 137 135  K 3.4* 3.6  CL 101 101  CO2 31 30  GLUCOSE 117* 165*  BUN 11 12  CREATININE 0.83 0.78  CALCIUM 8.7 8.5   PT/INR  Recent Labs  01/07/15 0510  LABPROT 15.5*  INR 1.22   CMP     Component Value Date/Time   NA 135 01/06/2015 0306   NA 142 12/29/2014 0812   K 3.6 01/06/2015 0306   K 3.9 12/29/2014 0812   CL 101 01/06/2015 0306   CO2 30 01/06/2015 0306   CO2 27 12/29/2014 0812   GLUCOSE 165* 01/06/2015 0306   GLUCOSE 100 12/29/2014 0812   BUN 12 01/06/2015 0306   BUN 16.3 12/29/2014 0812   CREATININE 0.78 01/06/2015 0306   CREATININE 0.9 12/29/2014 0812   CALCIUM 8.5 01/06/2015 0306   CALCIUM 9.3 12/29/2014 0812   PROT 6.7 01/06/2015 0306   PROT 6.3* 12/29/2014 0812   ALBUMIN 3.2* 01/06/2015 0306   ALBUMIN 2.9* 12/29/2014 0812   AST 26 01/06/2015 0306   AST 19 12/29/2014 0812   ALT 17 01/06/2015 0306   ALT 18 12/29/2014 0812   ALKPHOS 74 01/06/2015 0306   ALKPHOS 94 12/29/2014 0812   BILITOT 1.0 01/06/2015 0306   BILITOT 0.32 12/29/2014 0812   GFRNONAA 88* 01/06/2015 0306   GFRAA >90 01/06/2015 0306   Lipase     Component Value Date/Time   LIPASE 12 01/06/2015 0930       Studies/Results: Dg Chest 2 View  01/06/2015   CLINICAL DATA:  Patient being treated for pneumonia. History of hypertension.  EXAM: CHEST  2 VIEW  COMPARISON:  Chest radiograph 01/02/2015.  FINDINGS: Multiple monitoring leads overlie the patient. Stable cardiomegaly. Interval increase in left lower lobe heterogeneous opacities likely secondary to known left lower lobe pulmonary mass as well as adjacent pulmonary consolidation and left pleural effusion. Minimal right basilar atelectasis. Degenerative changes mid thoracic spine. Large hiatal hernia.  IMPRESSION: Increased heterogeneous opacities left lung base likely secondary to known left lower lobe pulmonary mass, associated peripheral heterogeneous opacities which may represent postobstructive atelectasis or pneumonia. Additionally there is a small left pleural effusion.   Electronically Signed   By: Lovey Newcomer M.D.   On: 01/06/2015 09:49   Ct Abdomen W Contrast  01/06/2015   CLINICAL DATA:  Epigastric pain and nausea for 3 days.  EXAM: CT ABDOMEN WITH CONTRAST  TECHNIQUE: Multidetector CT imaging of the abdomen was performed using the standard protocol following bolus administration of intravenous contrast.  CONTRAST:  182mL OMNIPAQUE IOHEXOL 300 MG/ML  SOLN  COMPARISON:  PET-CT scan 04/24/2014  FINDINGS: Lower chest: There are new bilateral small effusions left great than right. Mild atelectasis at the left lung base. No pericardial fluid. Large hiatal hernia noted posterior to right atrium.  Hepatobiliary: No focal hepatic lesion.  No biliary duct dilatation.  The gallbladder is distended to 49 mm. The gallbladder wall is mildly thickened. There is small amount of pericholecystic fluid. There is inflammatory stranding within the fat adjacent to the gallbladder (image 36 of series 10). There is inflation inflammation in the porta hepatis. The common bile duct appears normal caliber. There are no radiodense gallstones  evident.  Pancreas: The pancreas is atrophic. There is no pancreatic duct dilatation or significant inflammation.  Spleen: Normal spleen.  Normal splenic vein.  Adrenals/urinary tract: Adrenal glands and kidneys are normal.  Stomach/Bowel: There is mild inflammation along the second portion duodenum. No bowel obstruction. Limited view of the small bowel and colon are unremarkable. The entirety of the bowel is not imaged.  Vascular/Lymphatic: Abdominal aorta is normal caliber without focal calcifications. No retroperitoneal periportal lymphadenopathy.  Musculoskeletal: No aggressive osseous lesion.  Other:  IMPRESSION: 1. Distended gallbladder with gallbladder wall thickening and pericholecystic fluid is most consistent with acute cholecystitis. 2. No biliary duct dilatation. 3. Pancreas appears normal. 4. Bilateral small effusions. 5. Large hiatal hernia. Findings conveyed toFloor nurse [place] 01/06/2015  at17:06.   Electronically Signed   By: Suzy Bouchard M.D.   On: 01/06/2015 17:06    Anti-infectives: Anti-infectives    Start     Dose/Rate Route Frequency Ordered Stop   01/06/15 1800  piperacillin-tazobactam (ZOSYN) IVPB 3.375 g     3.375 g 12.5 mL/hr over 240 Minutes Intravenous Every 8 hours 01/06/15 1732     01/06/15 1000  levofloxacin (LEVAQUIN) tablet 500 mg  Status:  Discontinued     500 mg Oral Daily 01/06/15 0907 01/06/15 1715   01/04/15 2359  vancomycin (VANCOCIN) IVPB 1000 mg/200 mL premix  Status:  Discontinued     1,000 mg 200 mL/hr over 60 Minutes Intravenous Every 12 hours 01/04/15 1659 01/06/15 0907   01/03/15 0400  vancomycin (VANCOCIN) IVPB 750 mg/150 ml premix  Status:  Discontinued     750 mg 150 mL/hr over 60 Minutes Intravenous Every 12 hours 01/02/15 1738 01/04/15 1659   01/02/15 2200  ceFEPIme (MAXIPIME) 1 g in dextrose 5 % 50 mL IVPB  Status:  Discontinued     1 g 100 mL/hr over 30 Minutes Intravenous 3 times per day 01/02/15 1457 01/06/15 0907   01/02/15 1500   vancomycin (VANCOCIN) 2,000 mg in sodium chloride 0.9 % 500 mL IVPB     2,000 mg 250 mL/hr over 120 Minutes Intravenous  Once 01/02/15 1453 01/02/15 1744   01/02/15 1500  ceFEPIme (MAXIPIME) 2 g in dextrose 5 % 50 mL IVPB     2 g 100 mL/hr over 30 Minutes Intravenous  Once 01/02/15 1457 01/02/15 1655       Assessment/Plan  1. Acute cholecystitis -plan for IR perc chole drain today -may have clear liquids after procedure -cont Zosyn for today, can likely convert to orals tomorrow depending on how the patient is doing 2. Lung cancer, COPD, PNA -per medicine   LOS: 5 days    Miklo Aken E 01/07/2015, 9:06 AM Pager: [phone]

## 2015-01-07 NOTE — Procedures (Signed)
Procedure:  Percutaneous cholecystostomy Findings:  10 Fr drain placed in gallbladder.  Attached to gravity bag drainage.  Bile sample sent for culture. Will follow tube output. No complications. EBL < 25 mL

## 2015-01-07 NOTE — Progress Notes (Addendum)
Clinical Social Work  Per MD, patient not medically stable to DC but possibly ready tomorrow. CSW updated [facility] who remains agreeable to accept patient. CSW updated patient and sister at bedside that [facility] remains agreeable to accept patient. Patient reports he is happy to go to the same facility as his sister and hopeful to DC soon. CSW will continue to follow.  [place], [place] [phone]

## 2015-01-07 NOTE — Consult Note (Signed)
Reason for consult: percutaneous cholecystostomy   Referring Physician(s): CCS  History of Present Illness: Tyler [name] is a 74 y.o. male history of COPD, lung cancer on chemotherapy, hypertension who was recently admitted for persistent abdominal pain with intermittent nausea vomiting, dyspnea, pneumonia and imaging findings concerning for acute cholecystitis. Patient was deemed high risk for percutaneous cholecystectomy at this time and request is now received for percutaneous cholecystostomy.  Past Medical History  Diagnosis Date  . Allergic rhinitis   . BPH (benign prostatic hyperplasia)   . GERD (gastroesophageal reflux disease)   . COPD (chronic obstructive pulmonary disease)   . Shortness of breath   . Arthritis   . Enlarged prostate   . Hypertension   . Mental disorder   . Constipation   . Chronic chest pain   . Pneumonia     hx  . 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 Ankle surgery  2005  . Prostate surgery  11/25/2010  . Hernia repair    . Mediastinoscopy  06/11/2012    Procedure: MEDIASTINOSCOPY;  Surgeon: Grace Isaac, MD;  Location: [place] [facility];  Service: Thoracic;  Laterality: N/A;  . Inguinal hernia repair      left  . Eye surgery      cataract right  . Video bronchoscopy with endobronchial ultrasound  10/08/2012    Procedure: VIDEO BRONCHOSCOPY WITH ENDOBRONCHIAL ULTRASOUND;  Surgeon: Collene Gobble, MD;  Location: [facility];  Service: Pulmonary;  Laterality: N/A;  . Video bronchoscopy Bilateral 04/11/2014    Procedure: VIDEO BRONCHOSCOPY WITHOUT FLUORO;  Surgeon: Collene Gobble, MD;  Location: [facility];  Service: Cardiopulmonary;  Laterality: Bilateral;    Allergies: Ketorolac tromethamine; Codeine; Morphine and related; Tramadol; and Tussionex pennkinetic er  Medications: Prior to Admission medications   Medication Sig Start Date End Date Taking? Authorizing Provider    albuterol (PROVENTIL HFA;VENTOLIN HFA) 108 (90 BASE) MCG/ACT inhaler Inhale 2 puffs into the lungs every 6 (six) hours as needed for wheezing or shortness of breath.   Yes Historical Provider, MD  albuterol (PROVENTIL) (2.5 MG/3ML) 0.083% nebulizer solution Take 3 mLs (2.5 mg total) by nebulization every 6 (six) hours as needed for wheezing. 04/14/14  Yes Collene Gobble, MD  amitriptyline (ELAVIL) 10 MG tablet Take 10 mg by mouth at bedtime as needed. 12/09/14  Yes Historical Provider, MD  dexamethasone (DECADRON) 4 MG tablet Take 1 tablet by mouth twice a day, the day before, the day of and the day after chemotherapy. Take with food 05/19/14  Yes Adrena E Johnson, PA-C  escitalopram (LEXAPRO) 20 MG tablet Take 20 mg by mouth daily.  08/16/14  Yes Historical Provider, MD  flurazepam (DALMANE) 30 MG capsule Take 30 mg by mouth at bedtime as needed for sleep.   Yes Historical Provider, MD  folic acid (FOLVITE) 1 MG tablet Take 1 tablet (1 mg total) by mouth daily. 11/19/14  Yes Adrena E Johnson, PA-C  LUMIGAN 0.01 % SOLN Place 1 drop into both eyes at bedtime.  12/10/14  Yes Historical Provider, MD  naproxen (NAPROSYN) 250 MG tablet Take 1 tablet by mouth 3 (three) times daily as needed (arthritis).  11/30/14  Yes Historical Provider, MD  nebivolol (BYSTOLIC) 10 MG tablet Take 10 mg by mouth 2 (two) times daily.    Yes Historical Provider, MD  omeprazole (PRILOSEC) 20 MG capsule Take 20 mg by mouth 2 (two)  times daily before a meal.    Yes Historical Provider, MD  oxyCODONE (ROXICODONE) 15 MG immediate release tablet Take 1 tablet (15 mg total) by mouth every 4 (four) hours as needed for pain. 12/08/14  Yes Carlton Adam, PA-C  PRESCRIPTION MEDICATION Chemotherapy at [facility] (Dr. Julien Nordmann).  Last dose of  Alimta 12/29/2014   Yes Historical Provider, MD  Tamsulosin HCl (FLOMAX) 0.4 MG CAPS Take 0.4 mg by mouth every morning.    Yes Historical Provider, MD  ondansetron (ZOFRAN) 8 MG tablet Take 1 tablet (8 mg  total) by mouth every 8 (eight) hours as needed for nausea or vomiting. Patient not taking: Reported on 12/29/2014 12/08/14   Carlton Adam, PA-C  prochlorperazine (COMPAZINE) 10 MG tablet Take 1 tablet (10 mg total) by mouth every 6 (six) hours as needed for nausea or vomiting. Patient not taking: Reported on 12/29/2014 12/08/14   Carlton Adam, PA-C     Family History  Problem Relation Age of Onset  . Heart disease Mother   . Arthritis Sister   . Arthritis Sister   . Liver cancer Father     History   Social History  . Marital Status: Single    Spouse Name: N/A  . Number of Children: 2  . Years of Education: N/A   Occupational History  . RETIRED     worked at a service station   Social History Main Topics  . Smoking status: Former Smoker -- 2.00 packs/day for 50 years    Types: Cigarettes    Quit date: 12/26/2009  . Smokeless tobacco: Never Used  . Alcohol Use: 14.4 oz/week    24 Cans of beer per week     Comment: 4 beers every night  . Drug Use: No  . Sexual Activity: No   Other Topics Concern  . None   Social History Narrative      Review of Systems: A 12 point ROS discussed and pertinent positives are indicated in the HPI above.  All other systems are negative.  Review of Systems  See above Vital Signs: BP 127/69 mmHg  Pulse 74  Temp(Src) 97.9 F (36.6 C) (Oral)  Resp 20  Ht 6\' 2"  (1.88 m)  Wt 228 lb (103.42 kg)  BMI 29.26 kg/m2  SpO2 97%  Physical Exam patient awake, alert; complaining of abdominal and back pain. Chest with diminished breath sounds at the bases. Heart with regular rate and rhythm. Abdomen soft, positive bowel sounds, tender primarily in the right upper quadrant and epigastric regions. Extremities full range of motion and no significant edema.  Mallampati Score:     Imaging: Dg Chest 2 View  01/06/2015   CLINICAL DATA:  Patient being treated for pneumonia. History of hypertension.  EXAM: CHEST  2 VIEW  COMPARISON:  Chest  radiograph 01/02/2015.  FINDINGS: Multiple monitoring leads overlie the patient. Stable cardiomegaly. Interval increase in left lower lobe heterogeneous opacities likely secondary to known left lower lobe pulmonary mass as well as adjacent pulmonary consolidation and left pleural effusion. Minimal right basilar atelectasis. Degenerative changes mid thoracic spine. Large hiatal hernia.  IMPRESSION: Increased heterogeneous opacities left lung base likely secondary to known left lower lobe pulmonary mass, associated peripheral heterogeneous opacities which may represent postobstructive atelectasis or pneumonia. Additionally there is a small left pleural effusion.   Electronically Signed   By: Lovey Newcomer M.D.   On: 01/06/2015 09:49   Dg Chest 2 View  01/02/2015   CLINICAL DATA:  Shortness  of breath.  EXAM: CHEST  2 VIEW  COMPARISON:  October 19, 2014.  FINDINGS: Stable cardiomediastinal silhouette. No pneumothorax or significant pleural effusion is noted. Right lung is clear. Mild left basilar opacity is noted most consistent with subsegmental atelectasis. Bony thorax is intact.  IMPRESSION: Mild left basilar subsegmental atelectasis.   Electronically Signed   By: Marijo Conception, M.D.   On: 01/02/2015 12:51   Ct Chest W Contrast  12/26/2014   CLINICAL DATA:  Stage III non-small-cell lung cancer. Chemotherapy in progress. Chest pain and shortness of breath.  EXAM: CT CHEST WITH CONTRAST  TECHNIQUE: Multidetector CT imaging of the chest was performed during intravenous contrast administration.  CONTRAST:  56mL OMNIPAQUE IOHEXOL 300 MG/ML  SOLN  COMPARISON:  CTA chest 10/19/14.  FINDINGS: No appreciable change in the spiculated LEFT lower lobe mass as measured on comparable image 29 series 3 to the previous image 49 series 7 radiology different. Moderate LEFT pleural effusion, not significantly increased. Moderate fibrotic change along the LEFT hemithorax, also stable. Mild peribronchial thickening LEFT lower lobe  not significantly worse.  No significant enlargement of the previously identified hilar and mediastinal nodes, up to 10 mm short axis LEFT paratracheal AP window.  Normal heart size. No pericardial effusion. Coronary artery calcification is present. Moderately large hiatal hernia projects into the retrocardiac region, stable. Moderate atheromatous change of the transverse descending aorta, without progression.  Unremarkable appearing upper abdominal structures.  Mild emphysematous change in the lung bases particularly is stable. No pulmonary nodules.  IMPRESSION: Stable LEFT lower lobe mass corresponding to the known lung cancer. No progression of disease.   Electronically Signed   By: Rolla Flatten M.D.   On: 12/26/2014 09:25   Ct Angio Chest Pe W/cm &/or Wo Cm  01/02/2015   CLINICAL DATA:  Short of breath. Onset of symptoms 3-4 days after last chemotherapy. LEFT lower lobe cancer. Chronic back pain.  EXAM: CT ANGIOGRAPHY CHEST WITH CONTRAST  TECHNIQUE: Multidetector CT imaging of the chest was performed using the standard protocol during bolus administration of intravenous contrast. Multiplanar CT image reconstructions and MIPs were obtained to evaluate the vascular anatomy.  CONTRAST:  115mL OMNIPAQUE IOHEXOL 350 MG/ML SOLN  COMPARISON:  10/19/2014.  12/26/2014.  FINDINGS: Bones: No aggressive osseous lesions. Lower cervical spondylosis. No thoracic spine compression fractures.  Cardiovascular: No acute aortic abnormality. Aortic arch atherosclerosis. Coronary artery atherosclerosis is present. If office based assessment of coronary risk factors has not been performed, it is now recommended.  No pulmonary embolus is present. There is extrinsic mass effect on the LEFT lower lobe pulmonary arteries associated with LEFT hilar and LEFT lower lobe mass. This nearly occludes the LEFT lower lobe pulmonary artery. No convincing evidence of acute pulmonary embolus or bland thrombus. The narrowing of the vessel appears  extrinsic rather than from filling defects.  Comparing the heart size to the recent prior exam, the heart appears more dilated on today's study with LEFT atrial enlargement.  Lungs:  LEFT lower lobe mass is present and has enlarged slightly compared to the prior exam. When measured on soft tissue windows, this measures 36 mm x 26 mm at a similar location to the prior exam. Soft tissue density extends into the LEFT hilum.  Distal to the mass, there is increasing collapse/consolidation of the superior segment of the LEFT lower lobe that is most compatible with postobstructive pneumonia.  Severe emphysema. There is multifocal multilobar patchy airspace opacity. Notably, this is new compared to the  recent chest CT. Interlobular septal thickening is present at the apices. The differential considerations are pulmonary edema versus infection. Aspiration is considered less likely. This is more prominent in the lingula and LEFT lower lobe.  Increasing collapse/consolidation in the basal LEFT lower lobe, some of which is probably due to lower volumes and effusion.  Central airways: Trachea patent. Extrinsic mass effect on the LEFT lung bronchi.  Effusions: Small LEFT-greater-than-RIGHT bilateral dependently layering pleural effusions. Small amount of posterior pericardial fluid or thickening is increased compared to prior.  Lymphadenopathy: No axillary adenopathy. Small prevascular nodes. Low RIGHT peritracheal old borderline adenopathy. This appears larger than prior. LEFT hilar mass and adenopathy associated with LEFT lower lobe tumor again noted.  Esophagus: Moderate hiatal hernia.  Esophagus appears normal.  Upper abdomen: Adrenal glands within normal limits. Old granulomatous disease in the liver.  Other: None.  Review of the MIP images confirms the above findings.  IMPRESSION: 1. Negative for pulmonary embolus. LEFT lower lobe superior segment pulmonary mass extending to the LEFT hilum today measuring 36 mm x 26 mm,  larger than on the most recent comparison. Extrinsic compression of the LEFT lower lobe airways and pulmonary vessels. 2. Constellation of findings compatible with mild CHF. Interstitial and mild alveolar or pulmonary edema is favored over multifocal bronchopneumonia. 3. Underlying emphysema and coronary artery disease. 4. Increasing consolidation of the LEFT lower lobe distal to the mass, suggesting postobstructive pneumonia.   Electronically Signed   By: Dereck Ligas M.D.   On: 01/02/2015 14:38   Ct Abdomen W Contrast  01/06/2015   CLINICAL DATA:  Epigastric pain and nausea for 3 days.  EXAM: CT ABDOMEN WITH CONTRAST  TECHNIQUE: Multidetector CT imaging of the abdomen was performed using the standard protocol following bolus administration of intravenous contrast.  CONTRAST:  130mL OMNIPAQUE IOHEXOL 300 MG/ML  SOLN  COMPARISON:  PET-CT scan 04/24/2014  FINDINGS: Lower chest: There are new bilateral small effusions left great than right. Mild atelectasis at the left lung base. No pericardial fluid. Large hiatal hernia noted posterior to right atrium.  Hepatobiliary: No focal hepatic lesion.  No biliary duct dilatation.  The gallbladder is distended to 49 mm. The gallbladder wall is mildly thickened. There is small amount of pericholecystic fluid. There is inflammatory stranding within the fat adjacent to the gallbladder (image 36 of series 10). There is inflation inflammation in the porta hepatis. The common bile duct appears normal caliber. There are no radiodense gallstones evident.  Pancreas: The pancreas is atrophic. There is no pancreatic duct dilatation or significant inflammation.  Spleen: Normal spleen.  Normal splenic vein.  Adrenals/urinary tract: Adrenal glands and kidneys are normal.  Stomach/Bowel: There is mild inflammation along the second portion duodenum. No bowel obstruction. Limited view of the small bowel and colon are unremarkable. The entirety of the bowel is not imaged.   Vascular/Lymphatic: Abdominal aorta is normal caliber without focal calcifications. No retroperitoneal periportal lymphadenopathy.  Musculoskeletal: No aggressive osseous lesion.  Other:  IMPRESSION: 1. Distended gallbladder with gallbladder wall thickening and pericholecystic fluid is most consistent with acute cholecystitis. 2. No biliary duct dilatation. 3. Pancreas appears normal. 4. Bilateral small effusions. 5. Large hiatal hernia. Findings conveyed toFloor nurse Davidon 01/06/2015  at17:06.   Electronically Signed   By: Suzy Bouchard M.D.   On: 01/06/2015 17:06    Labs:  CBC:  Recent Labs  01/04/15 0520 01/05/15 0540 01/06/15 0306 01/07/15 0510  WBC 1.7* 2.6* 5.1 10.3  HGB 9.5* 9.6* 9.8* 9.6*  HCT 27.7* 28.2* 29.1* 27.8*  PLT 140* 100* 89* 85*    COAGS:  Recent Labs  01/04/15 0520 01/07/15 0510  INR 1.08 1.22  APTT  --  33    BMP:  Recent Labs  01/03/15 0523 01/04/15 0520 01/05/15 0540 01/06/15 0306  NA 137 134* 137 135  K 3.8 3.5 3.4* 3.6  CL 98 97 101 101  CO2 31 30 31 30   GLUCOSE 132* 127* 117* 165*  BUN 15 13 11 12   CALCIUM 8.8 8.6 8.7 8.5  CREATININE 0.83 0.85 0.83 0.78  GFRNONAA 86* 85* 86* 88*  GFRAA >90 >90 >90 >90    LIVER FUNCTION TESTS:  Recent Labs  01/03/15 0523 01/04/15 0520 01/05/15 0540 01/06/15 0306  BILITOT 0.9 0.8 0.8 1.0  AST 21 22 23 26   ALT 19 18 16 17   ALKPHOS 62 65 63 74  PROT 6.3 6.2 6.2 6.7  ALBUMIN 2.9* 3.0* 3.0* 3.2*    TUMOR MARKERS: No results for input(s): AFPTM, CEA, CA199, CHROMGRNA in the last 8760 hours.  Assessment and Plan: KYLLE LALL is a 74 y.o. male history of COPD, lung cancer on chemotherapy, hypertension who was recently admitted for persistent abdominal pain with intermittent nausea, vomiting, dyspnea, pneumonia and imaging findings concerning for acute cholecystitis. Patient was deemed high risk for percutaneous cholecystectomy at this time and request is now received for percutaneous  cholecystostomy. Patient is currently afebrile with normal white count, normal LFTs and mild thrombocytopenia. Imaging studies have been reviewed. Details/risks of procedure including but not limited to internal bleeding, infection/sepsis and need for emergent surgery discussed with patient and daughter with their apparent understanding and consent. Procedure scheduled for later this morning.       Signed: Autumn Messing 01/07/2015, 9:18 AM   I spent a total of 20 minutes  in face to face in clinical consultation, greater than 50% of which was counseling/coordinating care for percutaneous cholecystostomy

## 2015-01-07 NOTE — Progress Notes (Signed)
[facility]                                                                              Patient Demographics  Tyler [name], is a 74 y.o. male, DOB - 1941-09-17, XTK:[id number]  Admit date - 01/02/2015   Admitting Physician Tyler Eva, MD  Outpatient Primary MD for the patient is Tyler Roers, MD  LOS - 5   Chief Complaint  Patient presents with  . Shortness of Breath      HPI on 01/02/2015 by Dr. Shanon Brow [name] 74 year old male with a history of COPD, hypertension, non-small cell lung carcinoma diagnosed in June 2015 (Dr. Julien [name]), depression presents with 3 day history of worsening shortness of breath. At baseline, the patient uses 2 L nasal cannula when necessary activity and shortness of breath at home. The patient denies any hemoptysis but complains of a cough with white sputum. He denies any fevers, chills, hemoptysis, nausea, vomiting, diarrhea. There's been no orthopnea or PND. The patient last received chemotherapy proximally 4 days prior to this admission. He denies any abdominal pain, dysuria, hematochezia, melena.  In the emergency department, hemoglobin was 10.5, WBC 5.1, sodium 132. Otherwise BMP was unremarkable. EKG shows sinus rhythm without any ST-T wave changes. CT abdomen and chest was negative for pulmonary embolus but showed his usual left lower lobe lung mass. There was increased consolidation and collapse in the left lower lobe. There was also multifocal patchy opacities.  Assessment & Plan   Acute cholecystitis -CT of the abdomen: distended gallbladder, consistent acute cholecystitis -Gen. surgery consulted and appreciated, recommended IR for percutaneous drainage -IR consulted and appreciated -Continue Zosyn -Continue pain control  Possible postobstructive pneumonia -Patient initially placed on vancomycin and cefepime, then placed on Levaquin- but discontinued 3/8 -Strep and legionella urine antigens negative  Metastatic non-small cell lung cancer -Dr.  Julien [name], oncology made aware of patient's admission  Depression -Restart her Lexapro and Restoril -Elavil held  BPH status post TURP -Continue Flomax  Asymptomatic bacteriuria -Urine culture negative  Chemotherapy-induced neutropenia -Secondary to Alimta -[facility] hospitalist had discussion with Dr. Julien [name], Granix held  Elevated BNP -Echocardiogram EF 55-60%  Code Status: Full  Family Communication: None at bedside.  Disposition Plan: Admitted, drain placed today by IR- continue IV antibiotics, possible D/c 01/08/2015  Time Spent in minutes   30 minutes  Procedures  Percutaneous cholecystectomy  Consults   Interventional radiology General surgery  DVT Prophylaxis  SCDs  Lab Results  Component Value Date   PLT 85* 01/07/2015    Medications  Scheduled Meds: . acetaminophen  1,000 mg Oral TID  . fluticasone  1 spray Each Nare Daily  . latanoprost  1 drop Both Eyes QHS  . lidocaine      . lidocaine      . lip balm  1 application Topical BID  . metoprolol  5 mg Intravenous 4 times per day  . piperacillin-tazobactam (ZOSYN)  IV  3.375 g Intravenous Q8H  . tamsulosin  0.4 mg Oral q morning - 10a   Continuous Infusions: . lactated ringers 50 mL/hr at 01/06/15 1845   PRN Meds:.albuterol, alum & mag hydroxide-simeth, HYDROmorphone (DILAUDID) injection, magic mouthwash, menthol-cetylpyridinium, [DISCONTINUED] ondansetron **OR** ondansetron (  ZOFRAN) IV, oxyCODONE, phenol, promethazine  Antibiotics    Anti-infectives    Start     Dose/Rate Route Frequency Ordered Stop   01/06/15 1800  piperacillin-tazobactam (ZOSYN) IVPB 3.375 g     3.375 g 12.5 mL/hr over 240 Minutes Intravenous Every 8 hours 01/06/15 1732     01/06/15 1000  levofloxacin (LEVAQUIN) tablet 500 mg  Status:  Discontinued     500 mg Oral Daily 01/06/15 0907 01/06/15 1715   01/04/15 2359  vancomycin (VANCOCIN) IVPB 1000 mg/200 mL premix  Status:  Discontinued     1,000 mg 200 mL/hr over 60 Minutes  Intravenous Every 12 hours 01/04/15 1659 01/06/15 0907   01/03/15 0400  vancomycin (VANCOCIN) IVPB 750 mg/150 ml premix  Status:  Discontinued     750 mg 150 mL/hr over 60 Minutes Intravenous Every 12 hours 01/02/15 1738 01/04/15 1659   01/02/15 2200  ceFEPIme (MAXIPIME) 1 g in dextrose 5 % 50 mL IVPB  Status:  Discontinued     1 g 100 mL/hr over 30 Minutes Intravenous 3 times per day 01/02/15 1457 01/06/15 0907   01/02/15 1500  vancomycin (VANCOCIN) 2,000 mg in sodium chloride 0.9 % 500 mL IVPB     2,000 mg 250 mL/hr over 120 Minutes Intravenous  Once 01/02/15 1453 01/02/15 1744   01/02/15 1500  ceFEPIme (MAXIPIME) 2 g in dextrose 5 % 50 mL IVPB     2 g 100 mL/hr over 30 Minutes Intravenous  Once 01/02/15 1457 01/02/15 1655        Subjective:   Tyler [name] seen and examined today.  Patient is to have some abdominal pain however states it is improved as compared to previous stay. Denies nausea or vomiting. Patient continues to complain of back pain which is chronic.   Objective:   Filed Vitals:   01/07/15 1200 01/07/15 1235 01/07/15 1300 01/07/15 1416  BP: 158/76 145/64 150/62 110/63  Pulse: 78 73 74 79  Temp:  97.5 F (36.4 C) 98.4 F (36.9 C) 98.8 F (37.1 C)  TempSrc:    Oral  Resp: 16 18 18 18   Height:      Weight:      SpO2: 95% 96% 97% 92%    Wt Readings from Last 3 Encounters:  01/02/15 103.42 kg (228 lb)  12/29/14 103.511 kg (228 lb 3.2 oz)  12/08/14 102.921 kg (226 lb 14.4 oz)     Intake/Output Summary (Last 24 hours) at 01/07/15 1559 Last data filed at 01/07/15 1416  Gross per 24 hour  Intake      0 ml  Output    620 ml  Net   -620 ml    Exam  General: Well developed, well nourished, NAD, appears stated age  67: NCAT, mucous membranes moist.   Cardiovascular: S1 S2 auscultated, no rubs, murmurs or gallops. Regular rate and rhythm.  Respiratory: Diminished breath sounds at the lung basis  Abdomen: Soft, RUQ TTP, nondistended, + bowel  sounds  Extremities: warm dry without cyanosis clubbing or edema  Neuro: AAOx3, nonfocal  Data Review   Micro Results Recent Results (from the past 240 hour(s))  Blood culture (routine x 2)     Status: None (Preliminary result)   Collection Time: 01/02/15  3:19 PM  Result Value Ref Range Status   Specimen Description BLOOD LEFT ARM  Final   Special Requests   Final    BOTTLES DRAWN AEROBIC AND ANAEROBIC 5CC BOTH BOTTLES   Culture   Final  BLOOD CULTURE RECEIVED NO GROWTH TO DATE CULTURE WILL BE HELD FOR 5 DAYS BEFORE ISSUING A FINAL NEGATIVE REPORT Performed at [facility]    Report Status PENDING  Incomplete  Blood culture (routine x 2)     Status: None (Preliminary result)   Collection Time: 01/02/15  3:20 PM  Result Value Ref Range Status   Specimen Description BLOOD LEFT HAND  Final   Special Requests BOTTLES DRAWN AEROBIC ONLY 2CC  Final   Culture   Final           BLOOD CULTURE RECEIVED NO GROWTH TO DATE CULTURE WILL BE HELD FOR 5 DAYS BEFORE ISSUING A FINAL NEGATIVE REPORT Performed at [facility]    Report Status PENDING  Incomplete  Culture, Urine     Status: None   Collection Time: 01/04/15  6:03 PM  Result Value Ref Range Status   Specimen Description URINE, CLEAN CATCH  Final   Special Requests NONE  Final   Colony Count NO GROWTH Performed at [facility]   Final   Culture NO GROWTH Performed at [facility]   Final   Report Status 01/05/2015 FINAL  Final    Radiology Reports Dg Chest 2 View  01/06/2015   CLINICAL DATA:  Patient being treated for pneumonia. History of hypertension.  EXAM: CHEST  2 VIEW  COMPARISON:  Chest radiograph 01/02/2015.  FINDINGS: Multiple monitoring leads overlie the patient. Stable cardiomegaly. Interval increase in left lower lobe heterogeneous opacities likely secondary to known left lower lobe pulmonary mass as well as adjacent pulmonary consolidation and left pleural effusion. Minimal  right basilar atelectasis. Degenerative changes mid thoracic spine. Large hiatal hernia.  IMPRESSION: Increased heterogeneous opacities left lung base likely secondary to known left lower lobe pulmonary mass, associated peripheral heterogeneous opacities which may represent postobstructive atelectasis or pneumonia. Additionally there is a small left pleural effusion.   Electronically Signed   By: Lovey Newcomer M.D.   On: 01/06/2015 09:49   Dg Chest 2 View  01/02/2015   CLINICAL DATA:  Shortness of breath.  EXAM: CHEST  2 VIEW  COMPARISON:  October 19, 2014.  FINDINGS: Stable cardiomediastinal silhouette. No pneumothorax or significant pleural effusion is noted. Right lung is clear. Mild left basilar opacity is noted most consistent with subsegmental atelectasis. Bony thorax is intact.  IMPRESSION: Mild left basilar subsegmental atelectasis.   Electronically Signed   By: Marijo Conception, M.D.   On: 01/02/2015 12:51   Ct Chest W Contrast  12/26/2014   CLINICAL DATA:  Stage III non-small-cell lung cancer. Chemotherapy in progress. Chest pain and shortness of breath.  EXAM: CT CHEST WITH CONTRAST  TECHNIQUE: Multidetector CT imaging of the chest was performed during intravenous contrast administration.  CONTRAST:  53mL OMNIPAQUE IOHEXOL 300 MG/ML  SOLN  COMPARISON:  CTA chest 10/19/14.  FINDINGS: No appreciable change in the spiculated LEFT lower lobe mass as measured on comparable image 29 series 3 to the previous image 49 series 7 radiology different. Moderate LEFT pleural effusion, not significantly increased. Moderate fibrotic change along the LEFT hemithorax, also stable. Mild peribronchial thickening LEFT lower lobe not significantly worse.  No significant enlargement of the previously identified hilar and mediastinal nodes, up to 10 mm short axis LEFT paratracheal AP window.  Normal heart size. No pericardial effusion. Coronary artery calcification is present. Moderately large hiatal hernia projects into the  retrocardiac region, stable. Moderate atheromatous change of the transverse descending aorta, without progression.  Unremarkable  appearing upper abdominal structures.  Mild emphysematous change in the lung bases particularly is stable. No pulmonary nodules.  IMPRESSION: Stable LEFT lower lobe mass corresponding to the known lung cancer. No progression of disease.   Electronically Signed   By: Rolla Flatten M.D.   On: 12/26/2014 09:25   Ct Angio Chest Pe W/cm &/or Wo Cm  01/02/2015   CLINICAL DATA:  Short of breath. Onset of symptoms 3-4 days after last chemotherapy. LEFT lower lobe cancer. Chronic back pain.  EXAM: CT ANGIOGRAPHY CHEST WITH CONTRAST  TECHNIQUE: Multidetector CT imaging of the chest was performed using the standard protocol during bolus administration of intravenous contrast. Multiplanar CT image reconstructions and MIPs were obtained to evaluate the vascular anatomy.  CONTRAST:  166mL OMNIPAQUE IOHEXOL 350 MG/ML SOLN  COMPARISON:  10/19/2014.  12/26/2014.  FINDINGS: Bones: No aggressive osseous lesions. Lower cervical spondylosis. No thoracic spine compression fractures.  Cardiovascular: No acute aortic abnormality. Aortic arch atherosclerosis. Coronary artery atherosclerosis is present. If office based assessment of coronary risk factors has not been performed, it is now recommended.  No pulmonary embolus is present. There is extrinsic mass effect on the LEFT lower lobe pulmonary arteries associated with LEFT hilar and LEFT lower lobe mass. This nearly occludes the LEFT lower lobe pulmonary artery. No convincing evidence of acute pulmonary embolus or bland thrombus. The narrowing of the vessel appears extrinsic rather than from filling defects.  Comparing the heart size to the recent prior exam, the heart appears more dilated on today's study with LEFT atrial enlargement.  Lungs:  LEFT lower lobe mass is present and has enlarged slightly compared to the prior exam. When measured on soft tissue  windows, this measures 36 mm x 26 mm at a similar location to the prior exam. Soft tissue density extends into the LEFT hilum.  Distal to the mass, there is increasing collapse/consolidation of the superior segment of the LEFT lower lobe that is most compatible with postobstructive pneumonia.  Severe emphysema. There is multifocal multilobar patchy airspace opacity. Notably, this is new compared to the recent chest CT. Interlobular septal thickening is present at the apices. The differential considerations are pulmonary edema versus infection. Aspiration is considered less likely. This is more prominent in the lingula and LEFT lower lobe.  Increasing collapse/consolidation in the basal LEFT lower lobe, some of which is probably due to lower volumes and effusion.  Central airways: Trachea patent. Extrinsic mass effect on the LEFT lung bronchi.  Effusions: Small LEFT-greater-than-RIGHT bilateral dependently layering pleural effusions. Small amount of posterior pericardial fluid or thickening is increased compared to prior.  Lymphadenopathy: No axillary adenopathy. Small prevascular nodes. Low RIGHT peritracheal old borderline adenopathy. This appears larger than prior. LEFT hilar mass and adenopathy associated with LEFT lower lobe tumor again noted.  Esophagus: Moderate hiatal hernia.  Esophagus appears normal.  Upper abdomen: Adrenal glands within normal limits. Old granulomatous disease in the liver.  Other: None.  Review of the MIP images confirms the above findings.  IMPRESSION: 1. Negative for pulmonary embolus. LEFT lower lobe superior segment pulmonary mass extending to the LEFT hilum today measuring 36 mm x 26 mm, larger than on the most recent comparison. Extrinsic compression of the LEFT lower lobe airways and pulmonary vessels. 2. Constellation of findings compatible with mild CHF. Interstitial and mild alveolar or pulmonary edema is favored over multifocal bronchopneumonia. 3. Underlying emphysema and  coronary artery disease. 4. Increasing consolidation of the LEFT lower lobe distal to the mass, suggesting postobstructive  pneumonia.   Electronically Signed   By: Dereck Ligas M.D.   On: 01/02/2015 14:38   Ct Abdomen W Contrast  01/06/2015   CLINICAL DATA:  Epigastric pain and nausea for 3 days.  EXAM: CT ABDOMEN WITH CONTRAST  TECHNIQUE: Multidetector CT imaging of the abdomen was performed using the standard protocol following bolus administration of intravenous contrast.  CONTRAST:  179mL OMNIPAQUE IOHEXOL 300 MG/ML  SOLN  COMPARISON:  PET-CT scan 04/24/2014  FINDINGS: Lower chest: There are new bilateral small effusions left great than right. Mild atelectasis at the left lung base. No pericardial fluid. Large hiatal hernia noted posterior to right atrium.  Hepatobiliary: No focal hepatic lesion.  No biliary duct dilatation.  The gallbladder is distended to 49 mm. The gallbladder wall is mildly thickened. There is small amount of pericholecystic fluid. There is inflammatory stranding within the fat adjacent to the gallbladder (image 36 of series 10). There is inflation inflammation in the porta hepatis. The common bile duct appears normal caliber. There are no radiodense gallstones evident.  Pancreas: The pancreas is atrophic. There is no pancreatic duct dilatation or significant inflammation.  Spleen: Normal spleen.  Normal splenic vein.  Adrenals/urinary tract: Adrenal glands and kidneys are normal.  Stomach/Bowel: There is mild inflammation along the second portion duodenum. No bowel obstruction. Limited view of the small bowel and colon are unremarkable. The entirety of the bowel is not imaged.  Vascular/Lymphatic: Abdominal aorta is normal caliber without focal calcifications. No retroperitoneal periportal lymphadenopathy.  Musculoskeletal: No aggressive osseous lesion.  Other:  IMPRESSION: 1. Distended gallbladder with gallbladder wall thickening and pericholecystic fluid is most consistent with acute  cholecystitis. 2. No biliary duct dilatation. 3. Pancreas appears normal. 4. Bilateral small effusions. 5. Large hiatal hernia. Findings conveyed toFloor nurse Davidon 01/06/2015  at17:06.   Electronically Signed   By: Suzy Bouchard M.D.   On: 01/06/2015 17:06   Ir Perc Cholecystostomy  01/07/2015   CLINICAL DATA:  Acute calculus cholecystitis. The patient is not a candidate for immediate cholecystectomy and requires placement of a percutaneous cholecystostomy tube.  EXAM: PERCUTANEOUS CHOLECYSTOSTOMY  COMPARISON:  CT of the abdomen on 01/06/2015  ANESTHESIA/SEDATION: 2.0 mg IV Versed; 100 mcg IV Fentanyl.  Total Moderate Sedation Time  17 minutes  CONTRAST:  51mL OMNIPAQUE IOHEXOL 300 MG/ML  SOLN  MEDICATIONS: A schedule dose of 3.375 g IV Zosyn was infusing during the procedure.  FLUOROSCOPY TIME:  42 seconds.  PROCEDURE: The procedure, risks, benefits, and alternatives were explained to the patient. Questions regarding the procedure were encouraged and answered. The patient understands and consents to the procedure. A timeout was performed prior to the procedure.  The right abdominal wall was prepped with Betadine in a sterile fashion, and a sterile drape was applied covering the operative field. A sterile gown and sterile gloves were used for the procedure. Local anesthesia was provided with 1% Lidocaine.  Ultrasound was utilized to localize the gallbladder. Under direct ultrasound guidance, a 21 gauge needle was advanced via a transhepatic approach into the gallbladder lumen. Ultrasound image documentation was performed. Aspiration was performed and a bile sample sent for culture studies. A small amount of diluted contrast material was injected. A guide wire was then advanced into the gallbladder. A transitional dilator was placed.  Percutaneous tract dilatation was then performed over a guide wire to 10-French. A 10-French pigtail drainage catheter was then advanced into the gallbladder lumen under  fluoroscopy. Catheter was formed and injected with contrast material to  confirm position. The catheter was flushed and connected to a gravity drainage bag. It was secured at the skin with a Prolene retention suture and Stat-Lock device.  COMPLICATIONS: None  FINDINGS: After needle puncture of the gallbladder, a bile sample was aspirated and sent for culture. The cholecystostomy tube was advanced into the gallbladder lumen and formed. It is now draining bile. This tube will be left to gravity drainage.  IMPRESSION: Percutaneous cholecystostomy with placement of 10-French drainage catheter into the gallbladder lumen. This was left to gravity drainage.   Electronically Signed   By: Aletta Edouard M.D.   On: 01/07/2015 13:53    CBC  Recent Labs Lab 01/03/15 0523 01/04/15 0520 01/05/15 0540 01/06/15 0306 01/07/15 0510  WBC 2.6* 1.7* 2.6* 5.1 10.3  HGB 9.9* 9.5* 9.6* 9.8* 9.6*  HCT 28.8* 27.7* 28.2* 29.1* 27.8*  PLT 139* 140* 100* 89* 85*  MCV 100.0 99.3 98.6 97.7 98.9  MCH 34.4* 34.1* 33.6 32.9 34.2*  MCHC 34.4 34.3 34.0 33.7 34.5  RDW 13.8 13.5 13.6 13.6 14.0  LYMPHSABS  --  1.0 1.1 0.8 1.3  MONOABS  --  0.1 0.3 0.7 1.1*  EOSABS  --  0.1 0.1 0.1 0.1  BASOSABS  --  0.0 0.0 0.1 0.0    Chemistries   Recent Labs Lab 01/02/15 1104 01/03/15 0523 01/04/15 0520 01/05/15 0540 01/06/15 0306  NA 132* 137 134* 137 135  K 3.5 3.8 3.5 3.4* 3.6  CL 96 98 97 101 101  CO2 29 31 30 31 30   GLUCOSE 113* 132* 127* 117* 165*  BUN 18 15 13 11 12   CREATININE 0.91 0.83 0.85 0.83 0.78  CALCIUM 8.7 8.8 8.6 8.7 8.5  AST  --  21 22 23 26   ALT  --  19 18 16 17   ALKPHOS  --  62 65 63 74  BILITOT  --  0.9 0.8 0.8 1.0   ------------------------------------------------------------------------------------------------------------------ estimated creatinine clearance is 107.1 mL/min (by C-G formula based on Cr of  0.78). ------------------------------------------------------------------------------------------------------------------ No results for input(s): HGBA1C in the last 72 hours. ------------------------------------------------------------------------------------------------------------------ No results for input(s): CHOL, HDL, LDLCALC, TRIG, CHOLHDL, LDLDIRECT in the last 72 hours. ------------------------------------------------------------------------------------------------------------------ No results for input(s): TSH, T4TOTAL, T3FREE, THYROIDAB in the last 72 hours.  Invalid input(s): FREET3 ------------------------------------------------------------------------------------------------------------------ No results for input(s): VITAMINB12, FOLATE, FERRITIN, TIBC, IRON, RETICCTPCT in the last 72 hours.  Coagulation profile  Recent Labs Lab 01/04/15 0520 01/07/15 0510  INR 1.08 1.22    No results for input(s): DDIMER in the last 72 hours.  Cardiac Enzymes  Recent Labs Lab 01/06/15 0306 01/06/15 0817 01/06/15 1425  TROPONINI <0.03 <0.03 <0.03   ------------------------------------------------------------------------------------------------------------------ Invalid input(s): POCBNP    Aemilia Dedrick D.O. on 01/07/2015 at 3:59 PM  Between 7am to 7pm - Pager - [phone]  After 7pm go to [email or web address] - password [email or web address]  And look for the night coverage person covering for me after hours  [facility] Office  [phone]

## 2015-01-07 NOTE — Progress Notes (Signed)
PT Cancellation Note  Patient Details Name: Tyler [name] MRN: [id number] DOB: January 16, 1941   Cancelled Treatment:    Reason Eval/Treat Not Completed: Pain limiting ability to participate. Pt had agreed to PT and then when about to stand pt declined saying it was going to get him hurting too bad.  Pt scheduled for OR this afternoon. Will check back tomorrow.   Karleigh Bunte LUBECK 01/07/2015, 9:49 AM

## 2015-01-08 LAB — CBC
HCT: 27 % — ABNORMAL LOW (ref 39.0–52.0)
Hemoglobin: 9 g/dL — ABNORMAL LOW (ref 13.0–17.0)
MCH: 33.3 pg (ref 26.0–34.0)
MCHC: 33.3 g/dL (ref 30.0–36.0)
MCV: 100 fL (ref 78.0–100.0)
Platelets: 98 10*3/uL — ABNORMAL LOW (ref 150–400)
RBC: 2.7 MIL/uL — ABNORMAL LOW (ref 4.22–5.81)
RDW: 14.2 % (ref 11.5–15.5)
WBC: 9.1 10*3/uL (ref 4.0–10.5)

## 2015-01-08 LAB — BASIC METABOLIC PANEL
ANION GAP: 8 (ref 5–15)
BUN: 12 mg/dL (ref 6–23)
CHLORIDE: 95 mmol/L — AB (ref 96–112)
CO2: 35 mmol/L — ABNORMAL HIGH (ref 19–32)
Calcium: 8.8 mg/dL (ref 8.4–10.5)
Creatinine, Ser: 0.83 mg/dL (ref 0.50–1.35)
GFR calc Af Amer: >90 mL/min (ref >90)
GFR calc non Af Amer: 86 mL/min — ABNORMAL LOW (ref >90)
Glucose, Bld: 118 mg/dL — ABNORMAL HIGH (ref 70–99)
POTASSIUM: 3.3 mmol/L — AB (ref 3.5–5.1)
SODIUM: 138 mmol/L (ref 135–145)

## 2015-01-08 LAB — CULTURE, BLOOD (ROUTINE X 2)
CULTURE: NO GROWTH
Culture: NO GROWTH

## 2015-01-08 MED ORDER — OXYCODONE HCL 15 MG PO TABS: 15.0000 mg | ORAL_TABLET | ORAL | Status: DC | PRN

## 2015-01-08 MED ORDER — AMOXICILLIN-POT CLAVULANATE 875-125 MG PO TABS: 1.0000 | ORAL_TABLET | Freq: Two times a day (BID) | ORAL | Status: DC

## 2015-01-08 MED ORDER — ALUM & MAG HYDROXIDE-SIMETH 200-200-20 MG/5ML PO SUSP: 30.0000 mL | Freq: Four times a day (QID) | ORAL | Status: AC | PRN

## 2015-01-08 MED ORDER — FLUTICASONE PROPIONATE 50 MCG/ACT NA SUSP: 1.0000 | Freq: Every day | NASAL | Status: AC

## 2015-01-08 NOTE — Progress Notes (Signed)
Gave report to Claiborne Billings, Therapist, sports at [facility]. Left number if she had additional questions.

## 2015-01-08 NOTE — Progress Notes (Signed)
Patient ID: Tyler [name], male   DOB: [birth year], 74 y.o.   MRN: [id number]    Subjective: Pt feels much better today with the drain in place.  Tolerating his diet  Objective: Vital signs in last 24 hours: Temp:  [97.5 F (36.4 C)-98.8 F (37.1 C)] 98.8 F (37.1 C) (03/10 0558) Pulse Rate:  [64-80] 64 (03/10 0558) Resp:  [13-23] 16 (03/10 0558) BP: (110-168)/(60-88) 125/60 mmHg (03/10 0558) SpO2:  [92 %-98 %] 98 % (03/10 0558) Last BM Date: 01/07/15  Intake/Output from previous day: 03/09 0701 - 03/10 0700 In: 160 [P.O.:160] Out: 720 [Urine:520; Drains:200] Intake/Output this shift:    PE: Abd: soft, much less tender, drain in place with bilious output, +BS, ND  Lab Results:   Recent Labs  01/07/15 0510 01/08/15 0518  WBC 10.3 9.1  HGB 9.6* 9.0*  HCT 27.8* 27.0*  PLT 85* 98*   BMET  Recent Labs  01/06/15 0306 01/08/15 0518  NA 135 138  K 3.6 3.3*  CL 101 95*  CO2 30 35*  GLUCOSE 165* 118*  BUN 12 12  CREATININE 0.78 0.83  CALCIUM 8.5 8.8   PT/INR  Recent Labs  01/07/15 0510  LABPROT 15.5*  INR 1.22   CMP     Component Value Date/Time   NA 138 01/08/2015 0518   NA 142 12/29/2014 0812   K 3.3* 01/08/2015 0518   K 3.9 12/29/2014 0812   CL 95* 01/08/2015 0518   CO2 35* 01/08/2015 0518   CO2 27 12/29/2014 0812   GLUCOSE 118* 01/08/2015 0518   GLUCOSE 100 12/29/2014 0812   BUN 12 01/08/2015 0518   BUN 16.3 12/29/2014 0812   CREATININE 0.83 01/08/2015 0518   CREATININE 0.9 12/29/2014 0812   CALCIUM 8.8 01/08/2015 0518   CALCIUM 9.3 12/29/2014 0812   PROT 6.7 01/06/2015 0306   PROT 6.3* 12/29/2014 0812   ALBUMIN 3.2* 01/06/2015 0306   ALBUMIN 2.9* 12/29/2014 0812   AST 26 01/06/2015 0306   AST 19 12/29/2014 0812   ALT 17 01/06/2015 0306   ALT 18 12/29/2014 0812   ALKPHOS 74 01/06/2015 0306   ALKPHOS 94 12/29/2014 0812   BILITOT 1.0 01/06/2015 0306   BILITOT 0.32 12/29/2014 0812   GFRNONAA 86* 01/08/2015 0518   GFRAA >90 01/08/2015  0518   Lipase     Component Value Date/Time   LIPASE 12 01/06/2015 0930       Studies/Results: Dg Chest 2 View  01/06/2015   CLINICAL DATA:  Patient being treated for pneumonia. History of hypertension.  EXAM: CHEST  2 VIEW  COMPARISON:  Chest radiograph 01/02/2015.  FINDINGS: Multiple monitoring leads overlie the patient. Stable cardiomegaly. Interval increase in left lower lobe heterogeneous opacities likely secondary to known left lower lobe pulmonary mass as well as adjacent pulmonary consolidation and left pleural effusion. Minimal right basilar atelectasis. Degenerative changes mid thoracic spine. Large hiatal hernia.  IMPRESSION: Increased heterogeneous opacities left lung base likely secondary to known left lower lobe pulmonary mass, associated peripheral heterogeneous opacities which may represent postobstructive atelectasis or pneumonia. Additionally there is a small left pleural effusion.   Electronically Signed   By: Lovey Newcomer M.D.   On: 01/06/2015 09:49   Ct Abdomen W Contrast  01/06/2015   CLINICAL DATA:  Epigastric pain and nausea for 3 days.  EXAM: CT ABDOMEN WITH CONTRAST  TECHNIQUE: Multidetector CT imaging of the abdomen was performed using the standard protocol following bolus administration of intravenous contrast.  CONTRAST:  141mL OMNIPAQUE IOHEXOL 300 MG/ML  SOLN  COMPARISON:  PET-CT scan 04/24/2014  FINDINGS: Lower chest: There are new bilateral small effusions left great than right. Mild atelectasis at the left lung base. No pericardial fluid. Large hiatal hernia noted posterior to right atrium.  Hepatobiliary: No focal hepatic lesion.  No biliary duct dilatation.  The gallbladder is distended to 49 mm. The gallbladder wall is mildly thickened. There is small amount of pericholecystic fluid. There is inflammatory stranding within the fat adjacent to the gallbladder (image 36 of series 10). There is inflation inflammation in the porta hepatis. The common bile duct appears  normal caliber. There are no radiodense gallstones evident.  Pancreas: The pancreas is atrophic. There is no pancreatic duct dilatation or significant inflammation.  Spleen: Normal spleen.  Normal splenic vein.  Adrenals/urinary tract: Adrenal glands and kidneys are normal.  Stomach/Bowel: There is mild inflammation along the second portion duodenum. No bowel obstruction. Limited view of the small bowel and colon are unremarkable. The entirety of the bowel is not imaged.  Vascular/Lymphatic: Abdominal aorta is normal caliber without focal calcifications. No retroperitoneal periportal lymphadenopathy.  Musculoskeletal: No aggressive osseous lesion.  Other:  IMPRESSION: 1. Distended gallbladder with gallbladder wall thickening and pericholecystic fluid is most consistent with acute cholecystitis. 2. No biliary duct dilatation. 3. Pancreas appears normal. 4. Bilateral small effusions. 5. Large hiatal hernia. Findings conveyed toFloor nurse Davidon 01/06/2015  at17:06.   Electronically Signed   By: Suzy Bouchard M.D.   On: 01/06/2015 17:06   Ir Perc Cholecystostomy  01/07/2015   CLINICAL DATA:  Acute calculus cholecystitis. The patient is not a candidate for immediate cholecystectomy and requires placement of a percutaneous cholecystostomy tube.  EXAM: PERCUTANEOUS CHOLECYSTOSTOMY  COMPARISON:  CT of the abdomen on 01/06/2015  ANESTHESIA/SEDATION: 2.0 mg IV Versed; 100 mcg IV Fentanyl.  Total Moderate Sedation Time  17 minutes  CONTRAST:  24mL OMNIPAQUE IOHEXOL 300 MG/ML  SOLN  MEDICATIONS: A schedule dose of 3.375 g IV Zosyn was infusing during the procedure.  FLUOROSCOPY TIME:  42 seconds.  PROCEDURE: The procedure, risks, benefits, and alternatives were explained to the patient. Questions regarding the procedure were encouraged and answered. The patient understands and consents to the procedure. A timeout was performed prior to the procedure.  The right abdominal wall was prepped with Betadine in a sterile  fashion, and a sterile drape was applied covering the operative field. A sterile gown and sterile gloves were used for the procedure. Local anesthesia was provided with 1% Lidocaine.  Ultrasound was utilized to localize the gallbladder. Under direct ultrasound guidance, a 21 gauge needle was advanced via a transhepatic approach into the gallbladder lumen. Ultrasound image documentation was performed. Aspiration was performed and a bile sample sent for culture studies. A small amount of diluted contrast material was injected. A guide wire was then advanced into the gallbladder. A transitional dilator was placed.  Percutaneous tract dilatation was then performed over a guide wire to 10-French. A 10-French pigtail drainage catheter was then advanced into the gallbladder lumen under fluoroscopy. Catheter was formed and injected with contrast material to confirm position. The catheter was flushed and connected to a gravity drainage bag. It was secured at the skin with a Prolene retention suture and Stat-Lock device.  COMPLICATIONS: None  FINDINGS: After needle puncture of the gallbladder, a bile sample was aspirated and sent for culture. The cholecystostomy tube was advanced into the gallbladder lumen and formed. It is now draining bile. This tube will  be left to gravity drainage.  IMPRESSION: Percutaneous cholecystostomy with placement of 10-French drainage catheter into the gallbladder lumen. This was left to gravity drainage.   Electronically Signed   By: Aletta Edouard M.D.   On: 01/07/2015 13:53    Anti-infectives: Anti-infectives    Start     Dose/Rate Route Frequency Ordered Stop   01/06/15 1800  piperacillin-tazobactam (ZOSYN) IVPB 3.375 g     3.375 g 12.5 mL/hr over 240 Minutes Intravenous Every 8 hours 01/06/15 1732     01/06/15 1000  levofloxacin (LEVAQUIN) tablet 500 mg  Status:  Discontinued     500 mg Oral Daily 01/06/15 0907 01/06/15 1715   01/04/15 2359  vancomycin (VANCOCIN) IVPB 1000 mg/200  mL premix  Status:  Discontinued     1,000 mg 200 mL/hr over 60 Minutes Intravenous Every 12 hours 01/04/15 1659 01/06/15 0907   01/03/15 0400  vancomycin (VANCOCIN) IVPB 750 mg/150 ml premix  Status:  Discontinued     750 mg 150 mL/hr over 60 Minutes Intravenous Every 12 hours 01/02/15 1738 01/04/15 1659   01/02/15 2200  ceFEPIme (MAXIPIME) 1 g in dextrose 5 % 50 mL IVPB  Status:  Discontinued     1 g 100 mL/hr over 30 Minutes Intravenous 3 times per day 01/02/15 1457 01/06/15 0907   01/02/15 1500  vancomycin (VANCOCIN) 2,000 mg in sodium chloride 0.9 % 500 mL IVPB     2,000 mg 250 mL/hr over 120 Minutes Intravenous  Once 01/02/15 1453 01/02/15 1744   01/02/15 1500  ceFEPIme (MAXIPIME) 2 g in dextrose 5 % 50 mL IVPB     2 g 100 mL/hr over 30 Minutes Intravenous  Once 01/02/15 1457 01/02/15 1655       Assessment/Plan  1. Acute cholecystitis -s/p perc drain -HH ordered for drain flushes at home -follow up with Dr. Johney Maine in 3-4 weeks  -7 days total of an abx is all that he needs -patient surgically stable for dc home today from our standpoint.   LOS: 6 days    Amontae Ng E 01/08/2015, 8:55 AM Pager: [phone]

## 2015-01-08 NOTE — Progress Notes (Signed)
Biliary drain flushed with 10 mLs at this time. 100 mL brown liquid emptied.

## 2015-01-08 NOTE — Progress Notes (Signed)
Clinical Social Work  CSW faxed DC summary to [facility] who is agreeable to accept patient today. CSW prepared DC packet with FL2, DC summary, instructions for drain, and hard scripts included. CSW informed patient of DC plans and patient asked for CSW to update his dtr. Patient and dtr happy that patient is going to [facility] so that he can be near his sister who is a LT resident here. Patient agreeable to [facility] for transportation and is aware of no guarantee of payment but reports he is in too much pain to get in and out of a car. RN to call report to SNF. [facility] arranged, request #: [id number].  CSW is signing off but available if needed.  [place], [place] [phone]

## 2015-01-08 NOTE — Progress Notes (Signed)
Patient ID: MCKINNLEY SMITHEY, male   DOB: 1941/07/20, 74 y.o.   MRN: [id number]    Referring Physician(s): CCS  Subjective:  Pt awaiting discharge today; states he feels better; a little sore at drain site as expected  Allergies: Ketorolac tromethamine; Codeine; Morphine and related; Tramadol; and Tussionex pennkinetic er  Medications: Prior to Admission medications   Medication Sig Start Date End Date Taking? Authorizing Provider  albuterol (PROVENTIL HFA;VENTOLIN HFA) 108 (90 BASE) MCG/ACT inhaler Inhale 2 puffs into the lungs every 6 (six) hours as needed for wheezing or shortness of breath.   Yes Historical Provider, MD  albuterol (PROVENTIL) (2.5 MG/3ML) 0.083% nebulizer solution Take 3 mLs (2.5 mg total) by nebulization every 6 (six) hours as needed for wheezing. 04/14/14  Yes Collene Gobble, MD  amitriptyline (ELAVIL) 10 MG tablet Take 10 mg by mouth at bedtime as needed. 12/09/14  Yes Historical Provider, MD  dexamethasone (DECADRON) 4 MG tablet Take 1 tablet by mouth twice a day, the day before, the day of and the day after chemotherapy. Take with food 05/19/14  Yes Adrena E Johnson, PA-C  escitalopram (LEXAPRO) 20 MG tablet Take 20 mg by mouth daily.  08/16/14  Yes Historical Provider, MD  flurazepam (DALMANE) 30 MG capsule Take 30 mg by mouth at bedtime as needed for sleep.   Yes Historical Provider, MD  folic acid (FOLVITE) 1 MG tablet Take 1 tablet (1 mg total) by mouth daily. 11/19/14  Yes Adrena E Johnson, PA-C  LUMIGAN 0.01 % SOLN Place 1 drop into both eyes at bedtime.  12/10/14  Yes Historical Provider, MD  naproxen (NAPROSYN) 250 MG tablet Take 1 tablet by mouth 3 (three) times daily as needed (arthritis).  11/30/14  Yes Historical Provider, MD  nebivolol (BYSTOLIC) 10 MG tablet Take 10 mg by mouth 2 (two) times daily.    Yes Historical Provider, MD  omeprazole (PRILOSEC) 20 MG capsule Take 20 mg by mouth 2 (two) times daily before a meal.    Yes Historical Provider, MD    PRESCRIPTION MEDICATION Chemotherapy at [facility] (Dr. Julien Nordmann).  Last dose of  Alimta 12/29/2014   Yes Historical Provider, MD  Tamsulosin HCl (FLOMAX) 0.4 MG CAPS Take 0.4 mg by mouth every morning.    Yes Historical Provider, MD  alum & mag hydroxide-simeth (MAALOX/MYLANTA) 200-200-20 MG/5ML suspension Take 30 mLs by mouth every 6 (six) hours as needed for indigestion or heartburn (or bloating). 01/08/15   Maryann Mikhail, DO  amoxicillin-clavulanate (AUGMENTIN) 875-125 MG per tablet Take 1 tablet by mouth 2 (two) times daily. 01/08/15   Maryann Mikhail, DO  fluticasone (FLONASE) 50 MCG/ACT nasal spray Place 1 spray into both nostrils daily. 01/08/15   Maryann Mikhail, DO  ondansetron (ZOFRAN) 8 MG tablet Take 1 tablet (8 mg total) by mouth every 8 (eight) hours as needed for nausea or vomiting. Patient not taking: Reported on 12/29/2014 12/08/14   Carlton Adam, PA-C  oxyCODONE (ROXICODONE) 15 MG immediate release tablet Take 1 tablet (15 mg total) by mouth every 4 (four) hours as needed for pain. 01/08/15   Maryann Mikhail, DO  prochlorperazine (COMPAZINE) 10 MG tablet Take 1 tablet (10 mg total) by mouth every 6 (six) hours as needed for nausea or vomiting. Patient not taking: Reported on 12/29/2014 12/08/14   Carlton Adam, PA-C     Vital Signs: BP 125/60 mmHg  Pulse 64  Temp(Src) 98.8 F (37.1 C) (Oral)  Resp 16  Ht 6\' 2"  (1.88 m)  Wt 228 lb (103.42 kg)  BMI 29.26 kg/m2  SpO2 98%  Physical Exam pt awake/alert; GB drain intact, abd soft,  insertion site ok, mildly tender, output 200 cc's bile; cx's pend  Imaging: Dg Chest 2 View  01/06/2015   CLINICAL DATA:  Patient being treated for pneumonia. History of hypertension.  EXAM: CHEST  2 VIEW  COMPARISON:  Chest radiograph 01/02/2015.  FINDINGS: Multiple monitoring leads overlie the patient. Stable cardiomegaly. Interval increase in left lower lobe heterogeneous opacities likely secondary to known left lower lobe pulmonary mass as well as  adjacent pulmonary consolidation and left pleural effusion. Minimal right basilar atelectasis. Degenerative changes mid thoracic spine. Large hiatal hernia.  IMPRESSION: Increased heterogeneous opacities left lung base likely secondary to known left lower lobe pulmonary mass, associated peripheral heterogeneous opacities which may represent postobstructive atelectasis or pneumonia. Additionally there is a small left pleural effusion.   Electronically Signed   By: Lovey Newcomer M.D.   On: 01/06/2015 09:49   Ct Abdomen W Contrast  01/06/2015   CLINICAL DATA:  Epigastric pain and nausea for 3 days.  EXAM: CT ABDOMEN WITH CONTRAST  TECHNIQUE: Multidetector CT imaging of the abdomen was performed using the standard protocol following bolus administration of intravenous contrast.  CONTRAST:  176mL OMNIPAQUE IOHEXOL 300 MG/ML  SOLN  COMPARISON:  PET-CT scan 04/24/2014  FINDINGS: Lower chest: There are new bilateral small effusions left great than right. Mild atelectasis at the left lung base. No pericardial fluid. Large hiatal hernia noted posterior to right atrium.  Hepatobiliary: No focal hepatic lesion.  No biliary duct dilatation.  The gallbladder is distended to 49 mm. The gallbladder wall is mildly thickened. There is small amount of pericholecystic fluid. There is inflammatory stranding within the fat adjacent to the gallbladder (image 36 of series 10). There is inflation inflammation in the porta hepatis. The common bile duct appears normal caliber. There are no radiodense gallstones evident.  Pancreas: The pancreas is atrophic. There is no pancreatic duct dilatation or significant inflammation.  Spleen: Normal spleen.  Normal splenic vein.  Adrenals/urinary tract: Adrenal glands and kidneys are normal.  Stomach/Bowel: There is mild inflammation along the second portion duodenum. No bowel obstruction. Limited view of the small bowel and colon are unremarkable. The entirety of the bowel is not imaged.   Vascular/Lymphatic: Abdominal aorta is normal caliber without focal calcifications. No retroperitoneal periportal lymphadenopathy.  Musculoskeletal: No aggressive osseous lesion.  Other:  IMPRESSION: 1. Distended gallbladder with gallbladder wall thickening and pericholecystic fluid is most consistent with acute cholecystitis. 2. No biliary duct dilatation. 3. Pancreas appears normal. 4. Bilateral small effusions. 5. Large hiatal hernia. Findings conveyed toFloor nurse Davidon 01/06/2015  at17:06.   Electronically Signed   By: Suzy Bouchard M.D.   On: 01/06/2015 17:06   Ir Perc Cholecystostomy  01/07/2015   CLINICAL DATA:  Acute calculus cholecystitis. The patient is not a candidate for immediate cholecystectomy and requires placement of a percutaneous cholecystostomy tube.  EXAM: PERCUTANEOUS CHOLECYSTOSTOMY  COMPARISON:  CT of the abdomen on 01/06/2015  ANESTHESIA/SEDATION: 2.0 mg IV Versed; 100 mcg IV Fentanyl.  Total Moderate Sedation Time  17 minutes  CONTRAST:  65mL OMNIPAQUE IOHEXOL 300 MG/ML  SOLN  MEDICATIONS: A schedule dose of 3.375 g IV Zosyn was infusing during the procedure.  FLUOROSCOPY TIME:  42 seconds.  PROCEDURE: The procedure, risks, benefits, and alternatives were explained to the patient. Questions regarding the procedure were encouraged and answered. The patient understands and consents to the  procedure. A timeout was performed prior to the procedure.  The right abdominal wall was prepped with Betadine in a sterile fashion, and a sterile drape was applied covering the operative field. A sterile gown and sterile gloves were used for the procedure. Local anesthesia was provided with 1% Lidocaine.  Ultrasound was utilized to localize the gallbladder. Under direct ultrasound guidance, a 21 gauge needle was advanced via a transhepatic approach into the gallbladder lumen. Ultrasound image documentation was performed. Aspiration was performed and a bile sample sent for culture studies. A small  amount of diluted contrast material was injected. A guide wire was then advanced into the gallbladder. A transitional dilator was placed.  Percutaneous tract dilatation was then performed over a guide wire to 10-French. A 10-French pigtail drainage catheter was then advanced into the gallbladder lumen under fluoroscopy. Catheter was formed and injected with contrast material to confirm position. The catheter was flushed and connected to a gravity drainage bag. It was secured at the skin with a Prolene retention suture and Stat-Lock device.  COMPLICATIONS: None  FINDINGS: After needle puncture of the gallbladder, a bile sample was aspirated and sent for culture. The cholecystostomy tube was advanced into the gallbladder lumen and formed. It is now draining bile. This tube will be left to gravity drainage.  IMPRESSION: Percutaneous cholecystostomy with placement of 10-French drainage catheter into the gallbladder lumen. This was left to gravity drainage.   Electronically Signed   By: Aletta Edouard M.D.   On: 01/07/2015 13:53    Labs:  CBC:  Recent Labs  01/05/15 0540 01/06/15 0306 01/07/15 0510 01/08/15 0518  WBC 2.6* 5.1 10.3 9.1  HGB 9.6* 9.8* 9.6* 9.0*  HCT 28.2* 29.1* 27.8* 27.0*  PLT 100* 89* 85* 98*    COAGS:  Recent Labs  01/04/15 0520 01/07/15 0510  INR 1.08 1.22  APTT  --  33    BMP:  Recent Labs  01/04/15 0520 01/05/15 0540 01/06/15 0306 01/08/15 0518  NA 134* 137 135 138  K 3.5 3.4* 3.6 3.3*  CL 97 101 101 95*  CO2 30 31 30  35*  GLUCOSE 127* 117* 165* 118*  BUN 13 11 12 12   CALCIUM 8.6 8.7 8.5 8.8  CREATININE 0.85 0.83 0.78 0.83  GFRNONAA 85* 86* 88* 86*  GFRAA >90 >90 >90 >90    LIVER FUNCTION TESTS:  Recent Labs  01/03/15 0523 01/04/15 0520 01/05/15 0540 01/06/15 0306  BILITOT 0.9 0.8 0.8 1.0  AST 21 22 23 26   ALT 19 18 16 17   ALKPHOS 62 65 63 74  PROT 6.3 6.2 6.2 6.7  ALBUMIN 2.9* 3.0* 3.0* 3.2*    Assessment and Plan:  S/p perc  cholecystostomy 3/9 for cholecystitis; WBC/renal fxn nl; hgb stable; check final bile cx's; irrigate drain daily as OP; record output , change dressing every 2-3 days; f/u with CCS as directed  Signed: ALLRED,D KEVIN 01/08/2015, 10:46 AM   I spent a total of 15 minutes in face to face in clinical consultation/evaluation, greater than 50% of which was counseling/coordinating care for perc cholecystostomy drain

## 2015-01-08 NOTE — Progress Notes (Signed)
Occupational Therapy Treatment Patient Details Name: ANTAVION BARTOSZEK MRN: [id number] DOB: Apr 14, 1941 Today's Date: 01/08/2015    History of present illness 74 year old male with a history of COPD, hypertension, non-small cell lung carcinoma diagnosed in June 2015 (Dr. Julien Nordmann), depression presents with 3 day history of worsening shortness of breath. At baseline, the patient uses 2 L nasal cannula when necessary activity and shortness of breath at home   OT comments  Pain an issue this day  RN aware  Follow Up Recommendations  SNF    Equipment Recommendations  None recommended by OT    Recommendations for Other Services      Precautions / Restrictions Precautions Precautions: Fall Precaution Comments: oxygen dependent at home per pt       Mobility Bed Mobility   Bed Mobility: Supine to Sit     Supine to sit: Min assist Sit to supine: Min assist   General bed mobility comments: increased time and VC due to pain  Transfers Overall transfer level: Needs assistance Equipment used: 1 person hand held assist Transfers: Sit to/from Stand           General transfer comment: could not fully stand due to pain    Balance                                   ADL Overall ADL's : Needs assistance/impaired     Grooming: Wash/dry face;Sitting                                 General ADL Comments: pt transitioned to EOB and hollared in pain.  RN aware and will bring pain meds.  Pt unable to get to chair due to pain. Increased timed needed to get to EOB and back to bed                Cognition   Behavior During Therapy: [facility] for tasks assessed/performed Overall Cognitive Status: Within Functional Limits for tasks assessed                                    Pertinent Vitals/ Pain       Pain Score: 8  Pain Location: abdomen Pain Descriptors / Indicators: Sore;Shooting Pain Intervention(s): Limited activity within patient's  tolerance;Repositioned;Patient requesting pain meds-RN notified  Home Living                                              Frequency       Progress Toward Goals  OT Goals(current goals can now be found in the care plan section)  Progress towards OT goals: Progressing toward goals     Plan Discharge plan remains appropriate    Co-evaluation                 End of Session     Activity Tolerance Patient limited by pain   Patient Left in bed   Nurse Communication Mobility status        Time: 0737-1062 OT Time Calculation (min): 25 min  Charges: OT General Charges $OT Visit: 1 Procedure OT Treatments $Self Care/Home Management : 23-37 mins  Alanee Ting, Thereasa Parkin  01/08/2015, 10:37 AM

## 2015-01-08 NOTE — Discharge Summary (Signed)
Physician Discharge Summary  Tyler [name] UUV:[id number] DOB: 07-14-1941 DOA: 01/02/2015  PCP: Tamsen Roers, MD  Admit date: 01/02/2015 Discharge date: 01/08/2015  Time spent: 45 minutes  Recommendations for Outpatient Follow-up:  [facility] He should continue physical as well as occupational therapy is recommended by the facility. Patient will need a follow-up with his primary care doctor within 1 week of discharge.  Patient will also need follow-up with general surgery, Dr. gross, within 3-4 weeks of discharge. Patient's continue his medications as prescribed. Patient should continue a heart healthy diet.  Discharge Diagnoses:  Acute cholecystitis Possible postobstructive pneumonia Metastatic non-small cell lung cancer Depression BPH status post TURP Asymptomatic bacteriuria Chemotherapy-induced neutropenia Elevated BNP  Discharge Condition: Stable  Diet recommendation: Heart healthy  Filed Weights   01/02/15 1048  Weight: 103.42 kg (228 lb)    History of present illness:  on 01/02/2015 by Dr. Shanon Brow Tat 74 year old male with a history of COPD, hypertension, non-small cell lung carcinoma diagnosed in June 2015 (Dr. Julien Nordmann), depression presents with 3 day history of worsening shortness of breath. At baseline, the patient uses 2 L nasal cannula when necessary activity and shortness of breath at home. The patient denies any hemoptysis but complains of a cough with white sputum. He denies any fevers, chills, hemoptysis, nausea, vomiting, diarrhea. There's been no orthopnea or PND. The patient last received chemotherapy proximally 4 days prior to this admission. He denies any abdominal pain, dysuria, hematochezia, melena. In the emergency department, hemoglobin was 10.5, WBC 5.1, sodium 132. Otherwise BMP was unremarkable. EKG shows sinus rhythm without any ST-T wave changes. CT abdomen and chest was negative for pulmonary embolus but showed his  usual left lower lobe lung mass. There was increased consolidation and collapse in the left lower lobe. There was also multifocal patchy opacities.  Hospital Course:  Acute cholecystitis -CT of the abdomen: distended gallbladder, consistent acute cholecystitis -Gen. surgery consulted and appreciated, recommended IR for percutaneous drainage -IR consulted and appreciated, s/p percutaneous drain -Patient was placed on zosyn.  Will discharge with Augmentin (total of 7 days of antibiotics) -Continue pain control -Patient will need to follow up with Dr. Johney Maine within 3-4 weeks -Patient will need to have the drain flushed  Possible postobstructive pneumonia -Patient initially placed on vancomycin and cefepime, then placed on Levaquin- but discontinued 3/8 -Strep and legionella urine antigens negative  Metastatic non-small cell lung cancer -Dr. Julien Nordmann, oncology made aware of patient's admission  Depression -Restart her Lexapro and Restoril -Elavil held  BPH status post TURP -Continue Flomax  Asymptomatic bacteriuria -Urine culture negative  Chemotherapy-induced neutropenia -Secondary to Alimta -Previous hospitalist had discussion with Dr. Julien Nordmann, Granix held  Elevated BNP -Echocardiogram EF 55-60%  Procedures  Percutaneous cholecystectomy  Consults  Interventional radiology General surgery  Discharge Exam: Filed Vitals:   01/08/15 0558  BP: 125/60  Pulse: 64  Temp: 98.8 F (37.1 C)  Resp: 16   Exam  General: Well developed, well nourished  HEENT: NCAT, mucous membranes moist.   Cardiovascular: S1 S2 auscultated, RRR  Respiratory: Diminished but clear breath sounds  Abdomen: Soft, RUQ TTP-improved, nondistended, + bowel sounds, drain in place-bilious output  Extremities: warm dry without cyanosis clubbing or edema  Discharge Instructions      Discharge Instructions    Discharge instructions    Complete by:  As directed   [facility]. He should continue physical as well as occupational therapy is recommended  by the facility. Patient will need a follow-up with his primary care doctor within 1 week of discharge.  Patient will also need follow-up with general surgery, Dr. gross, within 3-4 weeks of discharge. Patient's continue his medications as prescribed. Patient should continue a heart healthy diet.            Medication List    STOP taking these medications        dexamethasone 4 MG tablet  Commonly known as:  DECADRON     naproxen 250 MG tablet  Commonly known as:  NAPROSYN      TAKE these medications        albuterol 108 (90 BASE) MCG/ACT inhaler  Commonly known as:  PROVENTIL HFA;VENTOLIN HFA  Inhale 2 puffs into the lungs every 6 (six) hours as needed for wheezing or shortness of breath.     albuterol (2.5 MG/3ML) 0.083% nebulizer solution  Commonly known as:  PROVENTIL  Take 3 mLs (2.5 mg total) by nebulization every 6 (six) hours as needed for wheezing.     alum & mag hydroxide-simeth 200-200-20 MG/5ML suspension  Commonly known as:  MAALOX/MYLANTA  Take 30 mLs by mouth every 6 (six) hours as needed for indigestion or heartburn (or bloating).     amitriptyline 10 MG tablet  Commonly known as:  ELAVIL  Take 10 mg by mouth at bedtime as needed.     amoxicillin-clavulanate 875-125 MG per tablet  Commonly known as:  AUGMENTIN  Take 1 tablet by mouth 2 (two) times daily.     escitalopram 20 MG tablet  Commonly known as:  LEXAPRO  Take 20 mg by mouth daily.     FLOMAX 0.4 MG Caps capsule  Generic drug:  tamsulosin  Take 0.4 mg by mouth every morning.     flurazepam 30 MG capsule  Commonly known as:  DALMANE  Take 30 mg by mouth at bedtime as needed for sleep.     fluticasone 50 MCG/ACT nasal spray  Commonly known as:  FLONASE  Place 1 spray into both nostrils daily.     folic acid 1 MG tablet  Commonly known as:  FOLVITE  Take 1 tablet (1 mg total) by mouth  daily.     LUMIGAN 0.01 % Soln  Generic drug:  bimatoprost  Place 1 drop into both eyes at bedtime.     nebivolol 10 MG tablet  Commonly known as:  BYSTOLIC  Take 10 mg by mouth 2 (two) times daily.     omeprazole 20 MG capsule  Commonly known as:  PRILOSEC  Take 20 mg by mouth 2 (two) times daily before a meal.     ondansetron 8 MG tablet  Commonly known as:  ZOFRAN  Take 1 tablet (8 mg total) by mouth every 8 (eight) hours as needed for nausea or vomiting.     oxyCODONE 15 MG immediate release tablet  Commonly known as:  ROXICODONE  Take 1 tablet (15 mg total) by mouth every 4 (four) hours as needed for pain.     PRESCRIPTION MEDICATION  Chemotherapy at [facility] (Dr. Julien Nordmann).  Last dose of  Alimta 12/29/2014     prochlorperazine 10 MG tablet  Commonly known as:  COMPAZINE  Take 1 tablet (10 mg total) by mouth every 6 (six) hours as needed for nausea or vomiting.       Allergies  Allergen Reactions  . Ketorolac Tromethamine Anaphylaxis    REACTION TO Tordal  . Codeine Itching  . Morphine  And Related     Broke him out in a rash on abdomen  . Tramadol Rash  . Tussionex Pennkinetic Er [Hydrocod Polst-Cpm Polst Er] Rash   Follow-up Information    Follow up with GROSS,STEVEN C., MD. Schedule an appointment as soon as possible for a visit in 3 weeks.   Specialty:  General Surgery   Why:  for evaluation of gallbladder drain   Contact information:   [address] [place] [address] [phone]       Follow up with Tamsen Roers, MD. Schedule an appointment as soon as possible for a visit in 1 week.   Specialty:  Family Medicine   Why:  Hospital follow-up   Contact information:   [address] [place] [address] [phone]       Follow up with Eilleen Kempf., MD.   Specialty:  Oncology   Contact information:   [place] [address] [phone]        The results of significant diagnostics from this hospitalization  (including imaging, microbiology, ancillary and laboratory) are listed below for reference.    Significant Diagnostic Studies: Dg Chest 2 View  01/06/2015   CLINICAL DATA:  Patient being treated for pneumonia. History of hypertension.  EXAM: CHEST  2 VIEW  COMPARISON:  Chest radiograph 01/02/2015.  FINDINGS: Multiple monitoring leads overlie the patient. Stable cardiomegaly. Interval increase in left lower lobe heterogeneous opacities likely secondary to known left lower lobe pulmonary mass as well as adjacent pulmonary consolidation and left pleural effusion. Minimal right basilar atelectasis. Degenerative changes mid thoracic spine. Large hiatal hernia.  IMPRESSION: Increased heterogeneous opacities left lung base likely secondary to known left lower lobe pulmonary mass, associated peripheral heterogeneous opacities which may represent postobstructive atelectasis or pneumonia. Additionally there is a small left pleural effusion.   Electronically Signed   By: Lovey Newcomer M.D.   On: 01/06/2015 09:49   Dg Chest 2 View  01/02/2015   CLINICAL DATA:  Shortness of breath.  EXAM: CHEST  2 VIEW  COMPARISON:  October 19, 2014.  FINDINGS: Stable cardiomediastinal silhouette. No pneumothorax or significant pleural effusion is noted. Right lung is clear. Mild left basilar opacity is noted most consistent with subsegmental atelectasis. Bony thorax is intact.  IMPRESSION: Mild left basilar subsegmental atelectasis.   Electronically Signed   By: Marijo Conception, M.D.   On: 01/02/2015 12:51   Ct Chest W Contrast  12/26/2014   CLINICAL DATA:  Stage III non-small-cell lung cancer. Chemotherapy in progress. Chest pain and shortness of breath.  EXAM: CT CHEST WITH CONTRAST  TECHNIQUE: Multidetector CT imaging of the chest was performed during intravenous contrast administration.  CONTRAST:  18mL OMNIPAQUE IOHEXOL 300 MG/ML  SOLN  COMPARISON:  CTA chest 10/19/14.  FINDINGS: No appreciable change in the spiculated LEFT lower  lobe mass as measured on comparable image 29 series 3 to the previous image 49 series 7 radiology different. Moderate LEFT pleural effusion, not significantly increased. Moderate fibrotic change along the LEFT hemithorax, also stable. Mild peribronchial thickening LEFT lower lobe not significantly worse.  No significant enlargement of the previously identified hilar and mediastinal nodes, up to 10 mm short axis LEFT paratracheal AP window.  Normal heart size. No pericardial effusion. Coronary artery calcification is present. Moderately large hiatal hernia projects into the retrocardiac region, stable. Moderate atheromatous change of the transverse descending aorta, without progression.  Unremarkable appearing upper abdominal structures.  Mild emphysematous change in the  lung bases particularly is stable. No pulmonary nodules.  IMPRESSION: Stable LEFT lower lobe mass corresponding to the known lung cancer. No progression of disease.   Electronically Signed   By: Rolla Flatten M.D.   On: 12/26/2014 09:25   Ct Angio Chest Pe W/cm &/or Wo Cm  01/02/2015   CLINICAL DATA:  Short of breath. Onset of symptoms 3-4 days after last chemotherapy. LEFT lower lobe cancer. Chronic back pain.  EXAM: CT ANGIOGRAPHY CHEST WITH CONTRAST  TECHNIQUE: Multidetector CT imaging of the chest was performed using the standard protocol during bolus administration of intravenous contrast. Multiplanar CT image reconstructions and MIPs were obtained to evaluate the vascular anatomy.  CONTRAST:  19mL OMNIPAQUE IOHEXOL 350 MG/ML SOLN  COMPARISON:  10/19/2014.  12/26/2014.  FINDINGS: Bones: No aggressive osseous lesions. Lower cervical spondylosis. No thoracic spine compression fractures.  Cardiovascular: No acute aortic abnormality. Aortic arch atherosclerosis. Coronary artery atherosclerosis is present. If office based assessment of coronary risk factors has not been performed, it is now recommended.  No pulmonary embolus is present. There is  extrinsic mass effect on the LEFT lower lobe pulmonary arteries associated with LEFT hilar and LEFT lower lobe mass. This nearly occludes the LEFT lower lobe pulmonary artery. No convincing evidence of acute pulmonary embolus or bland thrombus. The narrowing of the vessel appears extrinsic rather than from filling defects.  Comparing the heart size to the recent prior exam, the heart appears more dilated on today's study with LEFT atrial enlargement.  Lungs:  LEFT lower lobe mass is present and has enlarged slightly compared to the prior exam. When measured on soft tissue windows, this measures 36 mm x 26 mm at a similar location to the prior exam. Soft tissue density extends into the LEFT hilum.  Distal to the mass, there is increasing collapse/consolidation of the superior segment of the LEFT lower lobe that is most compatible with postobstructive pneumonia.  Severe emphysema. There is multifocal multilobar patchy airspace opacity. Notably, this is new compared to the recent chest CT. Interlobular septal thickening is present at the apices. The differential considerations are pulmonary edema versus infection. Aspiration is considered less likely. This is more prominent in the lingula and LEFT lower lobe.  Increasing collapse/consolidation in the basal LEFT lower lobe, some of which is probably due to lower volumes and effusion.  Central airways: Trachea patent. Extrinsic mass effect on the LEFT lung bronchi.  Effusions: Small LEFT-greater-than-RIGHT bilateral dependently layering pleural effusions. Small amount of posterior pericardial fluid or thickening is increased compared to prior.  Lymphadenopathy: No axillary adenopathy. Small prevascular nodes. Low RIGHT peritracheal old borderline adenopathy. This appears larger than prior. LEFT hilar mass and adenopathy associated with LEFT lower lobe tumor again noted.  Esophagus: Moderate hiatal hernia.  Esophagus appears normal.  Upper abdomen: Adrenal glands within  normal limits. Old granulomatous disease in the liver.  Other: None.  Review of the MIP images confirms the above findings.  IMPRESSION: 1. Negative for pulmonary embolus. LEFT lower lobe superior segment pulmonary mass extending to the LEFT hilum today measuring 36 mm x 26 mm, larger than on the most recent comparison. Extrinsic compression of the LEFT lower lobe airways and pulmonary vessels. 2. Constellation of findings compatible with mild CHF. Interstitial and mild alveolar or pulmonary edema is favored over multifocal bronchopneumonia. 3. Underlying emphysema and coronary artery disease. 4. Increasing consolidation of the LEFT lower lobe distal to the mass, suggesting postobstructive pneumonia.   Electronically Signed   By: Cay Schillings  Lamke M.D.   On: 01/02/2015 14:38   Ct Abdomen W Contrast  01/06/2015   CLINICAL DATA:  Epigastric pain and nausea for 3 days.  EXAM: CT ABDOMEN WITH CONTRAST  TECHNIQUE: Multidetector CT imaging of the abdomen was performed using the standard protocol following bolus administration of intravenous contrast.  CONTRAST:  121mL OMNIPAQUE IOHEXOL 300 MG/ML  SOLN  COMPARISON:  PET-CT scan 04/24/2014  FINDINGS: Lower chest: There are new bilateral small effusions left great than right. Mild atelectasis at the left lung base. No pericardial fluid. Large hiatal hernia noted posterior to right atrium.  Hepatobiliary: No focal hepatic lesion.  No biliary duct dilatation.  The gallbladder is distended to 49 mm. The gallbladder wall is mildly thickened. There is small amount of pericholecystic fluid. There is inflammatory stranding within the fat adjacent to the gallbladder (image 36 of series 10). There is inflation inflammation in the porta hepatis. The common bile duct appears normal caliber. There are no radiodense gallstones evident.  Pancreas: The pancreas is atrophic. There is no pancreatic duct dilatation or significant inflammation.  Spleen: Normal spleen.  Normal splenic vein.   Adrenals/urinary tract: Adrenal glands and kidneys are normal.  Stomach/Bowel: There is mild inflammation along the second portion duodenum. No bowel obstruction. Limited view of the small bowel and colon are unremarkable. The entirety of the bowel is not imaged.  Vascular/Lymphatic: Abdominal aorta is normal caliber without focal calcifications. No retroperitoneal periportal lymphadenopathy.  Musculoskeletal: No aggressive osseous lesion.  Other:  IMPRESSION: 1. Distended gallbladder with gallbladder wall thickening and pericholecystic fluid is most consistent with acute cholecystitis. 2. No biliary duct dilatation. 3. Pancreas appears normal. 4. Bilateral small effusions. 5. Large hiatal hernia. Findings conveyed toFloor nurse Davidon 01/06/2015  at17:06.   Electronically Signed   By: Suzy Bouchard M.D.   On: 01/06/2015 17:06   Ir Perc Cholecystostomy  01/07/2015   CLINICAL DATA:  Acute calculus cholecystitis. The patient is not a candidate for immediate cholecystectomy and requires placement of a percutaneous cholecystostomy tube.  EXAM: PERCUTANEOUS CHOLECYSTOSTOMY  COMPARISON:  CT of the abdomen on 01/06/2015  ANESTHESIA/SEDATION: 2.0 mg IV Versed; 100 mcg IV Fentanyl.  Total Moderate Sedation Time  17 minutes  CONTRAST:  103mL OMNIPAQUE IOHEXOL 300 MG/ML  SOLN  MEDICATIONS: A schedule dose of 3.375 g IV Zosyn was infusing during the procedure.  FLUOROSCOPY TIME:  42 seconds.  PROCEDURE: The procedure, risks, benefits, and alternatives were explained to the patient. Questions regarding the procedure were encouraged and answered. The patient understands and consents to the procedure. A timeout was performed prior to the procedure.  The right abdominal wall was prepped with Betadine in a sterile fashion, and a sterile drape was applied covering the operative field. A sterile gown and sterile gloves were used for the procedure. Local anesthesia was provided with 1% Lidocaine.  Ultrasound was utilized to localize  the gallbladder. Under direct ultrasound guidance, a 21 gauge needle was advanced via a transhepatic approach into the gallbladder lumen. Ultrasound image documentation was performed. Aspiration was performed and a bile sample sent for culture studies. A small amount of diluted contrast material was injected. A guide wire was then advanced into the gallbladder. A transitional dilator was placed.  Percutaneous tract dilatation was then performed over a guide wire to 10-French. A 10-French pigtail drainage catheter was then advanced into the gallbladder lumen under fluoroscopy. Catheter was formed and injected with contrast material to confirm position. The catheter was flushed and connected to a  gravity drainage bag. It was secured at the skin with a Prolene retention suture and Stat-Lock device.  COMPLICATIONS: None  FINDINGS: After needle puncture of the gallbladder, a bile sample was aspirated and sent for culture. The cholecystostomy tube was advanced into the gallbladder lumen and formed. It is now draining bile. This tube will be left to gravity drainage.  IMPRESSION: Percutaneous cholecystostomy with placement of 10-French drainage catheter into the gallbladder lumen. This was left to gravity drainage.   Electronically Signed   By: Aletta Edouard M.D.   On: 01/07/2015 13:53    Microbiology: Recent Results (from the past 240 hour(s))  Blood culture (routine x 2)     Status: None (Preliminary result)   Collection Time: 01/02/15  3:19 PM  Result Value Ref Range Status   Specimen Description BLOOD LEFT ARM  Final   Special Requests   Final    BOTTLES DRAWN AEROBIC AND ANAEROBIC 5CC BOTH BOTTLES   Culture   Final           BLOOD CULTURE RECEIVED NO GROWTH TO DATE CULTURE WILL BE HELD FOR 5 DAYS BEFORE ISSUING A FINAL NEGATIVE REPORT Performed at [facility]    Report Status PENDING  Incomplete  Blood culture (routine x 2)     Status: None (Preliminary result)   Collection Time: 01/02/15   3:20 PM  Result Value Ref Range Status   Specimen Description BLOOD LEFT HAND  Final   Special Requests BOTTLES DRAWN AEROBIC ONLY 2CC  Final   Culture   Final           BLOOD CULTURE RECEIVED NO GROWTH TO DATE CULTURE WILL BE HELD FOR 5 DAYS BEFORE ISSUING A FINAL NEGATIVE REPORT Performed at [facility]    Report Status PENDING  Incomplete  Culture, Urine     Status: None   Collection Time: 01/04/15  6:03 PM  Result Value Ref Range Status   Specimen Description URINE, CLEAN CATCH  Final   Special Requests NONE  Final   Colony Count NO GROWTH Performed at [facility]   Final   Culture NO GROWTH Performed at [facility]   Final   Report Status 01/05/2015 FINAL  Final     Labs: Basic Metabolic Panel:  Recent Labs Lab 01/03/15 0523 01/04/15 0520 01/05/15 0540 01/06/15 0306 01/08/15 0518  NA 137 134* 137 135 138  K 3.8 3.5 3.4* 3.6 3.3*  CL 98 97 101 101 95*  CO2 31 30 31 30  35*  GLUCOSE 132* 127* 117* 165* 118*  BUN 15 13 11 12 12   CREATININE 0.83 0.85 0.83 0.78 0.83  CALCIUM 8.8 8.6 8.7 8.5 8.8   Liver Function Tests:  Recent Labs Lab 01/03/15 0523 01/04/15 0520 01/05/15 0540 01/06/15 0306  AST 21 22 23 26   ALT 19 18 16 17   ALKPHOS 62 65 63 74  BILITOT 0.9 0.8 0.8 1.0  PROT 6.3 6.2 6.2 6.7  ALBUMIN 2.9* 3.0* 3.0* 3.2*    Recent Labs Lab 01/06/15 0930  LIPASE 12   No results for input(s): AMMONIA in the last 168 hours. CBC:  Recent Labs Lab 01/04/15 0520 01/05/15 0540 01/06/15 0306 01/07/15 0510 01/08/15 0518  WBC 1.7* 2.6* 5.1 10.3 9.1  NEUTROABS 0.5* 1.1* 3.4 7.8*  --   HGB 9.5* 9.6* 9.8* 9.6* 9.0*  HCT 27.7* 28.2* 29.1* 27.8* 27.0*  MCV 99.3 98.6 97.7 98.9 100.0  PLT 140* 100* 89* 85* 98*   Cardiac  Enzymes:  Recent Labs Lab 01/06/15 0306 01/06/15 0817 01/06/15 1425  TROPONINI <0.03 <0.03 <0.03   BNP: BNP (last 3 results)  Recent Labs  01/02/15 1730  BNP 151.1*    ProBNP (last 3  results)  Recent Labs  10/19/14 0900  PROBNP 128.3*    CBG: No results for input(s): GLUCAP in the last 168 hours.     SignedCristal Ford  [facility] 01/08/2015, 9:34 AM

## 2015-01-08 NOTE — Discharge Instructions (Signed)
Cholecystostomy  The gallbladder is a pear-shaped organ that lies beneath the liver on the right side of the body. The gallbladder stores bile, a fluid that helps the body digest fats. However, sometimes bile and other fluids build up in the gallbladder because of an obstruction (for example, a gallstones). This can cause fever, pain, swelling, nausea and other serious symptoms. The procedure used to drain these fluids is called a cholecystostomy. A tube is inserted into the gallbladder. Fluid drains through the tube into a plastic bag outside the body. This procedure is usually done on people who are admitted to the hospital. The procedure is often recommended for people who cannot have gallbladder surgery right away, usually because they are too ill to make it through surgery. The cholecystostomy tube is usually temporary, until surgery can be done. RISKS AND COMPLICATIONS Although rare, complications can include:  Clogging of the tube.  Infection in or around the drain site. Antibiotics might be prescribed for the infection. Or, another tube might be inserted to drain the infected fluid.  Internal bleeding from the liver. BEFORE THE PROCEDURE   Try to quit smoking several weeks before the procedure. Smoking can slow healing.  Arrange for someone to drive you home from the hospital.  Right before your procedure, avoid all foods and liquids after midnight. This includes coffee, tea and water.  On the day of the procedure, arrive early to fill out all the paperwork. PROCEDURE You will be given a sedative to make you sleepy and a local anesthetic to numb the skin. Next, a small cut is made in the abdomen. Then a tube is threaded through the cut into the gallbladder. The procedure is usually done with ultrasound to guide the tube into the gallbladder. Once the tube is in place, the drain is secured to the skin with a stitch. The tube is then connected to a drainage bag.  AFTER THE PROCEDURE    People who have a cholecystostomy usually stay in the hospital for several days because they are so ill. You might not be able to eat for the first few days. Instead, you will be connected to an IV for fluids and nutrients.  The procedure does not cure the blockage that caused the fluid to build up in the first place. Because of this, the gallbladder will need to be removed in the future. The drain is removed at that time. HOME CARE INSTRUCTIONS  Be sure to follow your healthcare provider's instructions carefully. You may shower but avoid tub baths and swimming until your caregiver says it is OK. Eat and drink according to the directions you have been given. And be sure to make all follow-up appointments.  Call your healthcare provider if you notice new pain, redness or swelling around the wound. SEEK IMMEDIATE MEDICAL CARE IF:   There is increased abdominal pain.  Nausea or vomiting occurs.  You develop a fever.  The drainage tube comes out of the abdomen. Document Released: 01/13/2009 Document Revised: 01/09/2012 Document Reviewed: 01/13/2009 [facility] Patient Information 2015 [place], [place]. This information is not intended to replace advice given to you by your health care provider. Make sure you discuss any questions you have with your health care provider.

## 2015-01-08 NOTE — Progress Notes (Signed)
Flushed drain with 10 ml normal saline. Patient tolerated well.

## 2015-01-11 LAB — CULTURE, ROUTINE-ABSCESS
Culture: NO GROWTH
Gram Stain: NONE SEEN
Special Requests: NORMAL

## 2015-01-12 LAB — ANAEROBIC CULTURE
GRAM STAIN: NONE SEEN
Special Requests: NORMAL

## 2015-01-19 ENCOUNTER — Telehealth: Payer: Self-pay | Admitting: Internal Medicine

## 2015-01-19 ENCOUNTER — Encounter: Payer: Self-pay | Admitting: Physician Assistant

## 2015-01-19 ENCOUNTER — Ambulatory Visit: Payer: Medicare Other

## 2015-01-19 ENCOUNTER — Ambulatory Visit (HOSPITAL_BASED_OUTPATIENT_CLINIC_OR_DEPARTMENT_OTHER): Payer: Medicare Other | Admitting: Physician Assistant

## 2015-01-19 ENCOUNTER — Other Ambulatory Visit (HOSPITAL_BASED_OUTPATIENT_CLINIC_OR_DEPARTMENT_OTHER): Payer: Medicare Other

## 2015-01-19 VITALS — BP 135/55 | HR 72 | Resp 19 | Ht 74.0 in | Wt 222.6 lb

## 2015-01-19 DIAGNOSIS — C3402 Malignant neoplasm of left main bronchus: Secondary | ICD-10-CM

## 2015-01-19 DIAGNOSIS — R591 Generalized enlarged lymph nodes: Secondary | ICD-10-CM

## 2015-01-19 LAB — COMPREHENSIVE METABOLIC PANEL (CC13)
ALT: 26 U/L (ref 0–55)
ANION GAP: 12 meq/L — AB (ref 3–11)
AST: 23 U/L (ref 5–34)
Albumin: 3.1 g/dL — ABNORMAL LOW (ref 3.5–5.0)
Alkaline Phosphatase: 100 U/L (ref 40–150)
BILIRUBIN TOTAL: 0.4 mg/dL (ref 0.20–1.20)
BUN: 11.5 mg/dL (ref 7.0–26.0)
CO2: 27 mEq/L (ref 22–29)
CREATININE: 0.9 mg/dL (ref 0.7–1.3)
Calcium: 9.6 mg/dL (ref 8.4–10.4)
Chloride: 103 mEq/L (ref 98–109)
EGFR: 85 mL/min/{1.73_m2} — AB (ref >90)
Glucose: 114 mg/dl (ref 70–140)
Potassium: 5 mEq/L (ref 3.5–5.1)
Sodium: 143 mEq/L (ref 136–145)
Total Protein: 7.5 g/dL (ref 6.4–8.3)

## 2015-01-19 LAB — CBC WITH DIFFERENTIAL/PLATELET
BASO%: 0.7 % (ref 0.0–2.0)
Basophils Absolute: 0.1 10*3/uL (ref 0.0–0.1)
EOS%: 0 % (ref 0.0–7.0)
Eosinophils Absolute: 0 10*3/uL (ref 0.0–0.5)
HCT: 31.7 % — ABNORMAL LOW (ref 38.4–49.9)
HGB: 10.3 g/dL — ABNORMAL LOW (ref 13.0–17.1)
LYMPH#: 2.5 10*3/uL (ref 0.9–3.3)
LYMPH%: 25.2 % (ref 14.0–49.0)
MCH: 32.3 pg (ref 27.2–33.4)
MCHC: 32.6 g/dL (ref 32.0–36.0)
MCV: 99.3 fL — ABNORMAL HIGH (ref 79.3–98.0)
MONO#: 0.7 10*3/uL (ref 0.1–0.9)
MONO%: 7.4 % (ref 0.0–14.0)
NEUT%: 66.7 % (ref 39.0–75.0)
NEUTROS ABS: 6.6 10*3/uL — AB (ref 1.5–6.5)
Platelets: 360 10*3/uL (ref 140–400)
RBC: 3.19 10*6/uL — ABNORMAL LOW (ref 4.20–5.82)
RDW: 15.1 % — ABNORMAL HIGH (ref 11.0–14.6)
WBC: 9.9 10*3/uL (ref 4.0–10.3)

## 2015-01-19 NOTE — Patient Instructions (Signed)
Or postponing her chemotherapy 2 weeks to allow for resolution of your percutaneous drain Follow-up in 2 weeks

## 2015-01-19 NOTE — Progress Notes (Addendum)
Tyler [name] [phone]   Fax:(336) [place], [place] [address] [place] [address]  DIAGNOSIS: Stage IIIB (T2b, N3, M0) non-small cell lung cancer, adenocarcinoma, diagnosed in June of 2015 with large left lower lobe lung mass in addition to ipsilateral and contralateral mediastinal lymphadenopathy.  PRIOR THERAPY: Systemic chemotherapy with carboplatin for AUC of 5 and Alimta 500 mg/M2 every 3 weeks but status post 6 cycles. Last dose was given 09/08/2014 with partial response.  CURRENT THERAPY: Maintenance chemotherapy with single agent Alimta 500 MG/M2 every 3 weeks.  First dose 10/27/2014. Status post 4 cycles.  INTERVAL HISTORY: SALVADORE VALVANO 74 y.o. male returns to the clinic today for followup visit accompanied by his wife. He tolerated the last cycle of his maintenance chemotherapy with single agent Alimta fairly well with no significant complaints. He status post recent hospitalization for cholecystitis and underwent a percutaneous cholecystostomy on 01/07/2015 for cholecystitis and now has a percutaneous drain which, per the patient is to be in place with proximally 6 weeks. [facility] He reports a cough that is only very occasionally productive of a scant amount of secretions that are yellowish in color, not associated with any fever or chills.  He denied having any significant chest pain, shortness of breath, cough or hemoptysis. He is currently on oxygen via nasal cannula at 2 L/m. The patient denied having any significant vomiting, no fever or chills. He is here to proceed with cycle #5 of his maintenance chemotherapy with single agent Alimta.   MEDICAL HISTORY: Past Medical History  Diagnosis Date  . Allergic rhinitis   . BPH (benign prostatic hyperplasia)   . GERD (gastroesophageal reflux disease)   . COPD (chronic obstructive pulmonary disease)   . Shortness of  breath   . Arthritis   . Enlarged prostate   . Hypertension   . Mental disorder   . Constipation   . Chronic chest pain   . Pneumonia     hx  . Lung cancer 04/11/14    lul carina lesion=Adenocarcinoma    ALLERGIES:  is allergic to ketorolac tromethamine; codeine; morphine and related; tramadol; and tussionex pennkinetic er.  MEDICATIONS:  Current Outpatient Prescriptions  Medication Sig Dispense Refill  . albuterol (PROVENTIL HFA;VENTOLIN HFA) 108 (90 BASE) MCG/ACT inhaler Inhale 2 puffs into the lungs every 6 (six) hours as needed for wheezing or shortness of breath.    Marland Kitchen albuterol (PROVENTIL) (2.5 MG/3ML) 0.083% nebulizer solution Take 3 mLs (2.5 mg total) by nebulization every 6 (six) hours as needed for wheezing. 75 mL 3  . alum & mag hydroxide-simeth (MAALOX/MYLANTA) 200-200-20 MG/5ML suspension Take 30 mLs by mouth every 6 (six) hours as needed for indigestion or heartburn (or bloating). 355 mL 0  . amitriptyline (ELAVIL) 10 MG tablet Take 10 mg by mouth at bedtime as needed.  2  . amoxicillin-clavulanate (AUGMENTIN) 875-125 MG per tablet Take 1 tablet by mouth 2 (two) times daily. 12 tablet 0  . escitalopram (LEXAPRO) 20 MG tablet Take 20 mg by mouth daily.   12  . flurazepam (DALMANE) 30 MG capsule Take 30 mg by mouth at bedtime as needed for sleep.    . fluticasone (FLONASE) 50 MCG/ACT nasal spray Place 1 spray into both nostrils daily.  2  . folic acid (FOLVITE) 1 MG tablet Take 1 tablet (1 mg total) by mouth daily. 30 tablet 1  .  latanoprost (XALATAN) 0.005 % ophthalmic solution 1 drop at bedtime.    Marland Kitchen LUMIGAN 0.01 % SOLN Place 1 drop into both eyes at bedtime.   1  . metoprolol succinate (TOPROL-XL) 100 MG 24 hr tablet Take 100 mg by mouth daily. Take with or immediately following a meal.    . nebivolol (BYSTOLIC) 10 MG tablet Take 10 mg by mouth 2 (two) times daily.     Marland Kitchen omeprazole (PRILOSEC) 20 MG capsule Take 20 mg by mouth 2 (two) times daily before a meal.     .  ondansetron (ZOFRAN) 8 MG tablet Take 1 tablet (8 mg total) by mouth every 8 (eight) hours as needed for nausea or vomiting. 20 tablet 1  . oxyCODONE (ROXICODONE) 15 MG immediate release tablet Take 1 tablet (15 mg total) by mouth every 4 (four) hours as needed for pain. 20 tablet 0  . PRESCRIPTION MEDICATION Chemotherapy at [facility] (Dr. Julien Nordmann).  Last dose of  Alimta 12/29/2014    . prochlorperazine (COMPAZINE) 10 MG tablet Take 1 tablet (10 mg total) by mouth every 6 (six) hours as needed for nausea or vomiting. 30 tablet 1  . saccharomyces boulardii (FLORASTOR) 250 MG capsule Take 250 mg by mouth 2 (two) times daily.    . Tamsulosin HCl (FLOMAX) 0.4 MG CAPS Take 0.4 mg by mouth every morning.      No current facility-administered medications for this visit.    SURGICAL HISTORY:  Past Surgical History  Procedure Laterality Date  . Ankle surgery  2005  . Prostate surgery  11/25/2010  . Hernia repair    . Mediastinoscopy  06/11/2012    Procedure: MEDIASTINOSCOPY;  Surgeon: Grace Isaac, MD;  Location: [facility];  Service: Thoracic;  Laterality: N/A;  . Inguinal hernia repair      left  . Eye surgery      cataract right  . Video bronchoscopy with endobronchial ultrasound  10/08/2012    Procedure: VIDEO BRONCHOSCOPY WITH ENDOBRONCHIAL ULTRASOUND;  Surgeon: Collene Gobble, MD;  Location: [facility];  Service: Pulmonary;  Laterality: N/A;  . Video bronchoscopy Bilateral 04/11/2014    Procedure: VIDEO BRONCHOSCOPY WITHOUT FLUORO;  Surgeon: Collene Gobble, MD;  Location: [facility];  Service: Cardiopulmonary;  Laterality: Bilateral;    REVIEW OF SYSTEMS:  Constitutional: positive for fatigue Eyes: negative Ears, nose, mouth, throat, and face: negative Respiratory: positive for cough and dyspnea on exertion Cardiovascular: negative Gastrointestinal: positive for Status post percutaneous cholecystostomy with percutaneous drain in place Genitourinary:negative Integument/breast:  negative Hematologic/lymphatic: negative Musculoskeletal:positive for back pain Neurological: negative Behavioral/Psych: negative Endocrine: negative Allergic/Immunologic: negative   PHYSICAL EXAMINATION: General appearance: alert, cooperative, no distress and On oxygen at 2 L via nasal cannula Head: Normocephalic, without obvious abnormality, atraumatic Neck: no adenopathy, no JVD, supple, symmetrical, trachea midline and thyroid not enlarged, symmetric, no tenderness/mass/nodules Lymph nodes: Cervical, supraclavicular, and axillary nodes normal. Resp: wheezes bilaterally and minimal Back: symmetric, no curvature. ROM normal. No CVA tenderness. Cardio: regular rate and rhythm, S1, S2 normal, no murmur, click, rub or gallop GI: soft, non-tender; bowel sounds normal; no masses,  no organomegaly and Percutaneous drain in place functioning properly Extremities: extremities normal, atraumatic, no cyanosis or edema Neurologic: Alert and oriented X 3, normal strength and tone. Normal symmetric reflexes. Normal coordination and gait  ECOG PERFORMANCE STATUS: 1 - Symptomatic but completely ambulatory  Blood pressure 135/55, pulse 72, resp. rate 19, height $RemoveBe'6\' 2"'GoUbyFZFm$  (1.88 m), weight 222 lb 9.6 oz (100.971 kg), SpO2 93 %.  LABORATORY DATA: Lab Results  Component Value Date   WBC 9.9 01/19/2015   HGB 10.3* 01/19/2015   HCT 31.7* 01/19/2015   MCV 99.3* 01/19/2015   PLT 360 01/19/2015      Chemistry      Component Value Date/Time   NA 143 01/19/2015 1021   NA 138 01/08/2015 0518   K 5.0 01/19/2015 1021   K 3.3* 01/08/2015 0518   CL 95* 01/08/2015 0518   CO2 27 01/19/2015 1021   CO2 35* 01/08/2015 0518   BUN 11.5 01/19/2015 1021   BUN 12 01/08/2015 0518   CREATININE 0.9 01/19/2015 1021   CREATININE 0.83 01/08/2015 0518      Component Value Date/Time   CALCIUM 9.6 01/19/2015 1021   CALCIUM 8.8 01/08/2015 0518   ALKPHOS 100 01/19/2015 1021   ALKPHOS 74 01/06/2015 0306   AST 23  01/19/2015 1021   AST 26 01/06/2015 0306   ALT 26 01/19/2015 1021   ALT 17 01/06/2015 0306   BILITOT 0.40 01/19/2015 1021   BILITOT 1.0 01/06/2015 0306       RADIOGRAPHIC STUDIES: Dg Chest 2 View  01/06/2015   CLINICAL DATA:  Patient being treated for pneumonia. History of hypertension.  EXAM: CHEST  2 VIEW  COMPARISON:  Chest radiograph 01/02/2015.  FINDINGS: Multiple monitoring leads overlie the patient. Stable cardiomegaly. Interval increase in left lower lobe heterogeneous opacities likely secondary to known left lower lobe pulmonary mass as well as adjacent pulmonary consolidation and left pleural effusion. Minimal right basilar atelectasis. Degenerative changes mid thoracic spine. Large hiatal hernia.  IMPRESSION: Increased heterogeneous opacities left lung base likely secondary to known left lower lobe pulmonary mass, associated peripheral heterogeneous opacities which may represent postobstructive atelectasis or pneumonia. Additionally there is a small left pleural effusion.   Electronically Signed   By: Lovey Newcomer M.D.   On: 01/06/2015 09:49   Dg Chest 2 View  01/02/2015   CLINICAL DATA:  Shortness of breath.  EXAM: CHEST  2 VIEW  COMPARISON:  October 19, 2014.  FINDINGS: Stable cardiomediastinal silhouette. No pneumothorax or significant pleural effusion is noted. Right lung is clear. Mild left basilar opacity is noted most consistent with subsegmental atelectasis. Bony thorax is intact.  IMPRESSION: Mild left basilar subsegmental atelectasis.   Electronically Signed   By: Marijo Conception, M.D.   On: 01/02/2015 12:51   Ct Chest W Contrast  12/26/2014   CLINICAL DATA:  Stage III non-small-cell lung cancer. Chemotherapy in progress. Chest pain and shortness of breath.  EXAM: CT CHEST WITH CONTRAST  TECHNIQUE: Multidetector CT imaging of the chest was performed during intravenous contrast administration.  CONTRAST:  22mL OMNIPAQUE IOHEXOL 300 MG/ML  SOLN  COMPARISON:  CTA chest 10/19/14.   FINDINGS: No appreciable change in the spiculated LEFT lower lobe mass as measured on comparable image 29 series 3 to the previous image 49 series 7 radiology different. Moderate LEFT pleural effusion, not significantly increased. Moderate fibrotic change along the LEFT hemithorax, also stable. Mild peribronchial thickening LEFT lower lobe not significantly worse.  No significant enlargement of the previously identified hilar and mediastinal nodes, up to 10 mm short axis LEFT paratracheal AP window.  Normal heart size. No pericardial effusion. Coronary artery calcification is present. Moderately large hiatal hernia projects into the retrocardiac region, stable. Moderate atheromatous change of the transverse descending aorta, without progression.  Unremarkable appearing upper abdominal structures.  Mild emphysematous change in the lung bases particularly is stable. No pulmonary nodules.  IMPRESSION:  Stable LEFT lower lobe mass corresponding to the known lung cancer. No progression of disease.   Electronically Signed   By: Rolla Flatten M.D.   On: 12/26/2014 09:25   Ct Angio Chest Pe W/cm &/or Wo Cm  01/02/2015   CLINICAL DATA:  Short of breath. Onset of symptoms 3-4 days after last chemotherapy. LEFT lower lobe cancer. Chronic back pain.  EXAM: CT ANGIOGRAPHY CHEST WITH CONTRAST  TECHNIQUE: Multidetector CT imaging of the chest was performed using the standard protocol during bolus administration of intravenous contrast. Multiplanar CT image reconstructions and MIPs were obtained to evaluate the vascular anatomy.  CONTRAST:  111mL OMNIPAQUE IOHEXOL 350 MG/ML SOLN  COMPARISON:  10/19/2014.  12/26/2014.  FINDINGS: Bones: No aggressive osseous lesions. Lower cervical spondylosis. No thoracic spine compression fractures.  Cardiovascular: No acute aortic abnormality. Aortic arch atherosclerosis. Coronary artery atherosclerosis is present. If office based assessment of coronary risk factors has not been performed, it is  now recommended.  No pulmonary embolus is present. There is extrinsic mass effect on the LEFT lower lobe pulmonary arteries associated with LEFT hilar and LEFT lower lobe mass. This nearly occludes the LEFT lower lobe pulmonary artery. No convincing evidence of acute pulmonary embolus or bland thrombus. The narrowing of the vessel appears extrinsic rather than from filling defects.  Comparing the heart size to the recent prior exam, the heart appears more dilated on today's study with LEFT atrial enlargement.  Lungs:  LEFT lower lobe mass is present and has enlarged slightly compared to the prior exam. When measured on soft tissue windows, this measures 36 mm x 26 mm at a similar location to the prior exam. Soft tissue density extends into the LEFT hilum.  Distal to the mass, there is increasing collapse/consolidation of the superior segment of the LEFT lower lobe that is most compatible with postobstructive pneumonia.  Severe emphysema. There is multifocal multilobar patchy airspace opacity. Notably, this is new compared to the recent chest CT. Interlobular septal thickening is present at the apices. The differential considerations are pulmonary edema versus infection. Aspiration is considered less likely. This is more prominent in the lingula and LEFT lower lobe.  Increasing collapse/consolidation in the basal LEFT lower lobe, some of which is probably due to lower volumes and effusion.  Central airways: Trachea patent. Extrinsic mass effect on the LEFT lung bronchi.  Effusions: Small LEFT-greater-than-RIGHT bilateral dependently layering pleural effusions. Small amount of posterior pericardial fluid or thickening is increased compared to prior.  Lymphadenopathy: No axillary adenopathy. Small prevascular nodes. Low RIGHT peritracheal old borderline adenopathy. This appears larger than prior. LEFT hilar mass and adenopathy associated with LEFT lower lobe tumor again noted.  Esophagus: Moderate hiatal hernia.   Esophagus appears normal.  Upper abdomen: Adrenal glands within normal limits. Old granulomatous disease in the liver.  Other: None.  Review of the MIP images confirms the above findings.  IMPRESSION: 1. Negative for pulmonary embolus. LEFT lower lobe superior segment pulmonary mass extending to the LEFT hilum today measuring 36 mm x 26 mm, larger than on the most recent comparison. Extrinsic compression of the LEFT lower lobe airways and pulmonary vessels. 2. Constellation of findings compatible with mild CHF. Interstitial and mild alveolar or pulmonary edema is favored over multifocal bronchopneumonia. 3. Underlying emphysema and coronary artery disease. 4. Increasing consolidation of the LEFT lower lobe distal to the mass, suggesting postobstructive pneumonia.   Electronically Signed   By: Dereck Ligas M.D.   On: 01/02/2015 14:38   Ct  Abdomen W Contrast  01/06/2015   CLINICAL DATA:  Epigastric pain and nausea for 3 days.  EXAM: CT ABDOMEN WITH CONTRAST  TECHNIQUE: Multidetector CT imaging of the abdomen was performed using the standard protocol following bolus administration of intravenous contrast.  CONTRAST:  17mL OMNIPAQUE IOHEXOL 300 MG/ML  SOLN  COMPARISON:  PET-CT scan 04/24/2014  FINDINGS: Lower chest: There are new bilateral small effusions left great than right. Mild atelectasis at the left lung base. No pericardial fluid. Large hiatal hernia noted posterior to right atrium.  Hepatobiliary: No focal hepatic lesion.  No biliary duct dilatation.  The gallbladder is distended to 49 mm. The gallbladder wall is mildly thickened. There is small amount of pericholecystic fluid. There is inflammatory stranding within the fat adjacent to the gallbladder (image 36 of series 10). There is inflation inflammation in the porta hepatis. The common bile duct appears normal caliber. There are no radiodense gallstones evident.  Pancreas: The pancreas is atrophic. There is no pancreatic duct dilatation or significant  inflammation.  Spleen: Normal spleen.  Normal splenic vein.  Adrenals/urinary tract: Adrenal glands and kidneys are normal.  Stomach/Bowel: There is mild inflammation along the second portion duodenum. No bowel obstruction. Limited view of the small bowel and colon are unremarkable. The entirety of the bowel is not imaged.  Vascular/Lymphatic: Abdominal aorta is normal caliber without focal calcifications. No retroperitoneal periportal lymphadenopathy.  Musculoskeletal: No aggressive osseous lesion.  Other:  IMPRESSION: 1. Distended gallbladder with gallbladder wall thickening and pericholecystic fluid is most consistent with acute cholecystitis. 2. No biliary duct dilatation. 3. Pancreas appears normal. 4. Bilateral small effusions. 5. Large hiatal hernia. Findings conveyed toFloor nurse Davidon 01/06/2015  at17:06.   Electronically Signed   By: Suzy Bouchard M.D.   On: 01/06/2015 17:06   Ir Perc Cholecystostomy  01/07/2015   CLINICAL DATA:  Acute calculus cholecystitis. The patient is not a candidate for immediate cholecystectomy and requires placement of a percutaneous cholecystostomy tube.  EXAM: PERCUTANEOUS CHOLECYSTOSTOMY  COMPARISON:  CT of the abdomen on 01/06/2015  ANESTHESIA/SEDATION: 2.0 mg IV Versed; 100 mcg IV Fentanyl.  Total Moderate Sedation Time  17 minutes  CONTRAST:  44mL OMNIPAQUE IOHEXOL 300 MG/ML  SOLN  MEDICATIONS: A schedule dose of 3.375 g IV Zosyn was infusing during the procedure.  FLUOROSCOPY TIME:  42 seconds.  PROCEDURE: The procedure, risks, benefits, and alternatives were explained to the patient. Questions regarding the procedure were encouraged and answered. The patient understands and consents to the procedure. A timeout was performed prior to the procedure.  The right abdominal wall was prepped with Betadine in a sterile fashion, and a sterile drape was applied covering the operative field. A sterile gown and sterile gloves were used for the procedure. Local anesthesia was  provided with 1% Lidocaine.  Ultrasound was utilized to localize the gallbladder. Under direct ultrasound guidance, a 21 gauge needle was advanced via a transhepatic approach into the gallbladder lumen. Ultrasound image documentation was performed. Aspiration was performed and a bile sample sent for culture studies. A small amount of diluted contrast material was injected. A guide wire was then advanced into the gallbladder. A transitional dilator was placed.  Percutaneous tract dilatation was then performed over a guide wire to 10-French. A 10-French pigtail drainage catheter was then advanced into the gallbladder lumen under fluoroscopy. Catheter was formed and injected with contrast material to confirm position. The catheter was flushed and connected to a gravity drainage bag. It was secured at the skin with  a Prolene retention suture and Stat-Lock device.  COMPLICATIONS: None  FINDINGS: After needle puncture of the gallbladder, a bile sample was aspirated and sent for culture. The cholecystostomy tube was advanced into the gallbladder lumen and formed. It is now draining bile. This tube will be left to gravity drainage.  IMPRESSION: Percutaneous cholecystostomy with placement of 10-French drainage catheter into the gallbladder lumen. This was left to gravity drainage.   Electronically Signed   By: Aletta Edouard M.D.   On: 01/07/2015 13:53   ASSESSMENT AND PLAN: This is a very pleasant 74 years old white male recently diagnosed with a stage IIIB non-small cell lung cancer, adenocarcinoma with large left lower lobe lung mass in addition to ipsilateral and contralateral mediastinal lymphadenopathy. He was not a candidate for concurrent radiotherapy because of the large volume of treatment area.  The patient underwent a course of systemic chemotherapy with carboplatin and Alimta status post 6 cycles. He tolerated his treatment fairly well and the recent CT scan of the chest showed further improvement in his  disease. He is currently undergoing maintenance chemotherapy with single agent Alimta status post 4 cycles and tolerating his treatment well. The recent CT scan of the chest showed no evidence for disease progression. Patient was discussed with and also seen by Dr. Julien Nordmann. We will postpone the start of cycle #5 of his maintenance chemotherapy with single agent Alimta by 2 weeks given his current percutaneous drain to allow further resolution of this issue. He'll follow-up in 2 weeks with repeat CBC differential, C met and hopefully proceed with cycle #5 if all is well. Patient is wife are in agreement with this plan.  He was advised to call immediately if he has any concerning symptoms in the interval.  The patient voices understanding of current disease status and treatment options and is in agreement with the current care plan.  All questions were answered. The patient knows to call the clinic with any problems, questions or concerns. We can certainly see the patient much sooner if necessary.  Carlton Adam, PA-C 01/19/2015  ADDENDUM: Hematology/Oncology Attending: I had a face to face encounter with the patient. I recommended his care plan. This is a very pleasant 74 years old white male with a stage IIIB non-small cell lung cancer status post induction chemotherapy with carboplatin and Alimta and he is currently on maintenance treatment with single agent Alimta status post 4 cycles. The patient was recently diagnosed with acute calculus cholecystitis and he underwent percutaneous cholecystectomy placement by interventional radiology. He is expected to continue with this drainage for 2 more weeks. The patient is feeling fine but still complaining of increasing fatigue. Had a lengthy discussion with the patient today about his condition. I recommended for him to delay the start of cycle #5 of his maintenance chemotherapy by 2 more weeks to give him time to recover from the recent acute calculus  cholecystitis. The patient agreed to the current plan. He would come back for follow-up visit in 2 weeks with the start of cycle #5. He was advised to call immediately if he has any concerning symptoms in the interval.  Disclaimer: This note was dictated with voice recognition software. Similar sounding words can inadvertently be transcribed and may be missed upon review. Eilleen Kempf., MD 01/19/2015

## 2015-01-19 NOTE — Telephone Encounter (Signed)
Gave avs & calendar for April.Sent message to schedule treatment

## 2015-01-26 ENCOUNTER — Other Ambulatory Visit: Payer: Self-pay

## 2015-01-26 DIAGNOSIS — C3432 Malignant neoplasm of lower lobe, left bronchus or lung: Secondary | ICD-10-CM

## 2015-01-26 MED ORDER — FOLIC ACID 1 MG PO TABS: 1.0000 mg | ORAL_TABLET | Freq: Every day | ORAL | Status: AC

## 2015-01-27 ENCOUNTER — Other Ambulatory Visit: Payer: Self-pay

## 2015-01-28 ENCOUNTER — Other Ambulatory Visit: Payer: Self-pay

## 2015-01-28 ENCOUNTER — Other Ambulatory Visit: Payer: Self-pay | Admitting: General Surgery

## 2015-01-28 DIAGNOSIS — K810 Acute cholecystitis: Secondary | ICD-10-CM

## 2015-01-29 ENCOUNTER — Ambulatory Visit (HOSPITAL_COMMUNITY)
Admission: RE | Admit: 2015-01-29 | Discharge: 2015-01-29 | Disposition: A | Discharge status: Home / Self Care | Payer: Medicare Other | Source: Ambulatory Visit | Attending: Diagnostic Radiology | Admitting: Diagnostic Radiology

## 2015-01-29 DIAGNOSIS — Y838 Other surgical procedures as the cause of abnormal reaction of the patient, or of later complication, without mention of misadventure at the time of the procedure: Secondary | ICD-10-CM | POA: Insufficient documentation

## 2015-01-29 DIAGNOSIS — T859XXA Unspecified complication of internal prosthetic device, implant and graft, initial encounter: Secondary | ICD-10-CM | POA: Insufficient documentation

## 2015-01-29 DIAGNOSIS — K810 Acute cholecystitis: Secondary | ICD-10-CM | POA: Diagnosis not present

## 2015-02-02 ENCOUNTER — Ambulatory Visit: Payer: Medicare Other

## 2015-02-02 ENCOUNTER — Ambulatory Visit (HOSPITAL_BASED_OUTPATIENT_CLINIC_OR_DEPARTMENT_OTHER): Payer: Medicare Other | Admitting: Physician Assistant

## 2015-02-02 ENCOUNTER — Telehealth: Payer: Self-pay | Admitting: Internal Medicine

## 2015-02-02 ENCOUNTER — Other Ambulatory Visit (HOSPITAL_COMMUNITY): Payer: Self-pay | Admitting: Internal Medicine

## 2015-02-02 ENCOUNTER — Other Ambulatory Visit (HOSPITAL_BASED_OUTPATIENT_CLINIC_OR_DEPARTMENT_OTHER): Payer: Medicare Other

## 2015-02-02 ENCOUNTER — Encounter: Payer: Self-pay | Admitting: Physician Assistant

## 2015-02-02 VITALS — BP 145/61 | HR 68 | Temp 98.0°F | Resp 20 | Ht 74.0 in | Wt 218.1 lb

## 2015-02-02 DIAGNOSIS — K819 Cholecystitis, unspecified: Secondary | ICD-10-CM

## 2015-02-02 DIAGNOSIS — R530 Neoplastic (malignant) related fatigue: Secondary | ICD-10-CM

## 2015-02-02 DIAGNOSIS — C3490 Malignant neoplasm of unspecified part of unspecified bronchus or lung: Secondary | ICD-10-CM

## 2015-02-02 DIAGNOSIS — C3402 Malignant neoplasm of left main bronchus: Secondary | ICD-10-CM

## 2015-02-02 DIAGNOSIS — C3432 Malignant neoplasm of lower lobe, left bronchus or lung: Secondary | ICD-10-CM | POA: Diagnosis not present

## 2015-02-02 DIAGNOSIS — C778 Secondary and unspecified malignant neoplasm of lymph nodes of multiple regions: Secondary | ICD-10-CM

## 2015-02-02 DIAGNOSIS — C3412 Malignant neoplasm of upper lobe, left bronchus or lung: Secondary | ICD-10-CM | POA: Diagnosis not present

## 2015-02-02 LAB — COMPREHENSIVE METABOLIC PANEL (CC13)
ALT: 30 U/L (ref 0–55)
AST: 26 U/L (ref 5–34)
Albumin: 3.6 g/dL (ref 3.5–5.0)
Alkaline Phosphatase: 148 U/L (ref 40–150)
Anion Gap: 14 mEq/L — ABNORMAL HIGH (ref 3–11)
BUN: 13.4 mg/dL (ref 7.0–26.0)
CO2: 24 meq/L (ref 22–29)
CREATININE: 1.1 mg/dL (ref 0.7–1.3)
Calcium: 9.7 mg/dL (ref 8.4–10.4)
Chloride: 100 mEq/L (ref 98–109)
EGFR: 65 mL/min/{1.73_m2} — ABNORMAL LOW (ref >90)
Glucose: 113 mg/dl (ref 70–140)
POTASSIUM: 4.5 meq/L (ref 3.5–5.1)
SODIUM: 137 meq/L (ref 136–145)
TOTAL PROTEIN: 7.8 g/dL (ref 6.4–8.3)
Total Bilirubin: 0.37 mg/dL (ref 0.20–1.20)

## 2015-02-02 LAB — CBC WITH DIFFERENTIAL/PLATELET
BASO%: 0.2 % (ref 0.0–2.0)
BASOS ABS: 0 10*3/uL (ref 0.0–0.1)
EOS%: 1.8 % (ref 0.0–7.0)
Eosinophils Absolute: 0.1 10*3/uL (ref 0.0–0.5)
HCT: 31.8 % — ABNORMAL LOW (ref 38.4–49.9)
HGB: 10.4 g/dL — ABNORMAL LOW (ref 13.0–17.1)
LYMPH%: 22.6 % (ref 14.0–49.0)
MCH: 31.7 pg (ref 27.2–33.4)
MCHC: 32.7 g/dL (ref 32.0–36.0)
MCV: 97 fL (ref 79.3–98.0)
MONO#: 0.7 10*3/uL (ref 0.1–0.9)
MONO%: 8.5 % (ref 0.0–14.0)
NEUT#: 5.1 10*3/uL (ref 1.5–6.5)
NEUT%: 66.9 % (ref 39.0–75.0)
PLATELETS: 278 10*3/uL (ref 140–400)
RBC: 3.28 10*6/uL — ABNORMAL LOW (ref 4.20–5.82)
RDW: 15.7 % — ABNORMAL HIGH (ref 11.0–14.6)
WBC: 7.7 10*3/uL (ref 4.0–10.3)
lymph#: 1.7 10*3/uL (ref 0.9–3.3)

## 2015-02-02 NOTE — Progress Notes (Addendum)
[facility] Telephone:(336) [phone]   Fax:(336) [place], [place] [address] [place] [address]  DIAGNOSIS: Stage IIIB (T2b, N3, M0) non-small cell lung cancer, adenocarcinoma, diagnosed in June of 2015 with large left lower lobe lung mass in addition to ipsilateral and contralateral mediastinal lymphadenopathy.  PRIOR THERAPY: Systemic chemotherapy with carboplatin for AUC of 5 and Alimta 500 mg/M2 every 3 weeks but status post 6 cycles. Last dose was given 09/08/2014 with partial response.  CURRENT THERAPY: Maintenance chemotherapy with single agent Alimta 500 MG/M2 every 3 weeks.  First dose 10/27/2014. Status post 4 cycles.  INTERVAL HISTORY: Tyler [name] 74 y.o. male returns to the clinic today for followup visit accompanied by his sister. He tolerated the last cycle of his maintenance chemotherapy with single agent Alimta fairly well with no significant complaints. He status post recent hospitalization for cholecystitis and underwent a percutaneous cholecystostomy on 01/07/2015 for cholecystitis and now has a percutaneous drain which, per the patient it is to be in place for proximally 6 weeks. He is currently at [place] [facility] facility. Today he is not feeling well, complaining of increased right upper quadrant pain. He is scheduled to see Dr. Elie Confer on 02/03/2015 for reevaluation. He is also to see interventional radiology on 02/18/2015 for reevaluation of his drain. He complains of feeling weak in addition to the right upper quadrant pain. He had something for pain before coming into the clinic today and is a little disoriented. He does not feel up to proceed with chemotherapy as scheduled today.  He denied having any significant chest pain, shortness of breath, cough or hemoptysis. He is currently on oxygen via nasal cannula at 2 L/m. The patient denied having any significant vomiting, no fever or chills.   MEDICAL  HISTORY: Past Medical History  Diagnosis Date  . Allergic rhinitis   . BPH (benign prostatic hyperplasia)   . GERD (gastroesophageal reflux disease)   . COPD (chronic obstructive pulmonary disease)   . Shortness of breath   . Arthritis   . Enlarged prostate   . Hypertension   . Mental disorder   . Constipation   . Chronic chest pain   . Pneumonia     hx  . Lung cancer 04/11/14    lul carina lesion=Adenocarcinoma    ALLERGIES:  is allergic to ketorolac tromethamine; codeine; morphine and related; tramadol; and tussionex pennkinetic er.  MEDICATIONS:  Current Outpatient Prescriptions  Medication Sig Dispense Refill  . alum & mag hydroxide-simeth (MAALOX/MYLANTA) 200-200-20 MG/5ML suspension Take 30 mLs by mouth every 6 (six) hours as needed for indigestion or heartburn (or bloating). 355 mL 0  . amitriptyline (ELAVIL) 10 MG tablet Take 10 mg by mouth at bedtime as needed.  2  . escitalopram (LEXAPRO) 20 MG tablet Take 20 mg by mouth daily.   12  . fluticasone (FLONASE) 50 MCG/ACT nasal spray Place 1 spray into both nostrils daily.  2  . folic acid (FOLVITE) 1 MG tablet Take 1 tablet (1 mg total) by mouth daily. 30 tablet 1  . latanoprost (XALATAN) 0.005 % ophthalmic solution 1 drop at bedtime.    Marland Kitchen LUMIGAN 0.01 % SOLN Place 1 drop into both eyes at bedtime.   1  . metoprolol succinate (TOPROL-XL) 100 MG 24 hr tablet Take 100 mg by mouth daily. Take with or immediately following a meal.    . nebivolol (BYSTOLIC) 10 MG tablet Take 10  mg by mouth 2 (two) times daily.     Marland Kitchen omeprazole (PRILOSEC) 20 MG capsule Take 20 mg by mouth 2 (two) times daily before a meal.     . ondansetron (ZOFRAN) 8 MG tablet Take 1 tablet (8 mg total) by mouth every 8 (eight) hours as needed for nausea or vomiting. 20 tablet 1  . oxyCODONE (ROXICODONE) 15 MG immediate release tablet Take 1 tablet (15 mg total) by mouth every 4 (four) hours as needed for pain. 20 tablet 0  . PRESCRIPTION MEDICATION Chemotherapy  at [facility] (Dr. Julien Nordmann).  Last dose of  Alimta 12/29/2014    . prochlorperazine (COMPAZINE) 10 MG tablet Take 1 tablet (10 mg total) by mouth every 6 (six) hours as needed for nausea or vomiting. 30 tablet 1  . Tamsulosin HCl (FLOMAX) 0.4 MG CAPS Take 0.4 mg by mouth every morning.     Marland Kitchen albuterol (PROVENTIL HFA;VENTOLIN HFA) 108 (90 BASE) MCG/ACT inhaler Inhale 2 puffs into the lungs every 6 (six) hours as needed for wheezing or shortness of breath.    Marland Kitchen albuterol (PROVENTIL) (2.5 MG/3ML) 0.083% nebulizer solution Take 3 mLs (2.5 mg total) by nebulization every 6 (six) hours as needed for wheezing. (Patient not taking: Reported on 02/02/2015) 75 mL 3  . flurazepam (DALMANE) 30 MG capsule Take 30 mg by mouth at bedtime as needed for sleep.     No current facility-administered medications for this visit.    SURGICAL HISTORY:  Past Surgical History  Procedure Laterality Date  . Ankle surgery  2005  . Prostate surgery  11/25/2010  . Hernia repair    . Mediastinoscopy  06/11/2012    Procedure: MEDIASTINOSCOPY;  Surgeon: Grace Isaac, MD;  Location: [facility];  Service: Thoracic;  Laterality: N/A;  . Inguinal hernia repair      left  . Eye surgery      cataract right  . Video bronchoscopy with endobronchial ultrasound  10/08/2012    Procedure: VIDEO BRONCHOSCOPY WITH ENDOBRONCHIAL ULTRASOUND;  Surgeon: Collene Gobble, MD;  Location: [facility];  Service: Pulmonary;  Laterality: N/A;  . Video bronchoscopy Bilateral 04/11/2014    Procedure: VIDEO BRONCHOSCOPY WITHOUT FLUORO;  Surgeon: Collene Gobble, MD;  Location: [facility];  Service: Cardiopulmonary;  Laterality: Bilateral;    REVIEW OF SYSTEMS:  Constitutional: positive for fatigue and malaise Eyes: negative Ears, nose, mouth, throat, and face: negative Respiratory: positive for dyspnea on exertion Cardiovascular: negative Gastrointestinal: positive for Status post percutaneous cholecystostomy with percutaneous drain in place. Reports increased  right upper quadrant pain Genitourinary:negative Integument/breast: negative Hematologic/lymphatic: negative Musculoskeletal:positive for back pain Neurological: negative Behavioral/Psych: negative Endocrine: negative Allergic/Immunologic: negative   PHYSICAL EXAMINATION: General appearance: alert, cooperative, no distress and On oxygen at 2 L via nasal cannula, appears uncomfortable Head: Normocephalic, without obvious abnormality, atraumatic Neck: no adenopathy, no JVD, supple, symmetrical, trachea midline and thyroid not enlarged, symmetric, no tenderness/mass/nodules Lymph nodes: Cervical, supraclavicular, and axillary nodes normal. Resp: wheezes bilaterally and minimal Back: symmetric, no curvature. ROM normal. No CVA tenderness. Cardio: regular rate and rhythm, S1, S2 normal, no murmur, click, rub or gallop GI: soft, non-tender; bowel sounds normal; no masses,  no organomegaly and Percutaneous drain in place functioning properly, moderate amount of drainage in bag Extremities: extremities normal, atraumatic, no cyanosis or edema Neurologic: Alert and oriented X 3, normal strength and tone. Normal symmetric reflexes. Normal coordination and gait  ECOG PERFORMANCE STATUS: 1 - Symptomatic but completely ambulatory  Blood pressure 145/61, pulse 68,  temperature 98 F (36.7 C), temperature source Oral, resp. rate 20, height 6\' 2"  (1.88 m), weight 218 lb 1.6 oz (98.93 kg), SpO2 93 %.  LABORATORY DATA: Lab Results  Component Value Date   WBC 7.7 02/02/2015   HGB 10.4* 02/02/2015   HCT 31.8* 02/02/2015   MCV 97.0 02/02/2015   PLT 278 02/02/2015      Chemistry      Component Value Date/Time   NA 137 02/02/2015 1131   NA 138 01/08/2015 0518   K 4.5 02/02/2015 1131   K 3.3* 01/08/2015 0518   CL 95* 01/08/2015 0518   CO2 24 02/02/2015 1131   CO2 35* 01/08/2015 0518   BUN 13.4 02/02/2015 1131   BUN 12 01/08/2015 0518   CREATININE 1.1 02/02/2015 1131   CREATININE 0.83  01/08/2015 0518      Component Value Date/Time   CALCIUM 9.7 02/02/2015 1131   CALCIUM 8.8 01/08/2015 0518   ALKPHOS 148 02/02/2015 1131   ALKPHOS 74 01/06/2015 0306   AST 26 02/02/2015 1131   AST 26 01/06/2015 0306   ALT 30 02/02/2015 1131   ALT 17 01/06/2015 0306   BILITOT 0.37 02/02/2015 1131   BILITOT 1.0 01/06/2015 0306       RADIOGRAPHIC STUDIES: Dg Chest 2 View  01/06/2015   CLINICAL DATA:  Patient being treated for pneumonia. History of hypertension.  EXAM: CHEST  2 VIEW  COMPARISON:  Chest radiograph 01/02/2015.  FINDINGS: Multiple monitoring leads overlie the patient. Stable cardiomegaly. Interval increase in left lower lobe heterogeneous opacities likely secondary to known left lower lobe pulmonary mass as well as adjacent pulmonary consolidation and left pleural effusion. Minimal right basilar atelectasis. Degenerative changes mid thoracic spine. Large hiatal hernia.  IMPRESSION: Increased heterogeneous opacities left lung base likely secondary to known left lower lobe pulmonary mass, associated peripheral heterogeneous opacities which may represent postobstructive atelectasis or pneumonia. Additionally there is a small left pleural effusion.   Electronically Signed   By: 03/04/2015 M.D.   On: 01/06/2015 09:49   Ct Abdomen W Contrast  01/06/2015   CLINICAL DATA:  Epigastric pain and nausea for 3 days.  EXAM: CT ABDOMEN WITH CONTRAST  TECHNIQUE: Multidetector CT imaging of the abdomen was performed using the standard protocol following bolus administration of intravenous contrast.  CONTRAST:  03/08/2015 OMNIPAQUE IOHEXOL 300 MG/ML  SOLN  COMPARISON:  PET-CT scan 04/24/2014  FINDINGS: Lower chest: There are new bilateral small effusions left great than right. Mild atelectasis at the left lung base. No pericardial fluid. Large hiatal hernia noted posterior to right atrium.  Hepatobiliary: No focal hepatic lesion.  No biliary duct dilatation.  The gallbladder is distended to 49 mm. The  gallbladder wall is mildly thickened. There is small amount of pericholecystic fluid. There is inflammatory stranding within the fat adjacent to the gallbladder (image 36 of series 10). There is inflation inflammation in the porta hepatis. The common bile duct appears normal caliber. There are no radiodense gallstones evident.  Pancreas: The pancreas is atrophic. There is no pancreatic duct dilatation or significant inflammation.  Spleen: Normal spleen.  Normal splenic vein.  Adrenals/urinary tract: Adrenal glands and kidneys are normal.  Stomach/Bowel: There is mild inflammation along the second portion duodenum. No bowel obstruction. Limited view of the small bowel and colon are unremarkable. The entirety of the bowel is not imaged.  Vascular/Lymphatic: Abdominal aorta is normal caliber without focal calcifications. No retroperitoneal periportal lymphadenopathy.  Musculoskeletal: No aggressive osseous lesion.  Other:  IMPRESSION: 1. Distended gallbladder with gallbladder wall thickening and pericholecystic fluid is most consistent with acute cholecystitis. 2. No biliary duct dilatation. 3. Pancreas appears normal. 4. Bilateral small effusions. 5. Large hiatal hernia. Findings conveyed toFloor nurse Davidon 01/06/2015  at17:06.   Electronically Signed   By: Suzy Bouchard M.D.   On: 01/06/2015 17:06   Ir Perc Cholecystostomy  01/07/2015   CLINICAL DATA:  Acute calculus cholecystitis. The patient is not a candidate for immediate cholecystectomy and requires placement of a percutaneous cholecystostomy tube.  EXAM: PERCUTANEOUS CHOLECYSTOSTOMY  COMPARISON:  CT of the abdomen on 01/06/2015  ANESTHESIA/SEDATION: 2.0 mg IV Versed; 100 mcg IV Fentanyl.  Total Moderate Sedation Time  17 minutes  CONTRAST:  13mL OMNIPAQUE IOHEXOL 300 MG/ML  SOLN  MEDICATIONS: A schedule dose of 3.375 g IV Zosyn was infusing during the procedure.  FLUOROSCOPY TIME:  42 seconds.  PROCEDURE: The procedure, risks, benefits, and alternatives  were explained to the patient. Questions regarding the procedure were encouraged and answered. The patient understands and consents to the procedure. A timeout was performed prior to the procedure.  The right abdominal wall was prepped with Betadine in a sterile fashion, and a sterile drape was applied covering the operative field. A sterile gown and sterile gloves were used for the procedure. Local anesthesia was provided with 1% Lidocaine.  Ultrasound was utilized to localize the gallbladder. Under direct ultrasound guidance, a 21 gauge needle was advanced via a transhepatic approach into the gallbladder lumen. Ultrasound image documentation was performed. Aspiration was performed and a bile sample sent for culture studies. A small amount of diluted contrast material was injected. A guide wire was then advanced into the gallbladder. A transitional dilator was placed.  Percutaneous tract dilatation was then performed over a guide wire to 10-French. A 10-French pigtail drainage catheter was then advanced into the gallbladder lumen under fluoroscopy. Catheter was formed and injected with contrast material to confirm position. The catheter was flushed and connected to a gravity drainage bag. It was secured at the skin with a Prolene retention suture and Stat-Lock device.  COMPLICATIONS: None  FINDINGS: After needle puncture of the gallbladder, a bile sample was aspirated and sent for culture. The cholecystostomy tube was advanced into the gallbladder lumen and formed. It is now draining bile. This tube will be left to gravity drainage.  IMPRESSION: Percutaneous cholecystostomy with placement of 10-French drainage catheter into the gallbladder lumen. This was left to gravity drainage.   Electronically Signed   By: Aletta Edouard M.D.   On: 01/07/2015 13:53   Ir Radiologist Eval & Mgmt  01/30/2015   EXAM: ESTABLISHED PATIENT OFFICE VISIT  CHIEF COMPLAINT: Abdominal pain at site of cholecystostomy drain  Current Pain  Level: 4  HISTORY OF PRESENT ILLNESS: Cholecystostomy drain placed 01/07/15 in IR.  Has done well with drain but pain at site is noted.  Pain has always been at a low level but increasing for last several days.  He has come in today for evaluation of same.  PHYSICAL EXAMINATION: Upon undressing the site it is noted that tubing is pulled tightly into stat lock system.  Pulling the skin tightly- causing skin to 'tent' out of drain site.  Releasing tubing from stat lock system relieves pain immediately.  Skin is NT; no bleeding.  No redness; or sign of infection.  Sutures are intact.  Drain tubing intact.  Output is bilious with approximately 100 cc in bag now.  ASSESSMENT AND PLAN: Redressed with looser  fitting of drain tubing into stat lock system.  Reassurance.  Appointment made for pt to return 02/18/15 at 800 am for drain injection; evaluation; possible removal.  Papers from SNF were filled out with all the above findings and recommendations.  He and family leave here today with good understanding of this plan.  Read by:  Lavonia Drafts [facility]   Electronically Signed   By: Markus Daft M.D.   On: 01/29/2015 15:20   ASSESSMENT AND PLAN: This is a very pleasant 74 years old white male recently diagnosed with a stage IIIB non-small cell lung cancer, adenocarcinoma with large left lower lobe lung mass in addition to ipsilateral and contralateral mediastinal lymphadenopathy. He was not a candidate for concurrent radiotherapy because of the large volume of treatment area.  The patient underwent a course of systemic chemotherapy with carboplatin and Alimta status post 6 cycles. He tolerated his treatment fairly well and the recent CT scan of the chest showed further improvement in his disease. He is currently undergoing maintenance chemotherapy with single agent Alimta status post 4 cycles and tolerating his treatment well. The recent CT scan of the chest showed no evidence for disease progression. Patient was discussed  with and also seen by Dr. Julien Nordmann. We will again postpone the start of cycle #5 of his maintenance chemotherapy with single agent Alimta by 3 weeks  given his current percutaneous drain to allow further resolution of this issue. He'll follow-up in 3 weeks with repeat CBC differential, C met and hopefully proceed with cycle #5 if all is well. Patient is sister are in agreement with this plan. As advised to follow-up with gastroenterology and interventional radiology as scheduled. He was advised to call immediately if he has any concerning symptoms in the interval.  The patient voices understanding of current disease status and treatment options and is in agreement with the current care plan.  All questions were answered. The patient knows to call the clinic with any problems, questions or concerns. We can certainly see the patient much sooner if necessary.  Carlton Adam, PA-C 02/02/2015  ADDENDUM: Hematology/Oncology Attending: I had a face to face encounter with the patient. I recommended his care plan. This is a very pleasant 74 years old white male with stage IIIB non-small cell lung cancer status post induction chemotherapy and currently undergoing maintenance chemotherapy with single agent Alimta status post 4 cycles. His treatment has been on hold secondary to recent cholecystitis requiring percutaneous drain. The patient is feeling fine with no specific complaints. He was supposed to start cycle #5 of his chemotherapy today but the patient is not feeling well and he would like to delay his treatment by 3 weeks. He would come back for follow-up visit in 3 weeks for reevaluation with the start of cycle #5. He was advised to call immediately if he has any concerning symptoms in the interval.  Disclaimer: This note was dictated with voice recognition software. Similar sounding words can inadvertently be transcribed and may be missed upon review. Eilleen Kempf., MD 02/08/2015

## 2015-02-02 NOTE — Telephone Encounter (Signed)
gave and printd appt sched and avs for pt for April adn May...sed added tx.

## 2015-02-03 ENCOUNTER — Other Ambulatory Visit: Payer: Self-pay | Admitting: Surgery

## 2015-02-03 ENCOUNTER — Other Ambulatory Visit (HOSPITAL_COMMUNITY): Payer: Self-pay | Admitting: Internal Medicine

## 2015-02-03 ENCOUNTER — Encounter (HOSPITAL_COMMUNITY): Payer: Self-pay

## 2015-02-03 ENCOUNTER — Ambulatory Visit (HOSPITAL_COMMUNITY)
Admission: RE | Admit: 2015-02-03 | Discharge: 2015-02-03 | Disposition: A | Discharge status: Home / Self Care | Payer: Medicare Other | Source: Ambulatory Visit | Attending: Internal Medicine | Admitting: Internal Medicine

## 2015-02-03 DIAGNOSIS — C3490 Malignant neoplasm of unspecified part of unspecified bronchus or lung: Secondary | ICD-10-CM | POA: Insufficient documentation

## 2015-02-03 MED ORDER — IOHEXOL 300 MG/ML  SOLN
80.0000 mL | Freq: Once | INTRAMUSCULAR | Status: AC | PRN
  Administered 2015-02-03: 80 mL via INTRAVENOUS

## 2015-02-03 NOTE — H&P (Signed)
Tyler [name] 02/03/2015 4:32 PM Location: [name] Patient #: [id number] DOB: May 17, 1941 Single / Language: Tyler [name] / Race: White Male History of Present Illness Tyler Hector MD; 02/03/2015 5:21 PM) Patient words: reck.  The patient is a 74 year old male who presents for evaluation of gall stones. Patient returns from recent hospital admission with acute cholecystitis. On chronic chemotherapy for lung cancer. Numerous other health issues. Came in with pneumonia. We felt he was to decondition to tolerate urgent cholecystectomy. Therefore percutaneous cholecystostomy tube drainage done. Eventually discharged. Still has chronic soreness in the RIGHT upper quadrant markedly worse. They're markedly better either. Bowel movement about every other day. Still with drainage. Hungry but doesn't medicinally enjoy the nursing home food. Patient does not like to. Worried about recurrent attacks. Really wants to get rid of drain at some point. His open to the idea of surgery. Other Problems [name], CMA; 02/03/2015 4:33 PM) Anxiety Disorder Arthritis Back Pain Chest pain Chronic Obstructive Lung Disease Depression Emphysema Of Lung Gastroesophageal Reflux Disease High blood pressure Home Oxygen Use Lung Cancer  Past Surgical History [name], CMA; 02/03/2015 4:33 PM) Cataract Surgery Right. Foot Surgery Right.  Diagnostic Studies History [name], CMA; 02/03/2015 4:33 PM) Colonoscopy never  Allergies [name], CMA; 02/03/2015 4:36 PM) Ketorolac *ANALGESICS - ANTI-INFLAMMATORY* Codeine Phosphate *ANALGESICS - OPIOID* Morphine Sulfate (Concentrate) *ANALGESICS - OPIOID* TraMADol HCl *ANALGESICS - OPIOID* Tussionex Pennkinetic ER *COUGH/COLD/ALLERGY*  Medication History ([name], CMA; 02/03/2015 4:34 PM) ALPRAZolam (1MG  Tablet, Oral) Active. Amitriptyline HCl (10MG  Tablet, Oral) Active. Bystolic (10MG  Tablet, Oral)  Active. Dexamethasone (4MG  Tablet, Oral) Active. Escitalopram Oxalate (20MG  Tablet, Oral) Active. Folic Acid (1MG  Tablet, Oral) Active. Lumigan (0.01% Solution, Ophthalmic) Active. Naproxen (500MG  Tablet, Oral) Active. Omeprazole (20MG  Capsule DR, Oral) Active. Prochlorperazine Maleate (10MG  Tablet, Oral) Active. Tamsulosin HCl (0.4MG  Capsule, Oral) Active. Naproxen (250MG  Tablet, Oral) Active. Medications Reconciled  Social History [name], CMA; 02/03/2015 4:33 PM) Alcohol use Moderate alcohol use. Caffeine use Coffee. Tobacco use Former smoker.  Family History [name], CMA; 02/03/2015 4:33 PM) Diabetes Mellitus Mother, Sister. Heart Disease Sister. Hypertension Sister.     Review of Systems ([name]; 02/03/2015 4:33 PM) General Present- Fatigue. Not Present- Appetite Loss, Chills, Fever, Night Sweats, Weight Gain and Weight Loss. HEENT Present- Sinus Pain, Visual Disturbances and Wears glasses/contact lenses. Not Present- Earache, Hearing Loss, Hoarseness, Nose Bleed, Oral Ulcers, Ringing in the Ears, Seasonal Allergies, Sore Throat and Yellow Eyes. Respiratory Present- Difficulty Breathing and Wheezing. Not Present- Bloody sputum, Chronic Cough and Snoring. Cardiovascular Present- Chest Pain and Shortness of Breath. Not Present- Difficulty Breathing Lying Down, Leg Cramps, Palpitations, Rapid Heart Rate and Swelling of Extremities. Gastrointestinal Present- Gets full quickly at meals and Indigestion. Not Present- Abdominal Pain, Bloating, Bloody Stool, Change in Bowel Habits, Chronic diarrhea, Constipation, Difficulty Swallowing, Excessive gas, Hemorrhoids, Nausea, Rectal Pain and Vomiting. Male Genitourinary Present- Frequency. Not Present- Blood in Urine, Change in Urinary Stream, Impotence, Nocturia, Painful Urination, Urgency and Urine Leakage.  Vitals ([name] CMA; 02/03/2015 4:33 PM) 02/03/2015 4:33 PM Weight: 222 lb Height: 74in Body  Surface Area: 2.29 m Body Mass Index: 28.5 kg/m Temp.: 97.60F(Temporal)  Pulse: 77 (Regular)  BP: 132/80 (Sitting, Left Arm, Standard)     Physical Exam Tyler Hector MD; 02/03/2015 5:22 PM)  General Mental Status-Alert. General Appearance-Not in acute distress. Voice-Normal.  Integumentary Global Assessment Upon inspection and palpation of skin surfaces of the - Distribution of scalp and body hair is  normal. General Characteristics Overall examination of the patient's skin reveals - no rashes and no suspicious lesions.  Head and Neck Head-normocephalic, atraumatic with no lesions or palpable masses. Face Global Assessment - atraumatic, no absence of expression. Neck Global Assessment - no abnormal movements, no decreased range of motion. Trachea-midline. Thyroid Gland Characteristics - non-tender.  Eye Eyeball - Left-Extraocular movements intact, No Nystagmus. Eyeball - Right-Extraocular movements intact, No Nystagmus. Upper Eyelid - Left-No Cyanotic. Upper Eyelid - Right-No Cyanotic. Note: wears glasses   ENMT Note: Oxygen 2 L nasal cannula   Chest and Lung Exam Inspection Accessory muscles - No use of accessory muscles in breathing. Note: Decreased breath sounds at bases   Abdomen Note: Drain in RIGHT abdomen. Thinly bilious. No cellulitis. No purulence some fullness and upper abdomen but no peritonitis. No Murphy sign. No guarding. No hernias.   Peripheral Vascular Upper Extremity Inspection - Left - Not Gangrenous, No Petechiae. Right - Not Gangrenous, No Petechiae.  Neurologic Neurologic evaluation reveals -normal attention span and ability to concentrate, able to name objects and repeat phrases. Appropriate fund of knowledge and normal coordination.  Neuropsychiatric Mental status exam performed with findings of-able to articulate well with normal speech/language, rate, volume and coherence and no evidence of  hallucinations, delusions, obsessions or homicidal/suicidal ideation. Orientation-oriented X3.  Musculoskeletal Global Assessment Gait and Station - normal gait and station.  Lymphatic General Lymphatics Description - No Generalized lymphadenopathy.    Assessment & Plan Tyler Hector MD; 02/03/2015 5:25 PM)  CHRONIC CHOLECYSTITIS WITH CALCULUS (574.10  K80.10) Impression: Discuss with my partners. Discussed with the patient and his wife. Standard of care is internal cholecystectomy 6?8 weeks from percutaneous placement. The hopefully minimize his chance of inflammation but is not waits along that cholangitis can develop was drained.  Would like a cardiac clearance. Maybe and pulmonary clearance. If they feel he can tolerate surgery, proceed with surgery. Patient is inclined to proceed with surgery if possible.  If extremely prohibitive operative candidate, then do cholangiography 6 weeks. If no more obstruction, remove drained and intermittently replaced drain with repeated attacks.  Last option is leave drain in for the rest of his life. He really does not like that idea.  The patient does have lung cancer but his disease has been improved with chemotherapy and is controled with maintenance chemotherapy. Reasonable for laparoscopic approach. He is on oxygen and deconditioned occasionally using wheelchair but is stable. Appetite good. Improving from discharge. As long as cleared by specialists, we aggressive proceed with surgery since his risk and risk of recurrent cholecystitis his with intermittent drains only. He has numerous stones  Current Plans Schedule for Surgery The anatomy & physiology of hepatobiliary & pancreatic function was discussed. The pathophysiology of gallbladder dysfunction was discussed. Natural history risks without surgery was discussed. I feel the risks of no intervention will lead to serious problems that outweigh the operative risks; therefore, I recommended  cholecystectomy to remove the pathology. I explained laparoscopic techniques with possible need for an open approach. Probable cholangiogram to evaluate the bilary tract was explained as well.  Risks such as bleeding, infection, abscess, leak, injury to other organs, need for further treatment, heart attack, death, and other risks were discussed. I noted a good likelihood this will help address the problem. Possibility that this will not correct all abdominal symptoms was explained. Goals of post-operative recovery were discussed as well. We will work to minimize complications. An educational handout further explaining the pathology and treatment options was  given as well. Questions were answered. The patient expresses understanding & wishes to proceed with surgery. Pt Education - [facility] Laparosopic Post Op HCI (Maclean Foister) Pt Education - [facility] Good Bowel Health (Anthonny Schiller) Pt Education - [facility] Pain Control (Clarie Camey)  Tyler [name], M.D., F.A.C.S. Gastrointestinal and Minimally Invasive Surgery [facility] [address] [place], [place] [address] [phone] Main / Paging

## 2015-02-04 ENCOUNTER — Telehealth: Payer: Self-pay | Admitting: Emergency Medicine

## 2015-02-04 NOTE — Telephone Encounter (Signed)
Has gallbladder drainage in place, but needing to have gallbladder removed, schedule pt for 5/11 per office request until they hear from [place] about sooner appt

## 2015-02-04 NOTE — Telephone Encounter (Signed)
Spoke with Elmo Putt to verify pt's appt date and time already scheduled with [facility] for 4/18 at 11:45.  Elmo Putt states she will call pt to make him aware of appt.  Nothing further needed.

## 2015-02-05 ENCOUNTER — Telehealth: Payer: Self-pay | Admitting: Cardiology

## 2015-02-05 NOTE — Telephone Encounter (Signed)
Received records from [facility] for appointment with Dr Percival Spanish on 03/03/15.  Records given to [facility] (medical records) for Dr Hochrein's schedule on 03/03/15. lp

## 2015-02-05 NOTE — Patient Instructions (Signed)
As your not up to proceeding with chemotherapy as scheduled today we are postponing for 3 weeks Follow-up with gastroenterology and interventional radiology as scheduled Follow-up in 3 weeks

## 2015-02-06 ENCOUNTER — Telehealth: Payer: Self-pay | Admitting: *Deleted

## 2015-02-06 ENCOUNTER — Telehealth: Payer: Self-pay | Admitting: Internal Medicine

## 2015-02-06 NOTE — Telephone Encounter (Signed)
Call from [place], Pharmacist at [facility] pt will need Infusion appt for 4/25- next chemo due at this time. POF to scheduler.

## 2015-02-06 NOTE — Telephone Encounter (Signed)
S/w pt confirming labs/chemo schedule and mailed out schedule to pt... KJ

## 2015-02-06 NOTE — Telephone Encounter (Signed)
Per staff message and POF I have scheduled appts. Advised scheduler of appts. JMW  

## 2015-02-09 ENCOUNTER — Other Ambulatory Visit: Payer: Medicare Other

## 2015-02-09 ENCOUNTER — Ambulatory Visit: Payer: Medicare Other

## 2015-02-16 ENCOUNTER — Ambulatory Visit: Payer: Medicare Other | Admitting: Internal Medicine

## 2015-02-18 ENCOUNTER — Ambulatory Visit (HOSPITAL_COMMUNITY): Admission: RE | Admit: 2015-02-18 | Payer: Medicare Other | Source: Ambulatory Visit

## 2015-02-18 ENCOUNTER — Ambulatory Visit (INDEPENDENT_AMBULATORY_CARE_PROVIDER_SITE_OTHER): Payer: Medicare Other | Admitting: Internal Medicine

## 2015-02-18 ENCOUNTER — Encounter: Payer: Self-pay | Admitting: Internal Medicine

## 2015-02-18 ENCOUNTER — Ambulatory Visit (INDEPENDENT_AMBULATORY_CARE_PROVIDER_SITE_OTHER)
Admission: RE | Admit: 2015-02-18 | Discharge: 2015-02-18 | Disposition: A | Discharge status: Home / Self Care | Payer: Medicare Other | Source: Ambulatory Visit | Attending: Internal Medicine | Admitting: Internal Medicine

## 2015-02-18 VITALS — BP 100/60 | HR 58 | Ht 74.0 in | Wt 219.0 lb

## 2015-02-18 DIAGNOSIS — J449 Chronic obstructive pulmonary disease, unspecified: Secondary | ICD-10-CM

## 2015-02-18 DIAGNOSIS — C3402 Malignant neoplasm of left main bronchus: Secondary | ICD-10-CM

## 2015-02-18 NOTE — Patient Instructions (Signed)
Please remember to go to the   x-ray department downstairs for your tests - we will call you with the results when they are available.  Once I have a chance to review your cxr I will clear you for surgery

## 2015-02-18 NOTE — Progress Notes (Signed)
Subjective:    Patient ID: Tyler [name], male    DOB: [birth year]    MRN: [id number]      [id number]  former heavy smoker, hx HTN, DM, allergies , lung ca seen in consultation for preop eval by Tyler [name] s/p admit:         Admit date: 01/02/2015 Discharge date: 01/08/2015  Time spent: 45 minutes  Recommendations for Outpatient Follow-up:  [facility] He should continue physical as well as occupational therapy is recommended by the facility. Patient will need a follow-up with his primary care doctor within 1 week of discharge. Patient will also need follow-up with general surgery, Tyler [name], within 3-4 weeks of discharge. Patient's continue his medications as prescribed. Patient should continue a heart healthy diet.  Discharge Diagnoses:  Acute cholecystitis Possible postobstructive pneumonia Metastatic non-small cell lung cancer Depression BPH status post TURP Asymptomatic bacteriuria Chemotherapy-induced neutropenia Elevated BNP  Discharge Condition: Stable  Diet recommendation: Heart healthy  Filed Weights   01/02/15 1048  Weight: 103.42 kg (228 lb)    History of present illness:  on 01/02/2015 by Tyler [name] 74 year old male with a history of COPD, hypertension, non-small cell lung carcinoma diagnosed in June 2015 (Tyler [name]), depression presents with 3 day history of worsening shortness of breath. At baseline, the patient uses 2 L nasal cannula when necessary activity and shortness of breath at home. The patient denies any hemoptysis but complains of a cough with white sputum. He denies any fevers, chills, hemoptysis, nausea, vomiting, diarrhea. There's been no orthopnea or PND. The patient last received chemotherapy proximally 4 days prior to this admission. He denies any abdominal pain, dysuria, hematochezia, melena. In the emergency department, hemoglobin was 10.5, WBC 5.1, sodium 132. Otherwise BMP was unremarkable. EKG  shows sinus rhythm without any ST-T wave changes. CT abdomen and chest was negative for pulmonary embolus but showed his usual left lower lobe lung mass. There was increased consolidation and collapse in the left lower lobe. There was also multifocal patchy opacities.  Hospital Course:  Acute cholecystitis -CT of the abdomen: distended gallbladder, consistent acute cholecystitis -Gen. surgery consulted and appreciated, recommended IR for percutaneous drainage -IR consulted and appreciated, s/p percutaneous drain -Patient was placed on zosyn. Will discharge with Augmentin (total of 7 days of antibiotics) -Continue pain control -Patient will need to follow up with Dr. Johney [name] within 3-4 weeks -Patient will need to have the drain flushed  Possible postobstructive pneumonia -Patient initially placed on vancomycin and cefepime, then placed on Levaquin- but discontinued 3/8 -Strep and legionella urine antigens negative  Metastatic non-small cell lung cancer -Tyler [name], oncology made aware of patient's admission  Depression -Restart her Lexapro and Restoril -Elavil held  BPH status post TURP -Continue Flomax  Asymptomatic bacteriuria -Urine culture negative  Chemotherapy-induced neutropenia -Secondary to Alimta -Previous hospitalist had discussion with Tyler [name], Granix held  Elevated BNP -Echocardiogram EF 55-60%  Procedures  Percutaneous cholecystectomy  Consults  Interventional radiology General surgery       02/18/2015 f/u ov/Tyler [name] re: preop eval Chief Complaint  Patient presents with  . Follow-up    Pt here for pulmonary clearance for cholecystectomy. Pt states breathing is about the same since last visit here June 2015. He gets winded walking approx 200 ft. Has not used neb or albuterol inhaler on over a month.    last chemo by Tyler [name] one month prior to OV  rx is 02 2lpm 24/7 though did not bring  it in with him  At snf p last admit mostly riding in w/c  But  doing therapy ok  Not on any  breathing treatments at present "they never help"   No obvious day to day or daytime variabilty or assoc excess or purulent sputum  or cp or chest tightness, subjective wheeze overt sinus or hb symptoms. No unusual exp hx or h/o childhood pna/ asthma or knowledge of premature birth.  Sleeping ok without nocturnal  or early am exacerbation  of respiratory  c/o's or need for noct saba. Also denies any obvious fluctuation of symptoms with weather or environmental changes or other aggravating or alleviating factors except as outlined above   Current Medications, Allergies, Complete Past Medical History, Past Surgical History, Family History, and Social History were reviewed in [employer] record.  ROS  The following are not active complaints unless bolded sore throat, dysphagia, dental problems, itching, sneezing,  nasal congestion or excess/ purulent secretions, ear ache,   fever, chills, sweats, unintended wt loss, pleuritic or exertional cp, hemoptysis,  orthopnea pnd or leg swelling, presyncope, palpitations, heartburn, abdominal pain better since cholecystostomy, anorexia, nausea, vomiting, diarrhea  or change in bowel or urinary habits, change in stools or urine, dysuria,hematuria,  rash, arthralgias, visual complaints, headache, numbness weakness or ataxia or problems with walking or coordination,  change in mood/affect or memory.          Objective:   Physical Exam   Gen: somewhat cantankerous  Elderly wm nad  in no distress,  Very somber / ? Hopeless affect  Wt Readings from Last 3 Encounters:  02/18/15 219 lb (99.338 kg)  02/02/15 218 lb 1.6 oz (98.93 kg)  01/19/15 222 lb 9.6 oz (100.971 kg)    Vital signs reviewed   ENT: No lesions,  mouth clear,  oropharynx clear, no postnasal drip  Neck: No JVD, no TMG, no carotid bruits,    Lungs: No use of accessory muscles,  Decreased bs on L no wheeze or rhonchi  Cardiovascular: RRR,  heart sounds normal, no murmur or gallops, no peripheral edema  abd obese/ non tender with ok excursion in supine position and cholecystostomy tube in place draining clear bilious fluid  Musculoskeletal: No deformities, no cyanosis or clubbing  Neuro: alert, non focal  Skin: Warm, no lesions or rashes     CXR PA and Lateral:   02/18/2015 :     I personally reviewed images and agree with radiology impression as follows:     Post treatment changes in the left lung with interval improved lung base ventilation. No new cardiopulmonary abnormality.   Assessment & Plan:

## 2015-02-19 ENCOUNTER — Encounter: Payer: Self-pay | Admitting: Internal Medicine

## 2015-02-19 NOTE — Assessment & Plan Note (Signed)
DIAGNOSIS: Stage IIIB (T2b, N3, M0) non-small cell lung cancer, adenocarcinoma, diagnosed in June of 2015 with large left lower lobe lung mass in addition to ipsilateral and contralateral mediastinal lymphadenopathy.  PRIOR THERAPY: Systemic chemotherapy with carboplatin for AUC of 5 and Alimta 500 mg/M2 every 3 weeks but status post 6 cycles. Last dose was given 09/08/2014 with partial response.  CURRENT THERAPY: Maintenance chemotherapy with single agent Alimta 500 MG/M2 every 3 weeks. Status post one cycle. First dose 10/27/2014  Appears to be responding to rx per Dr Malena Peer based on cxr but still contributes to the restrictive changes on pfts

## 2015-02-19 NOTE — Assessment & Plan Note (Signed)
Spirometry 02/18/15 FEV1  1.90 (50%) ratio 62 off all rx    I had an extended discussion with the patient reviewing all relevant studies completed to date and  lasting 35 minutes on the following ongoing concerns:  Tyler [name] only has mild airflow obstruction and the main issues are obesity and lung CA affecting his lung volumes and unfortunately the maint affect of the latter is on the L HD and the main affect likely to occur p cholecytectomy is on the RHD  So Tyler [name] has sign risk of post pos op atx/ pna/ resp failure that can not be reduced by any preop "tune up " as there is not no AB/ increased secretions etc  Main goal therefore is  1) needs to accept increased risk of post op issues vs his present qol, which is poor with tube in place  2) post op needs to mobilize/ use IS/ nebs prn and minimize narcs  Discussed in detail all the  indications, usual  risks and alternatives  relative to the benefits with patient who agrees to proceed with elective cholecystectomy

## 2015-02-19 NOTE — Progress Notes (Signed)
Quick Note:  LMTCB ______ 

## 2015-02-23 ENCOUNTER — Ambulatory Visit: Payer: Medicare Other

## 2015-02-23 ENCOUNTER — Other Ambulatory Visit: Payer: Medicare Other

## 2015-02-23 NOTE — Progress Notes (Signed)
Quick Note:  LMTCB ______ 

## 2015-02-24 NOTE — Progress Notes (Signed)
Quick Note:  LMTCB ______ 

## 2015-02-27 NOTE — Progress Notes (Signed)
Quick Note:  LMOM with results ______ 

## 2015-03-02 ENCOUNTER — Ambulatory Visit: Payer: Medicare Other

## 2015-03-02 ENCOUNTER — Ambulatory Visit: Payer: Medicare Other | Admitting: Internal Medicine

## 2015-03-02 ENCOUNTER — Other Ambulatory Visit: Payer: Medicare Other

## 2015-03-03 ENCOUNTER — Encounter: Payer: Self-pay | Admitting: Cardiology

## 2015-03-03 ENCOUNTER — Ambulatory Visit (INDEPENDENT_AMBULATORY_CARE_PROVIDER_SITE_OTHER): Payer: Medicare Other | Admitting: Cardiology

## 2015-03-03 ENCOUNTER — Telehealth: Payer: Self-pay | Admitting: Emergency Medicine

## 2015-03-03 VITALS — BP 110/60 | HR 70 | Ht 74.0 in | Wt 217.0 lb

## 2015-03-03 DIAGNOSIS — R0602 Shortness of breath: Secondary | ICD-10-CM

## 2015-03-03 NOTE — Telephone Encounter (Signed)
Spoke with pt. Advised him of our office policy when switching providers. He agreed.  RB - are you okay with pt switching to [facility]?

## 2015-03-03 NOTE — Patient Instructions (Signed)
Your physician recommends that you schedule a follow-up appointment As Needed  

## 2015-03-03 NOTE — Progress Notes (Signed)
Cardiology Office Note   Date:  03/03/2015   ID:  Tyler [name], DOB 1941/10/21, MRN [id number]  PCP:  Tamsen Roers, MD  Cardiologist:   Minus Breeding, MD   No chief complaint on file.     History of Present Illness: Tyler [name] is a 74 y.o. male who presents for  Preoperative evaluation prior to gallbladder surgery. He's had a complicated recent history and is being treated for stage IV lung cancer. He also had acute cholecystitis and was hospitalized in March. I reviewed these records. He's actually had no cardiac issues. In fact with his acute illness he had negative enzymes and no suggestion of ischemia. He did have a very mildly elevated BNP level. However, echocardiogram was essentially unremarkable with only mild regurgitation. The patient is now living at a nursing home. He is very limited by some leg weakness and balance problems. He gets around in a wheelchair for the most part. He is on oxygen most of the time. However, he denies any cardiovascular symptoms. In particular he's not having any PND or orthopnea. He's not having any palpitations, presyncope or syncope. He's had no weight gain or edema.     Past Medical History  Diagnosis Date  . BPH (benign prostatic hyperplasia)   . GERD (gastroesophageal reflux disease)   . COPD (chronic obstructive pulmonary disease)   . Arthritis   . Pneumonia     hx  . 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 Ankle surgery  2005  . Prostate surgery  11/25/2010  . Hernia repair    . Mediastinoscopy  06/11/2012    Procedure: MEDIASTINOSCOPY;  Surgeon: Grace Isaac, MD;  Location: [facility];  Service: Thoracic;  Laterality: N/A;  . Inguinal hernia repair      left  . Eye surgery      cataract right  . Video bronchoscopy with endobronchial ultrasound  10/08/2012    Procedure: VIDEO BRONCHOSCOPY WITH ENDOBRONCHIAL ULTRASOUND;  Surgeon: Collene Gobble, MD;   Location: [facility];  Service: Pulmonary;  Laterality: N/A;  . Video bronchoscopy Bilateral 04/11/2014    Procedure: VIDEO BRONCHOSCOPY WITHOUT FLUORO;  Surgeon: Collene Gobble, MD;  Location: [facility];  Service: Cardiopulmonary;  Laterality: Bilateral;     Current Outpatient Prescriptions  Medication Sig Dispense Refill  . alum & mag hydroxide-simeth (MAALOX/MYLANTA) 200-200-20 MG/5ML suspension Take 30 mLs by mouth every 6 (six) hours as needed for indigestion or heartburn (or bloating). 355 mL 0  . escitalopram (LEXAPRO) 20 MG tablet Take 20 mg by mouth daily.   12  . fluticasone (FLONASE) 50 MCG/ACT nasal spray Place 1 spray into both nostrils daily.  2  . folic acid (FOLVITE) 1 MG tablet Take 1 tablet (1 mg total) by mouth daily. 30 tablet 1  . latanoprost (XALATAN) 0.005 % ophthalmic solution 1 drop at bedtime.    . metoprolol succinate (TOPROL-XL) 100 MG 24 hr tablet Take 100 mg by mouth daily. Take with or immediately following a meal.    . oxyCODONE (ROXICODONE) 15 MG immediate release tablet Take 1 tablet (15 mg total) by mouth every 4 (four) hours as needed for pain. 20 tablet 0  . pantoprazole (PROTONIX) 40 MG tablet Take 40 mg by mouth daily.    . Tamsulosin HCl (FLOMAX) 0.4 MG CAPS Take 0.4 mg by mouth every morning.      No current facility-administered medications for  this visit.    Allergies:   Ketorolac tromethamine; Codeine; Morphine and related; Tramadol; and Tussionex pennkinetic er    Social History:  The patient  reports that he quit smoking about 5 years ago. His smoking use included Cigarettes. He has a 100 pack-year smoking history. He has never used smokeless tobacco. He reports that he drinks about 14.4 oz of alcohol per week. He reports that he does not use illicit drugs.   Family History:  The patient's family history includes Arthritis in his sister and sister; Diabetes in his mother; Liver cancer in his father.    ROS:  Please see the history of present  illness.   Otherwise, review of systems are positive for dizziness.   All other systems are reviewed and negative.    PHYSICAL EXAM: VS:  BP 110/60 mmHg  Pulse 70  Ht '6\' 2"'$  (1.88 m)  Wt 217 lb (98.431 kg)  BMI 27.85 kg/m2 , BMI Body mass index is 27.85 kg/(m^2). GENERAL:  Well appearing HEENT:  Pupils equal round and reactive, fundi not visualized, oral mucosa unremarkable NECK:  No jugular venous distention, waveform within normal limits, carotid upstroke brisk and symmetric, no bruits, no thyromegaly LYMPHATICS:  No cervical, inguinal adenopathy LUNGS:  Clear to auscultation bilaterally BACK:  No CVA tenderness CHEST:  Unremarkable HEART:  PMI not displaced or sustained,S1 and S2 within normal limits, no S3, no S4, no clicks, no rubs, no murmurs ABD:  Flat, positive bowel sounds normal in frequency in pitch, no bruits, no rebound, no guarding, no midline pulsatile mass, no hepatomegaly, no splenomegaly, RUQ drain.  EXT:  2 plus pulses throughout, no edema, no cyanosis no clubbing SKIN:  No rashes no nodules NEURO:  Cranial nerves II through XII grossly intact, motor grossly intact throughout PSYCH:  Cognitively intact, oriented to person place and time    EKG:  EKG is ordered today. The ekg ordered today demonstrates sinus rhythm, rate 70, axis WNL, intervals WNL, no acute ST T wave changes   Recent Labs: 10/19/2014: Pro B Natriuretic peptide (BNP) 128.3* 01/02/2015: B Natriuretic Peptide 151.1* 02/02/2015: ALT 30; BUN 13.4; Creatinine 1.1; Hemoglobin 10.4*; Platelets 278; Potassium 4.5; Sodium 137    Lipid Panel    Component Value Date/Time   CHOL 194 08/04/2011 0555   TRIG 262* 08/04/2011 0555   HDL 26* 08/04/2011 0555   CHOLHDL 7.5 08/04/2011 0555   VLDL 52* 08/04/2011 0555   LDLCALC 116* 08/04/2011 0555      Wt Readings from Last 3 Encounters:  03/03/15 217 lb (98.431 kg)  02/18/15 219 lb (99.338 kg)  02/02/15 218 lb 1.6 oz (98.93 kg)      Other studies  Reviewed: Additional studies/ records that were reviewed today include: Hospital records from March 2016. Review of the above records demonstrates:  Please see elsewhere in the note.     ASSESSMENT AND PLAN:  Preop:  The patient is not going for a high risk procedure.  He has no high risk findings or historical symptoms.  Therefore, based on ACC/AHA guidelines, the patient would be at acceptable risk for the planned procedure without further cardiovascular testing.   Current medicines are reviewed at length with the patient today.  The patient does not have concerns regarding medicines.  The following changes have been made:  no change  Labs/ tests ordered today include:   Orders Placed This Encounter  Procedures  . EKG 12-Lead     Disposition:   FU with me as  needed.     Signed, Minus Breeding, MD  03/03/2015 4:02 PM    [facility]

## 2015-03-04 ENCOUNTER — Encounter: Payer: Self-pay | Admitting: Cardiology

## 2015-03-04 NOTE — Telephone Encounter (Signed)
Yes, cleared for surgery , no need to see RB

## 2015-03-04 NOTE — Telephone Encounter (Signed)
Spoke with pt, he is aware that we are cancelling appt with RB.  Called Dr. Cherlyn Cushing office to make aware of clearance.  They are requesting a letter stating he is cleared for sx and fax this letter to  [phone].  This has been faxed.  Nothing further needed.

## 2015-03-04 NOTE — Telephone Encounter (Signed)
Dr gross office calling not wanting this appoint for 5/11 changed due to pt health issues, they are asking  That we call their office before make switch in dr. Please call [phone] for further details.Hillery Hunter

## 2015-03-04 NOTE — Telephone Encounter (Signed)
Spoke with Elmo Putt at Dr. Johney Maine' office, want to know if pt was cleared on 02/18/15 for cholecystectomy.  Pt is also scheduled for 03/11/15 with RB for same thing but if pt is already cleared there is no need for this visit.  Per AVS, it looks like MW was going to clear pt based on cxr results- cxr showed no change and actually improvement.  MW can you clarify that this pt is cleared for sx?  Thanks!  If so, we need to call Dr. Johney Maine' office to let them know and cx 5/11 appt with RB for this.

## 2015-03-06 ENCOUNTER — Telehealth: Payer: Self-pay | Admitting: *Deleted

## 2015-03-06 NOTE — Telephone Encounter (Signed)
Called patient to check on him due to his missed appt on 03/02/15.  He states he does not want any more treatments.  I listened as he explained.  I stated he will still have to be follow by Dr. Julien Nordmann.  I will update Dr. Julien Nordmann on patient's decision.

## 2015-03-11 ENCOUNTER — Ambulatory Visit: Payer: Medicare Other | Admitting: Emergency Medicine

## 2015-03-12 ENCOUNTER — Encounter (HOSPITAL_COMMUNITY): Payer: Self-pay

## 2015-03-12 ENCOUNTER — Encounter (HOSPITAL_COMMUNITY)
Admission: RE | Admit: 2015-03-12 | Discharge: 2015-03-12 | Disposition: A | Discharge status: Home / Self Care | Payer: Medicare Other | Source: Ambulatory Visit | Attending: Surgery | Admitting: Surgery

## 2015-03-12 DIAGNOSIS — K811 Chronic cholecystitis: Secondary | ICD-10-CM | POA: Insufficient documentation

## 2015-03-12 DIAGNOSIS — Z01812 Encounter for preprocedural laboratory examination: Secondary | ICD-10-CM | POA: Diagnosis not present

## 2015-03-12 HISTORY — DX: Allergic rhinitis, unspecified: J30.9

## 2015-03-12 HISTORY — DX: Opioid abuse, uncomplicated: F11.10

## 2015-03-12 HISTORY — DX: Urinary tract infection, site not specified: N39.0

## 2015-03-12 HISTORY — DX: Unspecified glaucoma: H40.9

## 2015-03-12 HISTORY — DX: Depression, unspecified: F32.A

## 2015-03-12 HISTORY — DX: Adverse effect of antineoplastic and immunosuppressive drugs, initial encounter: D70.1

## 2015-03-12 HISTORY — DX: Adverse effect of antineoplastic and immunosuppressive drugs, initial encounter: T45.1X5A

## 2015-03-12 HISTORY — DX: Acute cholecystitis: K81.0

## 2015-03-12 HISTORY — DX: Major depressive disorder, single episode, unspecified: F32.9

## 2015-03-12 HISTORY — DX: Anemia, unspecified: D64.9

## 2015-03-12 HISTORY — DX: Reserved for inherently not codable concepts without codable children: IMO0001

## 2015-03-12 LAB — BASIC METABOLIC PANEL
Anion gap: 8 (ref 5–15)
BUN: 15 mg/dL (ref 6–20)
CO2: 27 mmol/L (ref 22–32)
Calcium: 9.3 mg/dL (ref 8.9–10.3)
Chloride: 99 mmol/L — ABNORMAL LOW (ref 101–111)
Creatinine, Ser: 0.92 mg/dL (ref 0.61–1.24)
GFR calc Af Amer: >60 mL/min (ref >60)
Glucose, Bld: 136 mg/dL — ABNORMAL HIGH (ref 65–99)
POTASSIUM: 4.2 mmol/L (ref 3.5–5.1)
SODIUM: 134 mmol/L — AB (ref 135–145)

## 2015-03-12 LAB — CBC
HCT: 36 % — ABNORMAL LOW (ref 39.0–52.0)
Hemoglobin: 11.4 g/dL — ABNORMAL LOW (ref 13.0–17.0)
MCH: 30 pg (ref 26.0–34.0)
MCHC: 31.7 g/dL (ref 30.0–36.0)
MCV: 94.7 fL (ref 78.0–100.0)
PLATELETS: 192 10*3/uL (ref 150–400)
RBC: 3.8 MIL/uL — ABNORMAL LOW (ref 4.22–5.81)
RDW: 13.8 % (ref 11.5–15.5)
WBC: 6.7 10*3/uL (ref 4.0–10.5)

## 2015-03-12 NOTE — Progress Notes (Addendum)
Notes from nursing home per chart  OV note per Dr Hochrein/cardiology per chart 03/03/2015 - clearance noted CXR per epic 02/18/2015 EKG per epic 03/03/2015  ECHO per epic 01/03/2015 Stress test per epic 05/24/2012  Clearance per Dr Melvyn Novas / epic 03/04/2015 under telephone note

## 2015-03-12 NOTE — Patient Instructions (Signed)
[place]  03/12/2015   Your procedure is scheduled on:     Thursday Mar 19, 2015   Report to [facility] Main Entrance and follow signs to  [facility] arrive at 7:45 AM.  Call this number if you have problems the morning of surgery [phone] or Presurgical Testing [phone].   Remember:  Do not eat food or drink liquids :After Midnight.  For Living Will and/or Health Care Power Attorney Forms: please provide copy for your medical record, may bring AM of surgery (forms should be already notarized-we do not provide this service).     Take these medicines the morning of surgery (use sip of water with oral medications) Escitalopram (Lexapro); Flonase nasal spray if needed; Albuterol Inhaler if needed; Albuterol Nebulizer treatment if needed; Metoprolol; Oxycodone (Roxicodone) if needed; Ondasetron (Zofran) if needed; Pantoprazole (Protonix); Tamsulosin (Flomax)              ONLY ONE PERSON WITH YOU IN SHORT STAY MORNING OF SURGERY. IF TIME PERMITS AFTER YOU ARE READY, OTHERS MAY VISIT. NO MORE THAN 2 PERSONS IN ROOM AT A TIME.                                You may not have any metal on your body including hair pins and piercings  Do not wear jewelry, colognes, lotions, powders,or deodorants.             Men may shave face and neck.               Do not bring valuables to the hospital. [facility].  Contacts, dentures or bridgework may not be worn into surgery.  Leave suitcase in the car. After surgery it may be brought to your room.  For patients admitted to the hospital, checkout time is 11:00 AM the day of discharge.   ________________________________________________________________________  [facility] - Preparing for Surgery Before surgery, you can play an important role.  Because skin is not sterile, your skin needs to be as free of germs as possible.  You can reduce the number of germs on your skin by washing with CHG  (chlorahexidine gluconate) soap before surgery.  CHG is an antiseptic cleaner which kills germs and bonds with the skin to continue killing germs even after washing. Please DO NOT use if you have an allergy to CHG or antibacterial soaps.  If your skin becomes reddened/irritated stop using the CHG and inform your nurse when you arrive at Short Stay. Do not shave (including legs and underarms) for at least 48 hours prior to the first CHG shower.  You may shave your face/neck. Please follow these instructions carefully:  1.  Shower with CHG Soap the night before surgery and the  morning of Surgery.  2.  If you choose to wash your hair, wash your hair first as usual with your  normal  shampoo.  3.  After you shampoo, rinse your hair and body thoroughly to remove the  shampoo.                           4.  Use CHG as you would any other liquid soap.  You can apply chg directly  to the skin and wash  Gently with a scrungie or clean washcloth.  5.  Apply the CHG Soap to your body ONLY FROM THE NECK DOWN.   Do not use on face/ open                           Wound or open sores. Avoid contact with eyes, ears mouth and genitals (private parts).                       Wash face,  Genitals (private parts) with your normal soap.             6.  Wash thoroughly, paying special attention to the area where your surgery  will be performed.  7.  Thoroughly rinse your body with warm water from the neck down.  8.  DO NOT shower/wash with your normal soap after using and rinsing off  the CHG Soap.                9.  Pat yourself dry with a clean towel.            10.  Wear clean pajamas.            11.  Place clean sheets on your bed the night of your first shower and do not  sleep with pets. Day of Surgery : Do not apply any lotions/deodorants the morning of surgery.  Please wear clean clothes to the hospital/surgery center.  FAILURE TO FOLLOW THESE INSTRUCTIONS MAY RESULT IN THE CANCELLATION OF  YOUR SURGERY PATIENT SIGNATURE_________________________________  NURSE SIGNATURE__________________________________  ________________________________________________________________________

## 2015-03-12 NOTE — Progress Notes (Signed)
Your patient has screened at an elevated risk for Obstructive Sleep Apnea using the Stop-Bang Tool during a pre-surgical vist. A score of 4 or greater is an elevated risk. Score of 5.

## 2015-03-16 ENCOUNTER — Other Ambulatory Visit: Payer: Self-pay | Admitting: *Deleted

## 2015-03-16 ENCOUNTER — Telehealth: Payer: Self-pay | Admitting: Internal Medicine

## 2015-03-16 NOTE — Telephone Encounter (Signed)
lvm for pt regarding to May appt....mailed pt appt sched adn letter

## 2015-03-19 ENCOUNTER — Ambulatory Visit (HOSPITAL_COMMUNITY): Payer: Medicare Other

## 2015-03-19 ENCOUNTER — Ambulatory Visit (HOSPITAL_COMMUNITY): Payer: Medicare Other | Admitting: Anesthesiology

## 2015-03-19 ENCOUNTER — Encounter (HOSPITAL_COMMUNITY)
Admission: RE | Disposition: A | Discharge status: Skilled Nursing Facility | Payer: Self-pay | Source: Ambulatory Visit | Attending: Surgery

## 2015-03-19 ENCOUNTER — Encounter (HOSPITAL_COMMUNITY): Payer: Self-pay | Admitting: *Deleted

## 2015-03-19 ENCOUNTER — Observation Stay (HOSPITAL_COMMUNITY)
Admission: RE | Admit: 2015-03-19 | Discharge: 2015-03-20 | Disposition: A | Discharge status: Skilled Nursing Facility | Payer: Medicare Other | Source: Ambulatory Visit | Attending: Surgery | Admitting: Surgery

## 2015-03-19 DIAGNOSIS — C3490 Malignant neoplasm of unspecified part of unspecified bronchus or lung: Secondary | ICD-10-CM | POA: Diagnosis not present

## 2015-03-19 DIAGNOSIS — Z9841 Cataract extraction status, right eye: Secondary | ICD-10-CM | POA: Diagnosis not present

## 2015-03-19 DIAGNOSIS — K8012 Calculus of gallbladder with acute and chronic cholecystitis without obstruction: Secondary | ICD-10-CM | POA: Diagnosis not present

## 2015-03-19 DIAGNOSIS — K819 Cholecystitis, unspecified: Secondary | ICD-10-CM | POA: Diagnosis present

## 2015-03-19 DIAGNOSIS — Z885 Allergy status to narcotic agent status: Secondary | ICD-10-CM | POA: Diagnosis not present

## 2015-03-19 DIAGNOSIS — F329 Major depressive disorder, single episode, unspecified: Secondary | ICD-10-CM | POA: Insufficient documentation

## 2015-03-19 DIAGNOSIS — Z419 Encounter for procedure for purposes other than remedying health state, unspecified: Secondary | ICD-10-CM

## 2015-03-19 DIAGNOSIS — Z888 Allergy status to other drugs, medicaments and biological substances status: Secondary | ICD-10-CM | POA: Insufficient documentation

## 2015-03-19 DIAGNOSIS — Z886 Allergy status to analgesic agent status: Secondary | ICD-10-CM | POA: Insufficient documentation

## 2015-03-19 DIAGNOSIS — K810 Acute cholecystitis: Secondary | ICD-10-CM

## 2015-03-19 DIAGNOSIS — I10 Essential (primary) hypertension: Secondary | ICD-10-CM | POA: Insufficient documentation

## 2015-03-19 DIAGNOSIS — J449 Chronic obstructive pulmonary disease, unspecified: Secondary | ICD-10-CM | POA: Insufficient documentation

## 2015-03-19 DIAGNOSIS — K219 Gastro-esophageal reflux disease without esophagitis: Secondary | ICD-10-CM | POA: Diagnosis not present

## 2015-03-19 DIAGNOSIS — Z87891 Personal history of nicotine dependence: Secondary | ICD-10-CM | POA: Diagnosis not present

## 2015-03-19 DIAGNOSIS — Z9221 Personal history of antineoplastic chemotherapy: Secondary | ICD-10-CM | POA: Insufficient documentation

## 2015-03-19 DIAGNOSIS — Z9981 Dependence on supplemental oxygen: Secondary | ICD-10-CM | POA: Diagnosis not present

## 2015-03-19 DIAGNOSIS — K8010 Calculus of gallbladder with chronic cholecystitis without obstruction: Secondary | ICD-10-CM | POA: Diagnosis present

## 2015-03-19 HISTORY — PX: CHOLECYSTECTOMY: SHX55

## 2015-03-19 SURGERY — LAPAROSCOPIC CHOLECYSTECTOMY WITH INTRAOPERATIVE CHOLANGIOGRAM
Anesthesia: General | Site: Abdomen

## 2015-03-19 MED ORDER — SODIUM CHLORIDE 0.9 % IJ SOLN: 3.0000 mL | Freq: Two times a day (BID) | INTRAMUSCULAR | Status: DC

## 2015-03-19 MED ORDER — OXYCODONE HCL 5 MG/5ML PO SOLN: 5.0000 mg | Freq: Once | ORAL | Status: DC | PRN

## 2015-03-19 MED ORDER — GLYCOPYRROLATE 0.2 MG/ML IJ SOLN
INTRAMUSCULAR | Status: AC
  Filled 2015-03-19: qty 3

## 2015-03-19 MED ORDER — HYDROMORPHONE HCL 1 MG/ML IJ SOLN
0.5000 mg | INTRAMUSCULAR | Status: DC | PRN
  Administered 2015-03-19 (×2): 1 mg via INTRAVENOUS
  Administered 2015-03-20: 0.5 mg via INTRAVENOUS
  Filled 2015-03-19 (×3): qty 1

## 2015-03-19 MED ORDER — MENTHOL 3 MG MT LOZG: 1.0000 | LOZENGE | OROMUCOSAL | Status: DC | PRN

## 2015-03-19 MED ORDER — PROMETHAZINE HCL 25 MG/ML IJ SOLN
6.2500 mg | INTRAMUSCULAR | Status: DC | PRN
Start: 2015-03-19 — End: 2015-03-19

## 2015-03-19 MED ORDER — HYDROMORPHONE HCL 1 MG/ML IJ SOLN
INTRAMUSCULAR | Status: AC
  Filled 2015-03-19: qty 1

## 2015-03-19 MED ORDER — ALBUTEROL SULFATE (2.5 MG/3ML) 0.083% IN NEBU: 2.5000 mg | INHALATION_SOLUTION | Freq: Four times a day (QID) | RESPIRATORY_TRACT | Status: DC | PRN

## 2015-03-19 MED ORDER — ONDANSETRON HCL 4 MG/2ML IJ SOLN
INTRAMUSCULAR | Status: DC | PRN
  Administered 2015-03-19: 4 mg via INTRAVENOUS

## 2015-03-19 MED ORDER — OXYCODONE HCL 5 MG PO TABS
10.0000 mg | ORAL_TABLET | ORAL | Status: DC | PRN
Start: 2015-03-19 — End: 2015-03-20
  Administered 2015-03-19 – 2015-03-20 (×4): 10 mg via ORAL
  Filled 2015-03-19 (×4): qty 2

## 2015-03-19 MED ORDER — DEXAMETHASONE SODIUM PHOSPHATE 10 MG/ML IJ SOLN
INTRAMUSCULAR | Status: AC
Start: 2015-03-19 — End: 2015-03-19
  Filled 2015-03-19: qty 1

## 2015-03-19 MED ORDER — FLUTICASONE PROPIONATE 50 MCG/ACT NA SUSP
1.0000 | Freq: Every day | NASAL | Status: DC
  Administered 2015-03-20: 1 via NASAL
  Filled 2015-03-19: qty 16

## 2015-03-19 MED ORDER — LIP MEDEX EX OINT
1.0000 "application " | TOPICAL_OINTMENT | Freq: Two times a day (BID) | CUTANEOUS | Status: DC
  Administered 2015-03-19 – 2015-03-20 (×2): 1 via TOPICAL
  Filled 2015-03-19: qty 7

## 2015-03-19 MED ORDER — IOHEXOL 300 MG/ML  SOLN
INTRAMUSCULAR | Status: DC | PRN
  Administered 2015-03-19: 8 mL

## 2015-03-19 MED ORDER — CHLORHEXIDINE GLUCONATE 4 % EX LIQD: 1.0000 "application " | Freq: Once | CUTANEOUS | Status: DC

## 2015-03-19 MED ORDER — PROPOFOL 10 MG/ML IV BOLUS
INTRAVENOUS | Status: DC | PRN
  Administered 2015-03-19: 150 mg via INTRAVENOUS

## 2015-03-19 MED ORDER — ALUM & MAG HYDROXIDE-SIMETH 200-200-20 MG/5ML PO SUSP: 30.0000 mL | Freq: Four times a day (QID) | ORAL | Status: DC | PRN

## 2015-03-19 MED ORDER — SODIUM CHLORIDE 0.9 % IJ SOLN: 3.0000 mL | INTRAMUSCULAR | Status: DC | PRN

## 2015-03-19 MED ORDER — OXYCODONE HCL 15 MG PO TABS
15.0000 mg | ORAL_TABLET | ORAL | Status: AC | PRN
Start: 2015-03-19 — End: ?

## 2015-03-19 MED ORDER — ONDANSETRON HCL 4 MG/2ML IJ SOLN
INTRAMUSCULAR | Status: AC
Start: 2015-03-19 — End: 2015-03-19
  Filled 2015-03-19: qty 2

## 2015-03-19 MED ORDER — CEFAZOLIN SODIUM-DEXTROSE 2-3 GM-% IV SOLR
INTRAVENOUS | Status: AC
  Filled 2015-03-19: qty 50

## 2015-03-19 MED ORDER — NEOSTIGMINE METHYLSULFATE 10 MG/10ML IV SOLN
INTRAVENOUS | Status: DC | PRN
  Administered 2015-03-19: 5 mg via INTRAVENOUS

## 2015-03-19 MED ORDER — ROCURONIUM BROMIDE 100 MG/10ML IV SOLN
INTRAVENOUS | Status: DC | PRN
  Administered 2015-03-19: 10 mg via INTRAVENOUS
  Administered 2015-03-19: 50 mg via INTRAVENOUS

## 2015-03-19 MED ORDER — METOPROLOL SUCCINATE ER 100 MG PO TB24
100.0000 mg | ORAL_TABLET | Freq: Every day | ORAL | Status: DC
  Administered 2015-03-20: 100 mg via ORAL
  Filled 2015-03-19: qty 1

## 2015-03-19 MED ORDER — CEFAZOLIN SODIUM-DEXTROSE 2-3 GM-% IV SOLR
2.0000 g | INTRAVENOUS | Status: AC
  Administered 2015-03-19: 2 g via INTRAVENOUS

## 2015-03-19 MED ORDER — PROMETHAZINE HCL 25 MG/ML IJ SOLN: 6.2500 mg | INTRAMUSCULAR | Status: DC | PRN

## 2015-03-19 MED ORDER — LIDOCAINE HCL (CARDIAC) 20 MG/ML IV SOLN
INTRAVENOUS | Status: AC
  Filled 2015-03-19: qty 5

## 2015-03-19 MED ORDER — PROPOFOL 10 MG/ML IV BOLUS
INTRAVENOUS | Status: AC
  Filled 2015-03-19: qty 20

## 2015-03-19 MED ORDER — BUPIVACAINE-EPINEPHRINE 0.25% -1:200000 IJ SOLN
INTRAMUSCULAR | Status: DC | PRN
  Administered 2015-03-19: 65 mL

## 2015-03-19 MED ORDER — HYDROMORPHONE HCL 1 MG/ML IJ SOLN
0.2500 mg | INTRAMUSCULAR | Status: DC | PRN
  Administered 2015-03-19 (×4): 0.5 mg via INTRAVENOUS

## 2015-03-19 MED ORDER — LIDOCAINE HCL (CARDIAC) 20 MG/ML IV SOLN
INTRAVENOUS | Status: DC | PRN
  Administered 2015-03-19: 100 mg via INTRAVENOUS

## 2015-03-19 MED ORDER — BUPIVACAINE-EPINEPHRINE 0.25% -1:200000 IJ SOLN
INTRAMUSCULAR | Status: AC
  Filled 2015-03-19: qty 1

## 2015-03-19 MED ORDER — FENTANYL CITRATE (PF) 250 MCG/5ML IJ SOLN
INTRAMUSCULAR | Status: AC
  Filled 2015-03-19: qty 5

## 2015-03-19 MED ORDER — OXYCODONE HCL 5 MG PO TABS: 5.0000 mg | ORAL_TABLET | Freq: Once | ORAL | Status: DC | PRN

## 2015-03-19 MED ORDER — FOLIC ACID 1 MG PO TABS
1.0000 mg | ORAL_TABLET | Freq: Every day | ORAL | Status: DC
  Administered 2015-03-19 – 2015-03-20 (×2): 1 mg via ORAL
  Filled 2015-03-19 (×2): qty 1

## 2015-03-19 MED ORDER — MAGIC MOUTHWASH
15.0000 mL | Freq: Four times a day (QID) | ORAL | Status: DC | PRN
  Filled 2015-03-19: qty 15

## 2015-03-19 MED ORDER — SODIUM CHLORIDE 0.9 % IV SOLN: 250.0000 mL | INTRAVENOUS | Status: DC | PRN

## 2015-03-19 MED ORDER — ACETAMINOPHEN 650 MG RE SUPP: 650.0000 mg | Freq: Four times a day (QID) | RECTAL | Status: DC | PRN

## 2015-03-19 MED ORDER — 0.9 % SODIUM CHLORIDE (POUR BTL) OPTIME
TOPICAL | Status: DC | PRN
  Administered 2015-03-19: 1000 mL

## 2015-03-19 MED ORDER — METRONIDAZOLE IN NACL 5-0.79 MG/ML-% IV SOLN
500.0000 mg | INTRAVENOUS | Status: AC
  Administered 2015-03-19: 500 mg via INTRAVENOUS
  Filled 2015-03-19: qty 100

## 2015-03-19 MED ORDER — LATANOPROST 0.005 % OP SOLN
1.0000 [drp] | Freq: Every day | OPHTHALMIC | Status: DC
  Administered 2015-03-19: 1 [drp] via OPHTHALMIC
  Filled 2015-03-19: qty 2.5

## 2015-03-19 MED ORDER — FENTANYL CITRATE (PF) 100 MCG/2ML IJ SOLN
INTRAMUSCULAR | Status: DC | PRN
  Administered 2015-03-19 (×3): 50 ug via INTRAVENOUS
  Administered 2015-03-19: 100 ug via INTRAVENOUS

## 2015-03-19 MED ORDER — MIDAZOLAM HCL 5 MG/5ML IJ SOLN
INTRAMUSCULAR | Status: DC | PRN
  Administered 2015-03-19: 2 mg via INTRAVENOUS

## 2015-03-19 MED ORDER — LACTATED RINGERS IR SOLN
Status: DC | PRN
  Administered 2015-03-19: 1000 mL

## 2015-03-19 MED ORDER — LACTATED RINGERS IV SOLN
INTRAVENOUS | Status: DC
  Administered 2015-03-20: 01:00:00 via INTRAVENOUS

## 2015-03-19 MED ORDER — MIDAZOLAM HCL 2 MG/2ML IJ SOLN
INTRAMUSCULAR | Status: AC
  Filled 2015-03-19: qty 2

## 2015-03-19 MED ORDER — DEXAMETHASONE SODIUM PHOSPHATE 10 MG/ML IJ SOLN
INTRAMUSCULAR | Status: DC | PRN
  Administered 2015-03-19: 10 mg via INTRAVENOUS

## 2015-03-19 MED ORDER — GLYCOPYRROLATE 0.2 MG/ML IJ SOLN
INTRAMUSCULAR | Status: DC | PRN
  Administered 2015-03-19: 0.6 mg via INTRAVENOUS
  Administered 2015-03-19: 0.2 mg via INTRAVENOUS

## 2015-03-19 MED ORDER — ROCURONIUM BROMIDE 100 MG/10ML IV SOLN
INTRAVENOUS | Status: AC
  Filled 2015-03-19: qty 1

## 2015-03-19 MED ORDER — HEPARIN SODIUM (PORCINE) 5000 UNIT/ML IJ SOLN
5000.0000 [IU] | Freq: Three times a day (TID) | INTRAMUSCULAR | Status: DC
  Administered 2015-03-20: 5000 [IU] via SUBCUTANEOUS
  Filled 2015-03-19 (×4): qty 1

## 2015-03-19 MED ORDER — ACETAMINOPHEN 325 MG PO TABS: 325.0000 mg | ORAL_TABLET | Freq: Four times a day (QID) | ORAL | Status: DC | PRN

## 2015-03-19 MED ORDER — LACTATED RINGERS IV SOLN
INTRAVENOUS | Status: DC
  Administered 2015-03-19 (×3): via INTRAVENOUS

## 2015-03-19 MED ORDER — ONDANSETRON HCL 4 MG PO TABS: 8.0000 mg | ORAL_TABLET | Freq: Three times a day (TID) | ORAL | Status: DC | PRN

## 2015-03-19 MED ORDER — TAMSULOSIN HCL 0.4 MG PO CAPS
0.4000 mg | ORAL_CAPSULE | Freq: Every morning | ORAL | Status: DC
  Administered 2015-03-19 – 2015-03-20 (×2): 0.4 mg via ORAL
  Filled 2015-03-19 (×2): qty 1

## 2015-03-19 MED ORDER — NEOSTIGMINE METHYLSULFATE 10 MG/10ML IV SOLN
INTRAVENOUS | Status: AC
  Filled 2015-03-19: qty 1

## 2015-03-19 MED ORDER — STERILE WATER FOR IRRIGATION IR SOLN
Status: DC | PRN
  Administered 2015-03-19: 1500 mL

## 2015-03-19 MED ORDER — ESCITALOPRAM OXALATE 20 MG PO TABS
20.0000 mg | ORAL_TABLET | Freq: Every day | ORAL | Status: DC
  Administered 2015-03-19 – 2015-03-20 (×2): 20 mg via ORAL
  Filled 2015-03-19 (×2): qty 1

## 2015-03-19 MED ORDER — EPHEDRINE SULFATE 50 MG/ML IJ SOLN
INTRAMUSCULAR | Status: DC | PRN
  Administered 2015-03-19: 5 mg via INTRAVENOUS
  Administered 2015-03-19: 10 mg via INTRAVENOUS
  Administered 2015-03-19: 5 mg via INTRAVENOUS

## 2015-03-19 MED ORDER — PANTOPRAZOLE SODIUM 40 MG PO TBEC
40.0000 mg | DELAYED_RELEASE_TABLET | Freq: Every day | ORAL | Status: DC
Start: 2015-03-20 — End: 2015-03-20
  Administered 2015-03-20: 40 mg via ORAL
  Filled 2015-03-19: qty 1

## 2015-03-19 MED ORDER — ALBUTEROL SULFATE HFA 108 (90 BASE) MCG/ACT IN AERS: 1.0000 | INHALATION_SPRAY | Freq: Four times a day (QID) | RESPIRATORY_TRACT | Status: DC | PRN

## 2015-03-19 MED ORDER — DIPHENHYDRAMINE HCL 50 MG/ML IJ SOLN: 12.5000 mg | Freq: Four times a day (QID) | INTRAMUSCULAR | Status: DC | PRN

## 2015-03-19 MED ORDER — GLYCOPYRROLATE 0.2 MG/ML IJ SOLN
INTRAMUSCULAR | Status: AC
  Filled 2015-03-19: qty 1

## 2015-03-19 MED ORDER — PHENOL 1.4 % MT LIQD: 2.0000 | OROMUCOSAL | Status: DC | PRN

## 2015-03-19 MED ORDER — LACTATED RINGERS IV BOLUS (SEPSIS): 1000.0000 mL | Freq: Three times a day (TID) | INTRAVENOUS | Status: DC | PRN

## 2015-03-19 SURGICAL SUPPLY — 34 items
APPLIER CLIP 5 13 M/L LIGAMAX5 (MISCELLANEOUS)
APPLIER CLIP ROT 10 11.4 M/L (STAPLE) ×3
CABLE HIGH FREQUENCY MONO STRZ (ELECTRODE) IMPLANT
CLIP APPLIE 5 13 M/L LIGAMAX5 (MISCELLANEOUS) IMPLANT
CLIP APPLIE ROT 10 11.4 M/L (STAPLE) ×1 IMPLANT
COVER MAYO STAND STRL (DRAPES) ×3 IMPLANT
DECANTER SPIKE VIAL GLASS SM (MISCELLANEOUS) ×3 IMPLANT
DRAPE C-ARM 42X120 X-RAY (DRAPES) ×3 IMPLANT
DRAPE LAPAROSCOPIC ABDOMINAL (DRAPES) ×3 IMPLANT
DRAPE UTILITY XL STRL (DRAPES) ×3 IMPLANT
DRAPE WARM FLUID 44X44 (DRAPE) ×3 IMPLANT
DRSG TEGADERM 2-3/8X2-3/4 SM (GAUZE/BANDAGES/DRESSINGS) ×9 IMPLANT
DRSG TEGADERM 4X4.75 (GAUZE/BANDAGES/DRESSINGS) ×3 IMPLANT
ELECT REM PT RETURN 9FT ADLT (ELECTROSURGICAL) ×3
ELECTRODE REM PT RTRN 9FT ADLT (ELECTROSURGICAL) ×1 IMPLANT
ENDOLOOP SUT PDS II  0 18 (SUTURE)
ENDOLOOP SUT PDS II 0 18 (SUTURE) IMPLANT
GLOVE ECLIPSE 8.0 STRL XLNG CF (GLOVE) ×3 IMPLANT
GLOVE INDICATOR 8.0 STRL GRN (GLOVE) ×3 IMPLANT
GOWN STRL REUS W/TWL XL LVL3 (GOWN DISPOSABLE) ×6 IMPLANT
KIT BASIN OR (CUSTOM PROCEDURE TRAY) ×3 IMPLANT
POUCH SPECIMEN RETRIEVAL 10MM (ENDOMECHANICALS) ×3 IMPLANT
SCISSORS LAP 5X35 DISP (ENDOMECHANICALS) ×3 IMPLANT
SET CHOLANGIOGRAPH MIX (MISCELLANEOUS) ×3 IMPLANT
SET IRRIG TUBING LAPAROSCOPIC (IRRIGATION / IRRIGATOR) ×3 IMPLANT
SLEEVE XCEL OPT CAN 5 100 (ENDOMECHANICALS) IMPLANT
SUT MNCRL AB 4-0 PS2 18 (SUTURE) ×3 IMPLANT
SUT PDS AB 1 CT1 27 (SUTURE) ×6 IMPLANT
SYR 20CC LL (SYRINGE) ×3 IMPLANT
TOWEL OR 17X26 10 PK STRL BLUE (TOWEL DISPOSABLE) ×3 IMPLANT
TOWEL OR NON WOVEN STRL DISP B (DISPOSABLE) ×3 IMPLANT
TRAY LAPAROSCOPIC (CUSTOM PROCEDURE TRAY) ×3 IMPLANT
TROCAR BLADELESS OPT 5 100 (ENDOMECHANICALS) ×3 IMPLANT
TROCAR XCEL NON-BLD 11X100MML (ENDOMECHANICALS) ×3 IMPLANT

## 2015-03-19 NOTE — Discharge Instructions (Signed)
LAPAROSCOPIC SURGERY: POST OP INSTRUCTIONS ° °1. DIET: Follow a light bland diet the first 24 hours after arrival home, such as soup, liquids, crackers, etc.  Be sure to include lots of fluids daily.  Avoid fast food or heavy meals as your are more likely to get nauseated.  Eat a low fat the next few days after surgery.   °2. Take your usually prescribed home medications unless otherwise directed. °3. PAIN CONTROL: °a. Pain is best controlled by a usual combination of three different methods TOGETHER: °i. Ice/Heat °ii. Over the counter pain medication °iii. Prescription pain medication °b. Most patients will experience some swelling and bruising around the incisions.  Ice packs or heating pads (30-60 minutes up to 6 times a day) will help. Use ice for the first few days to help decrease swelling and bruising, then switch to heat to help relax tight/sore spots and speed recovery.  Some people prefer to use ice alone, heat alone, alternating between ice & heat.  Experiment to what works for you.  Swelling and bruising can take several weeks to resolve.   °c. It is helpful to take an over-the-counter pain medication regularly for the first few weeks.  Choose one of the following that works best for you: °i. Naproxen (Aleve, etc)  Two 220mg tabs twice a day °ii. Ibuprofen (Advil, etc) Three 200mg tabs four times a day (every meal & bedtime) °iii. Acetaminophen (Tylenol, etc) 500-650mg four times a day (every meal & bedtime) °d. A  prescription for pain medication (such as oxycodone, hydrocodone, etc) should be given to you upon discharge.  Take your pain medication as prescribed.  °i. If you are having problems/concerns with the prescription medicine (does not control pain, nausea, vomiting, rash, itching, etc), please call us [phone] to see if we need to switch you to a different pain medicine that will work better for you and/or control your side effect better. °ii. If you need a refill on your pain medication,  please contact your pharmacy.  They will contact our office to request authorization. Prescriptions will not be filled after 5 pm or on week-ends. °4. Avoid getting constipated.  Between the surgery and the pain medications, it is common to experience some constipation.  Increasing fluid intake and taking a fiber supplement (such as Metamucil, Citrucel, FiberCon, MiraLax, etc) 1-2 times a day regularly will usually help prevent this problem from occurring.  A mild laxative (prune juice, Milk of Magnesia, MiraLax, etc) should be taken according to package directions if there are no bowel movements after 48 hours.   °5. Watch out for diarrhea.  If you have many loose bowel movements, simplify your diet to bland foods & liquids for a few days.  Stop any stool softeners and decrease your fiber supplement.  Switching to mild anti-diarrheal medications (Kayopectate, Pepto Bismol) can help.  If this worsens or does not improve, please call us. °6. Wash / shower every day.  You may shower over the dressings as they are waterproof.  Continue to shower over incision(s) after the dressing is off. °7. Remove your waterproof bandages 5 days after surgery.  You may leave the incision open to air.  You may replace a dressing/Band-Aid to cover the incision for comfort if you wish.  °8. ACTIVITIES as tolerated:   °a. You may resume regular (light) daily activities beginning the next day--such as daily self-care, walking, climbing stairs--gradually increasing activities as tolerated.  If you can walk 30 minutes without difficulty, it   is safe to try more intense activity such as jogging, treadmill, bicycling, low-impact aerobics, swimming, etc. b. Save the most intensive and strenuous activity for last such as sit-ups, heavy lifting, contact sports, etc  Refrain from any heavy lifting or straining until you are off narcotics for pain control.   c. DO NOT PUSH THROUGH PAIN.  Let pain be your guide: If it hurts to do something, don't  do it.  Pain is your body warning you to avoid that activity for another week until the pain goes down. d. You may drive when you are no longer taking prescription pain medication, you can comfortably wear a seatbelt, and you can safely maneuver your car and apply brakes. e. Dennis Bast may have sexual intercourse when it is comfortable.  9. FOLLOW UP in our office a. Please call [facility] at [phone] to set up an appointment to see your surgeon in the office for a follow-up appointment approximately 2-3 weeks after your surgery. b. Make sure that you call for this appointment the day you arrive home to insure a convenient appointment time. 10. IF YOU HAVE DISABILITY OR FAMILY LEAVE FORMS, BRING THEM TO THE OFFICE FOR PROCESSING.  DO NOT GIVE THEM TO YOUR DOCTOR.   WHEN TO CALL us [phone]: 1. Poor pain control 2. Reactions / problems with new medications (rash/itching, nausea, etc)  3. Fever over 101.5 F (38.5 C) 4. Inability to urinate 5. Nausea and/or vomiting 6. Worsening swelling or bruising 7. Continued bleeding from incision. 8. Increased pain, redness, or drainage from the incision   The clinic staff is available to answer your questions during regular business hours (8:30am-5pm).  Please dont hesitate to call and ask to speak to one of our nurses for clinical concerns.   If you have a medical emergency, go to the nearest emergency room or call 911.  A surgeon from [facility] is always on call at the [facility], [address], [place], [place], [place]  [address] ? MAIN: [phone] ? TOLL FREE: [phone] ?  FAX [phone] [email or web address]   Cholecystitis Cholecystitis is an inflammation of your gallbladder. It is usually caused by a buildup of gallstones or sludge (cholelithiasis) in your gallbladder. The gallbladder stores a fluid that helps digest fats (bile). Cholecystitis is serious and needs  treatment right away.  CAUSES   Gallstones. Gallstones can block the tube that leads to your gallbladder, causing bile to build up. As bile builds up, the gallbladder becomes inflamed.  Bile duct problems, such as blockage from scarring or kinking.  Tumors. Tumors can stop bile from leaving your gallbladder correctly, causing bile to build up. As bile builds up, the gallbladder becomes inflamed. SYMPTOMS   Nausea.  Vomiting.  Abdominal pain, especially in the upper right area of your abdomen.  Abdominal tenderness or bloating.  Sweating.  Chills.  Fever.  Yellowing of the skin and the whites of the eyes (jaundice). DIAGNOSIS  Your caregiver may order blood tests to look for infection or gallbladder problems. Your caregiver may also order imaging tests, such as an ultrasound or computed tomography (CT) scan. Further tests may include a hepatobiliary iminodiacetic acid (HIDA) scan. This scan allows your caregiver to see your bile move from the liver to the gallbladder and to the small intestine. TREATMENT  A hospital stay is usually necessary to lessen the inflammation of your gallbladder. You may be required to not eat or drink (fast) for  a certain amount of time. You may be given medicine to treat pain or an antibiotic medicine to treat an infection. Surgery may be needed to remove your gallbladder (cholecystectomy) once the inflammation has gone down. Surgery may be needed right away if you develop complications such as death of gallbladder tissue (gangrene) or a tear (perforation) of the gallbladder.  [facility] will depend on your treatment. In general:  If you were given antibiotics, take them as directed. Finish them even if you start to feel better.  Only take over-the-counter or prescription medicines for pain, discomfort, or fever as directed by your caregiver.  Follow a low-fat diet until you see your caregiver again.  Keep all follow-up visits as  directed by your caregiver. SEEK IMMEDIATE MEDICAL CARE IF:   Your pain is increasing and not controlled by medicines.  Your pain moves to another part of your abdomen or to your back.  You have a fever.  You have nausea and vomiting. MAKE SURE YOU:  Understand these instructions.  Will watch your condition.  Will get help right away if you are not doing well or get worse. Document Released: 10/17/2005 Document Revised: 01/09/2012 Document Reviewed: 09/02/2011 [facility] Patient Information 2015 [place], [place]. This information is not intended to replace advice given to you by your health care provider. Make sure you discuss any questions you have with your health care provider.  Managing Pain  Pain after surgery or related to activity is often due to strain/injury to muscle, tendon, nerves and/or incisions.  This pain is usually short-term and will improve in a few months.   Many people find it helpful to do the following things TOGETHER to help speed the process of healing and to get back to regular activity more quickly:  1. Avoid heavy physical activity at first a. No lifting greater than 20 pounds at first, then increase to lifting as tolerated over the next few weeks b. Do not push through the pain.  Listen to your body and avoid positions and maneuvers than reproduce the pain.  Wait a few days before trying something more intense c. Walking is okay as tolerated, but go slowly and stop when getting sore.  If you can walk 30 minutes without stopping or pain, you can try more intense activity (running, jogging, aerobics, cycling, swimming, treadmill, sex, sports, weightlifting, etc ) d. Remember: If it hurts to do it, then dont do it!  2. Take Anti-inflammatory medication a. Choose ONE of the following over-the-counter medications: i.            Acetaminophen '500mg'$  tabs (Tylenol) 1-2 pills with every meal and just before bedtime (avoid if you have liver problems) ii.             Naproxen '220mg'$  tabs (ex. Aleve) 1-2 pills twice a day (avoid if you have kidney, stomach, IBD, or bleeding problems) iii. Ibuprofen '200mg'$  tabs (ex. Advil, Motrin) 3-4 pills with every meal and just before bedtime (avoid if you have kidney, stomach, IBD, or bleeding problems) b. Take with food/snack around the clock for 1-2 weeks i. This helps the muscle and nerve tissues become less irritable and calm down faster  3. Use a Heating pad or Ice/Cold Pack a. 4-6 times a day b. May use warm bath/hottub  or showers  4. Try Gentle Massage and/or Stretching  a. at the area of pain many times a day b. stop if you feel pain - do not overdo it  Try these steps together to help you body heal faster and avoid making things get worse.  Doing just one of these things may not be enough.    If you are not getting better after two weeks or are noticing you are getting worse, contact our office for further advice; we may need to re-evaluate you & see what other things we can do to help.  GETTING TO GOOD BOWEL HEALTH. Irregular bowel habits such as constipation and diarrhea can lead to many problems over time.  Having one soft bowel movement a day is the most important way to prevent further problems.  The anorectal canal is designed to handle stretching and feces to safely manage our ability to get rid of solid waste (feces, poop, stool) out of our body.  BUT, hard constipated stools can act like ripping concrete bricks and diarrhea can be a burning fire to this very sensitive area of our body, causing inflamed hemorrhoids, anal fissures, increasing risk is perirectal abscesses, abdominal pain/bloating, an making irritable bowel worse.     The goal: ONE SOFT BOWEL MOVEMENT A DAY!  To have soft, regular bowel movements:   Drink at least 8 tall glasses of water a day.    Take plenty of fiber.  Fiber is the undigested part of plant food that passes into the colon, acting s natures broom to encourage bowel  motility and movement.  Fiber can absorb and hold large amounts of water. This results in a larger, bulkier stool, which is soft and easier to pass. Work gradually over several weeks up to 6 servings a day of fiber (25g a day even more if needed) in the form of: o Vegetables -- Root (potatoes, carrots, turnips), leafy green (lettuce, salad greens, celery, spinach), or cooked high residue (cabbage, broccoli, etc) o Fruit -- Fresh (unpeeled skin & pulp), Dried (prunes, apricots, cherries, etc ),  or stewed ( applesauce)  o Whole grain breads, pasta, etc (whole wheat)  o Bran cereals   Bulking Agents -- This type of water-retaining fiber generally is easily obtained each day by one of the following:  o Psyllium bran -- The psyllium plant is remarkable because its ground seeds can retain so much water. This product is available as Metamucil, Konsyl, Effersyllium, Per Diem Fiber, or the less expensive generic preparation in drug and health food stores. Although labeled a laxative, it really is not a laxative.  o Methylcellulose -- This is another fiber derived from wood which also retains water. It is available as Citrucel. o Polyethylene Glycol - and artificial fiber commonly called Miralax or Glycolax.  It is helpful for people with gassy or bloated feelings with regular fiber o Flax Seed - a less gassy fiber than psyllium  No reading or other relaxing activity while on the toilet. If bowel movements take longer than 5 minutes, you are too constipated  AVOID CONSTIPATION.  High fiber and water intake usually takes care of this.  Sometimes a laxative is needed to stimulate more frequent bowel movements, but   Laxatives are not a good long-term solution as it can wear the colon out. o Osmotics (Milk of Magnesia, Fleets phosphosoda, Magnesium citrate, MiraLax, GoLytely) are safer than  o Stimulants (Senokot, Castor Oil, Dulcolax, Ex Lax)    o Do not take laxatives for more than 7days in a row.   IF  SEVERELY CONSTIPATED, try a Bowel Retraining Program: o Do not use laxatives.  o Eat a diet high in roughage, such  as bran cereals and leafy vegetables.  o Drink six (6) ounces of prune or apricot juice each morning.  o Eat two (2) large servings of stewed fruit each day.  o Take one (1) heaping tablespoon of a psyllium-based bulking agent twice a day. Use sugar-free sweetener when possible to avoid excessive calories.  o Eat a normal breakfast.  o Set aside 15 minutes after breakfast to sit on the toilet, but do not strain to have a bowel movement.  o If you do not have a bowel movement by the third day, use an enema and repeat the above steps.   Controlling diarrhea o Switch to liquids and simpler foods for a few days to avoid stressing your intestines further. o Avoid dairy products (especially milk & ice cream) for a short time.  The intestines often can lose the ability to digest lactose when stressed. o Avoid foods that cause gassiness or bloating.  Typical foods include beans and other legumes, cabbage, broccoli, and dairy foods.  Every person has some sensitivity to other foods, so listen to our body and avoid those foods that trigger problems for you. o Adding fiber (Citrucel, Metamucil, psyllium, Miralax) gradually can help thicken stools by absorbing excess fluid and retrain the intestines to act more normally.  Slowly increase the dose over a few weeks.  Too much fiber too soon can backfire and cause cramping & bloating. o Probiotics (such as active yogurt, Align, etc) may help repopulate the intestines and colon with normal bacteria and calm down a sensitive digestive tract.  Most studies show it to be of mild help, though, and such products can be costly. o Medicines: - Bismuth subsalicylate (ex. Kayopectate, Pepto Bismol) every 30 minutes for up to 6 doses can help control diarrhea.  Avoid if pregnant. - Loperamide (Immodium) can slow down diarrhea.  Start with two tablets ('4mg'$   total) first and then try one tablet every 6 hours.  Avoid if you are having fevers or severe pain.  If you are not better or start feeling worse, stop all medicines and call your doctor for advice o Call your doctor if you are getting worse or not better.  Sometimes further testing (cultures, endoscopy, X-ray studies, bloodwork, etc) may be needed to help diagnose and treat the cause of the diarrhea. o

## 2015-03-19 NOTE — H&P (Signed)
Jori Moll Otten 02/03/2015 4:32 PM Location: [facility] Patient #: [id number] DOB: 1941-02-28 Single / Language: [name] / Race: White Male  History of Present Illness [name] MD; 02/03/2015 5:21 PM) Patient words: reck.  The patient is a 74 year old male who presents for evaluation of gall stones. Patient returns from recent hospital admission with acute cholecystitis. On chronic chemotherapy for lung cancer. Numerous other health issues. Came in with pneumonia. We felt he was to decondition to tolerate urgent cholecystectomy. Therefore percutaneous cholecystostomy tube drainage done. Eventually discharged. Still has chronic soreness in the RIGHT upper quadrant markedly worse. They're markedly better either. Bowel movement about every other day. Still with drainage. Hungry but doesn't medicinally enjoy the nursing home food. Patient does not like to. Worried about recurrent attacks. Really wants to get rid of drain at some point. His open to the idea of surgery.   Other Problems [name], CMA; 02/03/2015 4:33 PM) Anxiety Disorder Arthritis Back Pain Chest pain Chronic Obstructive Lung Disease Depression Emphysema Of Lung Gastroesophageal Reflux Disease High blood pressure Home Oxygen Use Lung Cancer  Past Surgical History [name], CMA; 02/03/2015 4:33 PM) Cataract Surgery Right. Foot Surgery Right.  Diagnostic Studies History [name], CMA; 02/03/2015 4:33 PM) Colonoscopy never  Allergies [name], CMA; 02/03/2015 4:36 PM) Ketorolac *ANALGESICS - ANTI-INFLAMMATORY* Codeine Phosphate *ANALGESICS - OPIOID* Morphine Sulfate (Concentrate) *ANALGESICS - OPIOID* TraMADol HCl *ANALGESICS - OPIOID* Tussionex Pennkinetic ER *COUGH/COLD/ALLERGY*  Medication History ([name], CMA; 02/03/2015 4:34 PM) ALPRAZolam ('1MG'$  Tablet, Oral) Active. Amitriptyline HCl ('10MG'$  Tablet, Oral) Active. Bystolic ('10MG'$  Tablet, Oral)  Active. Dexamethasone ('4MG'$  Tablet, Oral) Active. Escitalopram Oxalate ('20MG'$  Tablet, Oral) Active. Folic Acid ('1MG'$  Tablet, Oral) Active. Lumigan (0.01% Solution, Ophthalmic) Active. Naproxen ('500MG'$  Tablet, Oral) Active. Omeprazole ('20MG'$  Capsule DR, Oral) Active. Prochlorperazine Maleate ('10MG'$  Tablet, Oral) Active. Tamsulosin HCl (0.'4MG'$  Capsule, Oral) Active. Naproxen ('250MG'$  Tablet, Oral) Active. Medications Reconciled  Social History [name], CMA; 02/03/2015 4:33 PM) Alcohol use Moderate alcohol use. Caffeine use Coffee. Tobacco use Former smoker.  Family History [name], CMA; 02/03/2015 4:33 PM) Diabetes Mellitus Mother, Sister. Heart Disease Sister. Hypertension Sister.  Review of Systems ([place]; 02/03/2015 4:33 PM) General Present- Fatigue. Not Present- Appetite Loss, Chills, Fever, Night Sweats, Weight Gain and Weight Loss. HEENT Present- Sinus Pain, Visual Disturbances and Wears glasses/contact lenses. Not Present- Earache, Hearing Loss, Hoarseness, Nose Bleed, Oral Ulcers, Ringing in the Ears, Seasonal Allergies, Sore Throat and Yellow Eyes. Respiratory Present- Difficulty Breathing and Wheezing. Not Present- Bloody sputum, Chronic Cough and Snoring. Cardiovascular Present- Chest Pain and Shortness of Breath. Not Present- Difficulty Breathing Lying Down, Leg Cramps, Palpitations, Rapid Heart Rate and Swelling of Extremities. Gastrointestinal Present- Gets full quickly at meals and Indigestion. Not Present- Abdominal Pain, Bloating, Bloody Stool, Change in Bowel Habits, Chronic diarrhea, Constipation, Difficulty Swallowing, Excessive gas, Hemorrhoids, Nausea, Rectal Pain and Vomiting. Male Genitourinary Present- Frequency. Not Present- Blood in Urine, Change in Urinary Stream, Impotence, Nocturia, Painful Urination, Urgency and Urine Leakage.   Vitals ([name] CMA; 02/03/2015 4:33 PM) 02/03/2015 4:33 PM Weight: 222 lb Height: 74in Body Surface  Area: 2.29 m Body Mass Index: 28.5 kg/m Temp.: 97.53F(Temporal)  Pulse: 77 (Regular)  BP: 132/80 (Sitting, Left Arm, Standard)    Physical Exam [name] MD; 02/03/2015 5:22 PM) General Mental Status-Alert. General Appearance-Not in acute distress. Voice-Normal.  Integumentary Global Assessment Upon inspection and palpation of skin surfaces of the - Distribution of scalp and body hair is normal.  General Characteristics Overall examination of the patient's skin reveals - no rashes and no suspicious lesions.  Head and Neck Head-normocephalic, atraumatic with no lesions or palpable masses. Face Global Assessment - atraumatic, no absence of expression. Neck Global Assessment - no abnormal movements, no decreased range of motion. Trachea-midline. Thyroid Gland Characteristics - non-tender.  Eye Eyeball - Left-Extraocular movements intact, No Nystagmus. Eyeball - Right-Extraocular movements intact, No Nystagmus. Upper Eyelid - Left-No Cyanotic. Upper Eyelid - Right-No Cyanotic. Note: wears glasses   ENMT Note: Oxygen 2 L nasal cannula   Chest and Lung Exam Inspection Accessory muscles - No use of accessory muscles in breathing. Note: Decreased breath sounds at bases   Abdomen Note: Drain in RIGHT abdomen. Thinly bilious. No cellulitis. No purulence some fullness and upper abdomen but no peritonitis. No Murphy sign. No guarding. No hernias.   Peripheral Vascular Upper Extremity Inspection - Left - Not Gangrenous, No Petechiae. Right - Not Gangrenous, No Petechiae.  Neurologic Neurologic evaluation reveals -normal attention span and ability to concentrate, able to name objects and repeat phrases. Appropriate fund of knowledge and normal coordination.  Neuropsychiatric Mental status exam performed with findings of-able to articulate well with normal speech/language, rate, volume and coherence and no evidence of hallucinations,  delusions, obsessions or homicidal/suicidal ideation. Orientation-oriented X3.  Musculoskeletal Global Assessment Gait and Station - normal gait and station.  Lymphatic General Lymphatics Description - No Generalized lymphadenopathy.    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 Discussed with the patient and his wife. Standard of care is internal cholecystectomy 6?8 weeks from percutaneous placement. The hopefully minimize his chance of inflammation but is not waits along that cholangitis can develop was drained.  Would like a cardiac clearance. Maybe and pulmonary clearance. If they feel he can tolerate surgery, proceed with surgery. Patient is inclined to proceed with surgery if possible.  If extremely prohibitive operative candidate, then do cholangiography 6 weeks. If no more obstruction, remove drained and intermittently replaced drain with repeated attacks.  Last option is leave drain in for the rest of his life. He really does not like that idea.  The patient does have lung cancer but his disease has been improved with chemotherapy and is control of maintenance chemotherapy. Reasonable for laparoscopic approach. He is on oxygen and deconditioned occasionally using wheelchair but is stable. Appetite good. Improving from discharge. As long as cleared by specialists, we aggressive proceed with surgery since his risk and risk of recurrent cholecystitis his with intermittent drains only. He has numerous stones Current Plans  Schedule for Surgery The anatomy & physiology of hepatobiliary & pancreatic function was discussed. The pathophysiology of gallbladder dysfunction was discussed. Natural history risks without surgery was discussed. I feel the risks of no intervention will lead to serious problems that outweigh the operative risks; therefore, I recommended cholecystectomy to  remove the pathology. I explained laparoscopic techniques with possible need for an open approach. Probable cholangiogram to evaluate the bilary tract was explained as well.  Risks such as bleeding, infection, abscess, leak, injury to other organs, need for further treatment, heart attack, death, and other risks were discussed. I noted a good likelihood this will help address the problem. Possibility that this will not correct all abdominal symptoms was explained. Goals of post-operative recovery were discussed as well. We will work to minimize complications. An educational handout further explaining the pathology and treatment options was given as  well. Questions were answered. The patient expresses understanding & wishes to proceed with surgery. Pt Education - [facility] Laparosopic Post Op HCI (Quinntin Malter) Pt Education - [facility] Good Bowel Health (Bryelle Spiewak) Pt Education - [facility] Pain Control (Juanette Urizar)  [name], M.D., F.A.C.S. Gastrointestinal and Minimally Invasive Surgery [facility] [address] [place], [place] [address] [phone] Main / Paging

## 2015-03-19 NOTE — Interval H&P Note (Signed)
History and Physical Interval Note:  03/19/2015 10:10 AM  [name]  has presented today for surgery, with the diagnosis of Chronic Cholecystitis  The various methods of treatment have been discussed with the patient and family. After consideration of risks, benefits and other options for treatment, the patient has consented to  Procedure(s): LAPAROSCOPIC CHOLECYSTECTOMY WITH INTRAOPERATIVE CHOLANGIOGRAM (N/A) as a surgical intervention .  Cleared guardedly by cardiology & pulmonary.  The patient's history has been reviewed, patient examined, no change in status, stable for surgery.  I have reviewed the patient's chart and labs.  Questions were answered to the patient's satisfaction.     Tyler Sedberry C.

## 2015-03-19 NOTE — Transfer of Care (Signed)
Immediate Anesthesia Transfer of Care Note  Patient: Tyler [name]  Procedure(s) Performed: Procedure(s): LAPAROSCOPIC CHOLECYSTECTOMY LYSIS OF ADHESIONS WITH INTRAOPERATIVE CHOLANGIOGRAM (N/A)  Patient Location: PACU  Anesthesia Type:General  Level of Consciousness: sedated  Airway & Oxygen Therapy: Patient Spontanous Breathing and Patient connected to face mask oxygen  Post-op Assessment: Report given to RN and Post -op Vital signs reviewed and stable  Post vital signs: Reviewed and stable  Last Vitals:  Filed Vitals:   03/19/15 0740  BP: 128/52  Pulse: 50  Temp: 36.4 C  Resp: 18    Complications: No apparent anesthesia complications

## 2015-03-19 NOTE — Anesthesia Preprocedure Evaluation (Addendum)
Anesthesia Evaluation  Patient identified by MRN, date of birth, ID band Patient awake    Reviewed: Allergy & Precautions, NPO status , Patient's Chart, lab work & pertinent test results, reviewed documented beta blocker date and time   Airway Mallampati: II  TM Distance: >3 FB Neck ROM: Full    Dental   Pulmonary shortness of breath, COPD oxygen dependent, former smoker,  +lung CA receiving chemotherapy breath sounds clear to auscultation        Cardiovascular hypertension, Pt. on medications and Pt. on home beta blockers Rhythm:Regular Rate:Normal     Neuro/Psych Depression    GI/Hepatic Neg liver ROS, GERD-  ,  Endo/Other  negative endocrine ROS  Renal/GU negative Renal ROS     Musculoskeletal  (+) Arthritis -,   Abdominal   Peds  Hematology  (+) anemia ,   Anesthesia Other Findings   Reproductive/Obstetrics                            Lab Results  Component Value Date   WBC 6.7 03/12/2015   HGB 11.4* 03/12/2015   HCT 36.0* 03/12/2015   MCV 94.7 03/12/2015   PLT 192 03/12/2015   Lab Results  Component Value Date   CREATININE 0.92 03/12/2015   BUN 15 03/12/2015   NA 134* 03/12/2015   K 4.2 03/12/2015   CL 99* 03/12/2015   CO2 27 03/12/2015    Anesthesia Physical Anesthesia Plan  ASA: III  Anesthesia Plan: General   Post-op Pain Management:    Induction: Intravenous  Airway Management Planned: Oral ETT  Additional Equipment:   Intra-op Plan:   Post-operative Plan: Extubation in OR and Possible Post-op intubation/ventilation  Informed Consent: I have reviewed the patients History and Physical, chart, labs and discussed the procedure including the risks, benefits and alternatives for the proposed anesthesia with the patient or authorized representative who has indicated his/her understanding and acceptance.   Dental advisory given  Plan Discussed with:  CRNA  Anesthesia Plan Comments:         Anesthesia Quick Evaluation

## 2015-03-19 NOTE — Op Note (Addendum)
03/19/2015  3:41 PM  PATIENT:  Tyler [name]  74 y.o. male  Patient Care Team: Tamsen Roers, MD as PCP - General (Family Medicine) Curt Bears, MD as Consulting Physician (Oncology) Lelon Perla, MD as Consulting Physician (Cardiology)  PRE-OPERATIVE DIAGNOSIS:  Chronic Cholecystitis s/p percutaneous drainage  POST-OPERATIVE DIAGNOSIS:  Chronic Cholecystitis  PROCEDURE:  Procedure(s): LAPAROSCOPIC CHOLECYSTECTOMY WITH INTRAOPERATIVE CHOLANGIOGRAM LYSIS OF ADHESIONS   SURGEON:  Surgeon(s): Michael Boston, MD  ASSISTANT: RN   ANESTHESIA:   local and general  EBL:  Total I/O In: 1700 [I.V.:1700] Out: -   Delay start of Pharmacological VTE agent (>24hrs) due to surgical blood loss or risk of bleeding:  no  DRAINS: none   SPECIMEN:  Source of Specimen:  Gallbladder with stones  DISPOSITION OF SPECIMEN:  PATHOLOGY  COUNTS:  YES  PLAN OF CARE: Admit for overnight observation  PATIENT DISPOSITION:  PACU - hemodynamically stable.  INDICATION: pleasant noted a gentleman with lung cancer and oxygen dependence.  Found to have acute cholecystitis.  Felt not to be stable for emergency surgery.  Underwent percutaneous drainage.  Stabilized.  Group.  Recommendation made for cholecystectomy.  Guardedly cleared by cardiology and pulmonary.  He was to be aggressive and proceed with surgery to per and further attacks  The anatomy & physiology of hepatobiliary & pancreatic function was discussed.  The pathophysiology of gallbladder dysfunction was discussed.  Natural history risks without surgery was discussed.   I feel the risks of no intervention will lead to serious problems that outweigh the operative risks; therefore, I recommended cholecystectomy to remove the pathology.  I explained laparoscopic techniques with possible need for an open approach.  Probable cholangiogram to evaluate the bilary tract was explained as well.    Risks such as bleeding, infection, abscess, leak,  injury to other organs, need for further treatment, heart attack, death, and other risks were discussed.  I noted a good likelihood this will help address the problem.  Possibility that this will not correct all abdominal symptoms was explained.  Goals of post-operative recovery were discussed as well.  We will work to minimize complications.  An educational handout further explaining the pathology and treatment options was given as well.  Questions were answered.  The patient expresses understanding & wishes to proceed with surgery.  OR FINDINGS:  Perc pigtail drain going into gallbladder.  No more abscess.  Very thick and hard scar of gallbladder wall.  Rather poor planes consistent with recurrent and chronic inflammation.  DESCRIPTION:   The patient was identified & brought into the operating room. The patient was positioned supine with arms tucked. SCDs were active during the entire case. The patient underwent general anesthesia without any difficulty.  The abdomen was prepped and draped in a sterile fashion. A Surgical Timeout confirmed our plan.  We positioned the patient in reverse [place] & right side up.   I placed a 70m laparoscopic port through the supraumbilical abdominal wall using a modified Hassan cutdown technique with optical entry.  Entry was clean.  We induced carbon dioxide insufflation. Camera inspection revealed no injury.  There were no adhesions to the anterior abdominal wall supraumbilically.  I proceeded to continue with laparoscopic technique. I placed a #5 port in supraumbilical region, another 570mport in the right flank near the anterior axillary line, and a 101mort in the left subxiphoid region obliquely within the falciform ligament.  I turned attention to the right upper quadrant.  The gallbladder fundus was elevated  cephalad.   Patient had some dense omental, mesial colon, duodenal adhesions to the gallbladder. These were carefully freed off.  I used hook cautery  to free the peritoneal coverings between the gallbladder and the liver on the posteriolateral and anteriomedial walls.   I used careful blunt and hook dissection to help get a good critical view of the cystic artery and cystic duct. I mainly focused on theright posterior lateral walland end up freeing most the gallbladder off the liver bed and sort of lateral to medial dome down fashion.   He had hard scarring on the left medial side to the liver.  It up-to get into the gallbladder fossa of the liver to get a cleaner plane. Did have breaches in the gallbladder wall with some spillage of stones.  Eventually I was able to getalmost the entirety of the gallbladder off the liver bed to get a good critical view of the infundibulum and cystic duct. The infundibulum was folded opposite to lay more near the falciform ligament.    I mobilized the anterior and posterior branches of the cystic artery.  It seems enlarged lymph node of colon.  Skeletonized the lymphatics there.  After getting a good 360 view, ligated the cystic artery branches & lymphatic using clips.  This left only one tubular structure going down to the porta hepatis and common bile duct.  I skeletonized the cystic duct.  i placed clips above and below it.  I did a partial cystic duct-otomy and ensured That it was the cystic duct with bile. It had narrowing but did have patency. I review of the clips on the proximal end.  I placed a 5 [place] cholangiocatheter through a puncture site at the right subcostal ridge of the abdominal wall and directed it into the cystic duct.  We ran a cholangiogram with dilute radio-opaque contrast and continuous fluoroscopy. Contrast flowed from a side branch consistent with cystic duct cannulization. Contrast flowed up the common hepatic duct into the right and left intrahepatic chains out to secondary radicals. Contrast flowed down the common bile duct easily across the normal ampulla into the duodenum.  The bili or system  was somewhat dilated but flowed easily.  There was some thickened syrupy material in the distal common bile duct but no definite stones.  Flushed out very easily.  This was consistent with a normal cholangiogram.  I removed the cholangiocatheter. I placed clips on the cystic duct x4.  I completed cystic duct transection. I freed the gallbladder from its remaining attachments to the liver. I ensured hemostasis on the gallbladder fossa of the liver and elsewhere. I inspected the rest of the abdomen & detected no injury nor bleeding elsewhere.  I removed the gallbladder. I removed the spilled stones as well.  I had done serial copiousirrigation.  Hemostasis was good for 10 minutes.  I closed the subxiphoid fascia transversely using #1 PDS interrupted stitches.  I closed the skin using 4-0 monocryl stitch.  Sterile dressings were applied. The patient was extubated & arrived in the PACU in stable condition..  I had discussed postoperative care with the patient in the holding area.  I did locate the patient's family and discuss operative findings and postoperative goals / instructions.  Instructions are written in the chart as well.  We will watch the patient overnight.  He should be able return to [facility] skilled nursing facility tomorrow.  [name], M.D., F.A.C.S. Gastrointestinal and Minimally Invasive Surgery [facility] [address] [place], [place] [address] [phone] Main / Paging

## 2015-03-19 NOTE — Anesthesia Procedure Notes (Signed)
Procedure Name: Intubation Date/Time: 03/19/2015 11:07 AM Performed by: Lollie Sails Pre-anesthesia Checklist: Patient identified, Emergency Drugs available, Suction available, Patient being monitored and Timeout performed Patient Re-evaluated:Patient Re-evaluated prior to inductionOxygen Delivery Method: Circle system utilized Preoxygenation: Pre-oxygenation with 100% oxygen Intubation Type: IV induction Ventilation: Mask ventilation without difficulty Laryngoscope Size: Miller and 3 Grade View: Grade I Tube type: Oral Tube size: 7.5 mm Number of attempts: 1 Airway Equipment and Method: Stylet and Oral airway Placement Confirmation: ETT inserted through vocal cords under direct vision,  positive ETCO2 and breath sounds checked- equal and bilateral Secured at: 23 cm Tube secured with: Tape Dental Injury: Teeth and Oropharynx as per pre-operative assessment

## 2015-03-19 NOTE — Anesthesia Postprocedure Evaluation (Signed)
  Anesthesia Post-op Note  Patient: Tyler [name]  Procedure(s) Performed: Procedure(s): LAPAROSCOPIC CHOLECYSTECTOMY LYSIS OF ADHESIONS WITH INTRAOPERATIVE CHOLANGIOGRAM (N/A)  Patient Location: PACU  Anesthesia Type:General  Level of Consciousness: awake and alert   Airway and Oxygen Therapy: Patient Spontanous Breathing  Post-op Pain: mild  Post-op Assessment: Post-op Vital signs reviewed  Post-op Vital Signs: Reviewed  Last Vitals:  Filed Vitals:   03/19/15 1348  BP: 141/59  Pulse: 68  Temp:   Resp: 18    Complications: No apparent anesthesia complications

## 2015-03-20 ENCOUNTER — Encounter (HOSPITAL_COMMUNITY): Payer: Self-pay | Admitting: Surgery

## 2015-03-20 DIAGNOSIS — K8012 Calculus of gallbladder with acute and chronic cholecystitis without obstruction: Secondary | ICD-10-CM | POA: Diagnosis not present

## 2015-03-20 LAB — CBC
HCT: 33.2 % — ABNORMAL LOW (ref 39.0–52.0)
HEMOGLOBIN: 10.8 g/dL — AB (ref 13.0–17.0)
MCH: 30.3 pg (ref 26.0–34.0)
MCHC: 32.5 g/dL (ref 30.0–36.0)
MCV: 93 fL (ref 78.0–100.0)
Platelets: 167 10*3/uL (ref 150–400)
RBC: 3.57 MIL/uL — AB (ref 4.22–5.81)
RDW: 13.7 % (ref 11.5–15.5)
WBC: 9 10*3/uL (ref 4.0–10.5)

## 2015-03-20 LAB — BASIC METABOLIC PANEL
Anion gap: 10 (ref 5–15)
BUN: 11 mg/dL (ref 6–20)
CO2: 28 mmol/L (ref 22–32)
Calcium: 9.4 mg/dL (ref 8.9–10.3)
Chloride: 100 mmol/L — ABNORMAL LOW (ref 101–111)
Creatinine, Ser: 0.78 mg/dL (ref 0.61–1.24)
GFR calc non Af Amer: >60 mL/min (ref >60)
Glucose, Bld: 129 mg/dL — ABNORMAL HIGH (ref 65–99)
POTASSIUM: 4.3 mmol/L (ref 3.5–5.1)
SODIUM: 138 mmol/L (ref 135–145)

## 2015-03-20 NOTE — Progress Notes (Signed)
Report called to [facility] facility.  Discharge instructions gone over with patient.  Packet to be given to sister Joycelyn Schmid at arrival to floor

## 2015-03-20 NOTE — Discharge Summary (Signed)
Physician Discharge Summary  Patient ID: Tyler [name] MRN: [id number] DOB/AGE: 04/06/1941 74 y.o.  Admit date: 03/19/2015 Discharge date: 03/20/2015  Admission Diagnoses: Chronic cholecystitis  Discharge Diagnoses:  Principal Problem:   Chronic cholecystitis with calculus s/p lap chole 03/19/2015   Discharged Condition: good  Hospital Course: Pt admitted overnight after cholecystectomy.    Consults: None  Significant Diagnostic Studies: labs: cbc  Treatments: IV hydration, analgesia: acetaminophen w/ codeine and surgery: lap cholecystectomy   Discharge Exam: Blood pressure 149/58, pulse 64, temperature 98.5 F (36.9 C), temperature source Oral, resp. rate 16, height '6\' 2"'$  (1.88 m), weight 98.884 kg (218 lb), SpO2 98 %. General appearance: alert and cooperative GI: normal findings: soft, non-tender Incision/Wound: clean, dry, intact  Disposition: 03-Skilled Nursing Facility  Discharge Instructions    Call MD for:  extreme fatigue    Complete by:  As directed      Call MD for:  extreme fatigue    Complete by:  As directed      Call MD for:  hives    Complete by:  As directed      Call MD for:  hives    Complete by:  As directed      Call MD for:  persistant nausea and vomiting    Complete by:  As directed      Call MD for:  persistant nausea and vomiting    Complete by:  As directed      Call MD for:  redness, tenderness, or signs of infection (pain, swelling, redness, odor or green/yellow discharge around incision site)    Complete by:  As directed      Call MD for:  redness, tenderness, or signs of infection (pain, swelling, redness, odor or green/yellow discharge around incision site)    Complete by:  As directed      Call MD for:  severe uncontrolled pain    Complete by:  As directed      Call MD for:  severe uncontrolled pain    Complete by:  As directed      Call MD for:    Complete by:  As directed   Temperature > 101.33F     Call MD for:    Complete by:   As directed   Temperature > 101.33F     Diet - low sodium heart healthy    Complete by:  As directed      Diet - low sodium heart healthy    Complete by:  As directed      Discharge instructions    Complete by:  As directed   Please see discharge instruction sheets.  Also refer to handout given an office.  Please call our office if you have any questions or concerns [phone]     Discharge instructions    Complete by:  As directed   Please see discharge instruction sheets.  Also refer to handout given an office.  Please call our office if you have any questions or concerns [phone]     Discharge wound care:    Complete by:  As directed   If you have closed incisions, shower and bathe over these incisions with soap and water every day.  Remove all surgical dressings on postoperative day #3.  You do not need to replace dressings over the closed incisions unless you feel more comfortable with a Band-Aid covering it.   If you have an open wound that requires packing, please see wound care  instructions.  In general, remove all dressings, wash wound with soap and water and then replace with saline moistened gauze.  Do the dressing change at least every day.  Please call our office [phone] if you have further questions.     Discharge wound care:    Complete by:  As directed   If you have closed incisions, shower and bathe over these incisions with soap and water every day.  Remove all surgical dressings on postoperative day #3.  You do not need to replace dressings over the closed incisions unless you feel more comfortable with a Band-Aid covering it.   If you have an open wound that requires packing, please see wound care instructions.  In general, remove all dressings, wash wound with soap and water and then replace with saline moistened gauze.  Do the dressing change at least every day.  Please call our office [phone] if you have further questions.     Driving Restrictions     Complete by:  As directed   No driving until off narcotics and can safely swerve away without pain during an emergency     Driving Restrictions    Complete by:  As directed   No driving until off narcotics and can safely swerve away without pain during an emergency     Increase activity slowly    Complete by:  As directed   Walk an hour a day.  Use 20-30 minute walks.  When you can walk 30 minutes without difficulty, increase to low impact/moderate activities such as biking, jogging, swimming, sexual activity..  Eventually can increase to unrestricted activity when not feeling pain.  If you feel pain: STOP!Marland Kitchen   Let pain protect you from overdoing it.  Use ice/heat/over-the-counter pain medications to help minimize his soreness.  Use pain prescriptions as needed to remain active.  It is better to take extra pain medications and be more active than to stay bedridden to avoid all pain medications.     Increase activity slowly    Complete by:  As directed   Walk an hour a day.  Use 20-30 minute walks.  When you can walk 30 minutes without difficulty, increase to low impact/moderate activities such as biking, jogging, swimming, sexual activity..  Eventually can increase to unrestricted activity when not feeling pain.  If you feel pain: STOP!Marland Kitchen   Let pain protect you from overdoing it.  Use ice/heat/over-the-counter pain medications to help minimize his soreness.  Use pain prescriptions as needed to remain active.  It is better to take extra pain medications and be more active than to stay bedridden to avoid all pain medications.     Lifting restrictions    Complete by:  As directed   Avoid heavy lifting initially.  Do not push through pain.  You have no specific weight limit.  Coughing and sneezing or four more stressful to your incision than any lifting you will do. Pain will protect you from injury.  Therefore, avoid intense activity until off all narcotic pain medications.  Coughing and sneezing or four  more stressful to your incision than any lifting he will do.     Lifting restrictions    Complete by:  As directed   Avoid heavy lifting initially.  Do not push through pain.  You have no specific weight limit.  Coughing and sneezing or four more stressful to your incision than any lifting you will do. Pain will protect you from injury.  Therefore, avoid intense activity until  off all narcotic pain medications.  Coughing and sneezing or four more stressful to your incision than any lifting he will do.     May shower / Bathe    Complete by:  As directed      May shower / Bathe    Complete by:  As directed      May walk up steps    Complete by:  As directed      May walk up steps    Complete by:  As directed      Sexual Activity Restrictions    Complete by:  As directed   Sexual activity as tolerated.  Do not push through pain.  Pain will protect you from injury.     Sexual Activity Restrictions    Complete by:  As directed   Sexual activity as tolerated.  Do not push through pain.  Pain will protect you from injury.     Walk with assistance    Complete by:  As directed   Walk over an hour a day.  May use a walker/cane/companion to help with balance and stamina.     Walk with assistance    Complete by:  As directed   Walk over an hour a day.  May use a walker/cane/companion to help with balance and stamina.            Medication List    TAKE these medications        albuterol 108 (90 BASE) MCG/ACT inhaler  Commonly known as:  PROVENTIL HFA;VENTOLIN HFA  Inhale 1 puff into the lungs every 6 (six) hours as needed for wheezing or shortness of breath.     albuterol (2.5 MG/3ML) 0.083% nebulizer solution  Commonly known as:  PROVENTIL  Take 2.5 mg by nebulization every 6 (six) hours as needed for wheezing or shortness of breath.     alum & mag hydroxide-simeth 200-200-20 MG/5ML suspension  Commonly known as:  MAALOX/MYLANTA  Take 30 mLs by mouth every 6 (six) hours as needed for  indigestion or heartburn (or bloating).     escitalopram 20 MG tablet  Commonly known as:  LEXAPRO  Take 20 mg by mouth daily.     FLOMAX 0.4 MG Caps capsule  Generic drug:  tamsulosin  Take 0.4 mg by mouth every morning.     fluticasone 50 MCG/ACT nasal spray  Commonly known as:  FLONASE  Place 1 spray into both nostrils daily.     folic acid 1 MG tablet  Commonly known as:  FOLVITE  Take 1 tablet (1 mg total) by mouth daily.     latanoprost 0.005 % ophthalmic solution  Commonly known as:  XALATAN  Place 1 drop into both eyes at bedtime.     metoprolol succinate 100 MG 24 hr tablet  Commonly known as:  TOPROL-XL  Take 100 mg by mouth daily. Take with or immediately following a meal.     ondansetron 8 MG tablet  Commonly known as:  ZOFRAN  Take 8 mg by mouth every 8 (eight) hours as needed for nausea or vomiting.     oxyCODONE 15 MG immediate release tablet  Commonly known as:  ROXICODONE  Take 1 tablet (15 mg total) by mouth every 4 (four) hours as needed for pain.     OXYGEN  Inhale 1-4 L/min into the lungs as directed. 1-4 L/M according to O2 sats;wanting O2 sats to range between 90-95 percent     pantoprazole 40 MG tablet  Commonly known as:  PROTONIX  Take 40 mg by mouth daily.           Follow-up Information    Follow up with GROSS,STEVEN C., MD In 2 weeks.   Specialty:  General Surgery   Why:  To follow up after your hospital stay, To follow up after your operation   Contact information:   [place] [address] [phone]       Signed: Rosario Adie 04/14/3793, 3:27 AM

## 2015-03-20 NOTE — Progress Notes (Signed)
1 Day Post-Op  Subjective: Pt did well overnight, no complaints  Objective: Vital signs in last 24 hours: Temp:  [97.5 F (36.4 C)-99 F (37.2 C)] 98.5 F (36.9 C) (05/20 0540) Pulse Rate:  [50-82] 64 (05/20 0540) Resp:  [14-21] 16 (05/20 0540) BP: (128-164)/(52-106) 149/58 mmHg (05/20 0540) SpO2:  [93 %-100 %] 98 % (05/20 0540) Weight:  [98.884 kg (218 lb)] 98.884 kg (218 lb) (05/19 0805)   Intake/Output from previous day: 05/19 0701 - 05/20 0700 In: 2774 [P.O.:1257; I.V.:2435] Out: 2775 [Urine:2775] Intake/Output this shift: Total I/O In: -  Out: 400 [Urine:400]   General appearance: alert and cooperative GI: normal findings: soft, non-tender  Incision: no significant drainage, no significant erythema  Lab Results:   Recent Labs  03/20/15 0440  WBC 9.0  HGB 10.8*  HCT 33.2*  PLT 167   BMET  Recent Labs  03/20/15 0440  NA 138  K 4.3  CL 100*  CO2 28  GLUCOSE 129*  BUN 11  CREATININE 0.78  CALCIUM 9.4   PT/INR No results for input(s): LABPROT, INR in the last 72 hours. ABG No results for input(s): PHART, HCO3 in the last 72 hours.  Invalid input(s): PCO2, PO2  MEDS, Scheduled . escitalopram  20 mg Oral Daily  . fluticasone  1 spray Each Nare Daily  . folic acid  1 mg Oral Daily  . heparin  5,000 Units Subcutaneous 3 times per day  . latanoprost  1 drop Both Eyes QHS  . lip balm  1 application Topical BID  . metoprolol succinate  100 mg Oral Daily  . pantoprazole  40 mg Oral Daily  . sodium chloride  3 mL Intravenous Q12H  . tamsulosin  0.4 mg Oral q morning - 10a    Studies/Results: Dg Cholangiogram Operative  03/19/2015   CLINICAL DATA:  Gallstones.  EXAM: INTRAOPERATIVE CHOLANGIOGRAM  TECHNIQUE: Cholangiographic images from the C-arm fluoroscopic device were submitted for interpretation post-operatively. Please see the procedural report for the amount of contrast and the fluoroscopy time utilized.  COMPARISON:  CT scan of January 06, 2015.   Fluoroscopy time:  18 seconds.  FINDINGS: Contrast was injected through cannulated cystic duct remnant. No extravasation or leakage is noted. No filling defect is noted in the common bile duct. Antegrade flow of contrast into duodenum is noted.  IMPRESSION: No common bile duct stones are noted.   Electronically Signed   By: Marijo Conception, M.D.   On: 03/19/2015 13:40    Assessment: s/p Procedure(s): LAPAROSCOPIC CHOLECYSTECTOMY LYSIS OF ADHESIONS WITH INTRAOPERATIVE CHOLANGIOGRAM Patient Active Problem List   Diagnosis Date Noted  . Chronic cholecystitis with calculus s/p lap chole 03/19/2015 03/19/2015  . Adenocarcinoma of lung, stage 3 01/02/2015  . Benign essential HTN 01/02/2015  . Malignant neoplasm of hilus of left lung 09/29/2014  . Neoplasm related pain 08/26/2014  . Constipation 08/26/2014  . Nausea without vomiting 08/26/2014  . Anorexia 08/26/2014  . Weight loss 08/26/2014  . Neoplastic malignant related fatigue 08/26/2014  . Other pancytopenia 08/26/2014  . Lung cancer 04/23/2014  . Chronic respiratory failure 11/09/2013  . Dyspnea 11/08/2013  . Acute delirium 10/12/2012  . Chest pain 05/23/2012  . Hypertension 05/23/2012  . COPD GOLD II/ restrictive component 02/09/2012    Expected post op course  Plan: Discharge to SNF       .Rosario Adie, [facility], [place]   03/20/2015 7:35 AM

## 2015-03-20 NOTE — Clinical Social Work Note (Signed)
Clinical Social Work Assessment  Patient Details  Name: Tyler [name] MRN: [id number] Date of Birth: 1941/02/12  Date of referral:  03/20/15               Reason for consult:  Discharge Planning                Permission sought to share information with:  Facility Sport and exercise psychologist Permission granted to share information::  Yes, Verbal Permission Granted  Name::     Tyler [name]  Agency::   [facility]  Relationship::  daughter  Contact Information:  [phone]  Housing/Transportation Living arrangements for the past 2 months:  [place] of Information:  Patient Patient Interpreter Needed:  None Criminal Activity/Legal Involvement Pertinent to Current Situation/Hospitalization:  No - Comment as needed Significant Relationships:  Adult Children Lives with:  Facility Resident Do you feel safe going back to the place where you live?  Yes Need for family participation in patient care:  No (Coment)  Care giving concerns:  Pt admitted from [facility] and medically ready to return to facility today.    Social Worker assessment / plan:  CSW received consult that pt admitted as observation overnight following surgery yesterday. Per RN, pt medically ready for discharge back to [facility] today and pt family plans to be at hospital at 10 am to transport pt.   CSW met with pt at bedside. CSW introduced self and explained role. Pt confirmed that he is a resident at [facility] and plans is to return today. Pt confirmed that pt family will provide transportation for pt to [facility]. CSW discussed with pt that RN has discharge packet that will be provided to pt when pt family arrives and pt needs to provide packet to [facility] upon return. Pt expressed understanding.   CSW contacted [facility] and notified of discharge. [facility] confirmed that pt can return to facility today. CSW faxed pt discharge summary via [facility]. Pt does not require FL2 because pt observation and has  been in hospital for less than 24 hours. CSW spoke with RN and provided phone number to call report and provided RN discharge packet to provide to pt and pt family upon discharge.   No further social work needs identified at this time.  CSW signing off.   Employment status:  Retired Nurse, adult PT Recommendations:  Not assessed at this time Information / Referral to community resources:  [place]  Patient/Family's Response to care:  Pt alert and oriented x 4. Pt eager for discharge back to [facility] today. Pt family not yet present at bedside and per RN plan to arrive at 10 am to transport pt back to [facility].   Patient/Family's Understanding of and Emotional Response to Diagnosis, Current Treatment, and Prognosis:  Pt knowledgeable about procedure performed and aware and coping appropriate regarding transition back to [facility] today.  Emotional Assessment Appearance:  Appears stated age Attitude/Demeanor/Rapport:  Other (pt approropriate attitude/demeanor/rapport) Affect (typically observed):  Pleasant Orientation:  Oriented to Self, Oriented to Place, Oriented to  Time, Oriented to Situation Alcohol / Substance use:  Not Applicable Psych involvement (Current and /or in the community):  No (Comment)  Discharge Needs  Concerns to be addressed:  Discharge Planning Concerns Readmission within the last 30 days:  No Current discharge risk:  None Barriers to Discharge:  No Barriers Identified  No further social work needs identified. Pt d/c to [facility]  via pt family private vehicle. CSW signing off.   Alison Murray A, LCSW 03/20/2015, 9:42 AM  [phone]

## 2015-03-26 ENCOUNTER — Other Ambulatory Visit: Payer: Medicare Other

## 2015-03-26 ENCOUNTER — Ambulatory Visit: Payer: Medicare Other | Admitting: Physician Assistant

## 2015-09-01 DEATH — deceased

## 2016-01-07 IMAGING — CR DG CHEST 2V
3 series · 3 of 3 positions shown · non-contrast
Comparison: Chest radiograph 01/02/2015.

CLINICAL DATA: Patient being treated for pneumonia. History of
hypertension.

EXAM:
CHEST  2 VIEW

[w chest lat (1 of 2)]
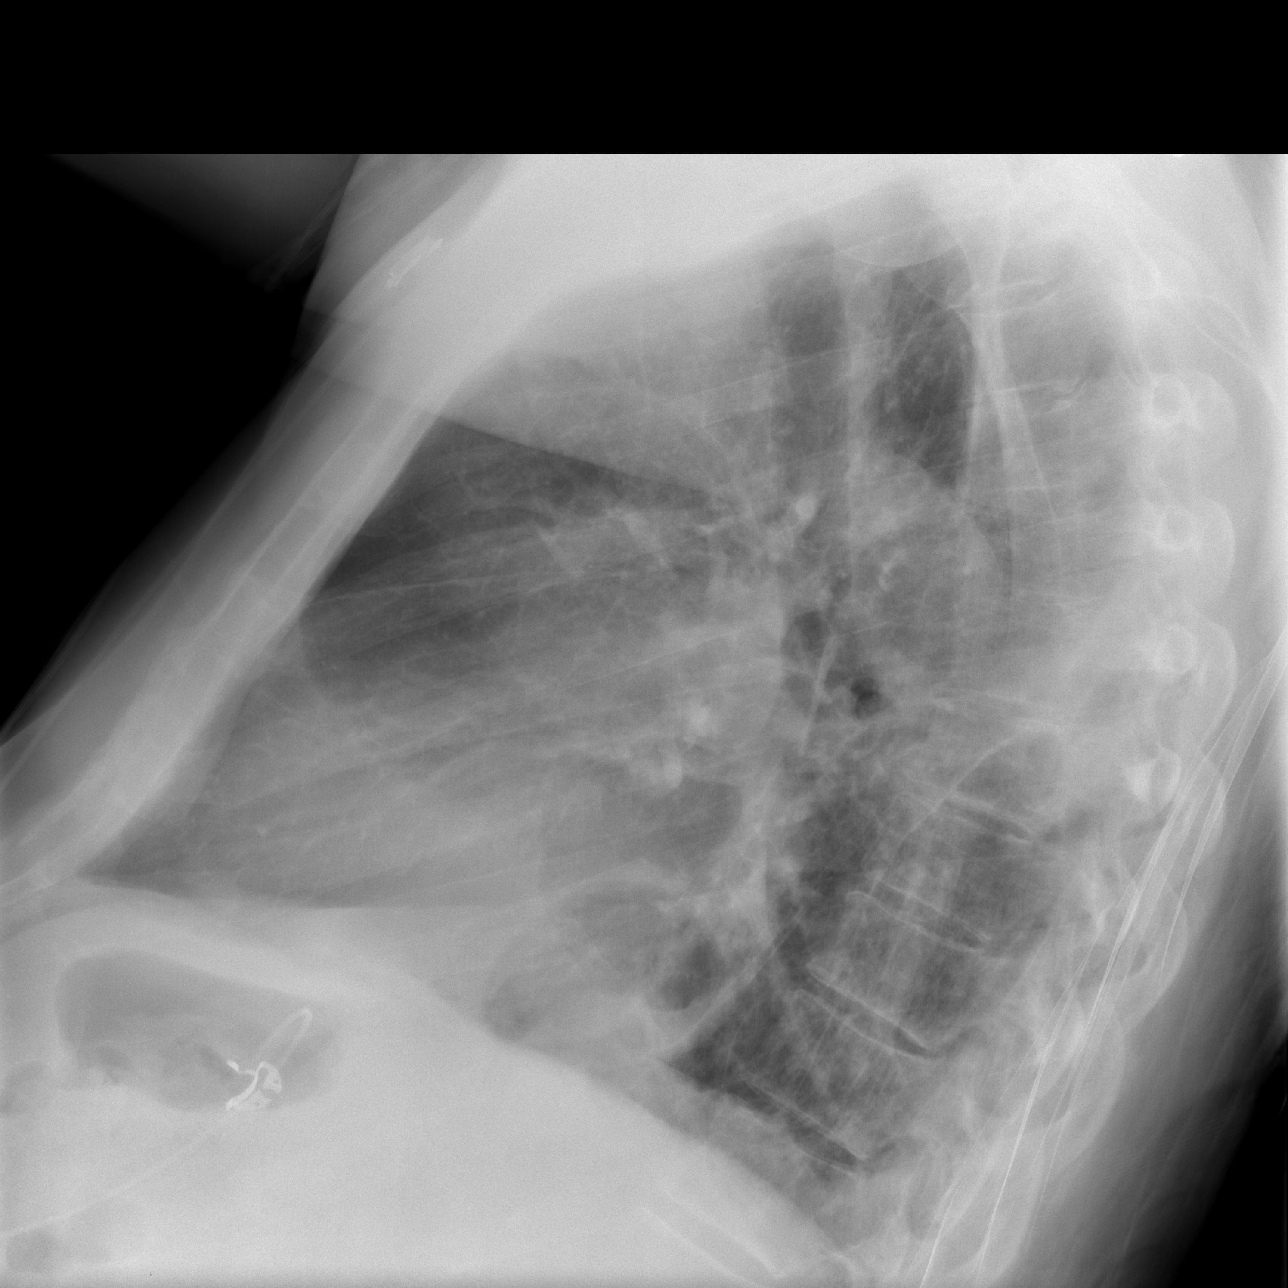

[w chest lat (2 of 2)]
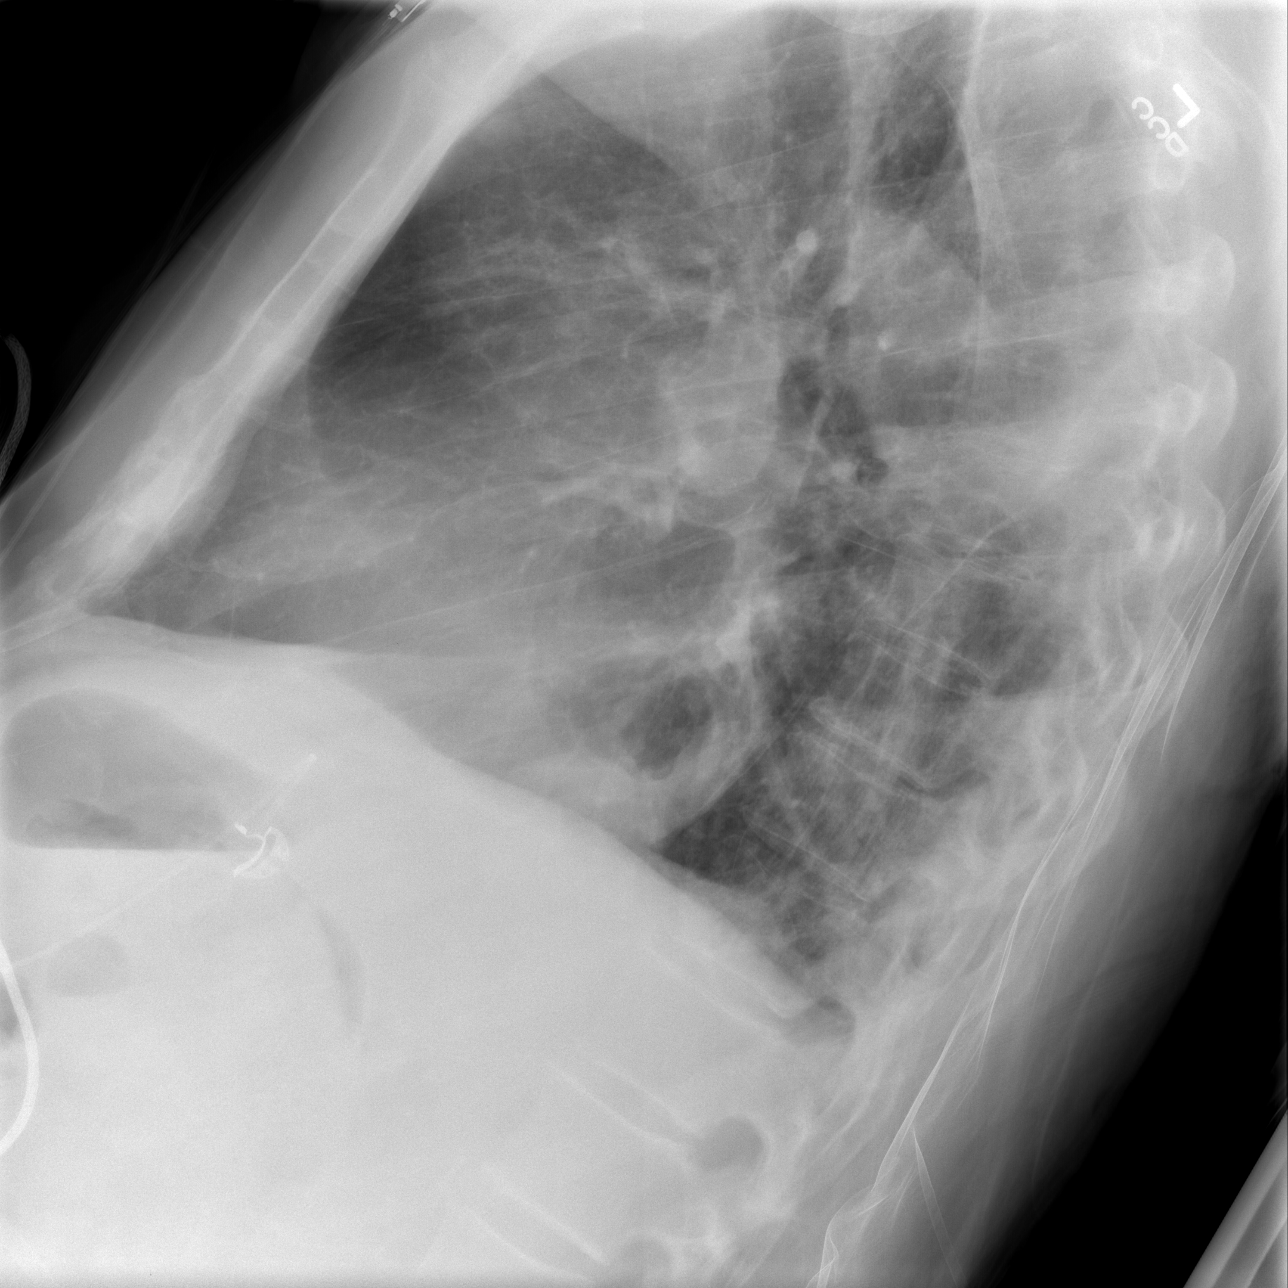

[view not recorded]
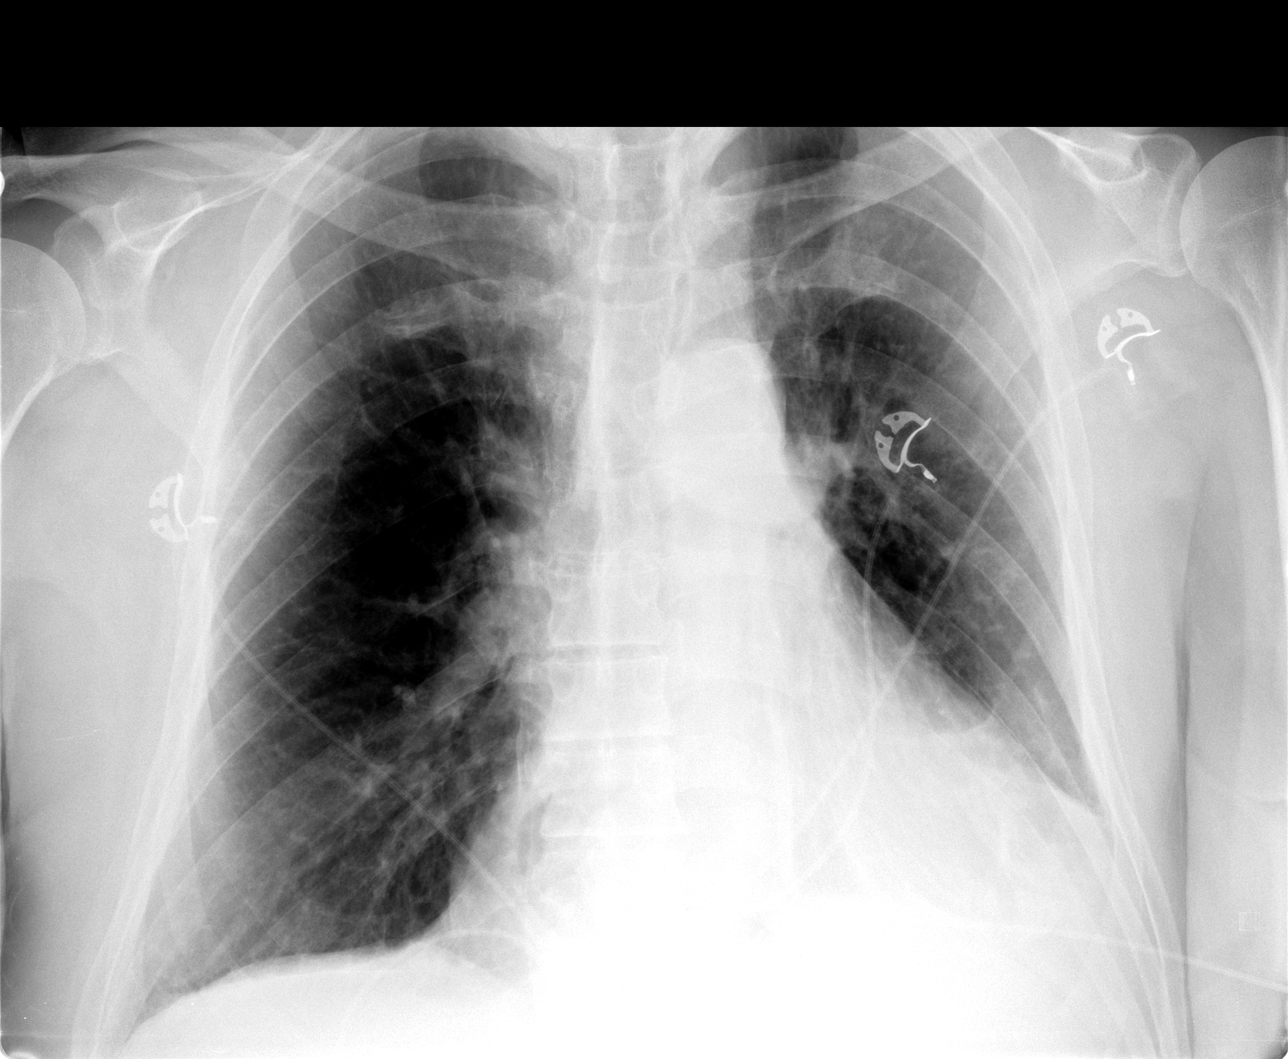

[3 of 3 positions shown; findings below may reference images not displayed]

FINDINGS: Multiple monitoring leads overlie the patient. Stable cardiomegaly.
Interval increase in left lower lobe heterogeneous opacities likely
secondary to known left lower lobe pulmonary mass as well as
adjacent pulmonary consolidation and left pleural effusion. Minimal
right basilar atelectasis. Degenerative changes mid thoracic spine.
Large hiatal hernia.
IMPRESSION: Increased heterogeneous opacities left lung base likely secondary to
known left lower lobe pulmonary mass, associated peripheral
heterogeneous opacities which may represent postobstructive
atelectasis or pneumonia. Additionally there is a small left pleural
effusion.

## 2016-01-08 IMAGING — XA IR CHOLECYSTOSTOMY
1 series · 4 of 4 positions shown · non-contrast
Comparison: CT of the abdomen on 01/06/2015

CLINICAL DATA: Acute calculus cholecystitis. The patient is not a
candidate for immediate cholecystectomy and requires placement of a
percutaneous cholecystostomy tube.

EXAM:
PERCUTANEOUS CHOLECYSTOSTOMY

[Series 300: sp perc cholecystostomy · 4 of 4 slices shown]
[im 1/4]
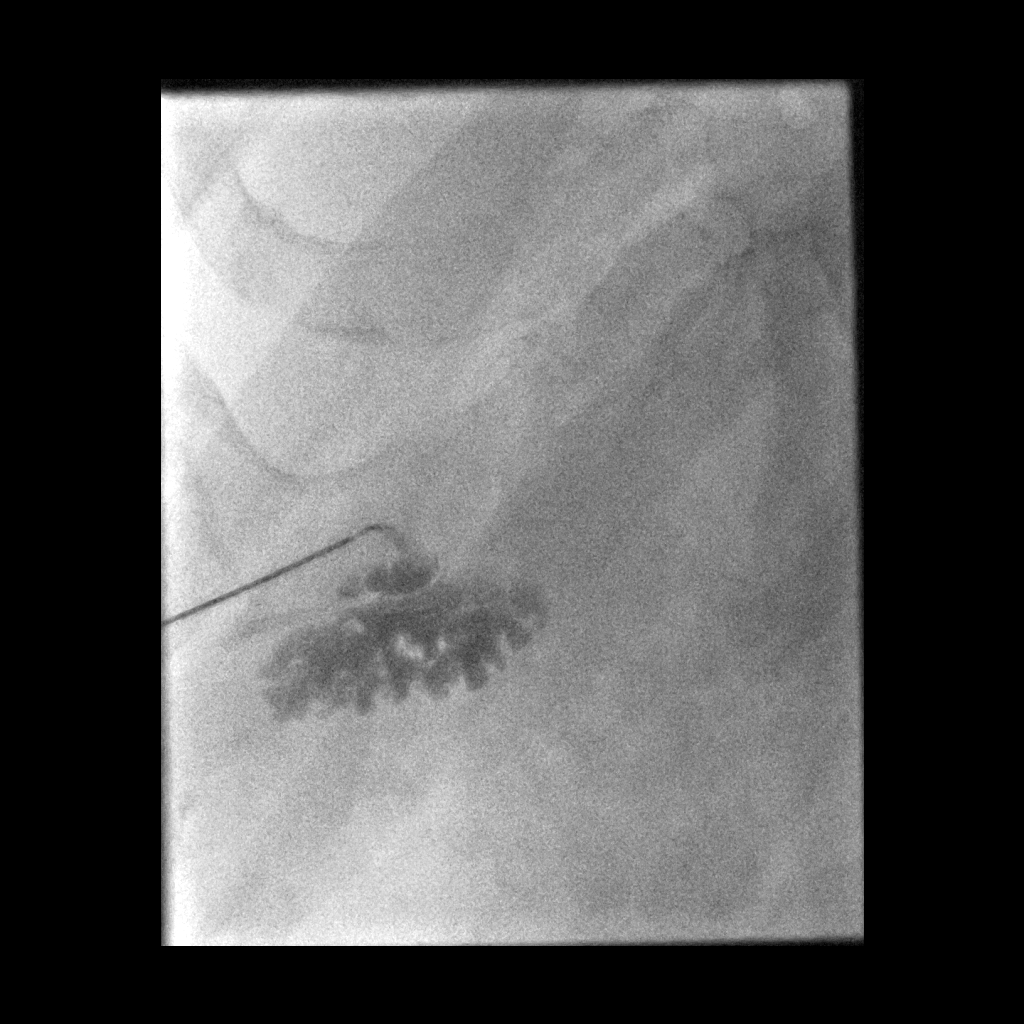
[im 2/4]
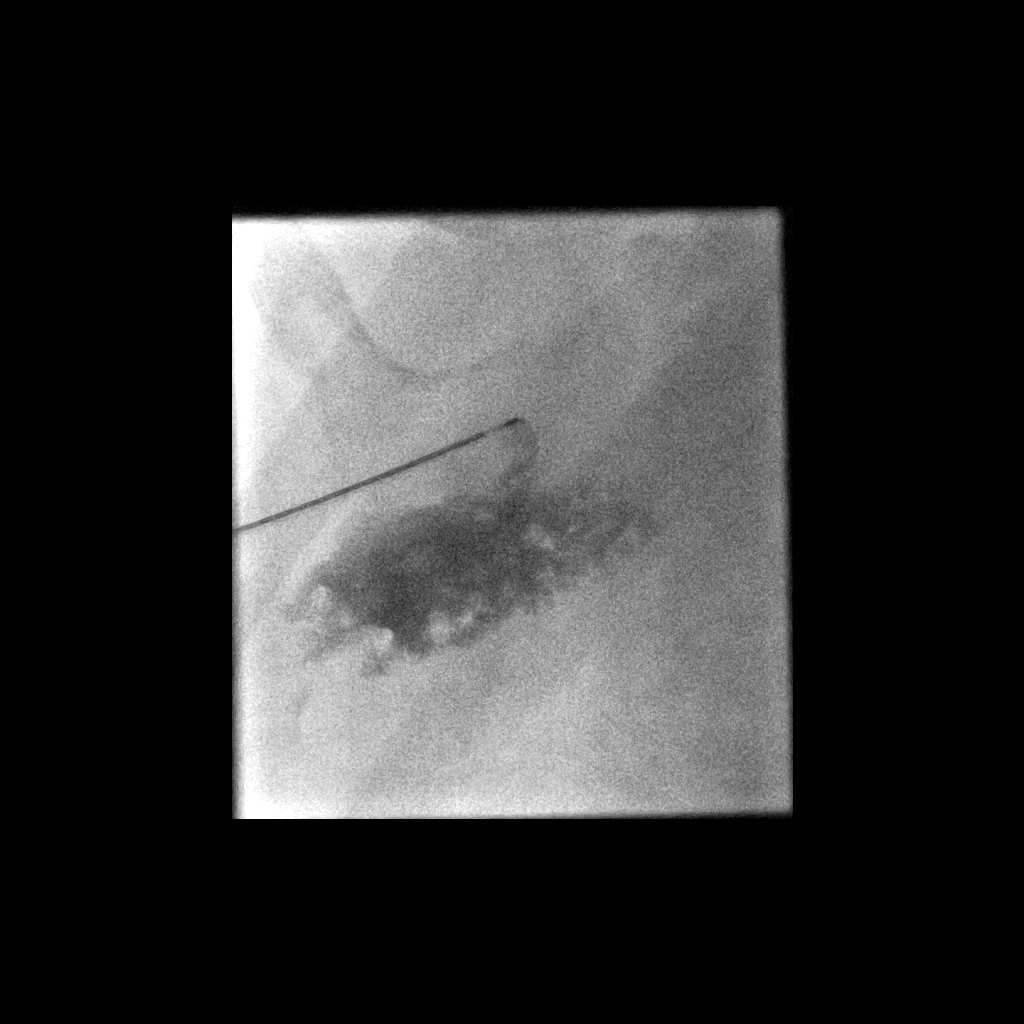
[im 3/4]
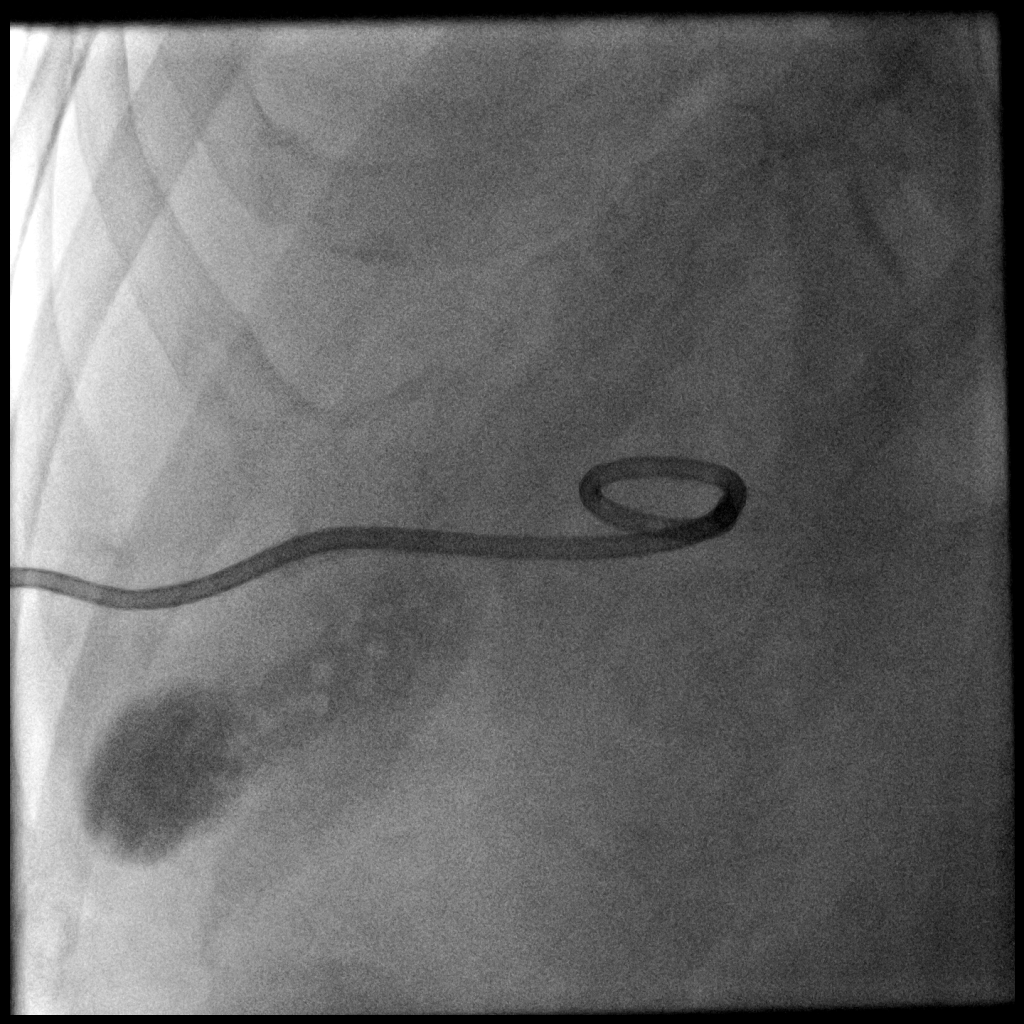
[im 4/4]
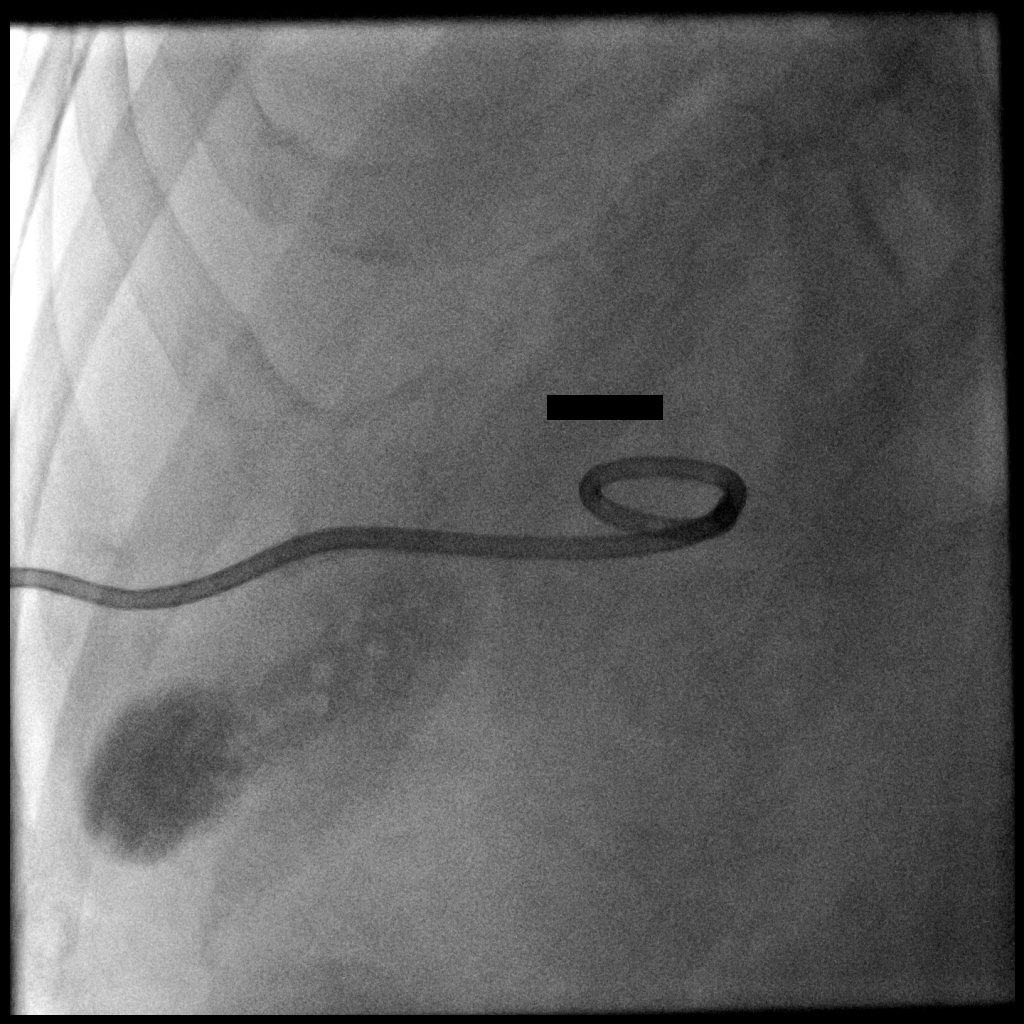

[4 of 4 positions shown; findings below may reference images not displayed]

ANESTHESIA/SEDATION:
2.0 mg IV Versed; 100 mcg IV Fentanyl.

Total Moderate Sedation Time

17 minutes

CONTRAST:  10mL OMNIPAQUE IOHEXOL 300 MG/ML  SOLN

MEDICATIONS:
A schedule dose of 3.375 g IV Zosyn was infusing during the
procedure.

FLUOROSCOPY TIME:  42 seconds.

PROCEDURE:
The procedure, risks, benefits, and alternatives were explained to
the patient. Questions regarding the procedure were encouraged and
answered. The patient understands and consents to the procedure. A
timeout was performed prior to the procedure.

The right abdominal wall was prepped with Betadine in a sterile
fashion, and a sterile drape was applied covering the operative
field. A sterile gown and sterile gloves were used for the
procedure. Local anesthesia was provided with 1% Lidocaine.

Ultrasound was utilized to localize the gallbladder. Under direct
ultrasound guidance, a 21 gauge needle was advanced via a
transhepatic approach into the gallbladder lumen. Ultrasound image
documentation was performed. Aspiration was performed and a bile
sample sent for culture studies. A small amount of diluted contrast
material was injected. A guide wire was then advanced into the
gallbladder. A transitional dilator was placed.

Percutaneous tract dilatation was then performed over a guide wire
to 10-French. A 10-French pigtail drainage catheter was then
advanced into the gallbladder lumen under fluoroscopy. Catheter was
formed and injected with contrast material to confirm position. The
catheter was flushed and connected to a gravity drainage bag. It was
secured at the skin with a Prolene retention suture and Stat-Lock
device.

COMPLICATIONS:
None
FINDINGS: After needle puncture of the gallbladder, a bile sample was
aspirated and sent for culture. The cholecystostomy tube was
advanced into the gallbladder lumen and formed. It is now draining
bile. This tube will be left to gravity drainage.
IMPRESSION: Percutaneous cholecystostomy with placement of 10-French drainage
catheter into the gallbladder lumen. This was left to gravity
drainage.

## 2016-10-27 ENCOUNTER — Other Ambulatory Visit: Payer: Self-pay | Admitting: Nurse Practitioner
